# Patient Record
Sex: Female | Born: 1965
Health system: Southern US, Community
[De-identification: ages and names within clinical notes are randomized; demographics above are authoritative.]

## PROBLEM LIST (undated history)

## (undated) DIAGNOSIS — G709 Myoneural disorder, unspecified: Secondary | ICD-10-CM

## (undated) DIAGNOSIS — J302 Other seasonal allergic rhinitis: Secondary | ICD-10-CM

## (undated) DIAGNOSIS — E11311 Type 2 diabetes mellitus with unspecified diabetic retinopathy with macular edema: Secondary | ICD-10-CM

## (undated) DIAGNOSIS — Z950 Presence of cardiac pacemaker: Secondary | ICD-10-CM

## (undated) DIAGNOSIS — Z9889 Other specified postprocedural states: Secondary | ICD-10-CM

## (undated) DIAGNOSIS — D649 Anemia, unspecified: Secondary | ICD-10-CM

## (undated) DIAGNOSIS — I219 Acute myocardial infarction, unspecified: Secondary | ICD-10-CM

## (undated) DIAGNOSIS — T4145XA Adverse effect of unspecified anesthetic, initial encounter: Secondary | ICD-10-CM

## (undated) DIAGNOSIS — K219 Gastro-esophageal reflux disease without esophagitis: Secondary | ICD-10-CM

## (undated) DIAGNOSIS — J189 Pneumonia, unspecified organism: Secondary | ICD-10-CM

## (undated) DIAGNOSIS — I509 Heart failure, unspecified: Secondary | ICD-10-CM

## (undated) DIAGNOSIS — J9601 Acute respiratory failure with hypoxia: Secondary | ICD-10-CM

## (undated) DIAGNOSIS — T8859XA Other complications of anesthesia, initial encounter: Secondary | ICD-10-CM

## (undated) DIAGNOSIS — N179 Acute kidney failure, unspecified: Secondary | ICD-10-CM

## (undated) DIAGNOSIS — I2510 Atherosclerotic heart disease of native coronary artery without angina pectoris: Secondary | ICD-10-CM

## (undated) DIAGNOSIS — I499 Cardiac arrhythmia, unspecified: Secondary | ICD-10-CM

## (undated) DIAGNOSIS — I10 Essential (primary) hypertension: Secondary | ICD-10-CM

## (undated) DIAGNOSIS — R079 Chest pain, unspecified: Secondary | ICD-10-CM

## (undated) HISTORY — PX: OTHER SURGICAL HISTORY: SHX169

## (undated) HISTORY — DX: Other seasonal allergic rhinitis: J30.2

## (undated) HISTORY — PX: SHOULDER ARTHROSCOPY W/ ROTATOR CUFF REPAIR: SHX2400

## (undated) HISTORY — PX: MAZE: SHX5063

## (undated) HISTORY — DX: Gastro-esophageal reflux disease without esophagitis: K21.9

## (undated) HISTORY — PX: KNEE SURGERY: SHX244

---

## 2006-02-02 ENCOUNTER — Other Ambulatory Visit: Admission: RE | Admit: 2006-02-02 | Discharge: 2006-02-02 | Payer: Self-pay | Admitting: Gynecology

## 2006-02-23 ENCOUNTER — Encounter: Admission: RE | Admit: 2006-02-23 | Discharge: 2006-02-23 | Payer: Self-pay | Admitting: Gynecology

## 2007-03-11 ENCOUNTER — Other Ambulatory Visit: Admission: RE | Admit: 2007-03-11 | Discharge: 2007-03-11 | Payer: Self-pay | Admitting: Gynecology

## 2007-09-26 HISTORY — PX: BACK SURGERY: SHX140

## 2008-02-27 ENCOUNTER — Ambulatory Visit (HOSPITAL_COMMUNITY): Admission: RE | Admit: 2008-02-27 | Discharge: 2008-02-28 | Payer: Self-pay | Admitting: Orthopedic Surgery

## 2010-10-19 ENCOUNTER — Other Ambulatory Visit: Payer: Self-pay | Admitting: Gynecology

## 2010-10-19 ENCOUNTER — Ambulatory Visit
Admission: RE | Admit: 2010-10-19 | Discharge: 2010-10-19 | Payer: Self-pay | Source: Home / Self Care | Attending: Gynecology | Admitting: Gynecology

## 2010-10-19 ENCOUNTER — Other Ambulatory Visit
Admission: RE | Admit: 2010-10-19 | Discharge: 2010-10-19 | Payer: Self-pay | Source: Home / Self Care | Admitting: Gynecology

## 2010-12-07 ENCOUNTER — Encounter (INDEPENDENT_AMBULATORY_CARE_PROVIDER_SITE_OTHER): Payer: BC Managed Care – PPO

## 2010-12-07 DIAGNOSIS — Z13820 Encounter for screening for osteoporosis: Secondary | ICD-10-CM

## 2010-12-14 ENCOUNTER — Other Ambulatory Visit (INDEPENDENT_AMBULATORY_CARE_PROVIDER_SITE_OTHER): Payer: BC Managed Care – PPO

## 2010-12-14 DIAGNOSIS — E559 Vitamin D deficiency, unspecified: Secondary | ICD-10-CM

## 2010-12-21 ENCOUNTER — Ambulatory Visit (INDEPENDENT_AMBULATORY_CARE_PROVIDER_SITE_OTHER): Payer: BC Managed Care – PPO | Admitting: Gynecology

## 2010-12-21 DIAGNOSIS — M949 Disorder of cartilage, unspecified: Secondary | ICD-10-CM

## 2010-12-21 DIAGNOSIS — M899 Disorder of bone, unspecified: Secondary | ICD-10-CM

## 2010-12-21 DIAGNOSIS — R5381 Other malaise: Secondary | ICD-10-CM

## 2010-12-21 DIAGNOSIS — R5383 Other fatigue: Secondary | ICD-10-CM

## 2010-12-21 DIAGNOSIS — N951 Menopausal and female climacteric states: Secondary | ICD-10-CM

## 2011-01-09 ENCOUNTER — Other Ambulatory Visit: Payer: BC Managed Care – PPO

## 2011-02-07 NOTE — Op Note (Signed)
NAME:  Colleen [name], Colleen [name]              ACCOUNT NO.:  [id number]   MEDICAL RECORD NO.:  [id number]          PATIENT TYPE:  AMB   LOCATION:  DAY                          FACILITY:  [facility]   PHYSICIAN:  Ronald A. Gioffre, M.D.DATE OF BIRTH:  Oct 27, 1965   DATE OF PROCEDURE:  02/27/2008  DATE OF DISCHARGE:                               OPERATIVE REPORT   SURGEON:  Dr. Darrelyn Hillock.   ASSISTANT:  Dr. Marlowe Kays.   PREOPERATIVE DIAGNOSES:  Large herniated lumbar disk at L4-5 on the left  with a partial foot drop.   POSTOPERATIVE DIAGNOSES:  Large herniated lumbar disk at L4-5 on the  left with a partial foot drop.   OPERATION:  1. Hemilaminectomy and microdiskectomy at L4-5 on the left.  2. Foraminotomy of the L4-L5 roots on the left.   DESCRIPTION OF PROCEDURE:  Under general anesthesia, the patient first  had 1 gm of IV Ancef.  She was placed on the spinal frame.  A routine  orthopedic prep and draping of the back was carried out.  Two needles  were placed in the back for localization purposes.  An x-ray was taken  to verify the position.  An incision then was made over the L4-5 space.  Bleeders were identified and cauterized.  I then stripped the muscle  from the lamina bilaterally at L4-5.  I then took another x-ray with the  instruments in place to verify the exact position.  Once we established  the L4-5 space on the left, I then utilized a curette to curette off the  soft tissue off the lamina of 4-5.  The interlaminar space was  identified.  We then carried out our hemilaminectomy in the usual  fashion.  A microscope was brought in, and then we removed the  ligamentum flavum with great care taken to protect the underlying dura.  We then carried out foraminotomies on the left above and below the space  for 4-5 root.  We identified the L5 root and gently retracted it.  It  was under tension from the herniated disk.  We cauterized the lateral  recess veins in this area.  We then made  a cruciate incision in the disk  space and the disk literally exploded out through the incision site.  We  then removed that large piece of disk, and we then went into the space.  We compressed the remaining herniated disk material from the  subligamentous space.  We utilized the Epstein curettes and a nerve  hook.  We then completed our diskectomy in the usual fashion.  We  explored the area and made sure there were no other fragments, there  were not.  The axillary region of the root was fine.  The root and dura  was freely movable.  We made sure we had nice openings in the foramina  for the 4-5 roots.  We were able to easily pass a hockey-stick out the  foramina of both roots on the left.  We thoroughly irrigated out the  area, removed the fluid, loosely applied some thrombin-  soaked Gelfoam and closed the wound  in layers in the usual fashion.  I  left a small deep proximal part of the wound open for drainage purposes.  The subcu was closed with 0 Vicryl.  The skin was closed with metal  staples.  A sterile Neosporin dressing was applied.  She left the  operative room in satisfactory condition.           ______________________________  Georges Lynch Darrelyn Hillock, M.D.     RAG/MEDQ  D:  02/27/2008  T:  02/27/2008  Job:  [id number]   cc:   Alfonse Alpers. Dagoberto Ligas, M.D.  Fax: [phone]

## 2011-06-22 LAB — DIFFERENTIAL
Basophils Absolute: 0
Basophils Relative: 1
Eosinophils Absolute: 0.1
Lymphocytes Relative: 32
Monocytes Absolute: 0.4
Monocytes Relative: 10

## 2011-06-22 LAB — CBC
HCT: 40.6
Platelets: 172
RBC: 4.72

## 2011-06-22 LAB — APTT: aPTT: 29

## 2011-06-22 LAB — COMPREHENSIVE METABOLIC PANEL
ALT: 22
AST: 21
Albumin: 3.6
Alkaline Phosphatase: 120 — ABNORMAL HIGH
Creatinine, Ser: 0.73
GFR calc Af Amer: >60
GFR calc non Af Amer: >60
Sodium: 137
Total Protein: 5.9 — ABNORMAL LOW

## 2011-06-22 LAB — URINALYSIS, ROUTINE W REFLEX MICROSCOPIC
Ketones, ur: NEGATIVE
Specific Gravity, Urine: 1.011

## 2012-01-01 ENCOUNTER — Other Ambulatory Visit: Payer: Self-pay | Admitting: *Deleted

## 2012-01-05 ENCOUNTER — Encounter: Payer: Self-pay | Admitting: *Deleted

## 2012-01-05 NOTE — Progress Notes (Signed)
Patient ID: Colleen [name], female   DOB: 28-Aug-1966, 46 y.o.   MRN: [id number] Pharmacy faxed a request for 90 day supply of Vagifem .  Called pharmacy and the patient answered.  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  Told her that her annual was overdue since January and did have an appt but we could not approve a 90 day rx.  We could approve #[id number] that was on the refill request but she claims the insurance would only pay for a 90 day supply and she would wait on rx till she see's Dr. Lily Peer for her annual in a couple weeks.

## 2012-01-08 ENCOUNTER — Other Ambulatory Visit: Payer: Self-pay | Admitting: *Deleted

## 2012-01-08 MED ORDER — ESTRADIOL 10 MCG VA TABS: ORAL_TABLET | VAGINAL | Status: DC

## 2012-01-09 DIAGNOSIS — J45909 Unspecified asthma, uncomplicated: Secondary | ICD-10-CM | POA: Insufficient documentation

## 2012-01-17 ENCOUNTER — Encounter: Payer: Self-pay | Admitting: Gynecology

## 2012-01-17 ENCOUNTER — Ambulatory Visit (INDEPENDENT_AMBULATORY_CARE_PROVIDER_SITE_OTHER): Payer: BC Managed Care – PPO | Admitting: Gynecology

## 2012-01-17 VITALS — BP 128/86 | Ht 63.0 in | Wt 156.0 lb

## 2012-01-17 DIAGNOSIS — N952 Postmenopausal atrophic vaginitis: Secondary | ICD-10-CM

## 2012-01-17 DIAGNOSIS — Z01419 Encounter for gynecological examination (general) (routine) without abnormal findings: Secondary | ICD-10-CM

## 2012-01-17 DIAGNOSIS — K219 Gastro-esophageal reflux disease without esophagitis: Secondary | ICD-10-CM | POA: Insufficient documentation

## 2012-01-17 DIAGNOSIS — R634 Abnormal weight loss: Secondary | ICD-10-CM

## 2012-01-17 DIAGNOSIS — E28319 Asymptomatic premature menopause: Secondary | ICD-10-CM | POA: Insufficient documentation

## 2012-01-17 DIAGNOSIS — E8889 Other specified metabolic disorders: Secondary | ICD-10-CM

## 2012-01-17 DIAGNOSIS — N951 Menopausal and female climacteric states: Secondary | ICD-10-CM

## 2012-01-17 DIAGNOSIS — E119 Type 2 diabetes mellitus without complications: Secondary | ICD-10-CM

## 2012-01-17 MED ORDER — ESTRADIOL 10 MCG VA TABS: ORAL_TABLET | VAGINAL | Status: DC

## 2012-01-17 MED ORDER — NONFORMULARY OR COMPOUNDED ITEM: Status: DC

## 2012-01-17 NOTE — Progress Notes (Signed)
Colleen [name] April 02, 1966 [id number]   History:    46 y.o.  for annual exam with history of premature menopause who had been on Vagifem 10 mcg intravaginally 3 times a week. Patient is a type II diabetic is being followed by Dr. Lurene [name]. Her last mammogram was in 2007 and she does her monthly self breast examinations. Review of her record indicates she was weighing 166 is now down to 156. No lab work drawn today since her primary physician has been drawn her lab work.  Past medical history,surgical history, family history and social history were all reviewed and documented in the EPIC chart.  Gynecologic History No LMP recorded. Patient is postmenopausal. Contraception: none Last Pap: 2012. Results were: normal Last mammogram: 2007. Results were: normal  Obstetric History OB History    Grav Para Term Preterm Abortions TAB SAB Ect Mult Living   2 2 2       2      # Outc Date GA Lbr Len/2nd Wgt Sex Del Anes PTL Lv   1 TRM     F CS  No Yes   2 TRM     M CS  No Yes       ROS:  Was performed and pertinent positives and negatives are included in the history.  Exam: chaperone present  BP 128/86  Ht 5\' 3"  (1.6 m)  Wt 156 lb (70.761 kg)  BMI 27.63 kg/m2  Body mass index is 27.63 kg/(m^2).  General appearance : Well developed well nourished female. No acute distress HEENT: Neck supple, trachea midline, no carotid bruits, no thyroidmegaly Lungs: Clear to auscultation, no rhonchi or wheezes, or rib retractions  Heart: Regular rate and rhythm, no murmurs or gallops Breast:Examined in sitting and supine position were symmetrical in appearance, no palpable masses or tenderness,  no skin retraction, no nipple inversion, no nipple discharge, no skin discoloration, no axillary or supraclavicular lymphadenopathy Abdomen: no palpable masses or tenderness, no rebound or guarding Extremities: no edema or skin discoloration or tenderness  Pelvic:  Bartholin, Urethra, Skene Glands: Within normal  limits             Vagina: No gross lesions or discharge, atrophy  Cervix: No gross lesions or discharge  Uterus  anteverted, normal size, shape and consistency, non-tender and mobile  Adnexa  Without masses or tenderness  Anus and perineum  normal   Rectovaginal  normal sphincter tone without palpated masses or tenderness             Hemoccult not done normal rectal exam     Assessment/Plan:  46 y.o. female for annual exam who does not want to be on oral hormone replacement therapy because of her diabetes. We discussed a transdermal route like she had been in the past or vaginal rings with the addition of Prometrium 10 days of the month. We had offered her and prescribed generic estrogen cream (estradiol 0.02%) 1 mL. She'll apply the applicator twice a week which will be made at the compound pharmacy. We'll see how this helps with her symptoms. She was given a requisition to schedule her mammogram which is overdue. She was encouraged to do her monthly self breast examinations. We discussed the new screening guidelines her Pap smears and she will not need 15for 2 more years. We discussed importance of nutrition and exercise and literature information was provided.   Ok Edwards MD, 12:06 PM 01/17/2012

## 2012-01-17 NOTE — Patient Instructions (Signed)
Exercise to Lose Weight Exercise and a healthy diet may help you lose weight. Your doctor may suggest specific exercises. EXERCISE IDEAS AND TIPS  Choose low-cost things you enjoy doing, such as walking, bicycling, or exercising to workout videos.   Take stairs instead of the elevator.   Walk during your lunch break.   Park your car further away from work or school.   Go to a gym or an exercise class.   Start with 5 to 10 minutes of exercise each day. Build up to 30 minutes of exercise 4 to 6 days a week.   Wear shoes with good support and comfortable clothes.   Stretch before and after working out.   Work out until you breathe harder and your heart beats faster.   Drink extra water when you exercise.   Do not do so much that you hurt yourself, feel dizzy, or get very short of breath.  Exercises that burn about 150 calories:  Running 1  miles in 15 minutes.   Playing volleyball for 45 to 60 minutes.   Washing and waxing a car for 45 to 60 minutes.   Playing touch football for 45 minutes.   Walking 1  miles in 35 minutes.   Pushing a stroller 1  miles in 30 minutes.   Playing basketball for 30 minutes.   Raking leaves for 30 minutes.   Bicycling 5 miles in 30 minutes.   Walking 2 miles in 30 minutes.   Dancing for 30 minutes.   Shoveling snow for 15 minutes.   Swimming laps for 20 minutes.   Walking up stairs for 15 minutes.   Bicycling 4 miles in 15 minutes.   Gardening for 30 to 45 minutes.   Jumping rope for 15 minutes.   Washing windows or floors for 45 to 60 minutes.  Document Released: 10/14/2010 Document Revised: 05/24/2011 Document Reviewed: 10/14/2010 Exit[employer] Patient Information 2012 [employer].                                                  Cholesterol Control Diet  Cholesterol levels in your body are determined significantly by your diet. Cholesterol levels may also be related to heart disease. The following material helps to  explain this relationship and discusses what you can do to help keep your heart healthy. Not all cholesterol is bad. Low-density lipoprotein (LDL) cholesterol is the "bad" cholesterol. It may cause fatty deposits to build up inside your arteries. High-density lipoprotein (HDL) cholesterol is "good." It helps to remove the "bad" LDL cholesterol from your blood. Cholesterol is a very important risk factor for heart disease. Other risk factors are high blood pressure, smoking, stress, heredity, and weight. The heart muscle gets its supply of blood through the coronary arteries. If your LDL cholesterol is high and your HDL cholesterol is low, you are at risk for having fatty deposits build up in your coronary arteries. This leaves less room through which blood can flow. Without sufficient blood and oxygen, the heart muscle cannot function properly and you may feel chest pains (angina pectoris). When a coronary artery closes up entirely, a part of the heart muscle may die, causing a heart attack (myocardial infarction). CHECKING CHOLESTEROL When your caregiver sends your blood to a lab to be analyzed for cholesterol, a complete lipid (fat) profile may be done. With   this test, the total amount of cholesterol and levels of LDL and HDL are determined. Triglycerides are a type of fat that circulates in the blood and can also be used to determine heart disease risk. The list below describes what the numbers should be: Test: Total Cholesterol.  Less than 200 mg/dl.  Test: LDL "bad cholesterol."  Less than 100 mg/dl.   Less than 70 mg/dl if you are at very high risk of a heart attack or sudden cardiac death.  Test: HDL "good cholesterol."  Greater than 50 mg/dl for women.   Greater than 40 mg/dl for men.  Test: Triglycerides.  Less than 150 mg/dl.  CONTROLLING CHOLESTEROL WITH DIET Although exercise and lifestyle factors are important, your diet is key. That is because certain foods are known to raise  cholesterol and others to lower it. The goal is to balance foods for their effect on cholesterol and more importantly, to replace saturated and trans fat with other types of fat, such as monounsaturated fat, polyunsaturated fat, and omega-3 fatty acids. On average, a person should consume no more than 15 to 17 g of saturated fat daily. Saturated and trans fats are considered "bad" fats, and they will raise LDL cholesterol. Saturated fats are primarily found in animal products such as meats, butter, and cream. However, that does not mean you need to sacrifice all your favorite foods. Today, there are good tasting, low-fat, low-cholesterol substitutes for most of the things you like to eat. Choose low-fat or nonfat alternatives. Choose round or loin cuts of red meat, since these types of cuts are lowest in fat and cholesterol. Chicken (without the skin), fish, veal, and ground turkey breast are excellent choices. Eliminate fatty meats, such as hot dogs and salami. Even shellfish have little or no saturated fat. Have a 3 oz (85 g) portion when you eat lean meat, poultry, or fish. Trans fats are also called "partially hydrogenated oils." They are oils that have been scientifically manipulated so that they are solid at room temperature resulting in a longer shelf life and improved taste and texture of foods in which they are added. Trans fats are found in stick margarine, some tub margarines, cookies, crackers, and baked goods.  When baking and cooking, oils are an excellent substitute for butter. The monounsaturated oils are especially beneficial since it is believed they lower LDL and raise HDL. The oils you should avoid entirely are saturated tropical oils, such as coconut and palm.  Remember to eat liberally from food groups that are naturally free of saturated and trans fat, including fish, fruit, vegetables, beans, grains (barley, rice, couscous, bulgur wheat), and pasta (without cream sauces).  IDENTIFYING  FOODS THAT LOWER CHOLESTEROL  Soluble fiber may lower your cholesterol. This type of fiber is found in fruits such as apples, vegetables such as broccoli, potatoes, and carrots, legumes such as beans, peas, and lentils, and grains such as barley. Foods fortified with plant sterols (phytosterol) may also lower cholesterol. You should eat at least 2 g per day of these foods for a cholesterol lowering effect.  Read package labels to identify low-saturated fats, trans fats free, and low-fat foods at the supermarket. Select cheeses that have only 2 to 3 g saturated fat per ounce. Use a heart-healthy tub margarine that is free of trans fats or partially hydrogenated oil. When buying baked goods (cookies, crackers), avoid partially hydrogenated oils. Breads and muffins should be made from whole grains (whole-wheat or whole oat flour, instead of "flour" or "  enriched flour"). Buy non-creamy canned soups with reduced salt and no added fats.  FOOD PREPARATION TECHNIQUES  Never deep-fry. If you must fry, either stir-fry, which uses very little fat, or use non-stick cooking sprays. When possible, broil, bake, or roast meats, and steam vegetables. Instead of dressing vegetables with butter or margarine, use lemon and herbs, applesauce and cinnamon (for squash and sweet potatoes), nonfat yogurt, salsa, and low-fat dressings for salads.  LOW-SATURATED FAT / LOW-FAT FOOD SUBSTITUTES Meats / Saturated Fat (g)  Avoid: Steak, marbled (3 oz/85 g) / 11 g   Choose: Steak, lean (3 oz/85 g) / 4 g   Avoid: Hamburger (3 oz/85 g) / 7 g   Choose: Hamburger, lean (3 oz/85 g) / 5 g   Avoid: Ham (3 oz/85 g) / 6 g   Choose: Ham, lean cut (3 oz/85 g) / 2.4 g   Avoid: Chicken, with skin, dark meat (3 oz/85 g) / 4 g   Choose: Chicken, skin removed, dark meat (3 oz/85 g) / 2 g   Avoid: Chicken, with skin, light meat (3 oz/85 g) / 2.5 g   Choose: Chicken, skin removed, light meat (3 oz/85 g) / 1 g  Dairy / Saturated Fat  (g)  Avoid: Whole milk (1 cup) / 5 g   Choose: Low-fat milk, 2% (1 cup) / 3 g   Choose: Low-fat milk, 1% (1 cup) / 1.5 g   Choose: Skim milk (1 cup) / 0.3 g   Avoid: Hard cheese (1 oz/28 g) / 6 g   Choose: Skim milk cheese (1 oz/28 g) / 2 to 3 g   Avoid: Cottage cheese, 4% fat (1 cup) / 6.5 g   Choose: Low-fat cottage cheese, 1% fat (1 cup) / 1.5 g   Avoid: Ice cream (1 cup) / 9 g   Choose: Sherbet (1 cup) / 2.5 g   Choose: Nonfat frozen yogurt (1 cup) / 0.3 g   Choose: Frozen fruit bar / trace   Avoid: Whipped cream (1 tbs) / 3.5 g   Choose: Nondairy whipped topping (1 tbs) / 1 g  Condiments / Saturated Fat (g)  Avoid: Mayonnaise (1 tbs) / 2 g   Choose: Low-fat mayonnaise (1 tbs) / 1 g   Avoid: Butter (1 tbs) / 7 g   Choose: Extra light margarine (1 tbs) / 1 g   Avoid: Coconut oil (1 tbs) / 11.8 g   Choose: Olive oil (1 tbs) / 1.8 g   Choose: Corn oil (1 tbs) / 1.7 g   Choose: Safflower oil (1 tbs) / 1.2 g   Choose: Sunflower oil (1 tbs) / 1.4 g   Choose: Soybean oil (1 tbs) / 2.4 g   Choose: Canola oil (1 tbs) / 1 g  Document Released: 09/11/2005 Document Revised: 05/24/2011 Document Reviewed: 03/02/2011 [employer] Patient Information 2012 [employer].   

## 2013-11-06 ENCOUNTER — Other Ambulatory Visit: Payer: Self-pay | Admitting: Endocrinology

## 2013-11-06 DIAGNOSIS — E049 Nontoxic goiter, unspecified: Secondary | ICD-10-CM

## 2013-11-21 ENCOUNTER — Ambulatory Visit
Admission: RE | Admit: 2013-11-21 | Discharge: 2013-11-21 | Disposition: A | Discharge status: Home / Self Care | Payer: 59 | Source: Ambulatory Visit | Attending: Endocrinology | Admitting: Endocrinology

## 2013-11-21 DIAGNOSIS — E049 Nontoxic goiter, unspecified: Secondary | ICD-10-CM

## 2014-07-27 ENCOUNTER — Encounter: Payer: Self-pay | Admitting: Gynecology

## 2014-09-14 ENCOUNTER — Encounter (HOSPITAL_BASED_OUTPATIENT_CLINIC_OR_DEPARTMENT_OTHER): Payer: 59 | Attending: Plastic Surgery

## 2015-07-08 ENCOUNTER — Other Ambulatory Visit: Payer: Self-pay | Admitting: Podiatry

## 2015-07-08 DIAGNOSIS — E1129 Type 2 diabetes mellitus with other diabetic kidney complication: Secondary | ICD-10-CM

## 2015-07-08 DIAGNOSIS — L97519 Non-pressure chronic ulcer of other part of right foot with unspecified severity: Secondary | ICD-10-CM

## 2015-07-08 DIAGNOSIS — M21961 Unspecified acquired deformity of right lower leg: Secondary | ICD-10-CM

## 2015-07-08 DIAGNOSIS — L03115 Cellulitis of right lower limb: Secondary | ICD-10-CM

## 2015-07-09 ENCOUNTER — Ambulatory Visit
Admission: RE | Admit: 2015-07-09 | Discharge: 2015-07-09 | Disposition: A | Discharge status: Home / Self Care | Payer: 59 | Source: Ambulatory Visit | Attending: Podiatry | Admitting: Podiatry

## 2015-07-09 DIAGNOSIS — E1129 Type 2 diabetes mellitus with other diabetic kidney complication: Secondary | ICD-10-CM

## 2015-07-09 DIAGNOSIS — M21961 Unspecified acquired deformity of right lower leg: Secondary | ICD-10-CM

## 2015-07-09 DIAGNOSIS — L03115 Cellulitis of right lower limb: Secondary | ICD-10-CM

## 2015-07-09 DIAGNOSIS — L97519 Non-pressure chronic ulcer of other part of right foot with unspecified severity: Secondary | ICD-10-CM

## 2015-07-09 MED ORDER — GADOBENATE DIMEGLUMINE 529 MG/ML IV SOLN
10.0000 mL | Freq: Once | INTRAVENOUS | Status: AC | PRN
  Administered 2015-07-09: 10 mL via INTRAVENOUS

## 2016-01-04 ENCOUNTER — Ambulatory Visit (HOSPITAL_COMMUNITY)
Admission: RE | Admit: 2016-01-04 | Discharge: 2016-01-04 | Disposition: A | Discharge status: Home / Self Care | Payer: 59 | Source: Ambulatory Visit | Attending: Podiatry | Admitting: Podiatry

## 2016-01-04 ENCOUNTER — Other Ambulatory Visit (HOSPITAL_COMMUNITY): Payer: Self-pay | Admitting: Podiatry

## 2016-01-04 DIAGNOSIS — E119 Type 2 diabetes mellitus without complications: Secondary | ICD-10-CM | POA: Insufficient documentation

## 2016-01-04 DIAGNOSIS — K219 Gastro-esophageal reflux disease without esophagitis: Secondary | ICD-10-CM | POA: Insufficient documentation

## 2016-01-04 DIAGNOSIS — R609 Edema, unspecified: Secondary | ICD-10-CM

## 2016-01-04 DIAGNOSIS — J45909 Unspecified asthma, uncomplicated: Secondary | ICD-10-CM | POA: Insufficient documentation

## 2016-01-04 NOTE — Progress Notes (Signed)
*  PRELIMINARY RESULTS* Vascular Ultrasound Left lower extremity venous duplex has been completed.  Preliminary findings: No evidence of DVT or baker's cyst.   Called results to [facility]    Landry Mellow, RDMS, RVT  01/04/2016, 4:33 PM

## 2016-01-18 ENCOUNTER — Other Ambulatory Visit: Payer: Self-pay | Admitting: Orthopedic Surgery

## 2016-01-18 DIAGNOSIS — S82842A Displaced bimalleolar fracture of left lower leg, initial encounter for closed fracture: Secondary | ICD-10-CM

## 2016-01-19 ENCOUNTER — Ambulatory Visit
Admission: RE | Admit: 2016-01-19 | Discharge: 2016-01-19 | Disposition: A | Discharge status: Home / Self Care | Payer: 59 | Source: Ambulatory Visit | Attending: Orthopedic Surgery | Admitting: Orthopedic Surgery

## 2016-01-19 DIAGNOSIS — S82842A Displaced bimalleolar fracture of left lower leg, initial encounter for closed fracture: Secondary | ICD-10-CM

## 2016-02-29 ENCOUNTER — Other Ambulatory Visit: Payer: Self-pay | Admitting: Endocrinology

## 2016-02-29 DIAGNOSIS — E559 Vitamin D deficiency, unspecified: Secondary | ICD-10-CM

## 2016-02-29 DIAGNOSIS — Z1231 Encounter for screening mammogram for malignant neoplasm of breast: Secondary | ICD-10-CM

## 2016-03-08 ENCOUNTER — Other Ambulatory Visit: Payer: Self-pay | Admitting: Orthopedic Surgery

## 2016-03-15 ENCOUNTER — Ambulatory Visit
Admission: RE | Admit: 2016-03-15 | Discharge: 2016-03-15 | Disposition: A | Discharge status: Home / Self Care | Payer: 59 | Source: Ambulatory Visit | Attending: Endocrinology | Admitting: Endocrinology

## 2016-03-15 DIAGNOSIS — Z1231 Encounter for screening mammogram for malignant neoplasm of breast: Secondary | ICD-10-CM

## 2016-03-15 DIAGNOSIS — E559 Vitamin D deficiency, unspecified: Secondary | ICD-10-CM

## 2016-03-17 ENCOUNTER — Encounter (HOSPITAL_BASED_OUTPATIENT_CLINIC_OR_DEPARTMENT_OTHER): Payer: Self-pay | Admitting: *Deleted

## 2016-03-17 NOTE — Progress Notes (Addendum)
Coming Tuesday for BMET and  EKG. Requested EKG from [facility]. Bring all medications.

## 2016-03-17 NOTE — Progress Notes (Signed)
EKG from 03/23/15 from PCP office received and placed on chart.

## 2016-03-20 ENCOUNTER — Encounter (HOSPITAL_BASED_OUTPATIENT_CLINIC_OR_DEPARTMENT_OTHER): Payer: Self-pay | Admitting: *Deleted

## 2016-03-20 NOTE — Progress Notes (Signed)
Spoke with Dr. 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

## 2016-03-21 ENCOUNTER — Encounter (HOSPITAL_BASED_OUTPATIENT_CLINIC_OR_DEPARTMENT_OTHER)
Admission: RE | Admit: 2016-03-21 | Discharge: 2016-03-21 | Disposition: A | Discharge status: Home / Self Care | Payer: 59 | Source: Ambulatory Visit | Attending: Orthopedic Surgery | Admitting: Orthopedic Surgery

## 2016-03-21 DIAGNOSIS — Z6832 Body mass index (BMI) 32.0-32.9, adult: Secondary | ICD-10-CM | POA: Diagnosis not present

## 2016-03-21 DIAGNOSIS — S82202A Unspecified fracture of shaft of left tibia, initial encounter for closed fracture: Secondary | ICD-10-CM | POA: Diagnosis not present

## 2016-03-21 DIAGNOSIS — E11311 Type 2 diabetes mellitus with unspecified diabetic retinopathy with macular edema: Secondary | ICD-10-CM | POA: Diagnosis not present

## 2016-03-21 DIAGNOSIS — Z87891 Personal history of nicotine dependence: Secondary | ICD-10-CM | POA: Diagnosis not present

## 2016-03-21 DIAGNOSIS — Z794 Long term (current) use of insulin: Secondary | ICD-10-CM | POA: Diagnosis not present

## 2016-03-21 DIAGNOSIS — X58XXXA Exposure to other specified factors, initial encounter: Secondary | ICD-10-CM | POA: Diagnosis not present

## 2016-03-21 DIAGNOSIS — Z7982 Long term (current) use of aspirin: Secondary | ICD-10-CM | POA: Diagnosis not present

## 2016-03-21 DIAGNOSIS — Z79899 Other long term (current) drug therapy: Secondary | ICD-10-CM | POA: Diagnosis not present

## 2016-03-21 DIAGNOSIS — S82402A Unspecified fracture of shaft of left fibula, initial encounter for closed fracture: Secondary | ICD-10-CM | POA: Diagnosis not present

## 2016-03-21 DIAGNOSIS — J45909 Unspecified asthma, uncomplicated: Secondary | ICD-10-CM | POA: Diagnosis not present

## 2016-03-21 DIAGNOSIS — E1142 Type 2 diabetes mellitus with diabetic polyneuropathy: Secondary | ICD-10-CM | POA: Diagnosis not present

## 2016-03-21 LAB — BASIC METABOLIC PANEL
Anion gap: 4 — ABNORMAL LOW (ref 5–15)
BUN: 25 mg/dL — AB (ref 6–20)
CALCIUM: 9.1 mg/dL (ref 8.9–10.3)
CO2: 26 mmol/L (ref 22–32)
CREATININE: 0.84 mg/dL (ref 0.44–1.00)
Chloride: 105 mmol/L (ref 101–111)
GFR calc non Af Amer: >60 mL/min (ref >60)
Glucose, Bld: 106 mg/dL — ABNORMAL HIGH (ref 65–99)
Potassium: 5.3 mmol/L — ABNORMAL HIGH (ref 3.5–5.1)
SODIUM: 135 mmol/L (ref 135–145)

## 2016-03-23 ENCOUNTER — Ambulatory Visit (HOSPITAL_BASED_OUTPATIENT_CLINIC_OR_DEPARTMENT_OTHER): Payer: 59 | Admitting: Anesthesiology

## 2016-03-23 ENCOUNTER — Encounter (HOSPITAL_BASED_OUTPATIENT_CLINIC_OR_DEPARTMENT_OTHER)
Admission: RE | Disposition: A | Discharge status: Home / Self Care | Payer: Self-pay | Source: Ambulatory Visit | Attending: Orthopedic Surgery

## 2016-03-23 ENCOUNTER — Ambulatory Visit (HOSPITAL_BASED_OUTPATIENT_CLINIC_OR_DEPARTMENT_OTHER)
Admission: RE | Admit: 2016-03-23 | Discharge: 2016-03-23 | Disposition: A | Discharge status: Home / Self Care | Payer: 59 | Source: Ambulatory Visit | Attending: Orthopedic Surgery | Admitting: Orthopedic Surgery

## 2016-03-23 ENCOUNTER — Encounter (HOSPITAL_BASED_OUTPATIENT_CLINIC_OR_DEPARTMENT_OTHER): Payer: Self-pay | Admitting: Anesthesiology

## 2016-03-23 DIAGNOSIS — S82202A Unspecified fracture of shaft of left tibia, initial encounter for closed fracture: Secondary | ICD-10-CM | POA: Diagnosis not present

## 2016-03-23 DIAGNOSIS — Z6832 Body mass index (BMI) 32.0-32.9, adult: Secondary | ICD-10-CM | POA: Insufficient documentation

## 2016-03-23 DIAGNOSIS — E1142 Type 2 diabetes mellitus with diabetic polyneuropathy: Secondary | ICD-10-CM | POA: Insufficient documentation

## 2016-03-23 DIAGNOSIS — Z79899 Other long term (current) drug therapy: Secondary | ICD-10-CM | POA: Insufficient documentation

## 2016-03-23 DIAGNOSIS — J45909 Unspecified asthma, uncomplicated: Secondary | ICD-10-CM | POA: Insufficient documentation

## 2016-03-23 DIAGNOSIS — Z794 Long term (current) use of insulin: Secondary | ICD-10-CM | POA: Insufficient documentation

## 2016-03-23 DIAGNOSIS — Z87891 Personal history of nicotine dependence: Secondary | ICD-10-CM | POA: Insufficient documentation

## 2016-03-23 DIAGNOSIS — S82402A Unspecified fracture of shaft of left fibula, initial encounter for closed fracture: Secondary | ICD-10-CM | POA: Insufficient documentation

## 2016-03-23 DIAGNOSIS — E11311 Type 2 diabetes mellitus with unspecified diabetic retinopathy with macular edema: Secondary | ICD-10-CM | POA: Insufficient documentation

## 2016-03-23 DIAGNOSIS — X58XXXA Exposure to other specified factors, initial encounter: Secondary | ICD-10-CM | POA: Insufficient documentation

## 2016-03-23 DIAGNOSIS — Z7982 Long term (current) use of aspirin: Secondary | ICD-10-CM | POA: Insufficient documentation

## 2016-03-23 HISTORY — DX: Type 2 diabetes mellitus with unspecified diabetic retinopathy with macular edema: E11.311

## 2016-03-23 HISTORY — DX: Adverse effect of unspecified anesthetic, initial encounter: T41.45XA

## 2016-03-23 HISTORY — DX: Other complications of anesthesia, initial encounter: T88.59XA

## 2016-03-23 HISTORY — PX: ORIF ANKLE FRACTURE: SHX5408

## 2016-03-23 LAB — GLUCOSE, CAPILLARY
GLUCOSE-CAPILLARY: 343 mg/dL — AB (ref 65–99)
GLUCOSE-CAPILLARY: 416 mg/dL — AB (ref 65–99)
Glucose-Capillary: 190 mg/dL — ABNORMAL HIGH (ref 65–99)
Glucose-Capillary: 364 mg/dL — ABNORMAL HIGH (ref 65–99)
Glucose-Capillary: 400 mg/dL — ABNORMAL HIGH (ref 65–99)

## 2016-03-23 SURGERY — OPEN REDUCTION INTERNAL FIXATION (ORIF) ANKLE FRACTURE
Anesthesia: General | Site: Ankle | Laterality: Left

## 2016-03-23 MED ORDER — FENTANYL CITRATE (PF) 100 MCG/2ML IJ SOLN
INTRAMUSCULAR | Status: AC
  Filled 2016-03-23: qty 2

## 2016-03-23 MED ORDER — MIDAZOLAM HCL 2 MG/2ML IJ SOLN
1.0000 mg | INTRAMUSCULAR | Status: DC | PRN
  Administered 2016-03-23: 2 mg via INTRAVENOUS

## 2016-03-23 MED ORDER — ASPIRIN EC 81 MG PO TBEC: 81.0000 mg | DELAYED_RELEASE_TABLET | Freq: Two times a day (BID) | ORAL | Status: DC

## 2016-03-23 MED ORDER — SCOPOLAMINE 1 MG/3DAYS TD PT72: 1.0000 | MEDICATED_PATCH | Freq: Once | TRANSDERMAL | Status: DC | PRN

## 2016-03-23 MED ORDER — LIDOCAINE HCL (CARDIAC) 20 MG/ML IV SOLN
INTRAVENOUS | Status: DC | PRN
  Administered 2016-03-23: 80 mg via INTRAVENOUS

## 2016-03-23 MED ORDER — FENTANYL CITRATE (PF) 100 MCG/2ML IJ SOLN
50.0000 ug | INTRAMUSCULAR | Status: AC | PRN
  Administered 2016-03-23 (×3): 25 ug via INTRAVENOUS
  Administered 2016-03-23: 100 ug via INTRAVENOUS
  Administered 2016-03-23: 25 ug via INTRAVENOUS

## 2016-03-23 MED ORDER — BUPIVACAINE-EPINEPHRINE (PF) 0.5% -1:200000 IJ SOLN
INTRAMUSCULAR | Status: DC | PRN
  Administered 2016-03-23 (×2): 10 mL via PERINEURAL

## 2016-03-23 MED ORDER — ROPIVACAINE HCL 5 MG/ML IJ SOLN
INTRAMUSCULAR | Status: DC | PRN
  Administered 2016-03-23 (×2): 10 mL via PERINEURAL

## 2016-03-23 MED ORDER — CHLORHEXIDINE GLUCONATE 4 % EX LIQD: 60.0000 mL | Freq: Once | CUTANEOUS | Status: DC

## 2016-03-23 MED ORDER — ONDANSETRON HCL 4 MG/2ML IJ SOLN
INTRAMUSCULAR | Status: AC
  Filled 2016-03-23: qty 2

## 2016-03-23 MED ORDER — OXYCODONE HCL 5 MG PO TABS: 5.0000 mg | ORAL_TABLET | Freq: Once | ORAL | Status: DC | PRN

## 2016-03-23 MED ORDER — SENNA 8.6 MG PO TABS: 2.0000 | ORAL_TABLET | Freq: Two times a day (BID) | ORAL | Status: DC

## 2016-03-23 MED ORDER — VANCOMYCIN HCL 500 MG IV SOLR
INTRAVENOUS | Status: DC | PRN
  Administered 2016-03-23: 500 mg via TOPICAL

## 2016-03-23 MED ORDER — GLYCOPYRROLATE 0.2 MG/ML IJ SOLN: 0.2000 mg | Freq: Once | INTRAMUSCULAR | Status: DC | PRN

## 2016-03-23 MED ORDER — SODIUM CHLORIDE 0.9 % IV SOLN
INTRAVENOUS | Status: DC
  Administered 2016-03-23: 12:00:00 via INTRAVENOUS

## 2016-03-23 MED ORDER — OXYCODONE HCL 5 MG PO TABS: 5.0000 mg | ORAL_TABLET | ORAL | Status: DC | PRN

## 2016-03-23 MED ORDER — PROPOFOL 500 MG/50ML IV EMUL
INTRAVENOUS | Status: AC
  Filled 2016-03-23: qty 50

## 2016-03-23 MED ORDER — CEFAZOLIN SODIUM-DEXTROSE 2-4 GM/100ML-% IV SOLN
2.0000 g | INTRAVENOUS | Status: AC
  Administered 2016-03-23: 2 g via INTRAVENOUS

## 2016-03-23 MED ORDER — DEXAMETHASONE SODIUM PHOSPHATE 10 MG/ML IJ SOLN
INTRAMUSCULAR | Status: DC | PRN
  Administered 2016-03-23: 5 mg via INTRAVENOUS

## 2016-03-23 MED ORDER — ONDANSETRON HCL 4 MG/2ML IJ SOLN
INTRAMUSCULAR | Status: DC | PRN
  Administered 2016-03-23: 4 mg via INTRAVENOUS

## 2016-03-23 MED ORDER — EPHEDRINE SULFATE 50 MG/ML IJ SOLN
INTRAMUSCULAR | Status: DC | PRN
  Administered 2016-03-23 (×2): 10 mg via INTRAVENOUS

## 2016-03-23 MED ORDER — INSULIN ASPART 100 UNIT/ML ~~LOC~~ SOLN
8.0000 [IU] | Freq: Once | SUBCUTANEOUS | Status: AC
  Administered 2016-03-23: 8 [IU] via SUBCUTANEOUS

## 2016-03-23 MED ORDER — HYDROMORPHONE HCL 1 MG/ML IJ SOLN: 0.2500 mg | INTRAMUSCULAR | Status: DC | PRN

## 2016-03-23 MED ORDER — DEXAMETHASONE SODIUM PHOSPHATE 10 MG/ML IJ SOLN
INTRAMUSCULAR | Status: AC
  Filled 2016-03-23: qty 1

## 2016-03-23 MED ORDER — LIDOCAINE 2% (20 MG/ML) 5 ML SYRINGE
INTRAMUSCULAR | Status: AC
  Filled 2016-03-23: qty 5

## 2016-03-23 MED ORDER — DOCUSATE SODIUM 100 MG PO CAPS: 100.0000 mg | ORAL_CAPSULE | Freq: Two times a day (BID) | ORAL | Status: DC

## 2016-03-23 MED ORDER — VANCOMYCIN HCL 500 MG IV SOLR
INTRAVENOUS | Status: AC
  Filled 2016-03-23: qty 500

## 2016-03-23 MED ORDER — MIDAZOLAM HCL 2 MG/2ML IJ SOLN
INTRAMUSCULAR | Status: AC
  Filled 2016-03-23: qty 2

## 2016-03-23 MED ORDER — MEPERIDINE HCL 25 MG/ML IJ SOLN: 6.2500 mg | INTRAMUSCULAR | Status: DC | PRN

## 2016-03-23 MED ORDER — 0.9 % SODIUM CHLORIDE (POUR BTL) OPTIME
TOPICAL | Status: DC | PRN
  Administered 2016-03-23: 300 mL

## 2016-03-23 MED ORDER — CEFAZOLIN SODIUM-DEXTROSE 2-4 GM/100ML-% IV SOLN
INTRAVENOUS | Status: AC
  Filled 2016-03-23: qty 100

## 2016-03-23 MED ORDER — PROPOFOL 10 MG/ML IV BOLUS
INTRAVENOUS | Status: DC | PRN
  Administered 2016-03-23: 10 mg via INTRAVENOUS
  Administered 2016-03-23: 200 mg via INTRAVENOUS
  Administered 2016-03-23: 10 mg via INTRAVENOUS

## 2016-03-23 MED ORDER — OXYCODONE HCL 5 MG/5ML PO SOLN: 5.0000 mg | Freq: Once | ORAL | Status: DC | PRN

## 2016-03-23 MED ORDER — LACTATED RINGERS IV SOLN
INTRAVENOUS | Status: DC
  Administered 2016-03-23 (×2): via INTRAVENOUS

## 2016-03-23 SURGICAL SUPPLY — 95 items
BANDAGE ESMARK 6X9 LF (GAUZE/BANDAGES/DRESSINGS) ×1 IMPLANT
BIT DRILL 2.5X2.75 QC CALB (BIT) ×2 IMPLANT
BIT DRILL 3.5X5.5 QC CALB (BIT) ×2 IMPLANT
BIT DRILL CALIBRATED 2.7 (BIT) ×2 IMPLANT
BLADE LONG MED 25X9 (BLADE) ×2 IMPLANT
BLADE SURG 15 STRL LF DISP TIS (BLADE) ×3 IMPLANT
BLADE SURG 15 STRL SS (BLADE) ×6
BNDG CMPR 9X4 STRL LF SNTH (GAUZE/BANDAGES/DRESSINGS)
BNDG COHESIVE 4X5 TAN STRL (GAUZE/BANDAGES/DRESSINGS) ×2 IMPLANT
BNDG COHESIVE 6X5 TAN STRL LF (GAUZE/BANDAGES/DRESSINGS) ×2 IMPLANT
BNDG ESMARK 4X9 LF (GAUZE/BANDAGES/DRESSINGS) IMPLANT
BNDG ESMARK 6X9 LF (GAUZE/BANDAGES/DRESSINGS) ×2
CANISTER SUCT 1200ML W/VALVE (MISCELLANEOUS) ×2 IMPLANT
CHLORAPREP W/TINT 26ML (MISCELLANEOUS) ×2 IMPLANT
COVER BACK TABLE 60X90IN (DRAPES) ×2 IMPLANT
CUFF TOURNIQUET SINGLE 34IN LL (TOURNIQUET CUFF) ×2 IMPLANT
DECANTER SPIKE VIAL GLASS SM (MISCELLANEOUS) IMPLANT
DRAPE EXTREMITY T 121X128X90 (DRAPE) ×2 IMPLANT
DRAPE OEC MINIVIEW 54X84 (DRAPES) ×2 IMPLANT
DRAPE U-SHAPE 47X51 STRL (DRAPES) ×2 IMPLANT
DRSG MEPITEL 4X7.2 (GAUZE/BANDAGES/DRESSINGS) ×2 IMPLANT
DRSG PAD ABDOMINAL 8X10 ST (GAUZE/BANDAGES/DRESSINGS) ×4 IMPLANT
ELECT REM PT RETURN 9FT ADLT (ELECTROSURGICAL) ×2
ELECTRODE REM PT RTRN 9FT ADLT (ELECTROSURGICAL) ×1 IMPLANT
GAUZE SPONGE 4X4 12PLY STRL (GAUZE/BANDAGES/DRESSINGS) ×2 IMPLANT
GLOVE BIO SURGEON STRL SZ8 (GLOVE) ×2 IMPLANT
GLOVE BIOGEL PI IND STRL 7.0 (GLOVE) ×2 IMPLANT
GLOVE BIOGEL PI IND STRL 8 (GLOVE) ×2 IMPLANT
GLOVE BIOGEL PI INDICATOR 7.0 (GLOVE) ×2
GLOVE BIOGEL PI INDICATOR 8 (GLOVE) ×2
GLOVE ECLIPSE 6.5 STRL STRAW (GLOVE) ×2 IMPLANT
GLOVE ECLIPSE 7.5 STRL STRAW (GLOVE) ×2 IMPLANT
GLOVE EXAM NITRILE MD LF STRL (GLOVE) IMPLANT
GOWN STRL REUS W/ TWL LRG LVL3 (GOWN DISPOSABLE) ×1 IMPLANT
GOWN STRL REUS W/ TWL XL LVL3 (GOWN DISPOSABLE) ×2 IMPLANT
GOWN STRL REUS W/TWL LRG LVL3 (GOWN DISPOSABLE) ×2
GOWN STRL REUS W/TWL XL LVL3 (GOWN DISPOSABLE) ×4
K-WIRE ACE 1.6X6 (WIRE) ×6
KWIRE ACE 1.6X6 (WIRE) ×3 IMPLANT
NEEDLE HYPO 22GX1.5 SAFETY (NEEDLE) IMPLANT
NS IRRIG 1000ML POUR BTL (IV SOLUTION) ×2 IMPLANT
PACK BASIN DAY SURGERY FS (CUSTOM PROCEDURE TRAY) ×2 IMPLANT
PAD CAST 4YDX4 CTTN HI CHSV (CAST SUPPLIES) ×1 IMPLANT
PADDING CAST ABS 4INX4YD NS (CAST SUPPLIES)
PADDING CAST ABS COTTON 4X4 ST (CAST SUPPLIES) IMPLANT
PADDING CAST COTTON 4X4 STRL (CAST SUPPLIES) ×1
PADDING CAST COTTON 6X4 STRL (CAST SUPPLIES) ×2 IMPLANT
PENCIL BUTTON HOLSTER BLD 10FT (ELECTRODE) ×2 IMPLANT
PLATE ACE 100DEG 7HOLE (Plate) ×2 IMPLANT
PLATE TIB LT MEDIAL 6H (Plate) ×2 IMPLANT
SANITIZER HAND PURELL 535ML FO (MISCELLANEOUS) ×2 IMPLANT
SCREW CORT 3.5X32 (Screw) ×2 IMPLANT
SCREW CORT T15 32X3.5XST LCK (Screw) ×1 IMPLANT
SCREW CORTICAL 3.5MM  28MM (Screw) ×1 IMPLANT
SCREW CORTICAL 3.5MM  30MM (Screw) ×2 IMPLANT
SCREW CORTICAL 3.5MM  42MM (Screw) ×1 IMPLANT
SCREW CORTICAL 3.5MM  44MM (Screw) ×2 IMPLANT
SCREW CORTICAL 3.5MM  46MM (Screw) ×1 IMPLANT
SCREW CORTICAL 3.5MM 28MM (Screw) ×1 IMPLANT
SCREW CORTICAL 3.5MM 30MM (Screw) ×2 IMPLANT
SCREW CORTICAL 3.5MM 40MM (Screw) ×2 IMPLANT
SCREW CORTICAL 3.5MM 42MM (Screw) ×1 IMPLANT
SCREW CORTICAL 3.5MM 44MM (Screw) ×2 IMPLANT
SCREW CORTICAL 3.5MM 46MM (Screw) ×1 IMPLANT
SCREW CORTICAL 3.5MM 50MM (Screw) ×2 IMPLANT
SCREW CORTICAL LOW PROF 3.5X20 (Screw) ×2 IMPLANT
SCREW LOCK CORT STAR 3.5X18 (Screw) ×4 IMPLANT
SCREW LOCK CORT STAR 3.5X28 (Screw) ×2 IMPLANT
SCREW LOCK CORT STAR 3.5X36 (Screw) ×2 IMPLANT
SCREW LOCK CORT STAR 3.5X40 (Screw) ×6 IMPLANT
SCREW LOW PROFILE 22MMX3.5MM (Screw) ×2 IMPLANT
SCREW NLOCK CANC HEX 4X20 (Screw) ×2 IMPLANT
SCREW NLOCK CANC HEX 4X20 FIB (Screw) ×1 IMPLANT
SCREW NLOCK CANC HEX 4X22 (Screw) ×2 IMPLANT
SHEET MEDIUM DRAPE 40X70 STRL (DRAPES) ×2 IMPLANT
SLEEVE SCD COMPRESS KNEE MED (MISCELLANEOUS) ×2 IMPLANT
SPLINT FAST PLASTER 5X30 (CAST SUPPLIES) ×20
SPLINT PLASTER CAST FAST 5X30 (CAST SUPPLIES) ×20 IMPLANT
SPONGE LAP 18X18 X RAY DECT (DISPOSABLE) ×2 IMPLANT
STOCKINETTE 6  STRL (DRAPES) ×1
STOCKINETTE 6 STRL (DRAPES) ×1 IMPLANT
SUCTION FRAZIER HANDLE 10FR (MISCELLANEOUS) ×1
SUCTION TUBE FRAZIER 10FR DISP (MISCELLANEOUS) ×1 IMPLANT
SUT ETHILON 3 0 PS 1 (SUTURE) ×6 IMPLANT
SUT FIBERWIRE #2 38 T-5 BLUE (SUTURE)
SUT MNCRL AB 3-0 PS2 18 (SUTURE) ×4 IMPLANT
SUT VIC AB 0 SH 27 (SUTURE) IMPLANT
SUT VIC AB 2-0 SH 27 (SUTURE) ×6
SUT VIC AB 2-0 SH 27XBRD (SUTURE) ×3 IMPLANT
SUTURE FIBERWR #2 38 T-5 BLUE (SUTURE) IMPLANT
SYR BULB 3OZ (MISCELLANEOUS) ×2 IMPLANT
SYR CONTROL 10ML LL (SYRINGE) IMPLANT
TOWEL OR 17X24 6PK STRL BLUE (TOWEL DISPOSABLE) ×6 IMPLANT
TUBE CONNECTING 20X1/4 (TUBING) ×2 IMPLANT
UNDERPAD 30X30 (UNDERPADS AND DIAPERS) ×2 IMPLANT

## 2016-03-23 NOTE — Anesthesia Preprocedure Evaluation (Addendum)
Anesthesia Evaluation  Patient identified by MRN, date of birth, ID band Patient awake    Reviewed: Allergy & Precautions, NPO status , Patient's Chart, lab work & pertinent test results  History of Anesthesia Complications (+) PSEUDOCHOLINESTERASE DEFICIENCY and history of anesthetic complications  Airway Mallampati: I  TM Distance: >3 FB Neck ROM: Full    Dental  (+) Teeth Intact, Dental Advisory Given   Pulmonary asthma , former smoker,    breath sounds clear to auscultation       Cardiovascular negative cardio ROS   Rhythm:Regular Rate:Normal     Neuro/Psych  Neuromuscular disease    GI/Hepatic   Endo/Other  diabetes, Well Controlled, Type 1, Insulin DependentMorbid obesity  Renal/GU      Musculoskeletal   Abdominal   Peds  Hematology   Anesthesia Other Findings   Reproductive/Obstetrics                           Anesthesia Physical Anesthesia Plan  ASA: III  Anesthesia Plan: General   Post-op Pain Management: GA combined w/ Regional for post-op pain   Induction: Intravenous  Airway Management Planned: LMA  Additional Equipment:   Intra-op Plan:   Post-operative Plan: Extubation in OR  Informed Consent: I have reviewed the patients History and Physical, chart, labs and discussed the procedure including the risks, benefits and alternatives for the proposed anesthesia with the patient or authorized representative who has indicated his/her understanding and acceptance.   Dental advisory given  Plan Discussed with: CRNA, Anesthesiologist and Surgeon  Anesthesia Plan Comments:         Anesthesia Quick Evaluation

## 2016-03-23 NOTE — Transfer of Care (Signed)
Immediate Anesthesia Transfer of Care Note  Patient: Colleen [name]  Procedure(s) Performed: Procedure(s): OPEN REDUCTION INTERNAL FIXATION (ORIF) ANKLE FRACTURE MALLEOLUS  MALUNION WITH TIBIAL AND FIBULAR OSTEOTOMY AND AUTOGRAFT (Left)  Patient Location: PACU  Anesthesia Type:GA combined with regional for post-op pain  Level of Consciousness: sedated  Airway & Oxygen Therapy: Patient Spontanous Breathing and Patient connected to face mask oxygen  Post-op Assessment: Report given to RN and Post -op Vital signs reviewed and stable  Post vital signs: Reviewed and stable  Last Vitals:  Filed Vitals:   03/23/16 0725 03/23/16 0730  BP:  158/84  Pulse: 72 75  Temp:    Resp: 15 10    Last Pain: There were no vitals filed for this visit.       Complications: No apparent anesthesia complications

## 2016-03-23 NOTE — Anesthesia Postprocedure Evaluation (Signed)
Anesthesia Post Note  Patient: Aliana A Golberg  Procedure(s) Performed: Procedure(s) (LRB): OPEN REDUCTION INTERNAL FIXATION (ORIF) ANKLE FRACTURE MALLEOLUS  MALUNION WITH TIBIAL AND FIBULAR OSTEOTOMY AND AUTOGRAFT (Left)  Patient location during evaluation: PACU Anesthesia Type: General Level of consciousness: awake and alert Pain management: pain level controlled Vital Signs Assessment: post-procedure vital signs reviewed and stable Respiratory status: spontaneous breathing, nonlabored ventilation and respiratory function stable Cardiovascular status: blood pressure returned to baseline and stable Postop Assessment: no signs of nausea or vomiting Anesthetic complications: no    Last Vitals:  Filed Vitals:   03/23/16 1100 03/23/16 1115  BP: 131/71 134/55  Pulse: 82 83  Temp:    Resp: 16 14    Last Pain: There were no vitals filed for this visit.               Riyanshi Wahab A

## 2016-03-23 NOTE — H&P (Signed)
Colleen [name] is an 50 y.o. female.   Chief Complaint: left ankle pain HPI: 50 y/o female with PMH of IDDM presented about three months ago with a bimal fracture of the left ankle.  She has peripheral neuropathy and didn't recall any injury.  I diagnosed Charcot neuroarthropathy.  She now has varus malalignment of the ankle and abundant healing.  She presents today for tibial and fibular osteotomy to re align the ankle malunion.  Her HgbA1C is now less than 7.  Past Medical History  Diagnosis Date  . Diabetes mellitus     TYPE II  . Asthma   . Seasonal allergies   . Diabetic macular edema (HCC)     Pt gets injections in eyes with Eyelea  . Complication of anesthesia     Psuedocholinerasterase deficiency per pt    Past Surgical History  Procedure Laterality Date  . Cataract surgery      BILATERAL  . Back surgery  2009  . Cesarean section      X2    Family History  Problem Relation Age of Onset  . Hypertension Father   . Hypertension Paternal Aunt   . Hypertension Paternal Grandmother   . Hypertension Paternal Grandfather    Social History:  reports that she quit smoking about 29 years ago. She has never used smokeless tobacco. She reports that she drinks alcohol. She reports that she does not use illicit drugs.  Allergies: No Known Allergies  Medications Prior to Admission  Medication Sig Dispense Refill  . aspirin 81 MG tablet Take 81 mg by mouth daily.    . Cholecalciferol (VITAMIN D) 2000 units CAPS Take by mouth.    . Insulin Aspart (NOVOLOG FLEXPEN Ryegate) Inject into the skin. Sliding scale    . insulin NPH Human (HUMULIN N,NOVOLIN N) 100 UNIT/ML injection Inject 15 Units into the skin 2 (two) times daily.    Marland Kitchen lisinopril (PRINIVIL,ZESTRIL) 2.5 MG tablet Take 2.5 mg by mouth daily.    Marland Kitchen loratadine (CLARITIN) 10 MG tablet Take 10 mg by mouth daily.    . ALBUTEROL SULFATE IN Inhale into the lungs.      Results for orders placed or performed during the hospital  encounter of 03/23/16 (from the past 48 hour(s))  Basic metabolic panel     Status: Abnormal   Collection Time: 03/21/16 11:55 AM  Result Value Ref Range   Sodium 135 135 - 145 mmol/L   Potassium 5.3 (H) 3.5 - 5.1 mmol/L   Chloride 105 101 - 111 mmol/L   CO2 26 22 - 32 mmol/L   Glucose, Bld 106 (H) 65 - 99 mg/dL   BUN 25 (H) 6 - 20 mg/dL   Creatinine, Ser 0.84 0.44 - 1.00 mg/dL   Calcium 9.1 8.9 - 10.3 mg/dL   GFR calc non Af Amer >60 >60 mL/min   GFR calc Af Amer >60 >60 mL/min    Comment: (NOTE) The eGFR has been calculated using the CKD EPI equation. This calculation has not been validated in all clinical situations. eGFR's persistently <60 mL/min signify possible Chronic Kidney Disease.    Anion gap 4 (L) 5 - 15  Glucose, capillary     Status: Abnormal   Collection Time: 03/23/16  7:05 AM  Result Value Ref Range   Glucose-Capillary 190 (H) 65 - 99 mg/dL   No results found.  ROS  No recent f/c/n/v/wt loss   Blood pressure 177/76, pulse 73, temperature 97.9 F (36.6 C), temperature source  Oral, resp. rate 16, height '5\' 2"'  (1.575 m), weight 79.379 kg (175 lb), SpO2 100 %. Physical Exam  [age] wd woman in nad.  A and O x 4.  Mood and affect normal.  EOMI.  resp unlabored.  L ankle with varus malalignment.  Skin heatlhy and intact.  No lympahdenopathy.  5/5 strength in PF and DF of the toes.  Diminished sens to LT at the forefoot.  Assessment/Plan L ankle varus malunion with Charcot neuroarthropathy - to OR for tibial and fibular osteotomies.  The risks and benefits of the alternative treatment options have been discussed in detail.  The patient wishes to proceed with surgery and specifically understands risks of bleeding, infection, nerve damage, blood clots, need for additional surgery, amputation and death.   Wylene Simmer, MD Apr 02, 2016, 7:18 AM

## 2016-03-23 NOTE — Progress Notes (Signed)
Assisted Dr. Crews with left, ultrasound guided, popliteal/saphenous block. Side rails up, monitors on throughout procedure. See vital signs in flow sheet. Tolerated Procedure well. 

## 2016-03-23 NOTE — Discharge Instructions (Addendum)
Colleen Simmer, MD [place]  Please read the following information regarding your care after surgery.  Medications  You only need a prescription for the narcotic pain medicine (ex. oxycodone, Percocet, Norco).  All of the other medicines listed below are available over the counter. X acetominophen (Tylenol) 650 mg every 4-6 hours as you need for minor pain X oxycodone as prescribed for moderate to severe pain ?   Narcotic pain medicine (ex. oxycodone, Percocet, Vicodin) will cause constipation.  To prevent this problem, take the following medicines while you are taking any pain medicine. X docusate sodium (Colace) 100 mg twice a day X senna (Senokot) 2 tablets twice a day  X To help prevent blood clots, take a baby aspirin (81 mg) twice a day after surgery until you are allowed to initiate weigthbearing.  You should also get up every hour while you are awake to move around.    Weight Bearing X Do not bear any weight on the operated leg or foot.  Cast / Splint / Dressing X Keep your splint or cast clean and dry.  Dont put anything (coat hanger, pencil, etc) down inside of it.  If it gets damp, use a hair dryer on the cool setting to dry it.  If it gets soaked, call the office to schedule an appointment for a cast change.  After your dressing, cast or splint is removed; you may shower, but do not soak or scrub the wound.  Allow the water to run over it, and then gently pat it dry.  Swelling It is normal for you to have swelling where you had surgery.  To reduce swelling and pain, keep your toes above your nose for at least 3 days after surgery.  It may be necessary to keep your foot or leg elevated for several weeks.  If it hurts, it should be elevated.  Follow Up Call my office at [phone] when you are discharged from the hospital or surgery center to schedule an appointment to be seen two weeks after surgery.  Call my office at [phone] if you develop a fever  >101.5 F, nausea, vomiting, bleeding from the surgical site or severe pain.    Regional Anesthesia Blocks  1. Numbness or the inability to move the "blocked" extremity may last from 3-48 hours after placement. The length of time depends on the medication injected and your individual response to the medication. If the numbness is not going away after 48 hours, call your surgeon.  2. The extremity that is blocked will need to be protected until the numbness is gone and the  Strength has returned. Because you cannot feel it, you will need to take extra care to avoid injury. Because it may be weak, you may have difficulty moving it or using it. You may not know what position it is in without looking at it while the block is in effect.  3. For blocks in the legs and feet, returning to weight bearing and walking needs to be done carefully. You will need to wait until the numbness is entirely gone and the strength has returned. You should be able to move your leg and foot normally before you try and bear weight or walk. You will need someone to be with you when you first try to ensure you do not fall and possibly risk injury.  4. Bruising and tenderness at the needle site are common side effects and will resolve in a few days.  5. Persistent numbness or  new problems with movement should be communicated to the surgeon or the [facility] [phone] [place] [phone]).  Post Anesthesia Home Care Instructions  Activity: Get plenty of rest for the remainder of the day. A responsible adult should stay with you for 24 hours following the procedure.  For the next 24 hours, DO NOT: -Drive a car -Paediatric nurse -Drink alcoholic beverages -Take any medication unless instructed by your physician -Make any legal decisions or sign important papers.  Meals: Start with liquid foods such as gelatin or soup. Progress to regular foods as tolerated. Avoid greasy, spicy, heavy  foods. If nausea and/or vomiting occur, drink only clear liquids until the nausea and/or vomiting subsides. Call your physician if vomiting continues.  Special Instructions/Symptoms: Your throat may feel dry or sore from the anesthesia or the breathing tube placed in your throat during surgery. If this causes discomfort, gargle with warm salt water. The discomfort should disappear within 24 hours.  If you had a scopolamine patch placed behind your ear for the management of post- operative nausea and/or vomiting:  1. The medication in the patch is effective for 72 hours, after which it should be removed.  Wrap patch in a tissue and discard in the trash. Wash hands thoroughly with soap and water. 2. You may remove the patch earlier than 72 hours if you experience unpleasant side effects which may include dry mouth, dizziness or visual disturbances. 3. Avoid touching the patch. Wash your hands with soap and water after contact with the patch.   Regional Anesthesia Blocks  1. Numbness or the inability to move the "blocked" extremity may last from 3-48 hours after placement. The length of time depends on the medication injected and your individual response to the medication. If the numbness is not going away after 48 hours, call your surgeon.  2. The extremity that is blocked will need to be protected until the numbness is gone and the  Strength has returned. Because you cannot feel it, you will need to take extra care to avoid injury. Because it may be weak, you may have difficulty moving it or using it. You may not know what position it is in without looking at it while the block is in effect.  3. For blocks in the legs and feet, returning to weight bearing and walking needs to be done carefully. You will need to wait until the numbness is entirely gone and the strength has returned. You should be able to move your leg and foot normally before you try and bear weight or walk. You will need someone to  be with you when you first try to ensure you do not fall and possibly risk injury.  4. Bruising and tenderness at the needle site are common side effects and will resolve in a few days.  5. Persistent numbness or new problems with movement should be communicated to the surgeon or the [facility] [phone] [place] [phone]).   Post Anesthesia Home Care Instructions  Activity: Get plenty of rest for the remainder of the day. A responsible adult should stay with you for 24 hours following the procedure.  For the next 24 hours, DO NOT: -Drive a car -Paediatric nurse -Drink alcoholic beverages -Take any medication unless instructed by your physician -Make any legal decisions or sign important papers.  Meals: Start with liquid foods such as gelatin or soup. Progress to regular foods as tolerated. Avoid greasy, spicy, heavy foods.  If nausea and/or vomiting occur, drink only clear liquids until the nausea and/or vomiting subsides. Call your physician if vomiting continues.  Special Instructions/Symptoms: Your throat may feel dry or sore from the anesthesia or the breathing tube placed in your throat during surgery. If this causes discomfort, gargle with warm salt water. The discomfort should disappear within 24 hours.  If you had a scopolamine patch placed behind your ear for the management of post- operative nausea and/or vomiting:  1. The medication in the patch is effective for 72 hours, after which it should be removed.  Wrap patch in a tissue and discard in the trash. Wash hands thoroughly with soap and water. 2. You may remove the patch earlier than 72 hours if you experience unpleasant side effects which may include dry mouth, dizziness or visual disturbances. 3. Avoid touching the patch. Wash your hands with soap and water after contact with the patch.

## 2016-03-23 NOTE — Anesthesia Procedure Notes (Addendum)
Procedure Name: LMA Insertion Date/Time: 03/23/2016 7:39 AM Performed by: Maryella Shivers Pre-anesthesia Checklist: Patient identified, Emergency Drugs available, Suction available and Patient being monitored Patient Re-evaluated:Patient Re-evaluated prior to inductionOxygen Delivery Method: Circle System Utilized Preoxygenation: Pre-oxygenation with 100% oxygen Intubation Type: IV induction Ventilation: Mask ventilation without difficulty LMA: LMA inserted LMA Size: 4.0 Number of attempts: 1 Airway Equipment and Method: bite block Placement Confirmation: positive ETCO2 Tube secured with: Tape Dental Injury: Teeth and Oropharynx as per pre-operative assessment     Anesthesia Regional Block:  Popliteal block  Pre-Anesthetic Checklist: ,, timeout performed, Correct Patient, Correct Site, Correct Laterality, Correct Procedure, Correct Position, site marked, Risks and benefits discussed,  Surgical consent,  Pre-op evaluation,  At surgeon's request and post-op pain management  Laterality: Left and Lower  Prep: chloraprep       Needles:  Injection technique: Single-shot  Needle Type: Echogenic Needle     Needle Length: 9cm 9 cm Needle Gauge: 21 and 21 G    Additional Needles:  Procedures: ultrasound guided (picture in chart) Popliteal block Narrative:  Start time: 03/23/2016 7:14 AM End time: 03/23/2016 7:18 AM Injection made incrementally with aspirations every 5 mL.  Performed by: Personally  Anesthesiologist: Truda Staub   Anesthesia Regional Block:  Adductor canal block  Pre-Anesthetic Checklist: ,, timeout performed, Correct Patient, Correct Site, Correct Laterality, Correct Procedure, Correct Position, site marked, Risks and benefits discussed,  Surgical consent,  Pre-op evaluation,  At surgeon's request and post-op pain management  Laterality: Left and Lower  Prep: chloraprep       Needles:  Injection technique: Single-shot  Needle Type: Echogenic Needle      Needle Length: 9cm 9 cm Needle Gauge: 21 and 21 G    Additional Needles:  Procedures: ultrasound guided (picture in chart) Adductor canal block Narrative:  Start time: 03/23/2016 7:19 AM End time: 03/23/2016 7:23 AM Injection made incrementally with aspirations every 5 mL.  Performed by: Personally  Anesthesiologist: Arayla Kruschke    Left popliteal block image    Left Saphenous block image

## 2016-03-23 NOTE — Brief Op Note (Signed)
03/23/2016  10:45 AM  PATIENT:  Colleen [name]  50 y.o. female  PRE-OPERATIVE DIAGNOSIS:   1.  Left ankle tibial malunion      2.  Left ankle fibula malunion      POST-OPERATIVE DIAGNOSIS:  same  Procedure(s): 1.  Left tibial osteotomy   2.  Left fibular osteotomy   3.  Internal fixation of fibular and tibial osteotomies   4.  Large autograft bone to the tibial osteotomy   5.  AP, mortise and lateral xrays of the right ankle   SURGEON:  Wylene Simmer, MD  ASSISTANT: Mechele Claude, PA-C  ANESTHESIA:   General, regional  EBL:  50cc  TOURNIQUET:   Total Tourniquet Time Documented: Thigh (Left) - 122 minutes Total: Thigh (Left) - 123XX123 minutes  COMPLICATIONS:  None apparent  DISPOSITION:  Extubated, awake and stable to recovery.  DICTATION ID:    [id number]

## 2016-03-24 ENCOUNTER — Encounter (HOSPITAL_BASED_OUTPATIENT_CLINIC_OR_DEPARTMENT_OTHER): Payer: Self-pay | Admitting: Orthopedic Surgery

## 2016-03-30 NOTE — Op Note (Signed)
NAMEMarland Kitchen  Colleen [name], Colleen [name] NO.:  [id number]  MEDICAL RECORD NO.:  [id number]  LOCATION:                                FACILITY:  [facility]  PHYSICIAN:  Wylene Simmer, MD        DATE OF BIRTH:  04/14/1966  DATE OF PROCEDURE:  03/23/2016 DATE OF DISCHARGE:  03/23/2016                              OPERATIVE REPORT   PREOPERATIVE DIAGNOSES: 1. Left ankle tibial malunion. 2. Left ankle fibular malunion.  POSTOPERATIVE DIAGNOSIS: 1. Left ankle tibial malunion. 2. Left ankle fibular malunion.  PROCEDURE: 1. Left tibial osteotomy. 2. Left fibular osteotomy through a separate incision. 3. Internal fixation of fibular and tibial osteotomies. 4. Large autograft bone to the tibial osteotomy. 5. Three view radiographs of the left ankle.  SURGEON:  Wylene Simmer, M.D.  ASSISTANT:  Mechele Claude, PA-C.  ANESTHESIA:  General, regional.  ESTIMATED BLOOD LOSS:  50 mL.  TOURNIQUET TIME:  Two hours and two minutes at 250 mmHg.  COMPLICATIONS:  None apparent.  DISPOSITION:  Extubated, awake, and stable to recovery.  INDICATION FOR PROCEDURE:  The patient is a 50 year old female with past medical history significant for insulin-dependent diabetes. Approximately 3 months ago, she sustained an injury to the left ankle. She does not recall the specific mechanism of injury but was found to have a bimalleolar ankle fracture with severe varus angulation.  At the time of presentation, this appeared to be a Charcot joint.  The decision was made at that time to treat in closed fashion until the Charcot process resolved.  She has now had 3 months of nonweightbearing immobilization and shows abundant healing of both fractures with malunion at the fibula and tibia.  Her hemoglobin A1c is now less than 7.  She presents today for osteotomies of the tibia and fibula in an effort to preserve the joint.  She understands the risks, benefits, and the alternative treatment options, and elects  surgical treatment.  She specifically understands risks of bleeding, infection, nerve damage, blood clots, need for additional surgery, continued pain, nonunion, amputation, and death.  PROCEDURE IN DETAIL:  After preoperative consent was obtained and the correct operative site was identified, the patient was brought to the operating room and placed supine on the operating table.  General anesthesia was induced.  Preoperative antibiotics were administered. Surgical time-out was taken.  Left lower extremity was prepped and draped in standard sterile fashion with the tourniquet around the thigh. The extremity was exsanguinated and the tourniquet was inflated to 250 mmHg.  A longitudinal incision was then made over the lateral malleolus and sharp dissection was carried down through skin and subcutaneous tissue.  The fracture site was identified and abundant callus was located.  Subperiosteal dissection was carried anteriorly and posteriorly around the fibula, exposing the osteotomy site.  Attention was then turned to the medial aspect of the ankle where a longitudinal incision was made over the medial malleolus.  Sharp dissection was carried down through skin and subcutaneous tissue.  The fracture site was identified and again abundant callus was identified. The dissection was carried down in subperiosteal fashion exposing the site of the osteotomy.  At this point, K-wires were  placed marking the fibular osteotomy and a closing wedge osteotomy was cut with an oscillating saw, removing a small piece of bone.  This was passed off to the back table where it was prepared as autograft bone.  The lateral gutter was cleaned of all soft tissue and the lateral osteotomy site was mobilized appropriately.  Attention was then turned to the medial side where osteotomes were used to remove all of the healed callus.  The osteotomy was made with the osteotomes at the level of the tibial plafond.  The  area of tibial plafond fracture was mobilized with bone tamps and the subchondral bone advanced down and packed with bone graft.  The ankle was then manipulated and the talus placed back beneath the distal tibia.  AP and lateral radiographs confirmed appropriate alignment.  A Steinmann pin was then placed through the sole of the foot and advanced across the subtalar and tibiotalar joints, holding the reduction appropriately.  A 7-hole one-third tubular plate was then contoured to fit the lateral malleolus.  It was secured distally with 2 nonlocking screws.  The lag screw was then placed across the osteotomy and was tightened appropriately compressing the osteotomy site.  Approximately, 3 more nonlocking screws were inserted.  These were placed across all 4 cortices of the distal fibula and tibia.  Attention was then returned to the medial side of the ankle where the medial malleolar osteotomy site was mobilized.  The medial malleolus was positioned appropriately and provisionally pinned.  Autograft bone from the fibula and distal tibia was then morselized and packed into the site of the osteotomy.  A [employer] medial tibial plate was then positioned over the osteotomy site.  The plate was secured to the medial malleolus fragment with locking screws.  The plate was then secured proximally with nonlocking screws.  AP and lateral radiographs confirmed appropriate alignment of the medial malleolus.  The medial plate was then further secured proximally with a locking and nonlocking screws.  A final screw was placed from the tip of the medial malleolus across the osteotomy site and the metaphyseal bone of the distal tibia.  Final AP, mortise, and lateral radiographs confirmed appropriate reduction of the joint and appropriate position length of all hardware.  Both wounds were then irrigated copiously.  Vancomycin powder was sprinkled in the wound. Deep subcutaneous tissues were approximated  with Vicryl, the skin incisions were closed with nylon.  Sterile dressings were applied, followed by a well-padded short-leg splint.  Tourniquet was released at approximately 2 hours and hemostasis was achieved prior to closure.  The patient was then awakened from anesthesia and transported to the recovery room in stable condition.  FOLLOWUP PLAN:  The patient to be nonweightbearing on the left lower extremity.  She will follow up with me in the office in 2 weeks for suture removal.  She will start aspirin for DVT prophylaxis.  RADIOGRAPHS:  AP, mortise, and lateral radiographs of the left ankle were obtained intraoperatively.  These show interval reduction of fibular and tibial osteotomy sites with appropriate position and length of all hardware and appropriate alignment of the ankle joint.  No other acute injuries were noted.  Mechele Claude, PA-C, was present and scrubbed for the duration of the case.  His assistance was essential in positioning the patient, prepping and draping, gaining and maintaining exposure, performing the operation, and closing and dressing the wounds.     Wylene Simmer, MD     JH/MEDQ  D:  03/23/2016  T:  03/24/2016  Job:  [id number]

## 2016-08-16 ENCOUNTER — Ambulatory Visit (HOSPITAL_BASED_OUTPATIENT_CLINIC_OR_DEPARTMENT_OTHER)
Admission: RE | Admit: 2016-08-16 | Discharge: 2016-08-16 | Disposition: A | Discharge status: Home / Self Care | Payer: 59 | Source: Ambulatory Visit | Attending: Family Medicine | Admitting: Family Medicine

## 2016-08-16 ENCOUNTER — Other Ambulatory Visit: Payer: Self-pay | Admitting: Family Medicine

## 2016-08-16 DIAGNOSIS — J9811 Atelectasis: Secondary | ICD-10-CM | POA: Diagnosis not present

## 2016-08-16 DIAGNOSIS — J90 Pleural effusion, not elsewhere classified: Secondary | ICD-10-CM | POA: Diagnosis not present

## 2016-08-16 DIAGNOSIS — R319 Hematuria, unspecified: Secondary | ICD-10-CM

## 2016-09-27 DIAGNOSIS — R3121 Asymptomatic microscopic hematuria: Secondary | ICD-10-CM | POA: Diagnosis not present

## 2016-10-03 DIAGNOSIS — R3129 Other microscopic hematuria: Secondary | ICD-10-CM | POA: Diagnosis not present

## 2016-10-03 DIAGNOSIS — R3121 Asymptomatic microscopic hematuria: Secondary | ICD-10-CM | POA: Diagnosis not present

## 2016-10-09 DIAGNOSIS — R932 Abnormal findings on diagnostic imaging of liver and biliary tract: Secondary | ICD-10-CM | POA: Diagnosis not present

## 2016-10-09 DIAGNOSIS — R7989 Other specified abnormal findings of blood chemistry: Secondary | ICD-10-CM | POA: Diagnosis not present

## 2016-10-10 ENCOUNTER — Other Ambulatory Visit: Payer: Self-pay | Admitting: Gastroenterology

## 2016-10-10 DIAGNOSIS — R932 Abnormal findings on diagnostic imaging of liver and biliary tract: Secondary | ICD-10-CM

## 2016-10-15 ENCOUNTER — Ambulatory Visit
Admission: RE | Admit: 2016-10-15 | Discharge: 2016-10-15 | Disposition: A | Discharge status: Home / Self Care | Payer: 59 | Source: Ambulatory Visit | Attending: Gastroenterology | Admitting: Gastroenterology

## 2016-10-15 DIAGNOSIS — K7689 Other specified diseases of liver: Secondary | ICD-10-CM | POA: Diagnosis not present

## 2016-10-15 DIAGNOSIS — R932 Abnormal findings on diagnostic imaging of liver and biliary tract: Secondary | ICD-10-CM

## 2016-10-15 MED ORDER — GADOXETATE DISODIUM 0.25 MMOL/ML IV SOLN
8.0000 mL | Freq: Once | INTRAVENOUS | Status: AC | PRN
  Administered 2016-10-15: 8 mL via INTRAVENOUS

## 2016-10-18 DIAGNOSIS — E113413 Type 2 diabetes mellitus with severe nonproliferative diabetic retinopathy with macular edema, bilateral: Secondary | ICD-10-CM | POA: Diagnosis not present

## 2016-10-18 DIAGNOSIS — H43811 Vitreous degeneration, right eye: Secondary | ICD-10-CM | POA: Diagnosis not present

## 2016-10-18 DIAGNOSIS — H43822 Vitreomacular adhesion, left eye: Secondary | ICD-10-CM | POA: Diagnosis not present

## 2016-10-18 DIAGNOSIS — H3582 Retinal ischemia: Secondary | ICD-10-CM | POA: Diagnosis not present

## 2016-10-19 DIAGNOSIS — R3121 Asymptomatic microscopic hematuria: Secondary | ICD-10-CM | POA: Diagnosis not present

## 2016-10-20 DIAGNOSIS — Z23 Encounter for immunization: Secondary | ICD-10-CM | POA: Diagnosis not present

## 2016-11-09 DIAGNOSIS — E1065 Type 1 diabetes mellitus with hyperglycemia: Secondary | ICD-10-CM | POA: Diagnosis not present

## 2016-11-09 DIAGNOSIS — E109 Type 1 diabetes mellitus without complications: Secondary | ICD-10-CM | POA: Diagnosis not present

## 2016-11-28 DIAGNOSIS — R748 Abnormal levels of other serum enzymes: Secondary | ICD-10-CM | POA: Diagnosis not present

## 2016-11-28 DIAGNOSIS — R198 Other specified symptoms and signs involving the digestive system and abdomen: Secondary | ICD-10-CM | POA: Diagnosis not present

## 2016-11-28 DIAGNOSIS — R932 Abnormal findings on diagnostic imaging of liver and biliary tract: Secondary | ICD-10-CM | POA: Diagnosis not present

## 2016-11-29 ENCOUNTER — Other Ambulatory Visit (HOSPITAL_COMMUNITY): Payer: Self-pay | Admitting: Gastroenterology

## 2016-11-29 DIAGNOSIS — R932 Abnormal findings on diagnostic imaging of liver and biliary tract: Secondary | ICD-10-CM

## 2016-12-08 ENCOUNTER — Other Ambulatory Visit (HOSPITAL_COMMUNITY): Payer: Self-pay | Admitting: Gastroenterology

## 2016-12-12 ENCOUNTER — Ambulatory Visit (HOSPITAL_COMMUNITY)
Admission: RE | Admit: 2016-12-12 | Discharge: 2016-12-12 | Disposition: A | Discharge status: Home / Self Care | Payer: 59 | Source: Ambulatory Visit | Attending: Gastroenterology | Admitting: Gastroenterology

## 2016-12-12 ENCOUNTER — Encounter (HOSPITAL_COMMUNITY): Payer: Self-pay

## 2016-12-12 DIAGNOSIS — R932 Abnormal findings on diagnostic imaging of liver and biliary tract: Secondary | ICD-10-CM

## 2016-12-13 DIAGNOSIS — R600 Localized edema: Secondary | ICD-10-CM | POA: Diagnosis not present

## 2016-12-13 DIAGNOSIS — Z4789 Encounter for other orthopedic aftercare: Secondary | ICD-10-CM | POA: Diagnosis not present

## 2016-12-15 ENCOUNTER — Other Ambulatory Visit: Payer: Self-pay | Admitting: Orthopedic Surgery

## 2016-12-15 DIAGNOSIS — Z4789 Encounter for other orthopedic aftercare: Secondary | ICD-10-CM

## 2016-12-18 ENCOUNTER — Ambulatory Visit
Admission: RE | Admit: 2016-12-18 | Discharge: 2016-12-18 | Disposition: A | Discharge status: Home / Self Care | Payer: 59 | Source: Ambulatory Visit | Attending: Orthopedic Surgery | Admitting: Orthopedic Surgery

## 2016-12-18 DIAGNOSIS — Z4789 Encounter for other orthopedic aftercare: Secondary | ICD-10-CM

## 2016-12-18 DIAGNOSIS — S82302P Unspecified fracture of lower end of left tibia, subsequent encounter for closed fracture with malunion: Secondary | ICD-10-CM | POA: Diagnosis not present

## 2016-12-26 ENCOUNTER — Ambulatory Visit (HOSPITAL_BASED_OUTPATIENT_CLINIC_OR_DEPARTMENT_OTHER)
Admission: RE | Admit: 2016-12-26 | Discharge: 2016-12-26 | Disposition: A | Discharge status: Home / Self Care | Payer: 59 | Source: Ambulatory Visit | Attending: Family Medicine | Admitting: Family Medicine

## 2016-12-26 ENCOUNTER — Other Ambulatory Visit: Payer: Self-pay | Admitting: Family Medicine

## 2016-12-26 DIAGNOSIS — M7989 Other specified soft tissue disorders: Secondary | ICD-10-CM | POA: Diagnosis present

## 2016-12-26 DIAGNOSIS — M79605 Pain in left leg: Secondary | ICD-10-CM | POA: Diagnosis not present

## 2016-12-26 DIAGNOSIS — R600 Localized edema: Secondary | ICD-10-CM | POA: Diagnosis not present

## 2016-12-26 DIAGNOSIS — E11610 Type 2 diabetes mellitus with diabetic neuropathic arthropathy: Secondary | ICD-10-CM | POA: Diagnosis not present

## 2016-12-27 ENCOUNTER — Other Ambulatory Visit (HOSPITAL_COMMUNITY): Payer: Self-pay | Admitting: Gastroenterology

## 2016-12-27 DIAGNOSIS — R932 Abnormal findings on diagnostic imaging of liver and biliary tract: Secondary | ICD-10-CM

## 2017-01-02 ENCOUNTER — Other Ambulatory Visit: Payer: Self-pay | Admitting: *Deleted

## 2017-01-02 DIAGNOSIS — M7989 Other specified soft tissue disorders: Secondary | ICD-10-CM

## 2017-01-03 ENCOUNTER — Ambulatory Visit (HOSPITAL_COMMUNITY)
Admission: RE | Admit: 2017-01-03 | Discharge: 2017-01-03 | Disposition: A | Discharge status: Home / Self Care | Payer: 59 | Source: Ambulatory Visit | Attending: Gastroenterology | Admitting: Gastroenterology

## 2017-01-03 DIAGNOSIS — K769 Liver disease, unspecified: Secondary | ICD-10-CM | POA: Diagnosis not present

## 2017-01-03 DIAGNOSIS — K7689 Other specified diseases of liver: Secondary | ICD-10-CM | POA: Diagnosis not present

## 2017-01-03 DIAGNOSIS — R932 Abnormal findings on diagnostic imaging of liver and biliary tract: Secondary | ICD-10-CM

## 2017-01-08 ENCOUNTER — Ambulatory Visit (HOSPITAL_COMMUNITY)
Admission: RE | Admit: 2017-01-08 | Discharge: 2017-01-08 | Disposition: A | Discharge status: Home / Self Care | Payer: 59 | Source: Ambulatory Visit | Attending: Surgery | Admitting: Surgery

## 2017-01-08 DIAGNOSIS — M7989 Other specified soft tissue disorders: Secondary | ICD-10-CM | POA: Diagnosis not present

## 2017-01-08 DIAGNOSIS — I739 Peripheral vascular disease, unspecified: Secondary | ICD-10-CM | POA: Diagnosis not present

## 2017-01-10 DIAGNOSIS — E103411 Type 1 diabetes mellitus with severe nonproliferative diabetic retinopathy with macular edema, right eye: Secondary | ICD-10-CM | POA: Diagnosis not present

## 2017-01-10 DIAGNOSIS — H43813 Vitreous degeneration, bilateral: Secondary | ICD-10-CM | POA: Diagnosis not present

## 2017-01-12 ENCOUNTER — Encounter: Payer: Self-pay | Admitting: Vascular Surgery

## 2017-01-19 ENCOUNTER — Encounter: Payer: Self-pay | Admitting: Vascular Surgery

## 2017-01-19 ENCOUNTER — Ambulatory Visit (INDEPENDENT_AMBULATORY_CARE_PROVIDER_SITE_OTHER): Payer: 59 | Admitting: Vascular Surgery

## 2017-01-19 VITALS — BP 205/91 | HR 67 | Temp 97.3°F | Resp 18 | Ht 62.0 in | Wt 201.4 lb

## 2017-01-19 DIAGNOSIS — I871 Compression of vein: Secondary | ICD-10-CM | POA: Diagnosis not present

## 2017-01-19 DIAGNOSIS — I872 Venous insufficiency (chronic) (peripheral): Secondary | ICD-10-CM | POA: Diagnosis not present

## 2017-01-19 NOTE — Progress Notes (Signed)
Referred by:  Corine Shelter, PA-C [address] [place], [place] [address]  Reason for referral: Left > right leg swelling    History of Present Illness   Colleen [name] is a 51 y.o. (January 10, 1966) female who presents with chief complaint: persistent left > right leg swelling.  Patient notes, onset of swelling 9 months ago, associated with left ankle fracture that underwent ORIF.  The patient's symptoms include: bilateral leg swelling worsening with standing, bursting sensation, and aching L>R leg.  The patient has had no history of DVT, known history of pregnancy, known history of varicose vein, no history of venous stasis ulcers, no history of  Lymphedema and no history of skin changes in lower legs.  There is no family history of venous disorders.  The patient has used compression stockings in the past without any sx relief.  Past Medical History:  Diagnosis Date  . Asthma   . Complication of anesthesia    Psuedocholinerasterase deficiency per pt  . Diabetes mellitus    TYPE II  . Diabetic macular edema (HCC)    Pt gets injections in eyes with [facility]  . Seasonal allergies     Past Surgical History:  Procedure Laterality Date  . BACK SURGERY  2009  . CATARACT SURGERY     BILATERAL  . CESAREAN SECTION     X2  . ORIF ANKLE FRACTURE Left 03/23/2016   Procedure: OPEN REDUCTION INTERNAL FIXATION (ORIF) ANKLE FRACTURE MALLEOLUS  MALUNION WITH TIBIAL AND FIBULAR OSTEOTOMY AND AUTOGRAFT;  Surgeon: Wylene Simmer, MD;  Location: [facility];  Service: Orthopedics;  Laterality: Left;    Social History   Social History  . Marital status: Married    Spouse name: N/A  . Number of children: N/A  . Years of education: N/A   Occupational History  . Not on file.   Social History Main Topics  . Smoking status: Former Smoker    Quit date: 01/17/1987  . Smokeless tobacco: Never Used  . Alcohol use Yes     Comment: occ  . Drug use: No  . Sexual activity: Yes   Comment: PATIENT'S PARTNER WITH VASECTOMY   Other Topics Concern  . Not on file   Social History Narrative  . No narrative on file    Family History  Problem Relation Age of Onset  . Hypertension Father   . Hypertension Paternal Aunt   . Hypertension Paternal Grandmother   . Hypertension Paternal Grandfather     Current Outpatient Prescriptions  Medication Sig Dispense Refill  . ALBUTEROL SULFATE IN Inhale into the lungs.    Marland Kitchen aspirin EC 81 MG tablet Take 1 tablet (81 mg total) by mouth 2 (two) times daily. 84 tablet 0  . Insulin Aspart (NOVOLOG FLEXPEN [place]) Inject into the skin. Sliding scale    . insulin NPH Human (HUMULIN N,NOVOLIN N) 100 UNIT/ML injection Inject 15 Units into the skin 2 (two) times daily.    Marland Kitchen lisinopril (PRINIVIL,ZESTRIL) 2.5 MG tablet Take 2.5 mg by mouth daily.    Marland Kitchen loratadine (CLARITIN) 10 MG tablet Take 10 mg by mouth daily.    . Cholecalciferol (VITAMIN D) 2000 units CAPS Take by mouth.    . docusate sodium (COLACE) 100 MG capsule Take 1 capsule (100 mg total) by mouth 2 (two) times daily. While taking narcotic pain medicine. (Patient not taking: Reported on 01/19/2017) 30 capsule 0  . oxyCODONE (ROXICODONE) 5 MG immediate release tablet Take 1-2 tablets (  5-10 mg total) by mouth every 4 (four) hours as needed for moderate pain or severe pain. (Patient not taking: Reported on 01/19/2017) 30 tablet 0  . senna (SENOKOT) 8.6 MG TABS tablet Take 2 tablets (17.2 mg total) by mouth 2 (two) times daily. (Patient not taking: Reported on 01/19/2017) 30 each 0   No current facility-administered medications for this visit.     No Known Allergies   REVIEW OF SYSTEMS:   Cardiac:  positive for: no symptoms, negative for: Chest pain or chest pressure, Shortness of breath upon exertion and Shortness of breath when lying flat,   Vascular:  positive for: Pain in calf, thigh, or hip brought on by ambulation, leg swelling negative for: Pain in feet at night that wakes  you up from your sleep and Blood clot in your veins  Pulmonary:  positive for: no symptoms,  negative for: Oxygen at home, Productive cough and Wheezing  Neurologic:  positive for: No symptoms, negative for: Sudden weakness in arms or legs, Sudden numbness in arms or legs, Sudden onset of difficulty speaking or slurred speech, Temporary loss of vision in one eye and Problems with dizziness  Gastrointestinal:  positive for: no symptoms, negative for: Blood in stool and Vomited blood  Genitourinary:  positive for: Blood in urine, negative for: Burning when urinating   Psychiatric:  positive for: no symptoms,  negative for: Major depression  Hematologic:  positive for: no symptoms,  negative for: negative for: Bleeding problems and Problems with blood clotting too easily  Dermatologic:  positive for: no symptoms, negative for: Rashes or ulcers  Constitutional:  positive for: no symptoms, negative for: Fever or chills  Ear/Nose/Throat:  positive for: no symptoms, negative for: Change in hearing, Nose bleeds and Sore throat  Musculoskeletal:  positive for: no symptoms, negative for: Back pain, Joint pain and Muscle pain   Physical Examination   Vitals:   01/19/17 0900 01/19/17 0912  BP: (!) 205/85 (!) 205/91  Pulse: 67   Resp: 18   Temp: 97.3 F (36.3 C)   TempSrc: Oral   SpO2: 100%   Weight: 201 lb 6.4 oz (91.4 kg)   Height: 5\' 2"  (1.575 m)     Body mass index is 36.84 kg/m.  General Alert, O x 3, mildly obese, NAD  [place]/AT,    Ear/Nose/Throat Hearing grossly intact, nares without erythema or drainage, oropharynx without Erythema or Exudate, Mallampati score: 3, Dentition intact  Eyes PERRLA, EOMI,  bloody sclera in R eye  Neck Supple, mid-line trachea,    Pulmonary Sym exp, good B air movt, CTA B  Cardiac RRR, Nl S1, S2, no Murmurs, No rubs, No S3,S4  Vascular Vessel Right Left  Radial Palpable Palpable  Brachial Palpable Palpable  Carotid  Palpable Palpable  Aorta Not palpable due to pannus N/A  Femoral Palpable Palpable  Popliteal Not palpable Not palpable  PT Palpable Palpable  DP Palpable Palpable    Gastrointestinal soft, non-distended, non-tender to palpation, No guarding or rebound, no HSM, no masses, no CVAT B, No palpable prominent aortic pulse,    Musculoskeletal M/S 5/5 throughout  , Extremities without ischemic changes  , Edema present: R 2+, L 3+, No obvious varicosities due to swelling, No Lipodermatosclerosis present, surgical scar well healed in L ankle  Neurologic Cranial nerves 2-12 intact  , Pain and light touch intact in extremities  , Motor exam as listed above  Psychiatric Judgement intact, Mood & affect appropriate for pt's clinical situation  Dermatologic  See M/S exam for extremity exam, No rashes otherwise noted  Lymphatic  Palpable lymph nodes: None    Non-invasive Vascular Imaging   LLE Venous Insufficiency Duplex (01/08/17):   LLE:  no DVT and SVT,   no GSV reflux: 3.8-6.5 mm   no SSV reflux,  + deep venous reflux: CFV   Outside Studies/Documentation   6 pages of outside documents were reviewed including: outside ortho and PCP charts.   Medical Decision Making   Colleen [name] is a 51 y.o. female who presents with: BLE chronic venous insufficiency (C2), varicose veins with complications, likely proximal venous stenosis in LLE (DDx include May-Thurner syndrome, iliac vs IVC atresia or stenosis)   Pt's clinical findings and GSV size would strongly suggest venous insufficiency so the minimal findings on the duplex are surprising.  This suggests a more proximal venous stenosis such as May-Thurner's syndrome given her lack of system co-morbidities that would result in her findings.  Based on the patient's history and examination, I recommend: CT venogram pelvis (evaluate iliac venous system for compression or stenosis and any IVC agenesis or atresia).  The patient will follow up in 4  weeks once the study is done.  I discussed with the patient the use of her 20-30 mm thigh high compression stockings.  Thank you for allowing [place] to participate in this patient's care.   Adele Barthel, MD, FACS [facility] Office: [phone] Pager: [phone]  01/19/2017, 11:17 AM

## 2017-01-23 ENCOUNTER — Ambulatory Visit
Admission: RE | Admit: 2017-01-23 | Discharge: 2017-01-23 | Disposition: A | Discharge status: Home / Self Care | Payer: 59 | Source: Ambulatory Visit | Attending: Vascular Surgery | Admitting: Vascular Surgery

## 2017-01-23 ENCOUNTER — Telehealth: Payer: Self-pay

## 2017-01-23 DIAGNOSIS — I872 Venous insufficiency (chronic) (peripheral): Secondary | ICD-10-CM

## 2017-01-23 DIAGNOSIS — M7989 Other specified soft tissue disorders: Secondary | ICD-10-CM | POA: Diagnosis not present

## 2017-01-23 DIAGNOSIS — I871 Compression of vein: Secondary | ICD-10-CM

## 2017-01-23 MED ORDER — IOPAMIDOL (ISOVUE-370) INJECTION 76%
100.0000 mL | Freq: Once | INTRAVENOUS | Status: AC | PRN
  Administered 2017-01-23: 100 mL via INTRAVENOUS

## 2017-01-23 NOTE — Telephone Encounter (Signed)
Phone call from pt.  Stated she wanted Dr. Bridgett Larsson to be aware of increased swelling of her bilat. LE's up to level of umbilicus, and noted a tightness in the genital region.  Stated the swelling has increased since seen in office on 4/27.  Reported there is some improvement overnight.  Stated "I'm so uncomfortable."  Described pitting edema of her knee caps.  Questioned about SOB.  Pt. reported she does have "some SOB", and related it to this being allergy season.  Reported has noticed a wheezing that is worse at night.  Denied any recent onset/ worsening of cough.  Also wanted Dr. Bridgett Larsson to be aware she was evaluated for hematuria last Oct., and the Urologist couldn't find a cause.  Pt. related that in her reading about May Thurners Syndrome, it was noted that unexplained hematuria can be a symptom.  Reported she has been wearing knee high compression stockings, and feels like the swelling may be worse with the compression.  Also, reported an intermittent throbbing of her back/ spine.  Stated that the left leg swelling is worse during the day compared to the right, but that eventually the right leg swelling eventually catches up with the left leg.   Has f/u appt. in June.  Questioned if she really can wait that long, due to the worsening of her swelling now.  Reported she had her CTA pelvis today.  Will give Dr. Bridgett Larsson update, and call back with recommendations.

## 2017-01-24 DIAGNOSIS — S82842K Displaced bimalleolar fracture of left lower leg, subsequent encounter for closed fracture with nonunion: Secondary | ICD-10-CM | POA: Diagnosis not present

## 2017-01-24 DIAGNOSIS — R600 Localized edema: Secondary | ICD-10-CM | POA: Diagnosis not present

## 2017-01-25 NOTE — Telephone Encounter (Signed)
Dr. Bridgett Larsson reviewed CTA Venogram and recommended that pt. Plan to keep her f/u appt.  Per pt. request the f/u appt. was moved up to 02/09/17, as she will be out of town 5/6-5/12.  Pt. Agreed.

## 2017-01-31 ENCOUNTER — Ambulatory Visit: Payer: 59 | Admitting: Vascular Surgery

## 2017-02-02 DIAGNOSIS — E109 Type 1 diabetes mellitus without complications: Secondary | ICD-10-CM | POA: Diagnosis not present

## 2017-02-02 DIAGNOSIS — E1065 Type 1 diabetes mellitus with hyperglycemia: Secondary | ICD-10-CM | POA: Diagnosis not present

## 2017-02-05 ENCOUNTER — Encounter: Payer: Self-pay | Admitting: Vascular Surgery

## 2017-02-05 NOTE — Progress Notes (Signed)
Established Venous Insufficiency   History of Present Illness  Colleen [name] is a 51 y.o. (04-Apr-1966) female who presents with chief complaint: B leg swelling.  The patient's symptoms have progressed.  Pt feels she is swelling up to waist now.  The patient's symptoms are: worsening B leg swelling with burst sensation and swelling in pelvis up to waist.  The patient is compliant with compression stockings.  She works as a Software engineer.  Reportedly pt has had some weeping at her left ankle site.  The patient's PMH, PSH, SH, and FamHx are unchanged from 01/19/17.  Current Outpatient Prescriptions  Medication Sig Dispense Refill  . ALBUTEROL SULFATE IN Inhale into the lungs.    Marland Kitchen aspirin EC 81 MG tablet Take 1 tablet (81 mg total) by mouth 2 (two) times daily. 84 tablet 0  . Cholecalciferol (VITAMIN D) 2000 units CAPS Take by mouth.    . docusate sodium (COLACE) 100 MG capsule Take 1 capsule (100 mg total) by mouth 2 (two) times daily. While taking narcotic pain medicine. (Patient not taking: Reported on 01/19/2017) 30 capsule 0  . Insulin Aspart (NOVOLOG FLEXPEN [place]) Inject into the skin. Sliding scale    . insulin NPH Human (HUMULIN N,NOVOLIN N) 100 UNIT/ML injection Inject 15 Units into the skin 2 (two) times daily.    Marland Kitchen lisinopril (PRINIVIL,ZESTRIL) 2.5 MG tablet Take 2.5 mg by mouth daily.    Marland Kitchen loratadine (CLARITIN) 10 MG tablet Take 10 mg by mouth daily.    Marland Kitchen oxyCODONE (ROXICODONE) 5 MG immediate release tablet Take 1-2 tablets (5-10 mg total) by mouth every 4 (four) hours as needed for moderate pain or severe pain. (Patient not taking: Reported on 01/19/2017) 30 tablet 0  . senna (SENOKOT) 8.6 MG TABS tablet Take 2 tablets (17.2 mg total) by mouth 2 (two) times daily. (Patient not taking: Reported on 01/19/2017) 30 each 0   No current facility-administered medications for this visit.     No Known Allergies  On ROS today: no rest pain , no intermittent claudication    Physical  Examination   Vitals:   02/09/17 0834  BP: (!) 216/94  Pulse: 72  Resp: 20  Temp: 98.2 F (36.8 C)  TempSrc: Oral  SpO2: 100%  Weight: 197 lb 12.8 oz (89.7 kg)  Height: 5\' 2"  (1.575 m)    Body mass index is 36.18 kg/m.  General Alert, O x 3, WD, NAD  Pulmonary Sym exp, good B air movt, CTA B  Cardiac RRR, Nl S1, S2, no Murmurs, No rubs, No S3,S4  Vascular Vessel Right Left  Radial Palpable Palpable  Brachial Palpable Palpable  Carotid Palpable, No Bruit Palpable, No Bruit  Aorta Not palpable N/A  Femoral Palpable Palpable  Popliteal Not palpable Not palpable  PT Not palpable due to edema Not palpable due to edema  DP Not palpable due to edema Not palpable due to edema    Gastrointestinal soft, non-distended, non-tender to palpation, No guarding or rebound, no HSM, no masses, no CVAT B, No palpable prominent aortic pulse,    Musculoskeletal M/S 5/5 throughout  , Extremities without ischemic changes  , Edema present: B 2+, No obvious varicosities , No Lipodermatosclerosis present, healed incision in L ankle  Neurologic Cranial nerves 2-12 intact  , Pain and light touch intact in extremities  , Motor exam as listed above     CT Venogram (01/23/17)   1. Normal iliac venous system and IVC. No evidence of DVT or  May-Thurner syndrome.  Based on my review of this patient's CTV, I don't see evidence of proximal venous agenesis or atresia.  I also don't seen any anatomy consistent with May-Thurner's syndrome.  There are extensive large varicosities L>R leg and labial varicosities present   Medical Decision Making   Colleen [name] is a 51 y.o. female who presents with: B phlebolymphedema, B leg chronic venous insufficiency (C2 Ep Ad,s Pr)   This pt's CT venogram demonstrated normal pelvic and abdominal venous anatomy.  It also demonstrates extensive large varicosities and bilateral subcutaneous tissue edema.  There are also labial varicosities that might be consistent with  pelvic congestion.  While it is possible that ascending venography and IVUS might find some type of dynamic venous compression, I doubt it is off any clinical importance as this would be transient.  I suspect the prior bilateral venous reflux studies were incorrect given the CT venogram findings.  In general when pt develop this degree of swelling, there usually is a mixed diagnosis of phlebolymphedema, I.e chronic venous insufficiency with some degree of lymphedema.  Subsequently, I am referring the patient for lymphatic massage and sequential compression therapy.  The patient will continue with her current compressive therapy.  I discussed with the patient the use of her 20-30 mm thigh high compression stockings and need for 3 month trial of such.  After a 3 months, she will follow up with my partners for evaluation in the [facility] with a repeat BLE venous reflux.  I suspect the repeat study may be more diagnostic.  Additionally, I recommend she follow up with her PCP for evaluation for a systemic etiology of her swelling.  Thank you for allowing [place] to participate in this patient's care.   Adele Barthel, MD, FACS [facility] Office: [phone] Pager: [phone]

## 2017-02-09 ENCOUNTER — Ambulatory Visit (INDEPENDENT_AMBULATORY_CARE_PROVIDER_SITE_OTHER): Payer: 59 | Admitting: Vascular Surgery

## 2017-02-09 ENCOUNTER — Encounter: Payer: Self-pay | Admitting: Vascular Surgery

## 2017-02-09 VITALS — BP 216/94 | HR 72 | Temp 98.2°F | Resp 20 | Ht 62.0 in | Wt 197.8 lb

## 2017-02-09 DIAGNOSIS — I872 Venous insufficiency (chronic) (peripheral): Secondary | ICD-10-CM

## 2017-02-12 ENCOUNTER — Telehealth: Payer: Self-pay | Admitting: Vascular Surgery

## 2017-02-12 NOTE — Telephone Encounter (Signed)
Called patient to notify her that I faxed the referral to the Lymphedema Clinic at [facility] per Dr.Chen's instructions on 02/09/17. Per Izora Gala at their location they will contact the patient directly to schedule. Also I rescheduled her appointment from 05/15/17 for a 3 month follow up due to the office notes in Epic where Irwin indicated she needs a bil venous reflux study prior to seeing Dr.Lawson.  awt

## 2017-02-12 NOTE — Addendum Note (Signed)
Addended by: Lianne Cure A on: 02/12/2017 03:51 PM   Modules accepted: Orders

## 2017-02-14 DIAGNOSIS — Z1211 Encounter for screening for malignant neoplasm of colon: Secondary | ICD-10-CM | POA: Diagnosis not present

## 2017-02-14 DIAGNOSIS — R748 Abnormal levels of other serum enzymes: Secondary | ICD-10-CM | POA: Diagnosis not present

## 2017-02-14 DIAGNOSIS — R197 Diarrhea, unspecified: Secondary | ICD-10-CM | POA: Diagnosis not present

## 2017-02-26 DIAGNOSIS — R600 Localized edema: Secondary | ICD-10-CM | POA: Diagnosis not present

## 2017-02-26 DIAGNOSIS — I10 Essential (primary) hypertension: Secondary | ICD-10-CM | POA: Diagnosis not present

## 2017-02-26 DIAGNOSIS — E11610 Type 2 diabetes mellitus with diabetic neuropathic arthropathy: Secondary | ICD-10-CM | POA: Diagnosis not present

## 2017-02-26 DIAGNOSIS — E10610 Type 1 diabetes mellitus with diabetic neuropathic arthropathy: Secondary | ICD-10-CM | POA: Diagnosis not present

## 2017-02-26 DIAGNOSIS — R0602 Shortness of breath: Secondary | ICD-10-CM | POA: Diagnosis not present

## 2017-03-02 ENCOUNTER — Ambulatory Visit: Payer: 59 | Admitting: Vascular Surgery

## 2017-03-06 ENCOUNTER — Ambulatory Visit (HOSPITAL_COMMUNITY): Payer: 59 | Attending: Vascular Surgery | Admitting: Physical Therapy

## 2017-03-06 DIAGNOSIS — R262 Difficulty in walking, not elsewhere classified: Secondary | ICD-10-CM | POA: Diagnosis present

## 2017-03-06 DIAGNOSIS — I890 Lymphedema, not elsewhere classified: Secondary | ICD-10-CM | POA: Diagnosis not present

## 2017-03-06 NOTE — Therapy (Signed)
[place], [place], [address] Phone: [phone]   Fax:  [phone]  Physical Therapy Evaluation  Patient Details  Name: Colleen [name] MRN: [id number] Date of Birth: 1966/08/21 Referring Provider: Adele Barthel  Encounter Date: 03/06/2017      PT End of Session - 03/06/17 0957    Visit Number 1   Number of Visits 18   Date for PT Re-Evaluation 04/05/17   Authorization Type [employer]   Authorization - Visit Number 1   Authorization - Number of Visits 18   PT Start Time 0815   PT Stop Time 0945   PT Time Calculation (min) 90 min   Activity Tolerance Patient tolerated treatment well   Behavior During Therapy [facility] for tasks assessed/performed      Past Medical History:  Diagnosis Date  . Asthma   . Complication of anesthesia    Psuedocholinerasterase deficiency per pt  . Diabetes mellitus    TYPE II  . Diabetic macular edema (HCC)    Pt gets injections in eyes with Eyelea  . Seasonal allergies     Past Surgical History:  Procedure Laterality Date  . BACK SURGERY  2009  . CATARACT SURGERY     BILATERAL  . CESAREAN SECTION     X2  . ORIF ANKLE FRACTURE Left 03/23/2016   Procedure: OPEN REDUCTION INTERNAL FIXATION (ORIF) ANKLE FRACTURE MALLEOLUS  MALUNION WITH TIBIAL AND FIBULAR OSTEOTOMY AND AUTOGRAFT;  Surgeon: Wylene Simmer, MD;  Location: [place];  Service: Orthopedics;  Laterality: Left;    There were no vitals filed for this visit.       Subjective Assessment - 03/06/17 0815    Subjective (P)  Colleen [name] states that a year a ago she had significant reconstruction of her left ankle.  She went back to work in January and about the third week of January she noticed that she started having  B LE swelling with the left greater than the right.  She feels the swelling goes all the way up to her waist by the end of the day.  She went to her vascular MD who ruled out DVT and has  referred her to skilled physical therapy for lymphedema treatment.   She has an appointment with a cardiologist next week.     Pertinent History (P)  ankle reconstruciton; liver issues, neuropathic, DM   How long can you sit comfortably? (P)  no problem    How long can you stand comfortably? (P)  hours    How long can you walk comfortably? (P)  less than five minutes   Patient Stated Goals (P)  less sweilling    Currently in Pain? (P)  No/denies            [facility] PT Assessment - 03/06/17 0001      Assessment   Medical Diagnosis Lymphedma   Referring Provider Adele Barthel   Onset Date/Surgical Date 10/14/16   Next MD Visit --   Dr.Chen is referring pt to Dr. Kellie Simmering his partner   Prior Therapy none      Precautions   Precautions Other (comment)  ? CHF pt going to cardiologist next week.      Restrictions   Weight Bearing Restrictions No     Balance Screen   Has the patient fallen in the past 6 months No   Has the patient had a decrease in activity level because of a fear of falling?  Yes   Is the patient reluctant to leave their home because of a fear of falling?  No     Home Ecologist residence     Prior Function   Level of Independence Independent   Vocation Full time employment   Vocation Requirements pharmasist   Leisure yard work      Cognition   Overall Cognitive Status Within [place] for tasks assessed     Observation/Other Assessments   Other Surveys  --  Life impact 58           [place] - 03/06/17 0913      Surgeries   Other Surgery Date --  03/23/2017     What other symptoms do you have   Are you Having Heaviness or Tightness No   Are you having Pain No   Are you having pitting edema Yes   Body Site legs    Is it Hard or Difficult finding clothes that fit Yes   Do you have infections No   Is there Decreased scar mobility Yes     Lymphedema Stage   Stage STAGE 2 SPONTANEOUSLY  IRREVERSIBLE     Lymphedema Assessments   Lymphedema Assessments Lower extremities     Right Lower Extremity Lymphedema   At Groin Measure at Horizontal from Pubic Bone 67.8 cm   20 cm Proximal to Suprapatella 64 cm   10 cm Proximal to Suprapatella 55.8 cm   At Midpatella/Popliteal Crease 42.7 cm   30 cm Proximal to Floor at Lateral Plantar Foot 46.8 cm   20 cm Proximal to Floor at Lateral Plantar Foot 39 1   10 cm Proximal to Floor at Lateral Malleoli 27.8 cm   Circumference of ankle/heel 33.7 cm.   5 cm Proximal to 1st MTP Joint 24 cm   Across MTP Joint 25 cm   Around Proximal Great Toe 9.3 cm     Left Lower Extremity Lymphedema   At Groin Measure at Horizontal from Pubic Bone 68.2 cm   20 cm Proximal to Suprapatella 64.8 cm   10 cm Proximal to Suprapatella 56.2 cm   At Midpatella/Popliteal Crease 43 cm   30 cm Proximal to Floor at Lateral Plantar Foot 48.5 cm   20 cm Proximal to Floor at Lateral Plantar Foot 38.3 cm   10 cm Proximal to Floor at Lateral Malleoli 33 cm   Circumference of ankle/heel 37.2 cm.   5 cm Proximal to 1st MTP Joint 26.8 cm   Across MTP Joint 25.5 cm   Around Proximal Great Toe 9.8 cm         Objective measurements completed on examination: See above findings.                  PT Education - 03/06/17 0956    Education provided Yes   Education Details Educated pt on Lymphedema and it's prognosis; Educated on treatment course, educated on Exercises to complete to improve lymphatic circulation.     Person(s) Educated Patient   Methods Explanation;Handout   Comprehension Verbalized understanding          PT Short Term Goals - 03/06/17 1021      PT SHORT TERM GOAL #1   Title Pt to be able to verbalize the signs and sx of cellulitis and why when you have lymphedema you are at a greater risk.    Time 2   Period Weeks   Status New  PT SHORT TERM GOAL #2   Title No lymphatic reflux to be present to decrease risk of infection     Time 2   Period Weeks   Status New     PT SHORT TERM GOAL #3   Title Pt volume to be decreased by 4 cm on Lt LE; 2-3 on Rt to be able to don and doff most clothing with ease.    Time 3   Period Weeks   Status New     PT SHORT TERM GOAL #4   Title Pt to be completing exercises to improve lymphatic circulation on a daily basis.    Time 1   Period Weeks   Status New           PT Long Term Goals - 03/06/17 1027      PT LONG TERM GOAL #1   Title Pt volume to be decreased by 6-5 cm on pt Lt LE and by 3-4 on right LE to demonstrate optimal volume reduction to be able to be fitted for a garment and reduce risk of infection.   Time 4   Period Weeks   Status New     PT LONG TERM GOAL #2   Title Pt to have obtained and to be able to don and doff compression garment   Time 6   Period Weeks   Status New     PT LONG TERM GOAL #3   Title Pt to have obtained and be using her compression pump, (will be ordered after cardiologist approves lymphedema services).    Time 6   Period Weeks   Status New     PT LONG TERM GOAL #4   Title PT to be able to walk for 20-30 minutes without fatigue or SOB   Time 6   Period Weeks   Status New                Plan - 03/06/17 0959    Clinical Impression Statement Colleen [name] is a pleasant 51 yo female who was initially seen for a Lt ankle fracture in March of 2017 of unknown orgin; Dr. Doran Durand dx charot neruoarthropathy at this time.  By June the fx still had not healed and she opted to have a tibial/fibular osteotomy to realign her ankle.  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   Around the third week of January she noted that she was having increase swelling in her ankles.  The swelling continued to progress to the point where she now has increased swelling in her thighs and  feels that by the end of the day she has swelling in her abdominal area as well.   She went to her vascular MD who  completed venous studies and ruled out DVT as well as any venous reflux and has referred her to skilled physical therapy for lymphedema treatment.  Of note she scratched her left leg doing yardwork this weekend and there is noted Lymphatic reflux.  She had labs drawn and there is some question as to whether Colleen [name] has CHF as well therefore she has an appointment with a cardiologist next week.  Colleen [name] was measured for volume B.  She was educated on lymphedema including what lymphedema was, the risks including cellulitis and the importance of cleansing and keeping an eye on her scratch and the treatment inclucing the fact that there is no cure.  We agreed that treatment will begin  once pt has been cleared by the cardiologist.  Colleen [name] lives in [place] and works in [place] therefore we also agreed that we will attempt to find a treatment center that will be more convenient for the patient.     History and Personal Factors relevant to plan of care: DM with peripheral neuropathy, S/P ORIF of Lt ankle with scarring, Charot neuroarthropathy, abnormal liver enzymes.    Clinical Presentation Evolving   Clinical Decision Making Moderate   Rehab Potential Good   PT Frequency 3x / week   PT Duration 6 weeks   PT Treatment/Interventions ADLs/Self Care Home Management;Manual lymph drainage;Patient/family education;Manual techniques;Compression bandaging   PT Next Visit Plan Cut foam for B LE begin manual lymph drainage to B LE followed by compression bandaging.  May begin with bandaging B LE only to allow pt to get use to bandages and then add thigh next treatment if pt feels she would do better with this approach.    PT Home Exercise Plan given to increase lymph circulation    Consulted and Agree with Plan of Care Patient      Patient will benefit from skilled therapeutic intervention in order to improve the following deficits and impairments:  Cardiopulmonary status limiting activity,  Decreased activity tolerance, Decreased skin integrity, Difficulty walking, Increased edema  Visit Diagnosis: Difficulty in walking, not elsewhere classified  Lymphedema, not elsewhere classified     Problem List Patient Active Problem List   Diagnosis Date Noted  . Chronic venous insufficiency 01/19/2017  . Type II diabetes mellitus ([place]) 01/17/2012  . Serum cholinesterase deficiency ([place]) 01/17/2012  . Premature menopause 01/17/2012  . GERD (gastroesophageal reflux disease)   . [place], [place] CLT [phone] 03/06/2017, 10:28 AM  [place], [place], [address] Phone: [phone]   Fax:  [phone]  Name: Colleen [name] MRN: [id number] Date of Birth: 10-15-1965

## 2017-03-07 DIAGNOSIS — H43813 Vitreous degeneration, bilateral: Secondary | ICD-10-CM | POA: Diagnosis not present

## 2017-03-07 DIAGNOSIS — E103411 Type 1 diabetes mellitus with severe nonproliferative diabetic retinopathy with macular edema, right eye: Secondary | ICD-10-CM | POA: Diagnosis not present

## 2017-03-07 DIAGNOSIS — H3582 Retinal ischemia: Secondary | ICD-10-CM | POA: Diagnosis not present

## 2017-03-07 DIAGNOSIS — E103492 Type 1 diabetes mellitus with severe nonproliferative diabetic retinopathy without macular edema, left eye: Secondary | ICD-10-CM | POA: Diagnosis not present

## 2017-03-14 DIAGNOSIS — R0602 Shortness of breath: Secondary | ICD-10-CM | POA: Diagnosis not present

## 2017-03-14 DIAGNOSIS — I10 Essential (primary) hypertension: Secondary | ICD-10-CM | POA: Diagnosis not present

## 2017-03-14 DIAGNOSIS — E1165 Type 2 diabetes mellitus with hyperglycemia: Secondary | ICD-10-CM | POA: Diagnosis not present

## 2017-03-15 ENCOUNTER — Other Ambulatory Visit: Payer: Self-pay | Admitting: Cardiology

## 2017-03-15 DIAGNOSIS — I872 Venous insufficiency (chronic) (peripheral): Secondary | ICD-10-CM

## 2017-03-19 ENCOUNTER — Encounter (HOSPITAL_COMMUNITY): Payer: 59 | Admitting: Physical Therapy

## 2017-03-19 DIAGNOSIS — E10610 Type 1 diabetes mellitus with diabetic neuropathic arthropathy: Secondary | ICD-10-CM | POA: Diagnosis not present

## 2017-03-19 DIAGNOSIS — I10 Essential (primary) hypertension: Secondary | ICD-10-CM | POA: Diagnosis not present

## 2017-03-19 DIAGNOSIS — R109 Unspecified abdominal pain: Secondary | ICD-10-CM | POA: Diagnosis not present

## 2017-03-19 DIAGNOSIS — R319 Hematuria, unspecified: Secondary | ICD-10-CM | POA: Diagnosis not present

## 2017-03-20 DIAGNOSIS — R0602 Shortness of breath: Secondary | ICD-10-CM | POA: Diagnosis not present

## 2017-03-22 ENCOUNTER — Emergency Department (HOSPITAL_COMMUNITY): Payer: 59

## 2017-03-22 ENCOUNTER — Emergency Department (HOSPITAL_BASED_OUTPATIENT_CLINIC_OR_DEPARTMENT_OTHER): Payer: 59

## 2017-03-22 ENCOUNTER — Emergency Department (HOSPITAL_BASED_OUTPATIENT_CLINIC_OR_DEPARTMENT_OTHER)
Admission: EM | Admit: 2017-03-22 | Discharge: 2017-03-22 | Disposition: A | Discharge status: Home / Self Care | Payer: 59 | Attending: Emergency Medicine | Admitting: Emergency Medicine

## 2017-03-22 DIAGNOSIS — E119 Type 2 diabetes mellitus without complications: Secondary | ICD-10-CM | POA: Insufficient documentation

## 2017-03-22 DIAGNOSIS — R4701 Aphasia: Secondary | ICD-10-CM | POA: Diagnosis not present

## 2017-03-22 DIAGNOSIS — J45909 Unspecified asthma, uncomplicated: Secondary | ICD-10-CM | POA: Diagnosis not present

## 2017-03-22 DIAGNOSIS — Z794 Long term (current) use of insulin: Secondary | ICD-10-CM | POA: Insufficient documentation

## 2017-03-22 DIAGNOSIS — Z79899 Other long term (current) drug therapy: Secondary | ICD-10-CM | POA: Diagnosis not present

## 2017-03-22 DIAGNOSIS — R410 Disorientation, unspecified: Secondary | ICD-10-CM | POA: Diagnosis not present

## 2017-03-22 DIAGNOSIS — Z7982 Long term (current) use of aspirin: Secondary | ICD-10-CM | POA: Insufficient documentation

## 2017-03-22 DIAGNOSIS — R4781 Slurred speech: Secondary | ICD-10-CM | POA: Diagnosis present

## 2017-03-22 DIAGNOSIS — N179 Acute kidney failure, unspecified: Secondary | ICD-10-CM

## 2017-03-22 LAB — URINALYSIS, ROUTINE W REFLEX MICROSCOPIC
Bilirubin Urine: NEGATIVE
Glucose, UA: >=500 mg/dL — AB
KETONES UR: NEGATIVE mg/dL
LEUKOCYTES UA: NEGATIVE
NITRITE: NEGATIVE
PH: 5.5 (ref 5.0–8.0)
Specific Gravity, Urine: 1.018 (ref 1.005–1.030)

## 2017-03-22 LAB — BASIC METABOLIC PANEL
Anion gap: 10 (ref 5–15)
BUN: 32 mg/dL — ABNORMAL HIGH (ref 6–20)
CHLORIDE: 98 mmol/L — AB (ref 101–111)
CO2: 25 mmol/L (ref 22–32)
Calcium: 8.5 mg/dL — ABNORMAL LOW (ref 8.9–10.3)
Creatinine, Ser: 1.13 mg/dL — ABNORMAL HIGH (ref 0.44–1.00)
GFR, EST NON AFRICAN AMERICAN: 55 mL/min — AB (ref >60)
Glucose, Bld: 238 mg/dL — ABNORMAL HIGH (ref 65–99)
POTASSIUM: 4.3 mmol/L (ref 3.5–5.1)
SODIUM: 133 mmol/L — AB (ref 135–145)

## 2017-03-22 LAB — URINALYSIS, MICROSCOPIC (REFLEX)

## 2017-03-22 LAB — RAPID URINE DRUG SCREEN, HOSP PERFORMED
AMPHETAMINES: NOT DETECTED
BENZODIAZEPINES: NOT DETECTED
Barbiturates: NOT DETECTED
COCAINE: NOT DETECTED
OPIATES: NOT DETECTED
Tetrahydrocannabinol: NOT DETECTED

## 2017-03-22 LAB — DIFFERENTIAL
BASOS ABS: 0 10*3/uL (ref 0.0–0.1)
BASOS PCT: 0 %
Eosinophils Absolute: 0 10*3/uL (ref 0.0–0.7)
Eosinophils Relative: 0 %
Lymphocytes Relative: 10 %
Lymphs Abs: 0.7 10*3/uL (ref 0.7–4.0)
Monocytes Absolute: 0.3 10*3/uL (ref 0.1–1.0)
Monocytes Relative: 4 %
NEUTROS ABS: 6.4 10*3/uL (ref 1.7–7.7)
NEUTROS PCT: 86 %

## 2017-03-22 LAB — CBC
HEMATOCRIT: 37.5 % (ref 36.0–46.0)
Hemoglobin: 13.3 g/dL (ref 12.0–15.0)
MCH: 29.1 pg (ref 26.0–34.0)
MCHC: 35.5 g/dL (ref 30.0–36.0)
MCV: 82.1 fL (ref 78.0–100.0)
PLATELETS: 285 10*3/uL (ref 150–400)
RBC: 4.57 MIL/uL (ref 3.87–5.11)
RDW: 13.3 % (ref 11.5–15.5)
WBC: 7.2 10*3/uL (ref 4.0–10.5)

## 2017-03-22 LAB — PROTIME-INR
INR: 0.97
Prothrombin Time: 12.9 seconds (ref 11.4–15.2)

## 2017-03-22 LAB — CBG MONITORING, ED: Glucose-Capillary: 213 mg/dL — ABNORMAL HIGH (ref 65–99)

## 2017-03-22 LAB — APTT: APTT: 23 s — AB (ref 24–36)

## 2017-03-22 LAB — I-STAT CG4 LACTIC ACID, ED: Lactic Acid, Venous: 1.45 mmol/L (ref 0.5–1.9)

## 2017-03-22 LAB — TROPONIN I: Troponin I: 0.03 ng/mL (ref <0.03)

## 2017-03-22 MED ORDER — SODIUM CHLORIDE 0.9 % IV BOLUS (SEPSIS)
1000.0000 mL | Freq: Once | INTRAVENOUS | Status: AC
  Administered 2017-03-22: 1000 mL via INTRAVENOUS

## 2017-03-22 MED ORDER — ASPIRIN EC 81 MG PO TBEC: 81.0000 mg | DELAYED_RELEASE_TABLET | Freq: Every day | ORAL | 0 refills | Status: DC

## 2017-03-22 NOTE — ED Notes (Signed)
Date and time results received: 03/22/17 1435 (use smartphrase ".now" to insert current time)  Test:Troponin Critical Value: 0.03  Name of Provider Notified:Dr. Manus Rudd, RN  Orders Received? Or Actions Taken?: No new orders received.

## 2017-03-22 NOTE — ED Provider Notes (Signed)
[facility] DEPT Provider Note   CSN: [id number] Arrival date & time: 03/22/17  1216     History   Chief Complaint Chief Complaint  Patient presents with  . Weakness    HPI Colleen [name] is a 51 y.o. female.  HPI Received patient in transfer from [facility]. Reportedly had vomiting that began around midnight last night. This morning patient was much more sedate and had slurred speech. Reportedly had trouble getting the words out and confused words in her head. Generalized weakness but not localizing. Went by car to [employer]. Reportedly said speech is still slurred at that time. However the notes from [facility] are not done yet. Had negative head CT there. Transferred for here for further evaluation. Speech has improved. Less worn out than she was earlier. Had vomited numerous times last night. No real headache. Last weekend he also felt bad and had some vomiting at that time. No chest pain. No abdominal pain. Reportedly had talked to neurology prior to transfer. Past Medical History:  Diagnosis Date  . Asthma   . Complication of anesthesia    Psuedocholinerasterase deficiency per pt  . Diabetes mellitus    TYPE II  . Diabetic macular edema (HCC)    Pt gets injections in eyes with Eyelea  . Seasonal allergies     Patient Active Problem List   Diagnosis Date Noted  . Chronic venous insufficiency 01/19/2017  . Type II diabetes mellitus ([place]) 01/17/2012  . Serum cholinesterase deficiency ([place]) 01/17/2012  . Premature menopause 01/17/2012  . GERD (gastroesophageal reflux disease)   . Asthma     Past Surgical History:  Procedure Laterality Date  . BACK SURGERY  2009  . CATARACT SURGERY     BILATERAL  . CESAREAN SECTION     X2  . ORIF ANKLE FRACTURE Left 03/23/2016   Procedure: OPEN REDUCTION INTERNAL FIXATION (ORIF) ANKLE FRACTURE MALLEOLUS  MALUNION WITH TIBIAL AND FIBULAR OSTEOTOMY AND AUTOGRAFT;  Surgeon: Wylene Simmer, MD;  Location: [facility];  Service: Orthopedics;  Laterality: Left;    OB History    Gravida Para Term Preterm AB Living   2 2 2     2    SAB TAB Ectopic Multiple Live Births           2       Home Medications    Prior to Admission medications   Medication Sig Start Date End Date Taking? Authorizing Provider  Insulin Aspart (NOVOLOG FLEXPEN [place]) Inject 12 Units into the skin daily. Sliding scale    Yes [provider]  insulin NPH Human (HUMULIN N,NOVOLIN N) 100 UNIT/ML injection Inject 10-15 Units into the skin See admin instructions. Take 10 units every morning  Take 15 units at bedtime   Yes [provider]  lisinopril (PRINIVIL,ZESTRIL) 2.5 MG tablet Take 5 mg by mouth daily.    Yes [provider]  loratadine (CLARITIN) 10 MG tablet Take 10 mg by mouth daily as needed for allergies.    Yes [provider]  metoprolol succinate (TOPROL-XL) 50 MG 24 hr tablet Take 50 mg by mouth daily. Take with or immediately following a meal.   Yes [provider]  aspirin EC 81 MG tablet Take 1 tablet (81 mg total) by mouth daily. 03/22/17   Davonna Belling, MD  furosemide (LASIX) 20 MG tablet Take 20 mg by mouth daily. 03/14/17   [provider]    Family History Family  History  Problem Relation Age of Onset  . Hypertension Father   . Hypertension Paternal Aunt   . Hypertension Paternal Grandmother   . Hypertension Paternal Grandfather     Social History Social History  Substance Use Topics  . Smoking status: Former Smoker    Quit date: 01/17/1987  . Smokeless tobacco: Never Used  . Alcohol use Yes     Comment: occ     Allergies   Patient has no known allergies.   Review of Systems Review of Systems  Constitutional: Positive for appetite change and fatigue.  HENT: Negative for congestion.   Eyes: Negative for photophobia.  Respiratory: Negative for shortness of breath.   Cardiovascular: Negative for chest pain.    Gastrointestinal: Positive for nausea and vomiting.  Endocrine: Negative for polyuria.  Genitourinary: Negative for hematuria.  Musculoskeletal: Negative for back pain.  Skin: Negative for rash.  Neurological: Positive for speech difficulty and headaches.  Hematological: Negative for adenopathy.     Physical Exam Updated Vital Signs BP 133/66 (BP Location: Right Arm)   Pulse 66   Temp 98.4 F (36.9 C)   Resp 18   SpO2 99%   Physical Exam  Constitutional: She is oriented to person, place, and time. She appears well-developed.  HENT:  Head: Atraumatic.  Eyes: EOM are normal. Pupils are equal, round, and reactive to light.  Neck: Neck supple.  Cardiovascular: Normal rate.   Pulmonary/Chest: Effort normal.  Abdominal: Soft. There is no tenderness.  Neurological: She is alert and oriented to person, place, and time. No cranial nerve deficit.  Good grip strength bilaterally. Moving all extremities. Able to identify husband speak appropriately. Did have trouble searching for the word January. 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  Skin: Skin is warm. Capillary refill takes less than 2 seconds.     ED Treatments / Results  Labs (all labs ordered are listed, but only abnormal results are displayed) Labs Reviewed  BASIC METABOLIC PANEL - Abnormal; Notable for the following:       Result Value   Sodium 133 (*)    Chloride 98 (*)    Glucose, Bld 238 (*)    BUN 32 (*)    Creatinine, Ser 1.13 (*)    Calcium 8.5 (*)    GFR calc non Af Amer 55 (*)    All other components within normal limits  URINALYSIS, ROUTINE W REFLEX MICROSCOPIC - Abnormal; Notable for the following:    Glucose, UA >=500 (*)    Hgb urine dipstick MODERATE (*)    Protein, ur >300 (*)    All other components within normal limits  APTT - Abnormal; Notable for the following:    aPTT 23 (*)    All other  components within normal limits  TROPONIN I - Abnormal; Notable for the following:    Troponin I 0.03 (*)    All other components within normal limits  URINALYSIS, MICROSCOPIC (REFLEX) - Abnormal; Notable for the following:    Bacteria, UA FEW (*)    Squamous Epithelial / LPF 0-5 (*)    All other components within normal limits  CBG MONITORING, ED - Abnormal; Notable for the following:    Glucose-Capillary 213 (*)    All other components within normal limits  CBC  PROTIME-INR  DIFFERENTIAL  RAPID URINE DRUG SCREEN, HOSP PERFORMED  I-STAT CG4 LACTIC ACID, ED  EKG  EKG Interpretation  Date/Time:  Thursday March 22 2017 12:51:28 EDT Ventricular Rate:  62 PR Interval:    QRS Duration: 90 QT Interval:  459 QTC Calculation: 467 R Axis:   72 Text Interpretation:  Sinus rhythm Low voltage, precordial leads Consider anterior infarct No acute changes No significant change since last tracing Confirmed by Varney Biles [phone]) on 03/22/2017 1:40:43 PM       Radiology Ct Head Wo Contrast  Result Date: 03/22/2017 CLINICAL DATA:  51 year old female with altered mental status, confusion, slurred speech and vomiting EXAM: CT HEAD WITHOUT CONTRAST TECHNIQUE: Contiguous axial images were obtained from the base of the skull through the vertex without intravenous contrast. COMPARISON:  None. FINDINGS: Brain: No evidence of acute infarction, hemorrhage, hydrocephalus, extra-axial collection or mass lesion/mass effect. Mild but age advanced cerebral cortical volume loss. Vascular: No hyperdense vessel or unexpected calcification. Skull: Normal. Negative for fracture or focal lesion. Sinuses/Orbits: No acute finding. Other: None. IMPRESSION: 1. No acute intracranial abnormality. 2. Mild but advanced for age cerebral cortical volume loss. Electronically Signed   By: Jacqulynn Cadet M.D.   On: 03/22/2017 13:32   Mr Angiogram Head Wo Contrast  Result Date: 03/22/2017 CLINICAL DATA:  Speech  difficulties.  Vomiting and confusion. EXAM: MRI HEAD WITHOUT CONTRAST MRA HEAD WITHOUT CONTRAST TECHNIQUE: Multiplanar, multiecho pulse sequences of the brain and surrounding structures were obtained without intravenous contrast. Angiographic images of the head were obtained using MRA technique without contrast. COMPARISON:  Head CT 03/22/2017 FINDINGS: MRI HEAD FINDINGS Brain: The midline structures are normal. No focal diffusion restriction to indicate acute infarct. No intraparenchymal hemorrhage. The brain parenchymal signal is normal. No mass lesion. No chronic microhemorrhage or cerebral amyloid angiopathy. No hydrocephalus, age advanced atrophy or lobar predominant volume loss. No dural abnormality or extra-axial collection. Skull and upper cervical spine: The visualized skull base, calvarium, upper cervical spine and extracranial soft tissues are normal. Sinuses/Orbits: No fluid levels or advanced mucosal thickening. No mastoid effusion. Normal orbits. MRA HEAD FINDINGS Intracranial internal carotid arteries: Normal. Anterior cerebral arteries: Normal. Middle cerebral arteries: Normal. Posterior communicating arteries: Absent bilaterally. Posterior cerebral arteries: Normal. Basilar artery: Normal. Vertebral arteries: Codominant. Normal. Superior cerebellar arteries: Normal. Anterior inferior cerebellar arteries: Normal. Posterior inferior cerebellar arteries: Normal on the left. IMPRESSION: Normal MRI and MRA of the brain. Electronically Signed   By: Ulyses Jarred M.D.   On: 03/22/2017 22:07   Mr Brain Wo Contrast  Result Date: 03/22/2017 CLINICAL DATA:  Speech difficulties.  Vomiting and confusion. EXAM: MRI HEAD WITHOUT CONTRAST MRA HEAD WITHOUT CONTRAST TECHNIQUE: Multiplanar, multiecho pulse sequences of the brain and surrounding structures were obtained without intravenous contrast. Angiographic images of the head were obtained using MRA technique without contrast. COMPARISON:  Head CT 03/22/2017  FINDINGS: MRI HEAD FINDINGS Brain: The midline structures are normal. No focal diffusion restriction to indicate acute infarct. No intraparenchymal hemorrhage. The brain parenchymal signal is normal. No mass lesion. No chronic microhemorrhage or cerebral amyloid angiopathy. No hydrocephalus, age advanced atrophy or lobar predominant volume loss. No dural abnormality or extra-axial collection. Skull and upper cervical spine: The visualized skull base, calvarium, upper cervical spine and extracranial soft tissues are normal. Sinuses/Orbits: No fluid levels or advanced mucosal thickening. No mastoid effusion. Normal orbits. MRA HEAD FINDINGS Intracranial internal carotid arteries: Normal. Anterior cerebral arteries: Normal. Middle cerebral arteries: Normal. Posterior communicating arteries: Absent bilaterally. Posterior cerebral arteries: Normal. Basilar artery: Normal. Vertebral arteries: Codominant. Normal. Superior cerebellar arteries: Normal. Anterior inferior  cerebellar arteries: Normal. Posterior inferior cerebellar arteries: Normal on the left. IMPRESSION: Normal MRI and MRA of the brain. Electronically Signed   By: Ulyses Jarred M.D.   On: 03/22/2017 22:07    Procedures Procedures (including critical care time)  Medications Ordered in ED Medications  sodium chloride 0.9 % bolus 1,000 mL (0 mLs Intravenous Stopped 03/22/17 1508)     Initial Impression / Assessment and Plan / ED Course  I have reviewed the triage vital signs and the nursing notes.  Pertinent labs & imaging results that were available during my care of the patient were reviewed by me and considered in my medical decision making (see chart for details).     Patient with difficulty speaking. Improved. Seen by neurology. MRI negative. Discharge home. Will start aspirin have patient follow with neurology.  Final Clinical Impressions(s) / ED Diagnoses   Final diagnoses:  Expressive aphasia  AKI (acute kidney injury) [facility])     New Prescriptions Discharge Medication List as of 03/22/2017 10:55 PM       Davonna Belling, MD 03/23/17 [phone]

## 2017-03-22 NOTE — ED Notes (Signed)
Per husband, pt began vomiting last night. Pt husband states he found pt to be lethargic with slurred speech at 11 this morning.

## 2017-03-22 NOTE — Consult Note (Signed)
NEURO HOSPITALIST CONSULT NOTE   Requestig physician: Dr. Alvino Chapel  Reason for Consult: Transient speech deficit  History obtained from:  Patient and Chart    HPI:                                                                                                                                          Colleen [name] is an 51 y.o. female who presents as a transfer from [facility] with transient speech deficit. Symptoms began after she had nausea with several episodes of vomiting at home last night. She developed slurred speech as well as intermittent pauses mid-sentence followed by incomprehensible sounds. Also described as "trouble getting the words out". Speech was otherwise fluent when speech pauses were not occurring. She may have had a somewhat sedated affect per report but had not been on any sedating medications. She went by car to [facility] where CT head was negative.    Recently prescribed a diuretic for lower extremity swelling. She states that over the last 2 days she lost about 20 lbs of weight due to aggressive diuresis. She was told that her electrolytes were abnormal at a check up. Lytes in the ED were abnormal, with Na of 133, BUN/Cr of 32/1.13 and glucose of 238.  Past Medical History:  Diagnosis Date  . Asthma   . Complication of anesthesia    Psuedocholinerasterase deficiency per pt  . Diabetes mellitus    TYPE II  . Diabetic macular edema (HCC)    Pt gets injections in eyes with Eyelea  . Seasonal allergies     Past Surgical History:  Procedure Laterality Date  . BACK SURGERY  2009  . CATARACT SURGERY     BILATERAL  . CESAREAN SECTION     X2  . ORIF ANKLE FRACTURE Left 03/23/2016   Procedure: OPEN REDUCTION INTERNAL FIXATION (ORIF) ANKLE FRACTURE MALLEOLUS  MALUNION WITH TIBIAL AND FIBULAR OSTEOTOMY AND AUTOGRAFT;  Surgeon: Wylene Simmer, MD;  Location: [place];  Service: Orthopedics;  Laterality: Left;    Family History  Problem  Relation Age of Onset  . Hypertension Father   . Hypertension Paternal Aunt   . Hypertension Paternal Grandmother   . Hypertension Paternal Grandfather    Social History:  reports that she quit smoking about 30 years ago. She has never used smokeless tobacco. She reports that she drinks alcohol. She reports that she does not use drugs.  No Known Allergies  HOME MEDICATIONS:  furosemide (LASIX) 20 MG tablet Take 20 mg by mouth daily. [provider] Needs Review  Insulin Aspart (NOVOLOG FLEXPEN [place]) Inject 12 Units into the skin daily. Sliding scale  [provider] Needs Review  insulin NPH Human (HUMULIN N,NOVOLIN N) 100 UNIT/ML injection Inject 10-15 Units into the skin See admin instructions. Take 10 units every morning  Take 15 units at bedtime [provider] Needs Review  lisinopril (PRINIVIL,ZESTRIL) 2.5 MG tablet Take 5 mg by mouth daily.  [provider] Needs Review  loratadine (CLARITIN) 10 MG tablet Take 10 mg by mouth daily as needed for allergies.  [provider] Needs Review  metoprolol succinate (TOPROL-XL) 50 MG 24 hr tablet Take 50 mg by mouth daily. Take with or immediately following a meal. [provider] Needs Review  aspirin EC 81 MG tablet Take 1 tablet (81 mg total) by mouth 2 (two) times daily. Corky Sing, PA-C Needs Review    ROS:                                                                                                                                       No headache, chest pain, abdominal pain, vision loss, limb weakness or limb numbness. Other ROS as per HPI.   Blood pressure 133/66, pulse 66, temperature 98.4 F (36.9 C), resp. rate 18, SpO2 99 %.   General Examination:                                                                                                      HEENT:  [place]/AT Lungs: Respirations unlabored Ext: Warm and well perfused. Evidence for recent diuresis with wrinkled skin over feet and lower legs. Some residual edema noted.   Neurological Examination Mental Status: Alert, oriented, thought content appropriate.  Speech fluent without evidence of aphasia.  Able to follow all commands without difficulty. Cranial Nerves: II:  Visual fields intact. PERRL  III,IV, VI: ptosis not present, EOMI without nystagmus V,VII: smile symmetric, facial temp sensation normal bilaterally VIII: hearing normal bilaterally IX,X: palate rises symmetrically XI: bilateral shoulder shrug XII: midline tongue extension Motor: Right : Upper extremity   5/5    Left:     Upper extremity   5/5  Lower extremity   5/5     Lower extremity   5/5 Sensory: Temp and light touch intact throughout, bilaterally Deep Tendon Reflexes: 2+ and symmetric upper extremities with hypoactive lower extremities Plantars: Right: downgoing  Left: downgoing Cerebellar: No  ataxia with FNF bilaterally Gait: Deferred  Lab Results: Basic Metabolic Panel:  Recent Labs Lab 03/22/17 1255  NA 133*  K 4.3  CL 98*  CO2 25  GLUCOSE 238*  BUN 32*  CREATININE 1.13*  CALCIUM 8.5*    Liver Function Tests: No results for input(s): AST, ALT, ALKPHOS, BILITOT, PROT, ALBUMIN in the last 168 hours. No results for input(s): LIPASE, AMYLASE in the last 168 hours. No results for input(s): AMMONIA in the last 168 hours.  CBC:  Recent Labs Lab 03/22/17 1255  WBC 7.2  NEUTROABS 6.4  HGB 13.3  HCT 37.5  MCV 82.1  PLT 285    Cardiac Enzymes:  Recent Labs Lab 03/22/17 1255  TROPONINI 0.03*    Lipid Panel: No results for input(s): CHOL, TRIG, HDL, CHOLHDL, VLDL, LDLCALC in the last 168 hours.  CBG:  Recent Labs Lab 03/22/17 [place]    Microbiology: No results found for this or any previous visit.  Coagulation Studies:  Recent Labs  03/22/17 1255  LABPROT 12.9  INR  0.97    Imaging: Ct Head Wo Contrast  Result Date: 03/22/2017 CLINICAL DATA:  51 year old female with altered mental status, confusion, slurred speech and vomiting EXAM: CT HEAD WITHOUT CONTRAST TECHNIQUE: Contiguous axial images were obtained from the base of the skull through the vertex without intravenous contrast. COMPARISON:  None. FINDINGS: Brain: No evidence of acute infarction, hemorrhage, hydrocephalus, extra-axial collection or mass lesion/mass effect. Mild but age advanced cerebral cortical volume loss. Vascular: No hyperdense vessel or unexpected calcification. Skull: Normal. Negative for fracture or focal lesion. Sinuses/Orbits: No acute finding. Other: None. IMPRESSION: 1. No acute intracranial abnormality. 2. Mild but advanced for age cerebral cortical volume loss. Electronically Signed   By: Jacqulynn Cadet M.D.   On: 03/22/2017 13:32   Mr Angiogram Head Wo Contrast  Result Date: 03/22/2017 CLINICAL DATA:  Speech difficulties.  Vomiting and confusion. EXAM: MRI HEAD WITHOUT CONTRAST MRA HEAD WITHOUT CONTRAST TECHNIQUE: Multiplanar, multiecho pulse sequences of the brain and surrounding structures were obtained without intravenous contrast. Angiographic images of the head were obtained using MRA technique without contrast. COMPARISON:  Head CT 03/22/2017 FINDINGS: MRI HEAD FINDINGS Brain: The midline structures are normal. No focal diffusion restriction to indicate acute infarct. No intraparenchymal hemorrhage. The brain parenchymal signal is normal. No mass lesion. No chronic microhemorrhage or cerebral amyloid angiopathy. No hydrocephalus, age advanced atrophy or lobar predominant volume loss. No dural abnormality or extra-axial collection. Skull and upper cervical spine: The visualized skull base, calvarium, upper cervical spine and extracranial soft tissues are normal. Sinuses/Orbits: No fluid levels or advanced mucosal thickening. No mastoid effusion. Normal orbits. MRA HEAD FINDINGS  Intracranial internal carotid arteries: Normal. Anterior cerebral arteries: Normal. Middle cerebral arteries: Normal. Posterior communicating arteries: Absent bilaterally. Posterior cerebral arteries: Normal. Basilar artery: Normal. Vertebral arteries: Codominant. Normal. Superior cerebellar arteries: Normal. Anterior inferior cerebellar arteries: Normal. Posterior inferior cerebellar arteries: Normal on the left. IMPRESSION: Normal MRI and MRA of the brain. Electronically Signed   By: Ulyses Jarred M.D.   On: 03/22/2017 22:07   Mr Brain Wo Contrast  Result Date: 03/22/2017 CLINICAL DATA:  Speech difficulties.  Vomiting and confusion. EXAM: MRI HEAD WITHOUT CONTRAST MRA HEAD WITHOUT CONTRAST TECHNIQUE: Multiplanar, multiecho pulse sequences of the brain and surrounding structures were obtained without intravenous contrast. Angiographic images of the head were obtained using MRA technique without contrast. COMPARISON:  Head CT 03/22/2017 FINDINGS: MRI HEAD FINDINGS Brain: The midline structures are normal.  No focal diffusion restriction to indicate acute infarct. No intraparenchymal hemorrhage. The brain parenchymal signal is normal. No mass lesion. No chronic microhemorrhage or cerebral amyloid angiopathy. No hydrocephalus, age advanced atrophy or lobar predominant volume loss. No dural abnormality or extra-axial collection. Skull and upper cervical spine: The visualized skull base, calvarium, upper cervical spine and extracranial soft tissues are normal. Sinuses/Orbits: No fluid levels or advanced mucosal thickening. No mastoid effusion. Normal orbits. MRA HEAD FINDINGS Intracranial internal carotid arteries: Normal. Anterior cerebral arteries: Normal. Middle cerebral arteries: Normal. Posterior communicating arteries: Absent bilaterally. Posterior cerebral arteries: Normal. Basilar artery: Normal. Vertebral arteries: Codominant. Normal. Superior cerebellar arteries: Normal. Anterior inferior cerebellar  arteries: Normal. Posterior inferior cerebellar arteries: Normal on the left. IMPRESSION: Normal MRI and MRA of the brain. Electronically Signed   By: Ulyses Jarred M.D.   On: 03/22/2017 22:07    Assessment: 51 year old female with transient dysphasia and memory disturbance 1. Symptoms resolved except for mild difficulty with recall of recently acquired information. 2. Neurological examination is normal.  3. MRI/MRA brain negative.  4. Symptoms of uncertain etiology. The pattern of the described symptoms is not consistent with a receptive or expressive aphasia. Most likely secondary to mild metabolic encephalopathy in the setting of rapid fluid shifts with recent diuretic use.     Recommendations: 1. Outpatient Neurology follow up. 2. Will need to be closely followed by PCP as well 3. Start ASA 81 mg po qd   Electronically signed: Dr. Kerney Elbe 03/22/2017, 11:45 PM

## 2017-03-22 NOTE — ED Notes (Signed)
Last known normal 2330 03/21/17

## 2017-03-22 NOTE — ED Notes (Signed)
ED Provider at bedside. 

## 2017-03-22 NOTE — ED Triage Notes (Signed)
Husband states she started vomiting last night. This am she has been confused and having slurred speech. She is hard to wake up at triage. Opens her eyes with stimulation.

## 2017-03-22 NOTE — ED Notes (Addendum)
In with MD to do NIH scale, per MD NIH is 0.

## 2017-03-26 ENCOUNTER — Encounter (HOSPITAL_COMMUNITY): Payer: 59 | Admitting: Physical Therapy

## 2017-03-26 ENCOUNTER — Encounter: Payer: Self-pay | Admitting: Physical Therapy

## 2017-03-26 DIAGNOSIS — R0602 Shortness of breath: Secondary | ICD-10-CM | POA: Diagnosis not present

## 2017-03-26 DIAGNOSIS — E119 Type 2 diabetes mellitus without complications: Secondary | ICD-10-CM | POA: Diagnosis not present

## 2017-03-26 DIAGNOSIS — I10 Essential (primary) hypertension: Secondary | ICD-10-CM | POA: Diagnosis not present

## 2017-03-29 ENCOUNTER — Encounter: Payer: Self-pay | Admitting: Physical Therapy

## 2017-03-29 ENCOUNTER — Ambulatory Visit
Admission: RE | Admit: 2017-03-29 | Discharge: 2017-03-29 | Disposition: A | Discharge status: Home / Self Care | Payer: 59 | Source: Ambulatory Visit | Attending: Cardiology | Admitting: Cardiology

## 2017-03-29 ENCOUNTER — Other Ambulatory Visit: Payer: Self-pay | Admitting: Cardiology

## 2017-03-29 DIAGNOSIS — I872 Venous insufficiency (chronic) (peripheral): Secondary | ICD-10-CM

## 2017-03-29 DIAGNOSIS — E119 Type 2 diabetes mellitus without complications: Secondary | ICD-10-CM | POA: Diagnosis not present

## 2017-03-29 DIAGNOSIS — R600 Localized edema: Secondary | ICD-10-CM | POA: Diagnosis not present

## 2017-03-29 NOTE — Consult Note (Signed)
Chief Complaint: Patient was seen in consultation today for  Chief Complaint  Patient presents with  . Venous Insufficiency    E & M for Venous Insufficiency   at the request of Ganji,Jay  Referring Physician(s): Ganji,Jay  History of Present Illness: Colleen [name] is a 51 y.o. female with bilateral lower extremity edema. Patient is diabetic and she was treated for a right foot diabetic ulcer 1 year ago with a Uniboot. After the right foot ulcer resolved, the patient was found to have a left fifth metatarsal fracture and was treated with a cast. After the cast was removed, the patient was found to have complex left ankle fracture with involvement of the calcaneus and talus. Patient had surgical fixation approximately 1 year ago. During the patient's rehabilitation, she is noted to have significant swelling in the lower extremity, left side greater than right. She had a venous insufficiency workup by Dr. Bridgett Larsson in Vascular Surgery. Reportedly, she had no significant venous insufficiency and she even had a pelvic CT to rule out May Thurner disease. Due to patient's neuropathy, she does not complain of any significant pain in the lower extremities but she does notice worsening edema throughout the day. 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 At the end of the day, it can be difficult ambulate due to the swelling. The swelling does improve at night and after elevation. She gets minimal relief using compression stockings. She has been using 20-30 mmHg compression stockings.  Patient does not complain of bleeding or any new large ulcerations. She says that the swelling has been much worse in the past but has improved now that she is on diuretics.  Past Medical History:  Diagnosis Date  . Asthma   . Complication of anesthesia    Psuedocholinerasterase deficiency per pt  . Diabetes mellitus    TYPE II  . Diabetic macular edema (HCC)    Pt gets injections in eyes with  Eyelea  . Seasonal allergies     Past Surgical History:  Procedure Laterality Date  . BACK SURGERY  2009  . CATARACT SURGERY     BILATERAL  . CESAREAN SECTION     X2  . ORIF ANKLE FRACTURE Left 03/23/2016   Procedure: OPEN REDUCTION INTERNAL FIXATION (ORIF) ANKLE FRACTURE MALLEOLUS  MALUNION WITH TIBIAL AND FIBULAR OSTEOTOMY AND AUTOGRAFT;  Surgeon: Wylene Simmer, MD;  Location: [place];  Service: Orthopedics;  Laterality: Left;    Allergies: Patient has no known allergies.  Medications: Prior to Admission medications   Medication Sig Start Date End Date Taking? Authorizing Provider  aspirin EC 81 MG tablet Take 1 tablet (81 mg total) by mouth daily. 03/22/17  Yes Davonna Belling, MD  Insulin Aspart (NOVOLOG FLEXPEN [place]) Inject 12 Units into the skin daily. Sliding scale    Yes [provider]  insulin NPH Human (HUMULIN N,NOVOLIN N) 100 UNIT/ML injection Inject 10-15 Units into the skin See admin instructions. Take 10 units every morning  Take 15 units at bedtime   Yes [provider]  metoprolol succinate (TOPROL-XL) 50 MG 24 hr tablet Take 50 mg by mouth daily. Take with or immediately following a meal.   Yes [provider]  furosemide (LASIX) 20 MG tablet Take 20 mg by mouth daily. 03/14/17   [provider]  lisinopril (PRINIVIL,ZESTRIL) 2.5 MG tablet Take 5 mg by mouth daily.     [provider]  loratadine (  CLARITIN) 10 MG tablet Take 10 mg by mouth daily as needed for allergies.     [provider]     Family History  Problem Relation Age of Onset  . Hypertension Father   . Hypertension Paternal Aunt   . Hypertension Paternal Grandmother   . Hypertension Paternal Grandfather     Social History   Social History  . Marital status: Married    Spouse name: N/A  . Number of children: N/A  . Years of education: N/A   Social History Main Topics  . Smoking status: Former Smoker    Quit date:  01/17/1987  . Smokeless tobacco: Never Used  . Alcohol use Yes     Comment: occ  . Drug use: No  . Sexual activity: Yes     Comment: PATIENT'S PARTNER WITH VASECTOMY   Other Topics Concern  . Not on file   Social History Narrative  . No narrative on file      Review of Systems  Respiratory: Positive for shortness of breath.   Musculoskeletal:       Leg swelling, left greater than right.    Vital Signs: BP (!) 176/64   Pulse (!) 50   Temp 98 F (36.7 C) (Oral)   Resp 14   Ht 5\' 3"  (1.6 m)   Wt 175 lb (79.4 kg)   SpO2 100%   BMI 31.00 kg/m   Physical Exam  Cardiovascular: Intact distal pulses.   Musculoskeletal: She exhibits edema.  Bilateral lower extremity swelling, left greater than right. Left leg swelling is most prominent at the ankle and foot.  No large varicosities.  Small spider veins along the left thigh and behind the left knee.  Small round wound at the left great toe. No discharge. No significant erythema.      Imaging: Ct Head Wo Contrast  Result Date: 03/22/2017 CLINICAL DATA:  51 year old female with altered mental status, confusion, slurred speech and vomiting EXAM: CT HEAD WITHOUT CONTRAST TECHNIQUE: Contiguous axial images were obtained from the base of the skull through the vertex without intravenous contrast. COMPARISON:  None. FINDINGS: Brain: No evidence of acute infarction, hemorrhage, hydrocephalus, extra-axial collection or mass lesion/mass effect. Mild but age advanced cerebral cortical volume loss. Vascular: No hyperdense vessel or unexpected calcification. Skull: Normal. Negative for fracture or focal lesion. Sinuses/Orbits: No acute finding. Other: None. IMPRESSION: 1. No acute intracranial abnormality. 2. Mild but advanced for age cerebral cortical volume loss. Electronically Signed   By: Jacqulynn Cadet M.D.   On: 03/22/2017 13:32   Mr Angiogram Head Wo Contrast  Result Date: 03/22/2017 CLINICAL DATA:  Speech difficulties.   Vomiting and confusion. EXAM: MRI HEAD WITHOUT CONTRAST MRA HEAD WITHOUT CONTRAST TECHNIQUE: Multiplanar, multiecho pulse sequences of the brain and surrounding structures were obtained without intravenous contrast. Angiographic images of the head were obtained using MRA technique without contrast. COMPARISON:  Head CT 03/22/2017 FINDINGS: MRI HEAD FINDINGS Brain: The midline structures are normal. No focal diffusion restriction to indicate acute infarct. No intraparenchymal hemorrhage. The brain parenchymal signal is normal. No mass lesion. No chronic microhemorrhage or cerebral amyloid angiopathy. No hydrocephalus, age advanced atrophy or lobar predominant volume loss. No dural abnormality or extra-axial collection. Skull and upper cervical spine: The visualized skull base, calvarium, upper cervical spine and extracranial soft tissues are normal. Sinuses/Orbits: No fluid levels or advanced mucosal thickening. No mastoid effusion. Normal orbits. MRA HEAD FINDINGS Intracranial internal carotid arteries: Normal. Anterior cerebral arteries: Normal. Middle cerebral arteries: Normal.  Posterior communicating arteries: Absent bilaterally. Posterior cerebral arteries: Normal. Basilar artery: Normal. Vertebral arteries: Codominant. Normal. Superior cerebellar arteries: Normal. Anterior inferior cerebellar arteries: Normal. Posterior inferior cerebellar arteries: Normal on the left. IMPRESSION: Normal MRI and MRA of the brain. Electronically Signed   By: Ulyses Jarred M.D.   On: 03/22/2017 22:07   Mr Brain Wo Contrast  Result Date: 03/22/2017 CLINICAL DATA:  Speech difficulties.  Vomiting and confusion. EXAM: MRI HEAD WITHOUT CONTRAST MRA HEAD WITHOUT CONTRAST TECHNIQUE: Multiplanar, multiecho pulse sequences of the brain and surrounding structures were obtained without intravenous contrast. Angiographic images of the head were obtained using MRA technique without contrast. COMPARISON:  Head CT 03/22/2017 FINDINGS: MRI  HEAD FINDINGS Brain: The midline structures are normal. No focal diffusion restriction to indicate acute infarct. No intraparenchymal hemorrhage. The brain parenchymal signal is normal. No mass lesion. No chronic microhemorrhage or cerebral amyloid angiopathy. No hydrocephalus, age advanced atrophy or lobar predominant volume loss. No dural abnormality or extra-axial collection. Skull and upper cervical spine: The visualized skull base, calvarium, upper cervical spine and extracranial soft tissues are normal. Sinuses/Orbits: No fluid levels or advanced mucosal thickening. No mastoid effusion. Normal orbits. MRA HEAD FINDINGS Intracranial internal carotid arteries: Normal. Anterior cerebral arteries: Normal. Middle cerebral arteries: Normal. Posterior communicating arteries: Absent bilaterally. Posterior cerebral arteries: Normal. Basilar artery: Normal. Vertebral arteries: Codominant. Normal. Superior cerebellar arteries: Normal. Anterior inferior cerebellar arteries: Normal. Posterior inferior cerebellar arteries: Normal on the left. IMPRESSION: Normal MRI and MRA of the brain. Electronically Signed   By: Ulyses Jarred M.D.   On: 03/22/2017 22:07   US Venous Img Lower Unilateral Left  Result Date: 03/29/2017 CLINICAL DATA:  51 year old diabetic with history of a left ankle fracture and internal fixation. Patient has significant lower extremity swelling, left side greater than right. Previous pelvic CT excluded May-Thurner disease. Evaluation for lower extremity venous insufficiency. EXAM: BILATERAL LOWER EXTREMITY VENOUS DUPLEX ULTRASOUND TECHNIQUE: Gray-scale sonography with graded compression, as well as color Doppler and duplex ultrasound, were performed to evaluate the deep and superficial veins of both lower extremities. Spectral Doppler was utilized to evaluate flow at rest and with distal augmentation maneuvers. A complete superficial venous insufficiency exam was performed in the upright standing  position. I personally performed the technical portion of the exam. COMPARISON:  None. FINDINGS: Right lower extremity: Deep venous system: Normal compressibility, augmentation and color Doppler flow in the right common femoral vein, right femoral vein and right popliteal vein without thrombus. The right saphenofemoral junction is patent. Right profunda femoral vein is patent without thrombus. Superficial venous system: No reflux in the right great saphenous vein or right short saphenous vein. No varicosities. Left lower extremity: Deep venous system: Normal compressibility, augmentation and color Doppler flow in the left common femoral vein, left femoral vein and left popliteal vein without thrombus. The left saphenofemoral junction is patent. Left profunda femoral vein is patent without thrombus. Superficial venous system: No reflux identified in the left great saphenous vein or the left short saphenous vein. Few prominent veins in the left knee and calf region but no reflux identified in these veins. Subcutaneous edema in the left lower leg and ankle region. Prominent lymph node in the proximal left thigh. IMPRESSION: Negative lower extremity venous duplex examination. Negative for DVT or venous insufficiency in both legs. Electronically Signed   By: Markus Daft M.D.   On: 03/29/2017 09:44   [place] Rad Eval And Mgmt  Result Date: 03/29/2017 Please refer to "Notes" to see  consult details.   Labs:  CBC:  Recent Labs  03/22/17 1255  WBC 7.2  HGB 13.3  HCT 37.5  PLT 285    COAGS:  Recent Labs  03/22/17 1255  INR 0.97  APTT 23*    BMP:  Recent Labs  03/22/17 1255  NA 133*  K 4.3  CL 98*  CO2 25  GLUCOSE 238*  BUN 32*  CALCIUM 8.5*  CREATININE 1.13*  GFRNONAA 55*  GFRAA >60    LIVER FUNCTION TESTS: No results for input(s): BILITOT, AST, ALT, ALKPHOS, PROT, ALBUMIN in the last 8760 hours.  TUMOR MARKERS: No results for input(s): AFPTM, CEA, CA199, CHROMGRNA in the last 8760  hours.  Assessment and Plan:  51 year old female with bilateral lower extremity edema, left greater than right. Symptoms started after the patient developed fractures in the left lower extremity and subsequent internal fixation. Ultrasound examination today shows no evidence for DVT and no significant superficial venous insufficiency. The edema is likely multifactorial related to her diabetes, cardiac disease and previous surgery. No indication for venous treatment. Recommend continued compression stockings and elevation. Patient will follow-up as needed.   Thank you for this interesting consult.  I greatly enjoyed meeting Colleen [name] and look forward to participating in their care.  A copy of this report was sent to the requesting provider on this date.  Electronically Signed: Carylon Perches 03/29/2017, 9:50 AM   I spent a total of  15 Minutes   in face to face in clinical consultation, greater than 50% of which was counseling/coordinating care for lower extremity edema.

## 2017-04-02 ENCOUNTER — Encounter (HOSPITAL_COMMUNITY): Payer: 59 | Admitting: Physical Therapy

## 2017-04-02 DIAGNOSIS — R799 Abnormal finding of blood chemistry, unspecified: Secondary | ICD-10-CM | POA: Diagnosis not present

## 2017-04-02 DIAGNOSIS — E10610 Type 1 diabetes mellitus with diabetic neuropathic arthropathy: Secondary | ICD-10-CM | POA: Diagnosis not present

## 2017-04-02 DIAGNOSIS — R809 Proteinuria, unspecified: Secondary | ICD-10-CM | POA: Diagnosis not present

## 2017-04-04 ENCOUNTER — Encounter (HOSPITAL_COMMUNITY): Payer: 59 | Admitting: Physical Therapy

## 2017-04-04 ENCOUNTER — Ambulatory Visit: Payer: 59

## 2017-04-05 ENCOUNTER — Ambulatory Visit: Payer: 59

## 2017-04-06 ENCOUNTER — Encounter: Payer: Self-pay | Admitting: Physical Therapy

## 2017-04-09 ENCOUNTER — Encounter: Payer: Self-pay | Admitting: Physical Therapy

## 2017-04-09 ENCOUNTER — Encounter (HOSPITAL_COMMUNITY): Payer: 59 | Admitting: Physical Therapy

## 2017-04-10 ENCOUNTER — Ambulatory Visit: Payer: 59 | Admitting: Physical Therapy

## 2017-04-11 ENCOUNTER — Encounter: Payer: Self-pay | Admitting: Physical Therapy

## 2017-04-11 ENCOUNTER — Encounter (HOSPITAL_COMMUNITY): Payer: 59 | Admitting: Physical Therapy

## 2017-04-11 DIAGNOSIS — E875 Hyperkalemia: Secondary | ICD-10-CM | POA: Diagnosis not present

## 2017-04-11 DIAGNOSIS — R809 Proteinuria, unspecified: Secondary | ICD-10-CM | POA: Diagnosis not present

## 2017-04-13 ENCOUNTER — Encounter (HOSPITAL_COMMUNITY): Payer: 59 | Admitting: Physical Therapy

## 2017-04-13 ENCOUNTER — Encounter: Payer: Self-pay | Admitting: Physical Therapy

## 2017-04-17 DIAGNOSIS — R0602 Shortness of breath: Secondary | ICD-10-CM | POA: Diagnosis not present

## 2017-04-17 DIAGNOSIS — I10 Essential (primary) hypertension: Secondary | ICD-10-CM | POA: Diagnosis not present

## 2017-04-17 DIAGNOSIS — R9439 Abnormal result of other cardiovascular function study: Secondary | ICD-10-CM | POA: Diagnosis not present

## 2017-04-18 DIAGNOSIS — I10 Essential (primary) hypertension: Secondary | ICD-10-CM | POA: Diagnosis not present

## 2017-05-02 DIAGNOSIS — R0602 Shortness of breath: Secondary | ICD-10-CM | POA: Diagnosis not present

## 2017-05-02 DIAGNOSIS — E118 Type 2 diabetes mellitus with unspecified complications: Secondary | ICD-10-CM | POA: Diagnosis not present

## 2017-05-03 DIAGNOSIS — E103413 Type 1 diabetes mellitus with severe nonproliferative diabetic retinopathy with macular edema, bilateral: Secondary | ICD-10-CM | POA: Diagnosis not present

## 2017-05-03 DIAGNOSIS — H3561 Retinal hemorrhage, right eye: Secondary | ICD-10-CM | POA: Diagnosis not present

## 2017-05-03 DIAGNOSIS — H43813 Vitreous degeneration, bilateral: Secondary | ICD-10-CM | POA: Diagnosis not present

## 2017-05-03 DIAGNOSIS — H3582 Retinal ischemia: Secondary | ICD-10-CM | POA: Diagnosis not present

## 2017-05-04 DIAGNOSIS — E1065 Type 1 diabetes mellitus with hyperglycemia: Secondary | ICD-10-CM | POA: Diagnosis not present

## 2017-05-04 DIAGNOSIS — E109 Type 1 diabetes mellitus without complications: Secondary | ICD-10-CM | POA: Diagnosis not present

## 2017-05-07 DIAGNOSIS — I129 Hypertensive chronic kidney disease with stage 1 through stage 4 chronic kidney disease, or unspecified chronic kidney disease: Secondary | ICD-10-CM | POA: Diagnosis not present

## 2017-05-07 DIAGNOSIS — R809 Proteinuria, unspecified: Secondary | ICD-10-CM | POA: Diagnosis not present

## 2017-05-07 DIAGNOSIS — N189 Chronic kidney disease, unspecified: Secondary | ICD-10-CM | POA: Diagnosis not present

## 2017-05-07 DIAGNOSIS — R319 Hematuria, unspecified: Secondary | ICD-10-CM | POA: Diagnosis not present

## 2017-05-08 ENCOUNTER — Ambulatory Visit (HOSPITAL_COMMUNITY)
Admission: RE | Admit: 2017-05-08 | Discharge: 2017-05-08 | Disposition: A | Discharge status: Home / Self Care | Payer: 59 | Source: Ambulatory Visit | Attending: Cardiology | Admitting: Cardiology

## 2017-05-08 ENCOUNTER — Encounter (HOSPITAL_COMMUNITY)
Admission: RE | Disposition: A | Discharge status: Home / Self Care | Payer: Self-pay | Source: Ambulatory Visit | Attending: Cardiology

## 2017-05-08 DIAGNOSIS — E1140 Type 2 diabetes mellitus with diabetic neuropathy, unspecified: Secondary | ICD-10-CM | POA: Insufficient documentation

## 2017-05-08 DIAGNOSIS — E785 Hyperlipidemia, unspecified: Secondary | ICD-10-CM | POA: Diagnosis not present

## 2017-05-08 DIAGNOSIS — Z794 Long term (current) use of insulin: Secondary | ICD-10-CM | POA: Insufficient documentation

## 2017-05-08 DIAGNOSIS — I2510 Atherosclerotic heart disease of native coronary artery without angina pectoris: Secondary | ICD-10-CM | POA: Diagnosis not present

## 2017-05-08 DIAGNOSIS — E11311 Type 2 diabetes mellitus with unspecified diabetic retinopathy with macular edema: Secondary | ICD-10-CM | POA: Insufficient documentation

## 2017-05-08 DIAGNOSIS — E11319 Type 2 diabetes mellitus with unspecified diabetic retinopathy without macular edema: Secondary | ICD-10-CM | POA: Insufficient documentation

## 2017-05-08 DIAGNOSIS — R0602 Shortness of breath: Secondary | ICD-10-CM | POA: Diagnosis present

## 2017-05-08 DIAGNOSIS — Z7982 Long term (current) use of aspirin: Secondary | ICD-10-CM | POA: Insufficient documentation

## 2017-05-08 DIAGNOSIS — R079 Chest pain, unspecified: Secondary | ICD-10-CM | POA: Diagnosis not present

## 2017-05-08 DIAGNOSIS — R9439 Abnormal result of other cardiovascular function study: Secondary | ICD-10-CM | POA: Diagnosis not present

## 2017-05-08 DIAGNOSIS — I10 Essential (primary) hypertension: Secondary | ICD-10-CM | POA: Diagnosis not present

## 2017-05-08 DIAGNOSIS — Z79899 Other long term (current) drug therapy: Secondary | ICD-10-CM | POA: Diagnosis not present

## 2017-05-08 HISTORY — PX: LEFT HEART CATH AND CORONARY ANGIOGRAPHY: CATH118249

## 2017-05-08 LAB — GLUCOSE, CAPILLARY
GLUCOSE-CAPILLARY: 218 mg/dL — AB (ref 65–99)
Glucose-Capillary: 221 mg/dL — ABNORMAL HIGH (ref 65–99)
Glucose-Capillary: 208 mg/dL — ABNORMAL HIGH (ref 65–99)

## 2017-05-08 SURGERY — LEFT HEART CATH AND CORONARY ANGIOGRAPHY
Anesthesia: LOCAL

## 2017-05-08 MED ORDER — ASPIRIN 81 MG PO CHEW
CHEWABLE_TABLET | ORAL | Status: AC
  Administered 2017-05-08: 81 mg via ORAL
  Filled 2017-05-08: qty 1

## 2017-05-08 MED ORDER — LIDOCAINE HCL (PF) 1 % IJ SOLN
INTRAMUSCULAR | Status: DC | PRN
  Administered 2017-05-08: 2 mL

## 2017-05-08 MED ORDER — IOPAMIDOL (ISOVUE-370) INJECTION 76%
INTRAVENOUS | Status: AC
  Filled 2017-05-08: qty 100

## 2017-05-08 MED ORDER — SODIUM CHLORIDE 0.9 % IV SOLN: 250.0000 mL | INTRAVENOUS | Status: DC | PRN

## 2017-05-08 MED ORDER — MIDAZOLAM HCL 2 MG/2ML IJ SOLN
INTRAMUSCULAR | Status: AC
  Filled 2017-05-08: qty 2

## 2017-05-08 MED ORDER — HYDRALAZINE HCL 20 MG/ML IJ SOLN
10.0000 mg | Freq: Once | INTRAMUSCULAR | Status: AC
  Administered 2017-05-08: 10 mg via INTRAVENOUS

## 2017-05-08 MED ORDER — VERAPAMIL HCL 2.5 MG/ML IV SOLN
INTRAVENOUS | Status: DC | PRN
  Administered 2017-05-08: 10 mL via INTRA_ARTERIAL

## 2017-05-08 MED ORDER — HEPARIN SODIUM (PORCINE) 1000 UNIT/ML IJ SOLN
INTRAMUSCULAR | Status: AC
  Filled 2017-05-08: qty 1

## 2017-05-08 MED ORDER — SODIUM CHLORIDE 0.9% FLUSH: 3.0000 mL | INTRAVENOUS | Status: DC | PRN

## 2017-05-08 MED ORDER — SODIUM CHLORIDE 0.9% FLUSH: 3.0000 mL | Freq: Two times a day (BID) | INTRAVENOUS | Status: DC

## 2017-05-08 MED ORDER — HEPARIN (PORCINE) IN NACL 2-0.9 UNIT/ML-% IJ SOLN
INTRAMUSCULAR | Status: AC
  Filled 2017-05-08: qty 500

## 2017-05-08 MED ORDER — ASPIRIN 81 MG PO CHEW
81.0000 mg | CHEWABLE_TABLET | ORAL | Status: AC
  Administered 2017-05-08: 81 mg via ORAL

## 2017-05-08 MED ORDER — HYDRALAZINE HCL 20 MG/ML IJ SOLN
INTRAMUSCULAR | Status: AC
  Administered 2017-05-08: 10 mg via INTRAVENOUS
  Filled 2017-05-08: qty 1

## 2017-05-08 MED ORDER — VERAPAMIL HCL 2.5 MG/ML IV SOLN
INTRAVENOUS | Status: AC
  Filled 2017-05-08: qty 2

## 2017-05-08 MED ORDER — SODIUM CHLORIDE 0.9 % WEIGHT BASED INFUSION: 1.0000 mL/kg/h | INTRAVENOUS | Status: DC

## 2017-05-08 MED ORDER — HYDRALAZINE HCL 20 MG/ML IJ SOLN
INTRAMUSCULAR | Status: AC
  Administered 2017-05-08: 20 mg via INTRAVENOUS
  Filled 2017-05-08: qty 1

## 2017-05-08 MED ORDER — SODIUM CHLORIDE 0.9 % WEIGHT BASED INFUSION: 1.0000 mL/kg/h | INTRAVENOUS | Status: AC

## 2017-05-08 MED ORDER — IOPAMIDOL (ISOVUE-370) INJECTION 76%
INTRAVENOUS | Status: DC | PRN
  Administered 2017-05-08: 80 mL via INTRA_ARTERIAL

## 2017-05-08 MED ORDER — FENTANYL CITRATE (PF) 100 MCG/2ML IJ SOLN
INTRAMUSCULAR | Status: AC
  Filled 2017-05-08: qty 2

## 2017-05-08 MED ORDER — LIDOCAINE HCL (PF) 1 % IJ SOLN
INTRAMUSCULAR | Status: AC
  Filled 2017-05-08: qty 30

## 2017-05-08 MED ORDER — HYDRALAZINE HCL 20 MG/ML IJ SOLN
20.0000 mg | Freq: Once | INTRAMUSCULAR | Status: AC
  Administered 2017-05-08: 20 mg via INTRAVENOUS

## 2017-05-08 MED ORDER — HEPARIN SODIUM (PORCINE) 1000 UNIT/ML IJ SOLN
INTRAMUSCULAR | Status: DC | PRN
Start: 2017-05-08 — End: 2017-05-08
  Administered 2017-05-08: 4000 [IU] via INTRAVENOUS

## 2017-05-08 MED ORDER — HEPARIN (PORCINE) IN NACL 2-0.9 UNIT/ML-% IJ SOLN
INTRAMUSCULAR | Status: AC | PRN
  Administered 2017-05-08: 1000 mL via INTRA_ARTERIAL

## 2017-05-08 MED ORDER — FENTANYL CITRATE (PF) 100 MCG/2ML IJ SOLN
INTRAMUSCULAR | Status: DC | PRN
  Administered 2017-05-08: 50 ug via INTRAVENOUS

## 2017-05-08 MED ORDER — NITROGLYCERIN 1 MG/10 ML FOR IR/CATH LAB
INTRA_ARTERIAL | Status: AC
Start: 2017-05-08 — End: 2017-05-08
  Filled 2017-05-08: qty 10

## 2017-05-08 MED ORDER — MIDAZOLAM HCL 2 MG/2ML IJ SOLN
INTRAMUSCULAR | Status: DC | PRN
  Administered 2017-05-08: 2 mg via INTRAVENOUS

## 2017-05-08 MED ORDER — SODIUM CHLORIDE 0.9 % WEIGHT BASED INFUSION
3.0000 mL/kg/h | INTRAVENOUS | Status: DC
  Administered 2017-05-08: 3 mL/kg/h via INTRAVENOUS

## 2017-05-08 SURGICAL SUPPLY — 13 items
CATH EXPO 5F PIG 125CM (CATHETERS) ×2 IMPLANT
CATH EXPO 5FR FR4 (CATHETERS) ×2 IMPLANT
CATH INFINITI 5FR AL1 (CATHETERS) ×2 IMPLANT
CATH OPTITORQUE TIG 4.0 5F (CATHETERS) ×2 IMPLANT
GLIDESHEATH SLEND SS 6F .025 (SHEATH) ×2 IMPLANT
GUIDEWIRE ANGLED .035X150CM (WIRE) ×2 IMPLANT
GUIDEWIRE INQWIRE 1.5J.035X260 (WIRE) ×1 IMPLANT
INQWIRE 1.5J .035X260CM (WIRE) ×2
KIT HEART LEFT (KITS) ×2 IMPLANT
PACK CARDIAC CATHETERIZATION (CUSTOM PROCEDURE TRAY) ×2 IMPLANT
SYR MEDRAD MARK V 150ML (SYRINGE) ×2 IMPLANT
TRANSDUCER W/STOPCOCK (MISCELLANEOUS) ×2 IMPLANT
TUBING CIL FLEX 10 FLL-RA (TUBING) ×2 IMPLANT

## 2017-05-08 NOTE — H&P (Signed)
OFFICE VISIT NOTES COPIED TO EPIC FOR DOCUMENTATION  . History of Present Illness Laverda Page MD; 04/20/2017 5:32 PM) Patient words: Last OV 03/14/2017; FU echo, nuc, labs, venous insuff., pt recently was in E.R.  The patient is a 51 year old female who presents for a Follow-up for Leg swelling.  Additional reasons for visit:  Follow-up for Shortness of breath is described as the following: She presents here for follow-up of shortness of breath and leg edema. She had diabetic right foot ulcer in 2017, was in the [place], started noticing leg edema since then. Unfortunately also had left ankle fracture needing extensive surgery in July 2017, has recuperated well, however has developed persistent bilateral leg edema. She also has lower extremity edema that is significantly worse in the left leg. She works as a Software engineer and reports that by the end of her shift her entire leg is swollen and can hardly bend it. She has undergone extensive testing including venous insufficiency study that showed reflux at the common femoral vein. Workup for May-thurner syndrome has been negative. CT angio of the pelvis on 01/23/2017 showed normal iliac venous system and IVC. Patient reports now being diagnosed with lymphedema and is being worked up for treatment. She underwent lower extremity venous insufficiency study in July 2018 which was negative for venous insufficiency or DVT.  She has had significant diuresis with the combination of metoprolol, lisinopril and furosemide. Her leg edema had improved significantly. She had lost about 14-15 pounds in weight.   Patient was recently seen in the emergency room on 03/22/2017 after she had dysphagia and memory disturbance. MRI and CT of the head were negative. Neurology was consulted. Electrolytes were noted to be abnormal with Sodium level of 133. It was felt symptoms secondary to metabolic encephalopathy from her recent rapid diuresis. She is to follow-up  with them on the outpatient basis. She has started aspirin daily. She underwent evaluation for venous insufficiency, echocardiogram, and nuclear stress testing and now presents for follow up. Lisinopril was discontinued due to hyperkalemia by PCP labs, since then she has started to gain some weight back still weight is down about 9 pounds.  She has hypertension, type 2 diabetes that was previously uncontrolled until one year ago. She is currently on insulin therapy and also has neuropathy and retinopathy from long-standing uncontrolled diabetes. She reports last hemoglobin A1c was 6.8%. She reports cholesterol levels have been well controlled. She is also now followed by GI as on recent abdominal ultrasound she was noted to have coarse liver.   Problem List/Past Medical Anderson Malta Sergeant; 04/17/2017 11:43 AM) Lymphedema (I89.0)  with reflux Diabetic macular edema, both eyes (E11.311)  Neuropathy, leg (G57.90)  Benign essential hypertension (I10)  Uncontrolled type 2 diabetes mellitus without complication, without long-term current use of insulin (E11.65)  currently 1.5 Laboratory examination (Z01.89)  Labs 03/27/27 in: Serum glucose 169, BUN 28, creatinine 0.95, eGFR 69 mL. Potassium 5.6. 02/26/2017: Creatinine 1.0, potassium 5.0, EGFR 58, BMP otherwise normal. CBC normal. Protein 5.7, albumin 3.4, alkaline phosphate 165, hepatic panel otherwise normal. BNP 276. Shortness of breath on exertion (R06.02)  Lexiscan sestamibi stress test 03/26/2017: 1. Patient attempted treadmill stress, terminated after 2 minutes due to dyspnea. Converted to pharmacologic stress. The resting electrocardiogram demonstrated normal sinus rhythm, normal resting conduction, no resting arrhythmias and normal rest repolarization. Stress EKG is non-diagnostic for ischemia as it a pharmacologic stress using Lexiscan. Stress symptoms included dyspnea. 2. The LV is dilated both at rest and  stress images. The LV end diastolic  volume was 419QQ. There is a moderate area of ischemia in the basal anterior, basal anteroseptal, mid anterior, mid anteroseptal and apical anterior myocardial wall(s). Overall left ventricular systolic function was abnormal without regional wall motion abnormalities. The left ventricular ejection fraction was calculated or visually estimated to be 41%. This is a high risk study, consider further cardiac work-up. Bilateral lower extremity edema (R60.0)  Echocardiogram 03/20/2017: Left ventricle cavity is normal in size. Moderate concentric hypertrophy of the left ventricle. Normal global wall motion. Doppler evidence of grade II (pseudonormal) diastolic dysfunction. Diastolic dysfunction suggests elevated LA/LV endiastolic pressure. Calculated EF 61%. Trace tricuspid regurgitation. Unable to estimate PA pressure due to absence/minimal TR signal. Venous duplex ultrasound by [facility] 03/29/2017: Negative for DVT or venous insufficiency in both legs. IVC is dilated with blunted respiratory response. Suggests elevated central venous pressure.  Allergies Anderson Malta Sergeant; 04/17/2017 11:43 AM) No Known Drug Allergies [03/14/2017]:  Family History Anderson Malta Sergeant; 04/17/2017 11:43 AM) Mother  In poor health. parkinson's, no heart attacks or strokes, no known cardiovascular conditions Father  In good health. HTN, no heart attacks or strokes, no known cardiovascular conditions Brother 1  In good health. no heart attacks or strokes, no known cardiovascular conditions  Social History Anderson Malta Sergeant; 04/17/2017 11:43 AM) Current tobacco use  Never smoker. Alcohol Use  Occasional alcohol use. wine Marital status  Married. Living Situation  Lives with spouse. Number of Children  2.  Past Surgical History Anderson Malta Sergeant; 04/17/2017 11:43 AM) Arthroscopic Ankle Surgery - Left [02/2016]: Back Surgery [1995]: reptured disc Cesarean Delivery [1996]: 10-1990, 1996  Medication  History Anderson Malta Sergeant; 04/17/2017 11:54 AM) Metoprolol Succinate ER (50MG Tablet ER 24HR, 1 (one) Tablet Oral daily, Taken starting 03/14/2017) Active. Furosemide (20MG Tablet, 1 (one) Tablet Oral daily, Taken starting 03/14/2017) Active. HumaLOG KwikPen (100UNIT/ML Soln Pen-inj, Subcutaneous sliding scale) Active. Eylea (2MG/0.05ML Solution, Intraocular evey 8-12 weeks) Active. ProAir HFA (108 (90 Base)MCG/ACT Aerosol Soln, Inhalation prn) Active. HumuLIN N KwikPen (100UNIT/ML Susp Pen-inj, 10u am Subcutaneous 15u pm) Active. Aspirin (81MG Tablet DR, 1 Oral daily) Active. Medications Reconciled (verbally with pt)  Diagnostic Studies History Anderson Malta Sergeant; 04/17/2017 11:52 AM) Echocardiogram [03/20/2017]: Left ventricle cavity is normal in size. Moderate concentric hypertrophy of the left ventricle. Normal global wall motion. Doppler evidence of grade II (pseudonormal) diastolic dysfunction. Diastolic dysfunction suggests elevated LA/LV endiastolic pressure. Calculated EF 61%. Trace tricuspid regurgitation. Unable to estimate PA pressure due to absence/minimal TR signal. IVC is dilated with blunted respiratory response. Suggests elevated central venous pressure. Nuclear stress test [03/26/2017]: 1. Patient attempted treadmill stress, terminated after 2 minutes due to dyspnea. Converted to pharmacologic stress. The resting electrocardiogram demonstrated normal sinus rhythm, normal resting conduction, no resting arrhythmias and normal rest repolarization. Stress EKG is non-diagnostic for ischemia as it a pharmacologic stress using Lexiscan. Stress symptoms included dyspnea. 2. The LV is dilated both at rest and stress images. The LV end diastolic volume was 229NL. There is a moderate area of ischemia in the basal anterior, basal anteroseptal, mid anterior, mid anteroseptal and apical anterior myocardial wall(s). Overall left ventricular systolic function was abnormal without regional  wall motion abnormalities. The left ventricular ejection fraction was calculated or visually estimated to be 41%. This is a high risk study, consider further cardiac work-up MRI Brain, Brain Stem [02/2017]:    Review of Systems Laverda Page MD; 04/20/2017 5:18 PM) General Not Present- Appetite Loss and Weight Gain. Respiratory Not Present-  Chronic Cough and Wakes up from Sleep Wheezing or Short of Breath. Cardiovascular Present- Difficulty Breathing On Exertion and Edema. Not Present- Chest Pain, Difficulty Breathing Lying Down, Orthopnea and Paroxysmal Nocturnal Dyspnea. Gastrointestinal Not Present- Black, Tarry Stool and Difficulty Swallowing. Musculoskeletal Present- Back Pain. Not Present- Decreased Range of Motion and Muscle Atrophy. Neurological Not Present- Attention Deficit. Psychiatric Not Present- Personality Changes and Suicidal Ideation. Endocrine Not Present- Cold Intolerance and Heat Intolerance. Hematology Not Present- Abnormal Bleeding. All other systems negative  Vitals Anderson Malta Sergeant; 04/17/2017 11:56 AM) 04/17/2017 11:46 AM Weight: 179.38 lb Height: 63in Body Surface Area: 1.85 m Body Mass Index: 31.77 kg/m  Pulse: 52 (Regular)  P.OX: 99% (Room air) BP: 202/90 (Sitting, Left Arm, Standard)       Physical Exam Laverda Page, MD; 04/20/2017 4:53 PM) General Mental Status-Alert. General Appearance-Cooperative and Appears stated age. Build & Nutrition-Moderately built.  Head and Neck Thyroid Gland Characteristics - normal size and consistency and no palpable nodules.  Chest and Lung Exam Chest and lung exam reveals -quiet, even and easy respiratory effort with no use of accessory muscles, non-tender and on auscultation, normal breath sounds, no adventitious sounds.  Cardiovascular Cardiovascular examination reveals -carotid auscultation reveals no bruits and abdominal aorta auscultation reveals no bruits and no prominent  pulsation. Auscultation Heart Sounds - S4.  Abdomen Palpation/Percussion Normal exam - Non Tender and No hepatosplenomegaly.  Peripheral Vascular Lower Extremity Palpation - Edema - Right - 2+ Pitting edema. Bilateral - 3+ Pitting edema. Femoral pulse - Bilateral - Normal. Popliteal pulse - Bilateral - Normal. Dorsalis pedis pulse - Bilateral - Normal. Posterior tibial pulse - Bilateral - Normal.  Neurologic Neurologic evaluation reveals -alert and oriented x 3 with no impairment of recent or remote memory. Motor-Grossly intact without any focal deficits.  Musculoskeletal Global Assessment Left Lower Extremity - no deformities, masses or tenderness, no known fractures. Right Lower Extremity - no deformities, masses or tenderness, no known fractures.  Echocardiogram 03/20/2017: Left ventricle cavity is normal in size. Moderate concentric hypertrophy of the left ventricle. Normal global wall motion. Doppler evidence of grade II (pseudonormal) diastolic dysfunction. Diastolic dysfunction suggests elevated LA/LV endiastolic pressure. Calculated EF 61%. Trace tricuspid regurgitation. Unable to estimate PA pressure due to absence/minimal TR signal. IVC is dilated with blunted respiratory response. Suggests elevated central venous pressure.  Performed: 03/20/2017 3:48 PM Lexiscan sestamibi stress test 03/26/2017: 1. Patient attempted treadmill stress, terminated after 2 minutes due to dyspnea. Converted to pharmacologic stress. The resting electrocardiogram demonstrated normal sinus rhythm, normal resting conduction, no resting arrhythmias and normal rest repolarization. Stress EKG is non-diagnostic for ischemia as it a pharmacologic stress using Lexiscan. Stress symptoms included dyspnea. 2. The LV is dilated both at rest and stress images. The LV end diastolic volume was 947SJ. There is a moderate area of ischemia in the basal anterior, basal anteroseptal, mid anterior, mid anteroseptal  and apical anterior myocardial wall(s). Overall left ventricular systolic function was abnormal without regional wall motion abnormalities. The left ventricular ejection fraction was calculated or visually estimated to be 41%. This is a high risk study, consider further cardiac work-up.  Performed: 03/26/2017 3:48 PM Bilateral venous duplex scan of extremities (62836) Comments: Venous duplex ultrasound 03/29/2017: Negative for DVT or venous insufficiency in both legs.  Performed: 03/29/2017 10:13 AM  Assessment & Plan Laverda Page MD; 04/20/2017 5:34 PM) Abnormal nuclear stress test (R94.39) Story: Lexiscan sestamibi stress test 03/26/2017: 1. Patient attempted treadmill stress, terminated after 2 minutes due to  dyspnea. Converted to pharmacologic stress. The resting electrocardiogram demonstrated normal sinus rhythm, normal resting conduction, no resting arrhythmias and normal rest repolarization. Stress EKG is non-diagnostic for ischemia as it a pharmacologic stress using Lexiscan. Stress symptoms included dyspnea. 2. The LV is dilated both at rest and stress images. The LV end diastolic volume was 269SW. There is a moderate area of ischemia in the basal anterior, basal anteroseptal, mid anterior, mid anteroseptal and apical anterior myocardial wall(s). Overall left ventricular systolic function was abnormal without regional wall motion abnormalities. The left ventricular ejection fraction was calculated or visually estimated to be 41%. This is a high risk study, consider further cardiac work-up. Shortness of breath on exertion (R06.02) Future Plans 01/26/6269: METABOLIC PANEL, BASIC (35009) - one time 04/30/2017: CBC & PLATELETS (AUTO) [phone]) - one time 04/30/2017: PT (PROTHROMBIN TIME) (99371) - one time 04/30/2017: TSH (THYROID STIMULATING HORMONE) (69678) - one time Benign essential hypertension (I10) Current Plans LIPID PANEL (93810) Uncontrolled type 2 diabetes mellitus with complication,  with long-term current use of insulin (E11.8) Future Plans 04/30/2017: HEMOGLOBIN GLYCLATED (HGB A1C) (17510) - one time Bilateral lower extremity edema (R60.0) Story: Echocardiogram 03/20/2017: Left ventricle cavity is normal in size. Moderate concentric hypertrophy of the left ventricle. Normal global wall motion. Doppler evidence of grade II (pseudonormal) diastolic dysfunction. Diastolic dysfunction suggests elevated LA/LV endiastolic pressure. Calculated EF 61%. Trace tricuspid regurgitation. Unable to estimate PA pressure due to absence/minimal TR signal. IVC is dilated with blunted respiratory response. Suggests elevated central venous pressure.  Venous duplex ultrasound by [facility] 03/29/2017: Negative for DVT or venous insufficiency in both legs. Current Plans Started Spironolactone 25MG, 1 (one) Tablet every morning, #30, 04/17/2017, Ref. x1. Local Order: Discontinue Lisinopril Changed Furosemide 20MG, 1 (one) Tablet every morning as needed, #30, 30 days starting 04/17/2017, Ref. x2. Laboratory examination (C58.52) Story: Lipid profile 04/18/2017: Total cholesterol 194, triglycerides 43, HDL 104, LDL 81.  Labs 03/26/2017 in: Serum glucose 169, BUN 28, creatinine 0.95, eGFR 69 mL. Potassium 5.6.  02/26/2017: Creatinine 1.0, potassium 5.0, EGFR 58, BMP otherwise normal. CBC normal. Protein 5.7, albumin 3.4, alkaline phosphate 165, hepatic panel otherwise normal. BNP 276.  Current Plans I suspect her leg edema may have been related to her recent surgery with fractured tibia and femur that required extensive surgeryand also due to autonomic diabetic insufficiency.She is negative for venous insufficiency.She has severe dyspnea even with minimal activity. She has class 3 symptoms.  On questioning, patient has been using salt substitute since the day she saw me and hence I suspect her hyperkalemia was probably related to excessive use of salt substitute. Due to persistent  leg edema, I would prefer to use spironolactone keeping a very close watch on potassium and serum creatinine. We will hold off on lisinopril for now. BP is elevated. Also advised her to use furosemide on a p.r.n. basis instead of using it on a daily basis. Extensive discussion regarding appropriate fluid restriction and also keeping her leg elevated and avoidance of salty food was discussed and clearly she has had excessive salt intake from the type of foods she has been eating.  I reviewed the results of the stress test and echocardiogram, she has high risk stress test and I suspect she probably has high-grade LAD stenosis. She needs coronary angiography. I'll obtain lipids, TSH and A1c and send a carbon copy toDr. Hassan Buckler on n.p.o. status tomorrow morning but she is also aware that she will need repeat BMP in about 10  days to 2 weeks prior to coronary angiography especially since starting spironolactone.Schedule for cardiac catheterization, and possible angioplasty. We discussed regarding risks, benefits, alternatives to this including stress testing, CTA and continued medical therapy. Patient wants to proceed. Understands <1-2% risk of death, stroke, MI, urgent CABG, bleeding, infection, renal failure but not limited to these. This was a greater than 40 minute office visit with greater than 50% of the time spent with face-to-face encounter with patient and evaluation of complex medical issues, review of external records and coordination of care.  CC: Christain Sacramento, MD; Hassan Buckler, MD  Addendum Sunday Spillers MD; 05/03/2017 10:19 PM) Labs 05/02/2017: A1c 6.8%, serum glucose 226, BUN 62, creatinine 1.37, eGFR 44/51 mL, potassium 5.0. HB 10.0/HCT 31.2, normal indicis. Platelets 215. Pro time normal, TSH normal.  Addendum Note(Jagadeesh REinar Gip MD; 05/03/2017 10:20 PM) Labs 05/02/2017: A1c 8.8%, serum glucose 226, BUN 62, creatinine 1.37, eGFR 44/51 mL, potassium 5.0. HB 10.0/HCT 31.2,  normal indicis. Platelets 215. Pro time normal, TSH normal.  Signed by Laverda Page, MD (04/20/2017 5:34 PM)

## 2017-05-08 NOTE — Interval H&P Note (Signed)
History and Physical Interval Note:  05/08/2017 1:10 PM  Colleen [name]  has presented today for surgery, with the diagnosis of abnormal stress test, sob  The various methods of treatment have been discussed with the patient and family. After consideration of risks, benefits and other options for treatment, the patient has consented to  Procedure(s): Left Heart Cath and Coronary Angiography (N/A) as a surgical intervention .  The patient's history has been reviewed, patient examined, no change in status, stable for surgery.  I have reviewed the patient's chart and labs.  Questions were answered to the patient's satisfaction.    Patient Information:   1-2V CAD, no prox LAD U (6) Indication: 16; Score 6 1111 Patient Information:   CTO of 1 vessel, no other CAD U (6) Indication: 26; Score 6 1110 Patient Information:   1V CAD with prox LAD A (7) Indication: 23; Score 7 1112 Patient Information:   2V-CAD with prox LAD A (8) Indication: 38; Score 8 1113 Patient Information:   3V-CAD without LMCA A (8) Indication: 44; Score 8 1116 Patient Information:   3V-CAD without LMCA  With Abnormal LV systolic function A (9) Indication: 48; Score 9 1117 Patient Information:   LMCA-CAD A (9) Indication: 49; Score 9 1122 Patient Information:   2V-CAD with prox LAD  PCI A (7) Indication: 62; Score 7 1115 Patient Information:   2V-CAD with prox LAD  CABG A (8) Indication: 62; Score 8 1114 Patient Information:   3V-CAD without LMCA  With Low CAD burden(i.e., 3 focal stenoses, low SYNTAX score)  PCI A (7) Indication: 63; Score 7 1119 Patient Information:   3V-CAD without LMCA  With Low CAD burden(i.e., 3 focal stenoses, low SYNTAX score)  CABG A (9) Indication: 63; Score 9 1118 Patient Information:   3V-CAD without LMCA  E06c - Intermediate-high CAD burden (i.e., multiple diffuse lesions, presence of CTO, or high SYNTAX score)  PCI U (4)  Indication: 64; Score 4 1121 Patient Information:   3V-CAD without LMCA  E06c - Intermediate-high CAD burden (i.e., multiple diffuse lesions, presence of CTO, or high SYNTAX score)  CABG A (9) Indication: 64; Score 9 1120 Patient Information:   LMCA-CAD  With Isolated LMCA stenosis  PCI U (6) Indication: 65; Score 6 1124 Patient Information:   LMCA-CAD  Additional CAD, low CAD burden (i.e., 1- to 2-vessel additional involvement, low SYNTAX score)  PCI U (5) Indication: 66; Score 5 1126 Patient Information:   LMCA-CAD  Additional CAD, low CAD burden (i.e., 1- to 2-vessel additional involvement, low SYNTAX score)  CABG A (9) Indication: 66; Score 9 1125 Patient Information:   LMCA-CAD  With Isolated LMCA stenosis  CABG A (9) Indication: 66; Score 9 1123 Patient Information:   LMCA-CAD  Additional CAD, intermediate-high CAD burden (i.e., 3-vessel involvement, presence of CTO, or high SYNTAX score)  PCI I (3) Indication: 67; Score 3 1128 Patient Information:   LMCA-CAD  Additional CAD, intermediate-high CAD burden (i.e., 3-vessel involvement, presence of CTO, or high SYNTAX score)  CABG A (9) Indication: 67; Score 9    Mahir Prabhakar J Alby Schwabe

## 2017-05-08 NOTE — Discharge Instructions (Signed)

## 2017-05-09 ENCOUNTER — Encounter (HOSPITAL_COMMUNITY): Payer: Self-pay | Admitting: Cardiology

## 2017-05-15 ENCOUNTER — Ambulatory Visit: Payer: 59 | Admitting: Vascular Surgery

## 2017-05-17 DIAGNOSIS — R9439 Abnormal result of other cardiovascular function study: Secondary | ICD-10-CM | POA: Diagnosis not present

## 2017-05-17 DIAGNOSIS — I2510 Atherosclerotic heart disease of native coronary artery without angina pectoris: Secondary | ICD-10-CM | POA: Diagnosis not present

## 2017-05-17 DIAGNOSIS — R0602 Shortness of breath: Secondary | ICD-10-CM | POA: Diagnosis not present

## 2017-05-22 ENCOUNTER — Encounter (HOSPITAL_COMMUNITY): Payer: 59

## 2017-05-22 ENCOUNTER — Ambulatory Visit: Payer: 59 | Admitting: Vascular Surgery

## 2017-05-23 DIAGNOSIS — R319 Hematuria, unspecified: Secondary | ICD-10-CM | POA: Diagnosis not present

## 2017-05-23 DIAGNOSIS — N183 Chronic kidney disease, stage 3 (moderate): Secondary | ICD-10-CM | POA: Diagnosis not present

## 2017-05-23 DIAGNOSIS — R809 Proteinuria, unspecified: Secondary | ICD-10-CM | POA: Diagnosis not present

## 2017-05-24 ENCOUNTER — Other Ambulatory Visit (HOSPITAL_COMMUNITY): Payer: Self-pay | Admitting: Nephrology

## 2017-05-24 DIAGNOSIS — N183 Chronic kidney disease, stage 3 unspecified: Secondary | ICD-10-CM

## 2017-06-11 ENCOUNTER — Other Ambulatory Visit: Payer: Self-pay | Admitting: Student

## 2017-06-12 ENCOUNTER — Ambulatory Visit (HOSPITAL_COMMUNITY)
Admission: RE | Admit: 2017-06-12 | Discharge: 2017-06-12 | Disposition: A | Discharge status: Home / Self Care | Payer: 59 | Source: Ambulatory Visit | Attending: Nephrology | Admitting: Nephrology

## 2017-06-12 ENCOUNTER — Encounter (HOSPITAL_COMMUNITY): Payer: Self-pay

## 2017-06-12 DIAGNOSIS — Z794 Long term (current) use of insulin: Secondary | ICD-10-CM | POA: Insufficient documentation

## 2017-06-12 DIAGNOSIS — E11311 Type 2 diabetes mellitus with unspecified diabetic retinopathy with macular edema: Secondary | ICD-10-CM | POA: Insufficient documentation

## 2017-06-12 DIAGNOSIS — E1122 Type 2 diabetes mellitus with diabetic chronic kidney disease: Secondary | ICD-10-CM | POA: Insufficient documentation

## 2017-06-12 DIAGNOSIS — N183 Chronic kidney disease, stage 3 unspecified: Secondary | ICD-10-CM

## 2017-06-12 DIAGNOSIS — J45909 Unspecified asthma, uncomplicated: Secondary | ICD-10-CM | POA: Insufficient documentation

## 2017-06-12 DIAGNOSIS — E11610 Type 2 diabetes mellitus with diabetic neuropathic arthropathy: Secondary | ICD-10-CM | POA: Insufficient documentation

## 2017-06-12 DIAGNOSIS — Z7982 Long term (current) use of aspirin: Secondary | ICD-10-CM | POA: Insufficient documentation

## 2017-06-12 DIAGNOSIS — Z87891 Personal history of nicotine dependence: Secondary | ICD-10-CM | POA: Diagnosis not present

## 2017-06-12 DIAGNOSIS — R809 Proteinuria, unspecified: Secondary | ICD-10-CM | POA: Insufficient documentation

## 2017-06-12 DIAGNOSIS — R319 Hematuria, unspecified: Secondary | ICD-10-CM | POA: Diagnosis not present

## 2017-06-12 LAB — PROTIME-INR
INR: 1.03
Prothrombin Time: 13.5 seconds (ref 11.4–15.2)

## 2017-06-12 LAB — CBC
HCT: 32.5 % — ABNORMAL LOW (ref 36.0–46.0)
Hemoglobin: 10.8 g/dL — ABNORMAL LOW (ref 12.0–15.0)
MCH: 28 pg (ref 26.0–34.0)
MCHC: 33.2 g/dL (ref 30.0–36.0)
MCV: 84.2 fL (ref 78.0–100.0)
PLATELETS: 224 10*3/uL (ref 150–400)
RBC: 3.86 MIL/uL — AB (ref 3.87–5.11)
RDW: 13.2 % (ref 11.5–15.5)
WBC: 4.9 10*3/uL (ref 4.0–10.5)

## 2017-06-12 LAB — GLUCOSE, CAPILLARY
Glucose-Capillary: 178 mg/dL — ABNORMAL HIGH (ref 65–99)
Glucose-Capillary: 155 mg/dL — ABNORMAL HIGH (ref 65–99)

## 2017-06-12 LAB — APTT: aPTT: 30 seconds (ref 24–36)

## 2017-06-12 MED ORDER — FENTANYL CITRATE (PF) 100 MCG/2ML IJ SOLN
INTRAMUSCULAR | Status: AC
  Filled 2017-06-12: qty 2

## 2017-06-12 MED ORDER — MIDAZOLAM HCL 2 MG/2ML IJ SOLN
INTRAMUSCULAR | Status: AC | PRN
  Administered 2017-06-12 (×2): 1 mg via INTRAVENOUS

## 2017-06-12 MED ORDER — LIDOCAINE HCL (PF) 1 % IJ SOLN
INTRAMUSCULAR | Status: AC
  Filled 2017-06-12: qty 30

## 2017-06-12 MED ORDER — FENTANYL CITRATE (PF) 100 MCG/2ML IJ SOLN
INTRAMUSCULAR | Status: AC | PRN
  Administered 2017-06-12: 50 ug via INTRAVENOUS

## 2017-06-12 MED ORDER — MIDAZOLAM HCL 2 MG/2ML IJ SOLN
INTRAMUSCULAR | Status: AC
  Filled 2017-06-12: qty 2

## 2017-06-12 MED ORDER — HYDROCODONE-ACETAMINOPHEN 5-325 MG PO TABS: 1.0000 | ORAL_TABLET | ORAL | Status: DC | PRN

## 2017-06-12 MED ORDER — SODIUM CHLORIDE 0.9 % IV SOLN: INTRAVENOUS | Status: DC

## 2017-06-12 NOTE — H&P (Signed)
Chief Complaint: Patient was seen in consultation today for random renal biopsy at the request of [facility]  Referring Physician(s): Upton,Elizabeth  Supervising Physician: Markus Daft  Patient Status: [facility] - Out-pt  History of Present Illness: Colleen [name] is a 51 y.o. female   CKD stage 3 Follows with PMD---DM; charcot foot B lower extr edema Noted renal functions change Proteinuria and hematuria/RBC casts Referred to Dr Laurena Bering Now scheduled for random renal biopsy  Past Medical History:  Diagnosis Date  . Asthma   . Complication of anesthesia    Psuedocholinerasterase deficiency per pt  . Diabetes mellitus    TYPE II  . Diabetic macular edema (HCC)    Pt gets injections in eyes with Eyelea  . Seasonal allergies     Past Surgical History:  Procedure Laterality Date  . BACK SURGERY  2009  . CATARACT SURGERY     BILATERAL  . CESAREAN SECTION     X2  . LEFT HEART CATH AND CORONARY ANGIOGRAPHY N/A 05/08/2017   Procedure: Left Heart Cath and Coronary Angiography;  Surgeon: Nigel Mormon, MD;  Location: [facility];  Service: Cardiovascular;  Laterality: N/A;  . ORIF ANKLE FRACTURE Left 03/23/2016   Procedure: OPEN REDUCTION INTERNAL FIXATION (ORIF) ANKLE FRACTURE MALLEOLUS  MALUNION WITH TIBIAL AND FIBULAR OSTEOTOMY AND AUTOGRAFT;  Surgeon: Wylene Simmer, MD;  Location: [facility];  Service: Orthopedics;  Laterality: Left;    Allergies: Patient has no known allergies.  Medications: Prior to Admission medications   Medication Sig Start Date End Date Taking? Authorizing Provider  furosemide (LASIX) 20 MG tablet Take 20 mg by mouth daily as needed for edema.  03/14/17  Yes [provider]  insulin aspart (NOVOLOG) 100 UNIT/ML injection Inject 2-10 Units into the skin 5 (five) times daily as needed for high blood sugar. Sliding scale   Yes [provider]  insulin NPH Human (HUMULIN N,NOVOLIN N) 100 UNIT/ML  injection Inject 10-15 Units into the skin See admin instructions. Take 10 units every morning  Take 15 units at bedtime   Yes [provider]  metoprolol succinate (TOPROL-XL) 50 MG 24 hr tablet Take 50 mg by mouth daily. Take with or immediately following a meal.   Yes [provider]  oxycodone (OXY-IR) 5 MG capsule Take 5 mg by mouth every 6 (six) hours as needed.   Yes [provider]  spironolactone (ALDACTONE) 25 MG tablet Take 25 mg by mouth daily.   Yes [provider]  albuterol (PROVENTIL HFA;VENTOLIN HFA) 108 (90 Base) MCG/ACT inhaler Inhale 1-2 puffs into the lungs every 6 (six) hours as needed for wheezing or shortness of breath.    [provider]  aspirin EC 81 MG tablet Take 1 tablet (81 mg total) by mouth daily. Patient taking differently: Take 81 mg by mouth at bedtime.  03/22/17   Davonna Belling, MD     Family History  Problem Relation Age of Onset  . Hypertension Father   . Hypertension Paternal Aunt   . Hypertension Paternal Grandmother   . Hypertension Paternal Grandfather     Social History   Social History  . Marital status: Married    Spouse name: N/A  . Number of children: N/A  . Years of education: N/A   Social History Main Topics  . Smoking status: Former Smoker    Quit date: 01/17/1987  . Smokeless tobacco: Never Used  . Alcohol use Yes     Comment:  occ  . Drug use: No  . Sexual activity: Yes     Comment: PATIENT'S PARTNER WITH VASECTOMY   Other Topics Concern  . None   Social History Narrative  . None    Review of Systems: A 12 point ROS discussed and pertinent positives are indicated in the HPI above.  All other systems are negative.  Review of Systems  Constitutional: Positive for fatigue. Negative for activity change, appetite change, fever and unexpected weight change.  Respiratory: Negative for shortness of breath.   Cardiovascular: Positive for palpitations and leg swelling. Negative for  chest pain.  Neurological: Negative for weakness.  Psychiatric/Behavioral: Negative for behavioral problems and confusion.    Vital Signs: BP (!) 181/66   Pulse (!) 52   Temp 98.2 F (36.8 C) (Oral)   Resp 16   Ht 5\' 3"  (1.6 m)   Wt 168 lb (76.2 kg)   SpO2 100%   BMI 29.76 kg/m   Physical Exam  Constitutional: She is oriented to person, place, and time.  Cardiovascular: Normal rate, regular rhythm and normal heart sounds.   Pulmonary/Chest: Effort normal and breath sounds normal.  Abdominal: Soft. Bowel sounds are normal. There is no tenderness.  Musculoskeletal: Normal range of motion.  Neurological: She is alert and oriented to person, place, and time.  Skin: Skin is warm and dry.  Psychiatric: She has a normal mood and affect. Her behavior is normal. Judgment and thought content normal.  Nursing note and vitals reviewed.   Imaging: No results found.  Labs:  CBC:  Recent Labs  03/22/17 1255 06/12/17 0620  WBC 7.2 4.9  HGB 13.3 10.8*  HCT 37.5 32.5*  PLT 285 224    COAGS:  Recent Labs  03/22/17 1255  INR 0.97  APTT 23*    BMP:  Recent Labs  03/22/17 1255  NA 133*  K 4.3  CL 98*  CO2 25  GLUCOSE 238*  BUN 32*  CALCIUM 8.5*  CREATININE 1.13*  GFRNONAA 55*  GFRAA >60    LIVER FUNCTION TESTS: No results for input(s): BILITOT, AST, ALT, ALKPHOS, PROT, ALBUMIN in the last 8760 hours.  TUMOR MARKERS: No results for input(s): AFPTM, CEA, CA199, CHROMGRNA in the last 8760 hours.  Assessment and Plan:  CKD3 DM Lower extr edema; proteinuria; hematuria-red cell casts Scheduled now for random renal biopsy Risks and benefits discussed with the patient including, but not limited to bleeding, infection, damage to adjacent structures or low yield requiring additional tests. All of the patient's questions were answered, patient is agreeable to proceed. Consent signed and in chart.   Thank you for this interesting consult.  I greatly enjoyed  meeting Teresia A Rostro and look forward to participating in their care.  A copy of this report was sent to the requesting provider on this date.  Electronically Signed: Lavonia Drafts, PA-C 06/12/2017, 7:03 AM   I spent a total of  30 Minutes   in face to face in clinical consultation, greater than 50% of which was counseling/coordinating care for random renal biopsy

## 2017-06-12 NOTE — Sedation Documentation (Signed)
Patient is resting comfortably. 

## 2017-06-12 NOTE — Sedation Documentation (Signed)
Patient denies pain and is resting comfortably.  

## 2017-06-12 NOTE — Discharge Instructions (Addendum)

## 2017-06-12 NOTE — Procedures (Signed)
US guided biopsy of left kidney lower pole.  2 cores obtained.  Small hematoma at biopsy site but no active bleeding at end of procedure.  No immediate complication.  Minimal blood loss.

## 2017-06-13 DIAGNOSIS — I2510 Atherosclerotic heart disease of native coronary artery without angina pectoris: Secondary | ICD-10-CM | POA: Diagnosis not present

## 2017-06-13 DIAGNOSIS — E109 Type 1 diabetes mellitus without complications: Secondary | ICD-10-CM | POA: Diagnosis not present

## 2017-06-13 DIAGNOSIS — E559 Vitamin D deficiency, unspecified: Secondary | ICD-10-CM | POA: Diagnosis not present

## 2017-06-13 DIAGNOSIS — R0602 Shortness of breath: Secondary | ICD-10-CM | POA: Diagnosis not present

## 2017-06-13 DIAGNOSIS — I10 Essential (primary) hypertension: Secondary | ICD-10-CM | POA: Diagnosis not present

## 2017-06-13 DIAGNOSIS — E02 Subclinical iodine-deficiency hypothyroidism: Secondary | ICD-10-CM | POA: Diagnosis not present

## 2017-06-19 ENCOUNTER — Encounter (HOSPITAL_COMMUNITY): Payer: Self-pay

## 2017-06-20 DIAGNOSIS — N183 Chronic kidney disease, stage 3 (moderate): Secondary | ICD-10-CM | POA: Diagnosis not present

## 2017-06-20 DIAGNOSIS — Z23 Encounter for immunization: Secondary | ICD-10-CM | POA: Diagnosis not present

## 2017-06-20 DIAGNOSIS — R809 Proteinuria, unspecified: Secondary | ICD-10-CM | POA: Diagnosis not present

## 2017-06-20 DIAGNOSIS — E109 Type 1 diabetes mellitus without complications: Secondary | ICD-10-CM | POA: Diagnosis not present

## 2017-06-20 DIAGNOSIS — E1129 Type 2 diabetes mellitus with other diabetic kidney complication: Secondary | ICD-10-CM | POA: Diagnosis not present

## 2017-06-20 DIAGNOSIS — I129 Hypertensive chronic kidney disease with stage 1 through stage 4 chronic kidney disease, or unspecified chronic kidney disease: Secondary | ICD-10-CM | POA: Diagnosis not present

## 2017-06-22 ENCOUNTER — Encounter (HOSPITAL_COMMUNITY): Payer: Self-pay

## 2017-06-22 DIAGNOSIS — E1065 Type 1 diabetes mellitus with hyperglycemia: Secondary | ICD-10-CM | POA: Diagnosis not present

## 2017-06-22 DIAGNOSIS — E109 Type 1 diabetes mellitus without complications: Secondary | ICD-10-CM | POA: Diagnosis not present

## 2017-06-27 DIAGNOSIS — R600 Localized edema: Secondary | ICD-10-CM | POA: Diagnosis not present

## 2017-06-27 DIAGNOSIS — H3561 Retinal hemorrhage, right eye: Secondary | ICD-10-CM | POA: Diagnosis not present

## 2017-06-27 DIAGNOSIS — M546 Pain in thoracic spine: Secondary | ICD-10-CM | POA: Diagnosis not present

## 2017-06-27 DIAGNOSIS — H43813 Vitreous degeneration, bilateral: Secondary | ICD-10-CM | POA: Diagnosis not present

## 2017-06-27 DIAGNOSIS — E103413 Type 1 diabetes mellitus with severe nonproliferative diabetic retinopathy with macular edema, bilateral: Secondary | ICD-10-CM | POA: Diagnosis not present

## 2017-06-27 DIAGNOSIS — G8929 Other chronic pain: Secondary | ICD-10-CM | POA: Diagnosis not present

## 2017-06-27 DIAGNOSIS — H43822 Vitreomacular adhesion, left eye: Secondary | ICD-10-CM | POA: Diagnosis not present

## 2017-06-27 DIAGNOSIS — E11311 Type 2 diabetes mellitus with unspecified diabetic retinopathy with macular edema: Secondary | ICD-10-CM | POA: Diagnosis not present

## 2017-07-03 DIAGNOSIS — N183 Chronic kidney disease, stage 3 (moderate): Secondary | ICD-10-CM | POA: Diagnosis not present

## 2017-07-05 DIAGNOSIS — R600 Localized edema: Secondary | ICD-10-CM | POA: Diagnosis not present

## 2017-07-11 DIAGNOSIS — N183 Chronic kidney disease, stage 3 (moderate): Secondary | ICD-10-CM | POA: Diagnosis not present

## 2017-07-18 DIAGNOSIS — R600 Localized edema: Secondary | ICD-10-CM | POA: Diagnosis not present

## 2017-07-18 DIAGNOSIS — M14672 Charcot's joint, left ankle and foot: Secondary | ICD-10-CM | POA: Diagnosis not present

## 2017-08-01 DIAGNOSIS — E1129 Type 2 diabetes mellitus with other diabetic kidney complication: Secondary | ICD-10-CM | POA: Diagnosis not present

## 2017-08-01 DIAGNOSIS — I129 Hypertensive chronic kidney disease with stage 1 through stage 4 chronic kidney disease, or unspecified chronic kidney disease: Secondary | ICD-10-CM | POA: Diagnosis not present

## 2017-08-01 DIAGNOSIS — N183 Chronic kidney disease, stage 3 (moderate): Secondary | ICD-10-CM | POA: Diagnosis not present

## 2017-08-06 DIAGNOSIS — E109 Type 1 diabetes mellitus without complications: Secondary | ICD-10-CM | POA: Diagnosis not present

## 2017-08-13 DIAGNOSIS — E109 Type 1 diabetes mellitus without complications: Secondary | ICD-10-CM | POA: Diagnosis not present

## 2017-08-20 DIAGNOSIS — R600 Localized edema: Secondary | ICD-10-CM | POA: Diagnosis not present

## 2017-08-20 DIAGNOSIS — M14672 Charcot's joint, left ankle and foot: Secondary | ICD-10-CM | POA: Diagnosis not present

## 2017-08-21 DIAGNOSIS — H3582 Retinal ischemia: Secondary | ICD-10-CM | POA: Diagnosis not present

## 2017-08-21 DIAGNOSIS — H43822 Vitreomacular adhesion, left eye: Secondary | ICD-10-CM | POA: Diagnosis not present

## 2017-08-21 DIAGNOSIS — E103412 Type 1 diabetes mellitus with severe nonproliferative diabetic retinopathy with macular edema, left eye: Secondary | ICD-10-CM | POA: Diagnosis not present

## 2017-08-21 DIAGNOSIS — E103491 Type 1 diabetes mellitus with severe nonproliferative diabetic retinopathy without macular edema, right eye: Secondary | ICD-10-CM | POA: Diagnosis not present

## 2017-09-24 DIAGNOSIS — E109 Type 1 diabetes mellitus without complications: Secondary | ICD-10-CM | POA: Diagnosis not present

## 2017-09-24 DIAGNOSIS — I10 Essential (primary) hypertension: Secondary | ICD-10-CM | POA: Diagnosis not present

## 2017-10-03 DIAGNOSIS — M25572 Pain in left ankle and joints of left foot: Secondary | ICD-10-CM | POA: Diagnosis not present

## 2017-10-03 DIAGNOSIS — M14672 Charcot's joint, left ankle and foot: Secondary | ICD-10-CM | POA: Diagnosis not present

## 2017-10-16 DIAGNOSIS — R748 Abnormal levels of other serum enzymes: Secondary | ICD-10-CM | POA: Diagnosis not present

## 2017-10-29 DIAGNOSIS — D649 Anemia, unspecified: Secondary | ICD-10-CM | POA: Diagnosis not present

## 2017-10-29 DIAGNOSIS — N183 Chronic kidney disease, stage 3 (moderate): Secondary | ICD-10-CM | POA: Diagnosis not present

## 2017-10-29 DIAGNOSIS — I129 Hypertensive chronic kidney disease with stage 1 through stage 4 chronic kidney disease, or unspecified chronic kidney disease: Secondary | ICD-10-CM | POA: Diagnosis not present

## 2017-10-31 DIAGNOSIS — H3582 Retinal ischemia: Secondary | ICD-10-CM | POA: Diagnosis not present

## 2017-10-31 DIAGNOSIS — H43813 Vitreous degeneration, bilateral: Secondary | ICD-10-CM | POA: Diagnosis not present

## 2017-10-31 DIAGNOSIS — E103413 Type 1 diabetes mellitus with severe nonproliferative diabetic retinopathy with macular edema, bilateral: Secondary | ICD-10-CM | POA: Diagnosis not present

## 2017-10-31 DIAGNOSIS — H43822 Vitreomacular adhesion, left eye: Secondary | ICD-10-CM | POA: Diagnosis not present

## 2017-11-19 DIAGNOSIS — E109 Type 1 diabetes mellitus without complications: Secondary | ICD-10-CM | POA: Diagnosis not present

## 2017-11-22 DIAGNOSIS — R7989 Other specified abnormal findings of blood chemistry: Secondary | ICD-10-CM | POA: Diagnosis not present

## 2017-11-23 ENCOUNTER — Other Ambulatory Visit (HOSPITAL_COMMUNITY): Payer: Self-pay | Admitting: Gastroenterology

## 2017-11-23 DIAGNOSIS — R748 Abnormal levels of other serum enzymes: Secondary | ICD-10-CM

## 2017-12-10 ENCOUNTER — Other Ambulatory Visit: Payer: Self-pay | Admitting: Student

## 2017-12-11 ENCOUNTER — Other Ambulatory Visit: Payer: Self-pay | Admitting: Radiology

## 2017-12-11 NOTE — Progress Notes (Signed)
Returned call to pt regarding insulin pump instructions for biopsy tomorrow. Pt states that her pump will bolus her if needed according to sensor calculated blood sugar and, if necessary, she will remove the insulin pump. PA aware of pt needs tomorrow. Pt verbalized all understanding of instructions.

## 2017-12-12 ENCOUNTER — Encounter (HOSPITAL_COMMUNITY): Payer: Self-pay

## 2017-12-12 ENCOUNTER — Ambulatory Visit (HOSPITAL_COMMUNITY)
Admission: RE | Admit: 2017-12-12 | Discharge: 2017-12-12 | Disposition: A | Discharge status: Home / Self Care | Payer: 59 | Source: Ambulatory Visit | Attending: Gastroenterology | Admitting: Gastroenterology

## 2017-12-12 DIAGNOSIS — Z79899 Other long term (current) drug therapy: Secondary | ICD-10-CM | POA: Insufficient documentation

## 2017-12-12 DIAGNOSIS — R748 Abnormal levels of other serum enzymes: Secondary | ICD-10-CM | POA: Diagnosis not present

## 2017-12-12 DIAGNOSIS — Z79891 Long term (current) use of opiate analgesic: Secondary | ICD-10-CM | POA: Insufficient documentation

## 2017-12-12 DIAGNOSIS — K759 Inflammatory liver disease, unspecified: Secondary | ICD-10-CM | POA: Diagnosis not present

## 2017-12-12 DIAGNOSIS — Z8249 Family history of ischemic heart disease and other diseases of the circulatory system: Secondary | ICD-10-CM | POA: Diagnosis not present

## 2017-12-12 DIAGNOSIS — Z87891 Personal history of nicotine dependence: Secondary | ICD-10-CM | POA: Diagnosis not present

## 2017-12-12 DIAGNOSIS — Z9889 Other specified postprocedural states: Secondary | ICD-10-CM | POA: Diagnosis not present

## 2017-12-12 DIAGNOSIS — I872 Venous insufficiency (chronic) (peripheral): Secondary | ICD-10-CM | POA: Diagnosis not present

## 2017-12-12 DIAGNOSIS — J45909 Unspecified asthma, uncomplicated: Secondary | ICD-10-CM | POA: Diagnosis not present

## 2017-12-12 DIAGNOSIS — Z7982 Long term (current) use of aspirin: Secondary | ICD-10-CM | POA: Diagnosis not present

## 2017-12-12 DIAGNOSIS — Z794 Long term (current) use of insulin: Secondary | ICD-10-CM | POA: Diagnosis not present

## 2017-12-12 DIAGNOSIS — K7290 Hepatic failure, unspecified without coma: Secondary | ICD-10-CM | POA: Diagnosis not present

## 2017-12-12 DIAGNOSIS — E11311 Type 2 diabetes mellitus with unspecified diabetic retinopathy with macular edema: Secondary | ICD-10-CM | POA: Diagnosis not present

## 2017-12-12 LAB — CBC
HEMATOCRIT: 32.7 % — AB (ref 36.0–46.0)
Hemoglobin: 10.5 g/dL — ABNORMAL LOW (ref 12.0–15.0)
MCH: 27.9 pg (ref 26.0–34.0)
MCHC: 32.1 g/dL (ref 30.0–36.0)
MCV: 87 fL (ref 78.0–100.0)
Platelets: 194 10*3/uL (ref 150–400)
RBC: 3.76 MIL/uL — AB (ref 3.87–5.11)
RDW: 13.5 % (ref 11.5–15.5)
WBC: 4.9 10*3/uL (ref 4.0–10.5)

## 2017-12-12 LAB — PROTIME-INR
INR: 1.06
Prothrombin Time: 13.8 seconds (ref 11.4–15.2)

## 2017-12-12 LAB — APTT: aPTT: 32 seconds (ref 24–36)

## 2017-12-12 LAB — GLUCOSE, CAPILLARY: GLUCOSE-CAPILLARY: 97 mg/dL (ref 65–99)

## 2017-12-12 MED ORDER — GELATIN ABSORBABLE 12-7 MM EX MISC
CUTANEOUS | Status: AC
  Filled 2017-12-12: qty 1

## 2017-12-12 MED ORDER — FENTANYL CITRATE (PF) 100 MCG/2ML IJ SOLN
INTRAMUSCULAR | Status: AC | PRN
  Administered 2017-12-12 (×2): 50 ug via INTRAVENOUS

## 2017-12-12 MED ORDER — FENTANYL CITRATE (PF) 100 MCG/2ML IJ SOLN
INTRAMUSCULAR | Status: AC
  Filled 2017-12-12: qty 4

## 2017-12-12 MED ORDER — SODIUM CHLORIDE 0.9 % IV SOLN: INTRAVENOUS | Status: DC

## 2017-12-12 MED ORDER — LIDOCAINE HCL (PF) 1 % IJ SOLN
INTRAMUSCULAR | Status: AC
  Filled 2017-12-12: qty 30

## 2017-12-12 MED ORDER — MIDAZOLAM HCL 2 MG/2ML IJ SOLN
INTRAMUSCULAR | Status: AC | PRN
  Administered 2017-12-12: 0.5 mg via INTRAVENOUS
  Administered 2017-12-12: 1 mg via INTRAVENOUS

## 2017-12-12 MED ORDER — MIDAZOLAM HCL 2 MG/2ML IJ SOLN
INTRAMUSCULAR | Status: AC
  Filled 2017-12-12: qty 4

## 2017-12-12 NOTE — Sedation Documentation (Signed)
Patient is resting comfortably. 

## 2017-12-12 NOTE — Sedation Documentation (Signed)
Bandaid R flank  

## 2017-12-12 NOTE — H&P (Signed)
Chief Complaint: Abnormal ALP Possible cirrhosis on MRI  Referring Physician(s): [name],Colleen  Supervising Physician: Colleen [name]  Patient Status: [facility] - Out-pt  History of Present Illness: Colleen [name] is a 52 y.o. female who is known to our service.  She has seen Dr. Anselm [name] in consult for venous insufficiency in the past and she has also undergone random renal biopsy by Dr. Anselm [name] as well.  She is here today for a liver biopsy for elevated ALP.  She also has a mildly nodular appearance of the left hepatic lobe with underlying segmental atrophy, possibly reflecting mild cirrhosis.  She is NPO. No blood thinners.  Past Medical History:  Diagnosis Date  . Asthma   . Complication of anesthesia    Psuedocholinerasterase deficiency per pt  . Diabetes mellitus    TYPE II  . Diabetic macular edema (HCC)    Pt gets injections in eyes with Eyelea  . Seasonal allergies     Past Surgical History:  Procedure Laterality Date  . BACK SURGERY  2009  . CATARACT SURGERY     BILATERAL  . CESAREAN SECTION     X2  . LEFT HEART CATH AND CORONARY ANGIOGRAPHY N/A 05/08/2017   Procedure: Left Heart Cath and Coronary Angiography;  Surgeon: Colleen Mormon, MD;  Location: [place] [facility];  Service: Cardiovascular;  Laterality: N/A;  . ORIF ANKLE FRACTURE Left 03/23/2016   Procedure: OPEN REDUCTION INTERNAL FIXATION (ORIF) ANKLE FRACTURE MALLEOLUS  MALUNION WITH TIBIAL AND FIBULAR OSTEOTOMY AND AUTOGRAFT;  Surgeon: Colleen Simmer, MD;  Location: [facility];  Service: Orthopedics;  Laterality: Left;    Allergies: Patient has no known allergies.  Medications: Prior to Admission medications   Medication Sig Start Date End Date Taking? Authorizing Provider  albuterol (PROVENTIL HFA;VENTOLIN HFA) 108 (90 Base) MCG/ACT inhaler Inhale 1-2 puffs into the lungs every 6 (six) hours as needed for wheezing or shortness of breath.   Yes [provider]    Cholecalciferol (VITAMIN D) 2000 units tablet Take 2,000 Units by mouth daily.   Yes [provider]  ferrous sulfate 325 (65 FE) MG tablet Take 325 mg by mouth daily with breakfast.   Yes [provider]  furosemide (LASIX) 40 MG tablet Take 40 mg by mouth daily.  03/14/17  Yes [provider]  insulin lispro (HUMALOG) 100 UNIT/ML injection Inject into the skin See admin instructions. Insulin Pump - Mini Medtronic 670G   Yes [provider]  metoprolol succinate (TOPROL-XL) 50 MG 24 hr tablet Take 50 mg by mouth daily. Take with or immediately following a meal.   Yes [provider]  oxycodone (OXY-IR) 5 MG capsule Take 5 mg by mouth every 6 (six) hours as needed.   Yes [provider]  aspirin EC 81 MG tablet Take 1 tablet (81 mg total) by mouth daily. Patient not taking: Reported on 11/29/2017 03/22/17   Davonna Belling, MD     Family History  Problem Relation Age of Onset  . Hypertension Father   . Hypertension Paternal Aunt   . Hypertension Paternal Grandmother   . Hypertension Paternal Grandfather     Social History   Socioeconomic History  . Marital status: Married    Spouse name: None  . Number of children: None  . Years of education: None  . Highest education level: None  Social Needs  . Financial resource strain: None  . Food insecurity - worry: None  . Food insecurity - inability: None  .  Transportation needs - medical: None  . Transportation needs - non-medical: None  Occupational History  . None  Tobacco Use  . Smoking status: Former Smoker    Last attempt to quit: 01/17/1987    Years since quitting: 30.9  . Smokeless tobacco: Never Used  Substance and Sexual Activity  . Alcohol use: Yes    Comment: occ  . Drug use: No  . Sexual activity: Yes    Comment: PATIENT'S PARTNER WITH VASECTOMY  Other Topics Concern  . None  Social History Narrative  . None    Review of Systems: A 12 point ROS discussed and  pertinent positives are indicated in the HPI above.  All other systems are negative. Review of Systems Vital Signs: BP (!) 191/76   Pulse (!) 53   Temp 97.7 F (36.5 C)   Resp 20   Ht 5\' 3"  (1.6 m)   Wt 172 lb (78 kg)   SpO2 100% Comment: R AIr  BMI 30.47 kg/m   Physical Exam  Constitutional: She is oriented to person, place, and time. She appears well-developed.  HENT:  Head: Normocephalic and atraumatic.  Eyes: EOM are normal.  Neck: Normal range of motion.  Cardiovascular: Normal rate, regular rhythm and normal heart sounds.  Pulmonary/Chest: Effort normal and breath sounds normal. No stridor. No respiratory distress.  Abdominal: Soft. She exhibits no distension. There is no tenderness.  Musculoskeletal: Normal range of motion.  Neurological: She is alert and oriented to person, place, and time.  Skin: Skin is warm and dry.  Psychiatric: She has a normal mood and affect. Her behavior is normal. Judgment and thought content normal.  Vitals reviewed.   Imaging: No results found.  Labs:  CBC: Recent Labs    03/22/17 1255 06/12/17 0620 12/12/17 1158  WBC 7.2 4.9 4.9  HGB 13.3 10.8* 10.5*  HCT 37.5 32.5* 32.7*  PLT 285 224 194    COAGS: Recent Labs    03/22/17 1255 06/12/17 0620 12/12/17 1158  INR 0.97 1.03 1.06  APTT 23* 30 32    BMP: Recent Labs    03/22/17 1255  NA 133*  K 4.3  CL 98*  CO2 25  GLUCOSE 238*  BUN 32*  CALCIUM 8.5*  CREATININE 1.13*  GFRNONAA 55*  GFRAA >60    LIVER FUNCTION TESTS: No results for input(s): BILITOT, AST, ALT, ALKPHOS, PROT, ALBUMIN in the last 8760 hours.  TUMOR MARKERS: No results for input(s): AFPTM, CEA, CA199, CHROMGRNA in the last 8760 hours.  Assessment and Plan:  Abnormal ALP and possible cirrhosis on MRI.  Will proceed with image guided random liver biopsy today by Dr. Anselm [name].  Risks and benefits discussed with the patient including, but not limited to bleeding, infection, damage to adjacent  structures or low yield requiring additional tests.  All of the patient's questions were answered, patient is agreeable to proceed. Consent signed and in chart.  Thank you for this interesting consult.  I greatly enjoyed meeting Colleen [name] and look forward to participating in their care.  A copy of this report was sent to the requesting provider on this date.  Electronically Signed: Murrell Redden, PA-C 12/12/2017, 1:30 PM   I spent a total of  25 Minutes in face to face in clinical consultation, greater than 50% of which was counseling/coordinating care for liver biopsy.

## 2017-12-12 NOTE — Procedures (Signed)
US guided random liver biopsy.  3 cores from right hepatic lobe and gelfoam injected along biopsy tract.  Minimal blood loss and no immediate complication.

## 2017-12-12 NOTE — Discharge Instructions (Signed)
**Note Colleen [name]-identified via Obfuscation** Liver Biopsy, Care After °These instructions give you information on caring for yourself after your procedure. Your doctor may also give you more specific instructions. Call your doctor if you have any problems or questions after your procedure. °Follow these instructions at home: °· Rest at home for 1-2 days or as told by your doctor. °· Have someone stay with you for at least 24 hours. °· Do not do these things in the first 24 hours: °? Drive. °? Use machinery. °? Take care of other people. °? Sign legal documents. °? Take a bath or shower. °· There are many different ways to close and cover a cut (incision). For example, a cut can be closed with stitches, skin glue, or adhesive strips. Follow your doctor's instructions on: °? Taking care of your cut. °? Changing and removing your bandage (dressing). °? Removing whatever was used to close your cut. °· Do not drink alcohol in the first week. °· Do not lift more than 5 pounds or play contact sports for the first 2 weeks. °· Take medicines only as told by your doctor. For 1 week, do not take medicine that has aspirin in it or medicines like ibuprofen. °· Get your test results. °Contact a doctor if: °· A cut bleeds and leaves more than just a small spot of blood. °· A cut is red, puffs up (swells), or hurts more than before. °· Fluid or something else comes from a cut. °· A cut smells bad. °· You have a fever or chills. °Get help right away if: °· You have swelling, bloating, or pain in your belly (abdomen). °· You get dizzy or faint. °· You have a rash. °· You feel sick to your stomach (nauseous) or throw up (vomit). °· You have trouble breathing, feel short of breath, or feel faint. °· Your chest hurts. °· You have problems talking or seeing. °· You have trouble balancing or moving your arms or legs. °This information is not intended to replace advice given to you by your health care provider. Make sure you discuss any questions you have with your health care  provider. °Document Released: 06/20/2008 Document Revised: 02/17/2016 Document Reviewed: 11/07/2013 °Elsevier Interactive Patient Education © 2018 [employer] ° ° ° ° °Moderate Conscious Sedation, Adult, Care After °These instructions provide you with information about caring for yourself after your procedure. Your health care provider may also give you more specific instructions. Your treatment has been planned according to current medical practices, but problems sometimes occur. Call your health care provider if you have any problems or questions after your procedure. °What can I expect after the procedure? °After your procedure, it is common: °· To feel sleepy for several hours. °· To feel clumsy and have poor balance for several hours. °· To have poor judgment for several hours. °· To vomit if you eat too soon. ° °Follow these instructions at home: °For at least 24 hours after the procedure: ° °· Do not: °? Participate in activities where you could fall or become injured. °? Drive. °? Use heavy machinery. °? Drink alcohol. °? Take sleeping pills or medicines that cause drowsiness. °? Make important decisions or sign legal documents. °? Take care of children on your own. °· Rest. °Eating and drinking °· Follow the diet recommended by your health care provider. °· If you vomit: °? Drink water, juice, or soup when you can drink without vomiting. °? Make sure you have little or no nausea before eating solid foods. °General instructions °·  **Note Colleen [name]-identified via Obfuscation** Have a responsible adult stay with you until you are awake and alert. °· Take over-the-counter and prescription medicines only as told by your health care provider. °· If you smoke, do not smoke without supervision. °· Keep all follow-up visits as told by your health care provider. This is important. °Contact a health care provider if: °· You keep feeling nauseous or you keep vomiting. °· You feel light-headed. °· You develop a rash. °· You have a fever. °Get help right away  if: °· You have trouble breathing. °This information is not intended to replace advice given to you by your health care provider. Make sure you discuss any questions you have with your health care provider. °Document Released: 07/02/2013 Document Revised: 02/14/2016 Document Reviewed: 01/01/2016 °Elsevier Interactive Patient Education © 2018 [employer]vier Inc[employer] ° ° °

## 2017-12-26 DIAGNOSIS — H3582 Retinal ischemia: Secondary | ICD-10-CM | POA: Diagnosis not present

## 2017-12-26 DIAGNOSIS — H43822 Vitreomacular adhesion, left eye: Secondary | ICD-10-CM | POA: Diagnosis not present

## 2017-12-26 DIAGNOSIS — E103413 Type 1 diabetes mellitus with severe nonproliferative diabetic retinopathy with macular edema, bilateral: Secondary | ICD-10-CM | POA: Diagnosis not present

## 2017-12-26 DIAGNOSIS — H43813 Vitreous degeneration, bilateral: Secondary | ICD-10-CM | POA: Diagnosis not present

## 2018-01-07 DIAGNOSIS — M14679 Charcot's joint, unspecified ankle and foot: Secondary | ICD-10-CM | POA: Diagnosis not present

## 2018-01-07 DIAGNOSIS — M25572 Pain in left ankle and joints of left foot: Secondary | ICD-10-CM | POA: Diagnosis not present

## 2018-01-22 DIAGNOSIS — R740 Nonspecific elevation of levels of transaminase and lactic acid dehydrogenase [LDH]: Secondary | ICD-10-CM | POA: Diagnosis not present

## 2018-01-22 DIAGNOSIS — R197 Diarrhea, unspecified: Secondary | ICD-10-CM | POA: Diagnosis not present

## 2018-02-05 DIAGNOSIS — R197 Diarrhea, unspecified: Secondary | ICD-10-CM | POA: Diagnosis not present

## 2018-02-05 DIAGNOSIS — K6389 Other specified diseases of intestine: Secondary | ICD-10-CM | POA: Diagnosis not present

## 2018-02-05 DIAGNOSIS — K529 Noninfective gastroenteritis and colitis, unspecified: Secondary | ICD-10-CM | POA: Diagnosis not present

## 2018-02-05 DIAGNOSIS — D126 Benign neoplasm of colon, unspecified: Secondary | ICD-10-CM | POA: Diagnosis not present

## 2018-02-05 DIAGNOSIS — K635 Polyp of colon: Secondary | ICD-10-CM | POA: Diagnosis not present

## 2018-02-15 DIAGNOSIS — E109 Type 1 diabetes mellitus without complications: Secondary | ICD-10-CM | POA: Diagnosis not present

## 2018-02-21 DIAGNOSIS — H43822 Vitreomacular adhesion, left eye: Secondary | ICD-10-CM | POA: Diagnosis not present

## 2018-02-21 DIAGNOSIS — H35372 Puckering of macula, left eye: Secondary | ICD-10-CM | POA: Diagnosis not present

## 2018-02-21 DIAGNOSIS — E103413 Type 1 diabetes mellitus with severe nonproliferative diabetic retinopathy with macular edema, bilateral: Secondary | ICD-10-CM | POA: Diagnosis not present

## 2018-02-21 DIAGNOSIS — H3582 Retinal ischemia: Secondary | ICD-10-CM | POA: Diagnosis not present

## 2018-02-28 DIAGNOSIS — N183 Chronic kidney disease, stage 3 (moderate): Secondary | ICD-10-CM | POA: Diagnosis not present

## 2018-02-28 DIAGNOSIS — D649 Anemia, unspecified: Secondary | ICD-10-CM | POA: Diagnosis not present

## 2018-02-28 DIAGNOSIS — I129 Hypertensive chronic kidney disease with stage 1 through stage 4 chronic kidney disease, or unspecified chronic kidney disease: Secondary | ICD-10-CM | POA: Diagnosis not present

## 2018-03-03 IMAGING — MR MR HEAD W/O CM
9 of 11 series · 32 of 48 positions shown · non-contrast
Comparison: Head CT 03/22/2017

CLINICAL DATA: Speech difficulties.  Vomiting and confusion.

EXAM:
MRI HEAD WITHOUT CONTRAST
MRA HEAD WITHOUT CONTRAST
TECHNIQUE: Multiplanar, multiecho pulse sequences of the brain and surrounding
structures were obtained without intravenous contrast. Angiographic
images of the head were obtained using MRA technique without
contrast.

[Series 3: DWI · axial · 3.0mm · 0.94mm/px · z∈[-43,+86]mm · 6 of 88 slices shown (1 of 2)]
[im 1/88]
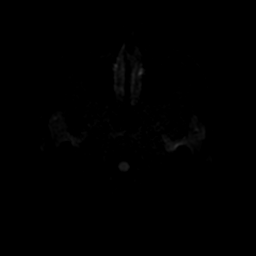
[im 18/88]
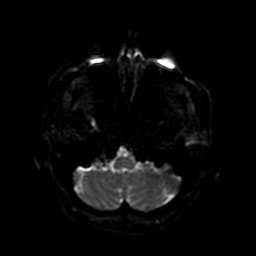
[im 35/88]
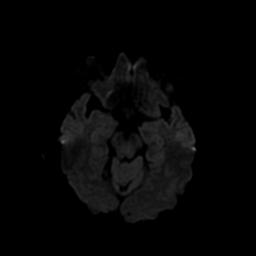
[im 53/88]
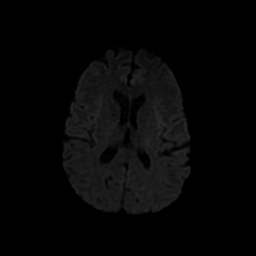
[im 70/88]
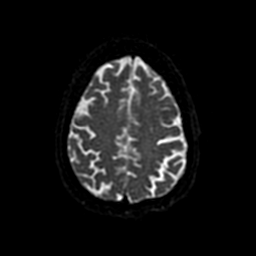
[im 88/88]
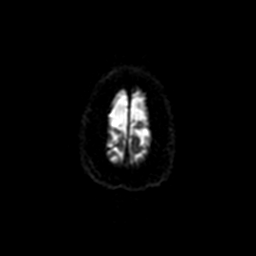

[Series 4: ax (id) 2 · axial · 1.0mm · 0.43mm/px · z∈[-54,+22]mm · 7 of 184 slices shown]
[im 1/184]
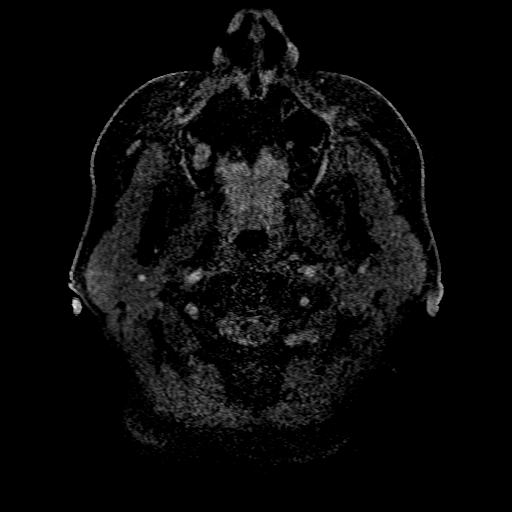
[im 31/184]
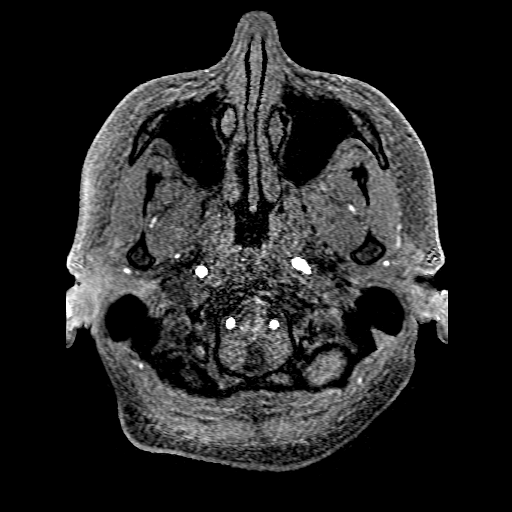
[im 62/184]
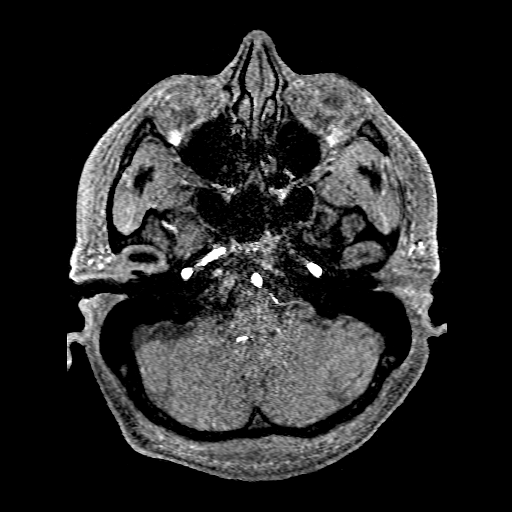
[im 77/184]
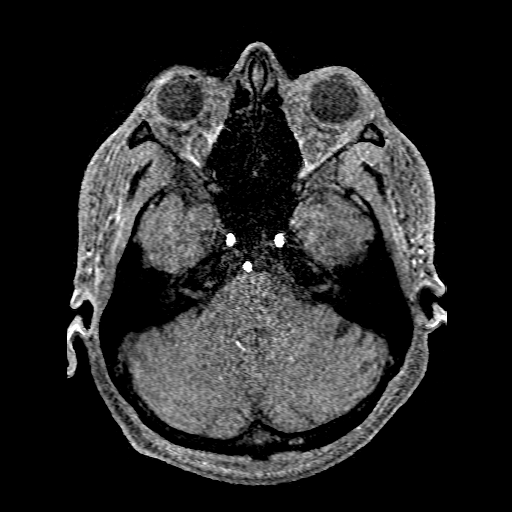
[im 107/184]
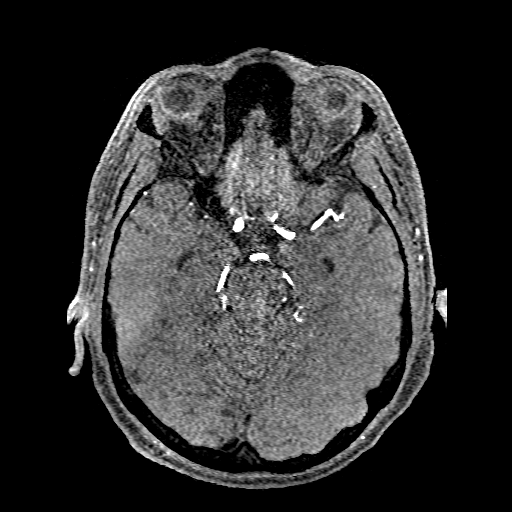
[im 123/184]
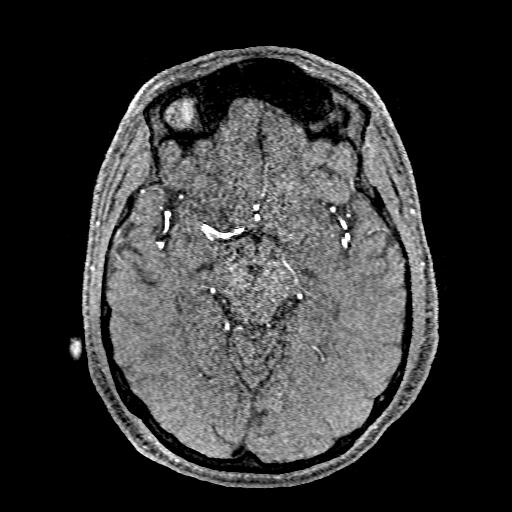
[im 153/184]
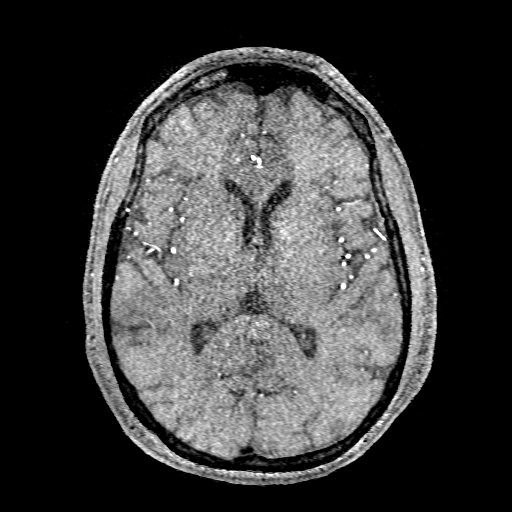

[Series 5: DWI · coronal · 4.0mm · 0.94mm/px · 5 of 68 slices shown (2 of 2)]
[im 1/68]
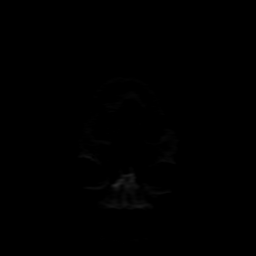
[im 17/68]
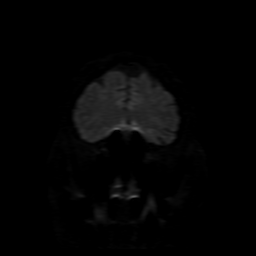
[im 34/68]
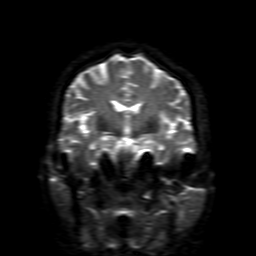
[im 51/68]
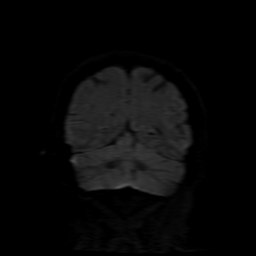
[im 68/68]
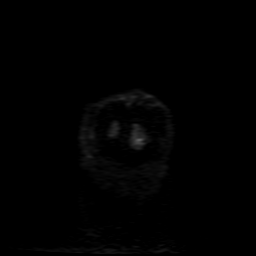

[Series 6: T2 · axial · 5.0mm · 0.47mm/px · z∈[-42,+84]mm · 2 of 22 slices shown (1 of 2)]
[im 1/22]
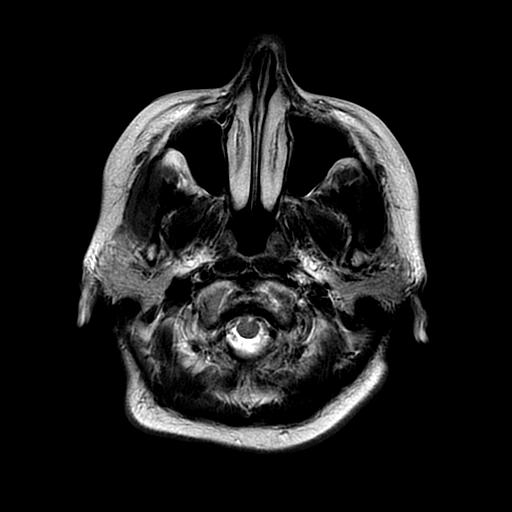
[im 22/22]
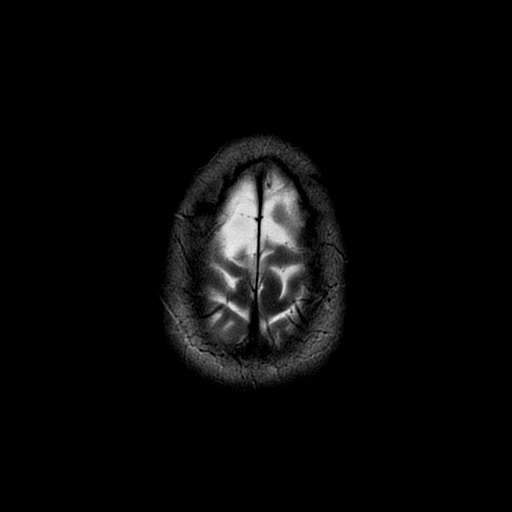

[Series 7: FLAIR · axial · 5.0mm · 0.94mm/px · z∈[-42,+84]mm · 2 of 22 slices shown (1 of 2)]
[im 1/22]
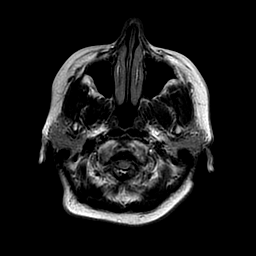
[im 22/22]
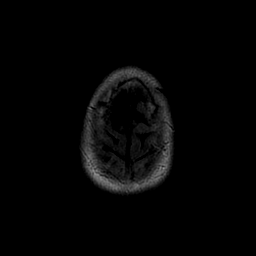

[Series 9: FLAIR · sagittal · 5.0mm · 0.47mm/px · 2 of 23 slices shown (2 of 2)]
[im 1/23]
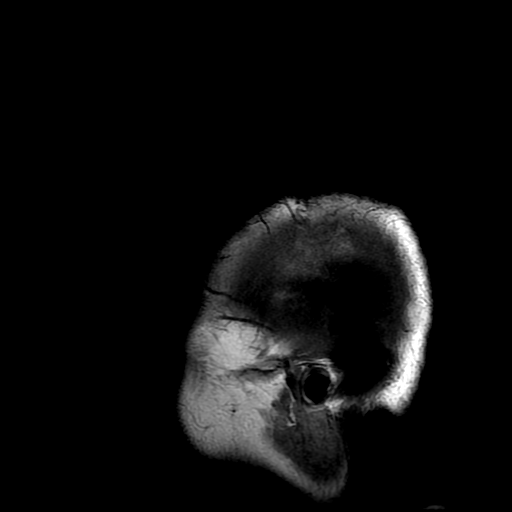
[im 23/23]
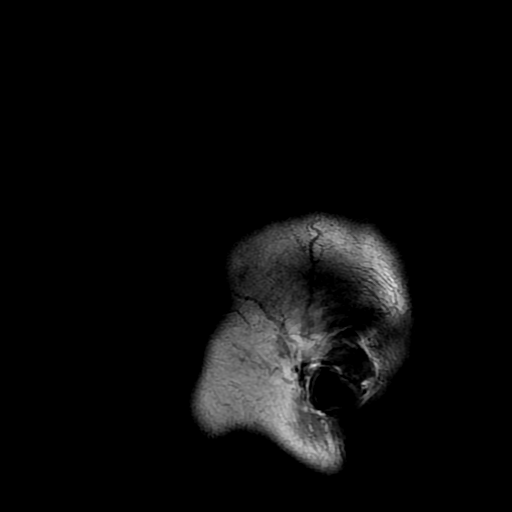

[Series 11: T2 · coronal · 5.0mm · 0.43mm/px · 2 of 28 slices shown (2 of 2)]
[im 1/28]
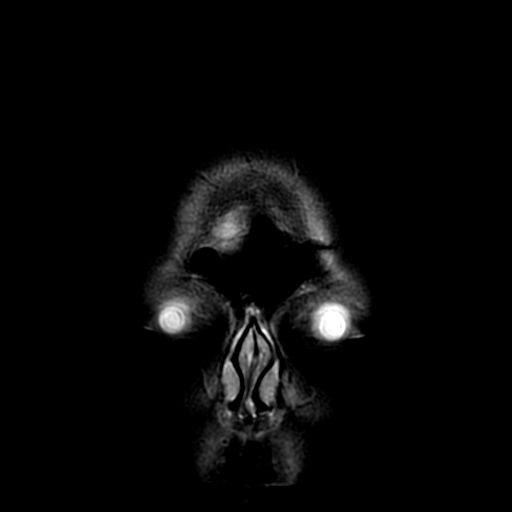
[im 28/28]
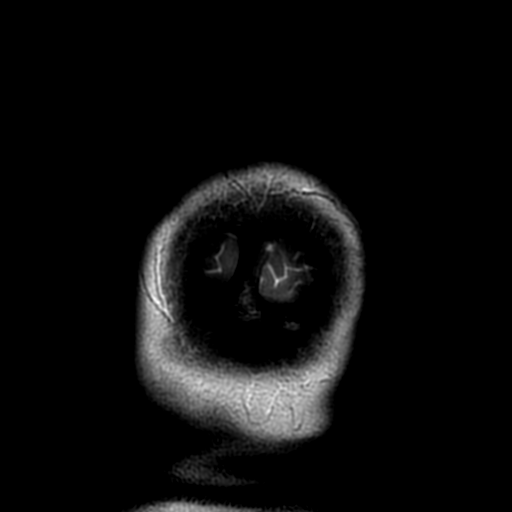

[Series 350: ADC · axial · 3.0mm · 0.94mm/px · z∈[-43,+86]mm · 3 of 43 slices shown (1 of 2)]
[im 1/43]
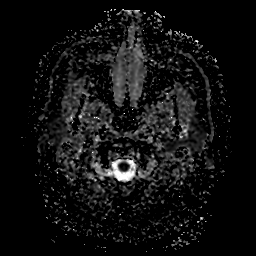
[im 22/43]
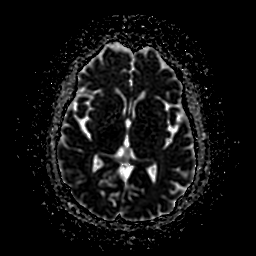
[im 43/43]
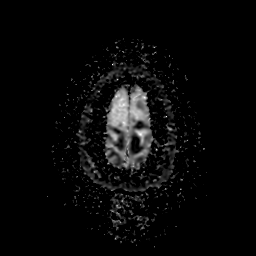

[Series 550: ADC · coronal · 4.0mm · 0.94mm/px · 3 of 34 slices shown (2 of 2)]
[im 1/34]
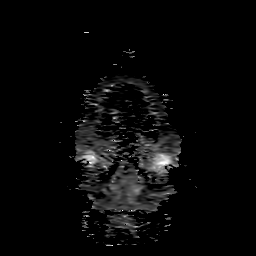
[im 17/34]
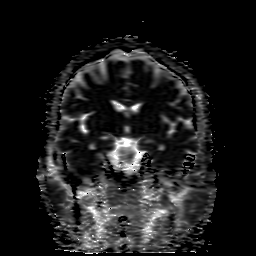
[im 34/34]
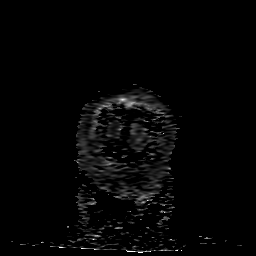

[32 of 48 positions shown; findings below may reference images not displayed]

FINDINGS: MRI HEAD FINDINGS

Brain: The midline structures are normal. No focal diffusion
restriction to indicate acute infarct. No intraparenchymal
hemorrhage. The brain parenchymal signal is normal. No mass lesion.
No chronic microhemorrhage or cerebral amyloid angiopathy. No
hydrocephalus, age advanced atrophy or lobar predominant volume
loss. No dural abnormality or extra-axial collection.

Skull and upper cervical spine: The visualized skull base,
calvarium, upper cervical spine and extracranial soft tissues are
normal.

Sinuses/Orbits: No fluid levels or advanced mucosal thickening. No
mastoid effusion. Normal orbits.

MRA HEAD FINDINGS

Intracranial internal carotid arteries: Normal.

Anterior cerebral arteries: Normal.

Middle cerebral arteries: Normal.

Posterior communicating arteries: Absent bilaterally.

Posterior cerebral arteries: Normal.

Basilar artery: Normal.

Vertebral arteries: Codominant. Normal.

Superior cerebellar arteries: Normal.

Anterior inferior cerebellar arteries: Normal.

Posterior inferior cerebellar arteries: Normal on the left.
IMPRESSION: Normal MRI and MRA of the brain.

## 2018-03-05 DIAGNOSIS — Z13220 Encounter for screening for lipoid disorders: Secondary | ICD-10-CM | POA: Diagnosis not present

## 2018-03-05 DIAGNOSIS — D508 Other iron deficiency anemias: Secondary | ICD-10-CM | POA: Diagnosis not present

## 2018-03-05 DIAGNOSIS — Z0000 Encounter for general adult medical examination without abnormal findings: Secondary | ICD-10-CM | POA: Diagnosis not present

## 2018-03-05 DIAGNOSIS — Z124 Encounter for screening for malignant neoplasm of cervix: Secondary | ICD-10-CM | POA: Diagnosis not present

## 2018-04-10 DIAGNOSIS — M25472 Effusion, left ankle: Secondary | ICD-10-CM | POA: Diagnosis not present

## 2018-04-10 DIAGNOSIS — M14672 Charcot's joint, left ankle and foot: Secondary | ICD-10-CM | POA: Diagnosis not present

## 2018-04-17 DIAGNOSIS — H3582 Retinal ischemia: Secondary | ICD-10-CM | POA: Diagnosis not present

## 2018-04-17 DIAGNOSIS — H43813 Vitreous degeneration, bilateral: Secondary | ICD-10-CM | POA: Diagnosis not present

## 2018-04-17 DIAGNOSIS — E103493 Type 1 diabetes mellitus with severe nonproliferative diabetic retinopathy without macular edema, bilateral: Secondary | ICD-10-CM | POA: Diagnosis not present

## 2018-04-17 DIAGNOSIS — H35372 Puckering of macula, left eye: Secondary | ICD-10-CM | POA: Diagnosis not present

## 2018-04-30 DIAGNOSIS — R809 Proteinuria, unspecified: Secondary | ICD-10-CM | POA: Diagnosis not present

## 2018-04-30 DIAGNOSIS — E109 Type 1 diabetes mellitus without complications: Secondary | ICD-10-CM | POA: Diagnosis not present

## 2018-05-17 IMAGING — US US EXTREM LOW VENOUS*L*
1 series · 13 of 24 positions shown · non-contrast
Comparison: None.

CLINICAL DATA: 50-year-old diabetic with history of a left ankle
fracture and internal fixation. Patient has significant lower
extremity swelling, left side greater than right. Previous pelvic CT
excluded Loka disease. Evaluation for lower extremity venous
insufficiency.

EXAM:
BILATERAL LOWER EXTREMITY VENOUS DUPLEX ULTRASOUND
TECHNIQUE: Gray-scale sonography with graded compression, as well as color
Doppler and duplex ultrasound, were performed to evaluate the deep
and superficial veins of both lower extremities. Spectral Doppler
was utilized to evaluate flow at rest and with distal augmentation
maneuvers. A complete superficial venous insufficiency exam was
performed in the upright standing position. I personally performed
the technical portion of the exam.

[Series 1: us extrem low venous*left* · 0.07mm/px · 13 of 58 slices shown]
[im 1/58]
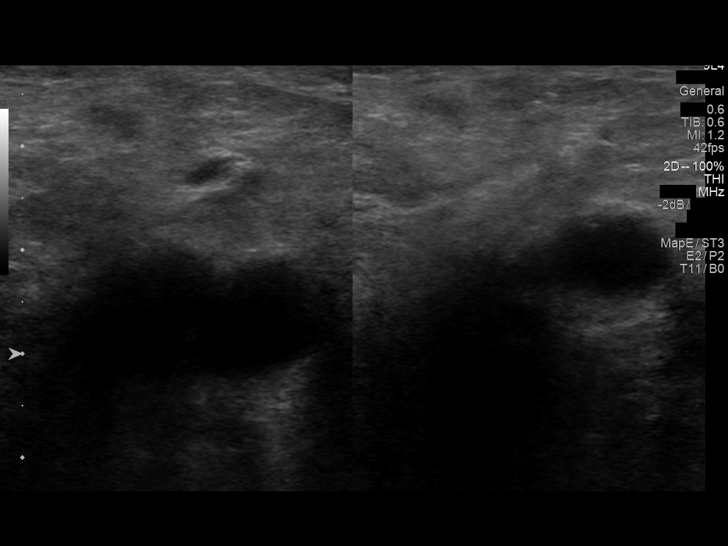
[im 5/58]
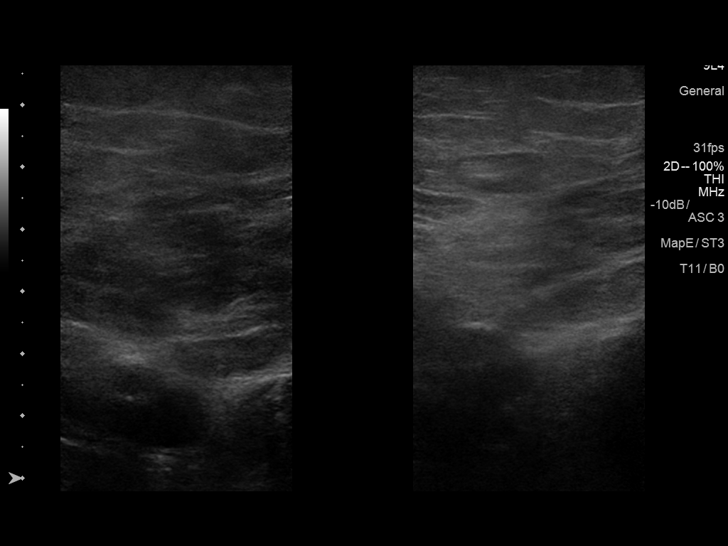
[im 10/58]
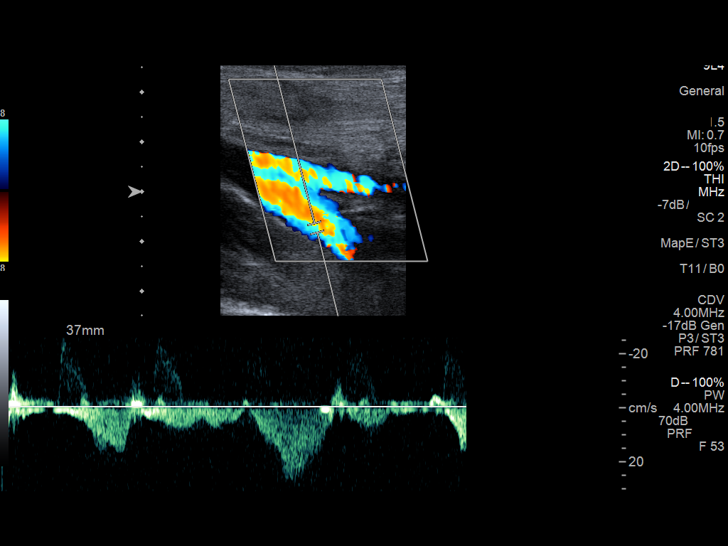
[im 15/58]
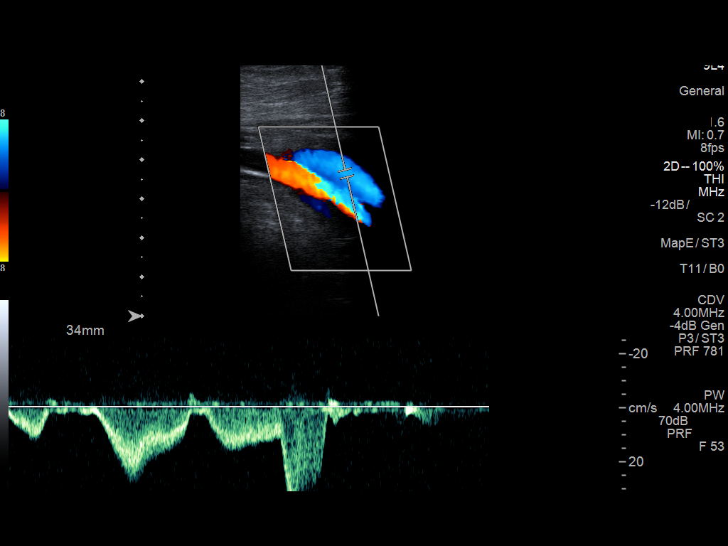
[im 20/58]
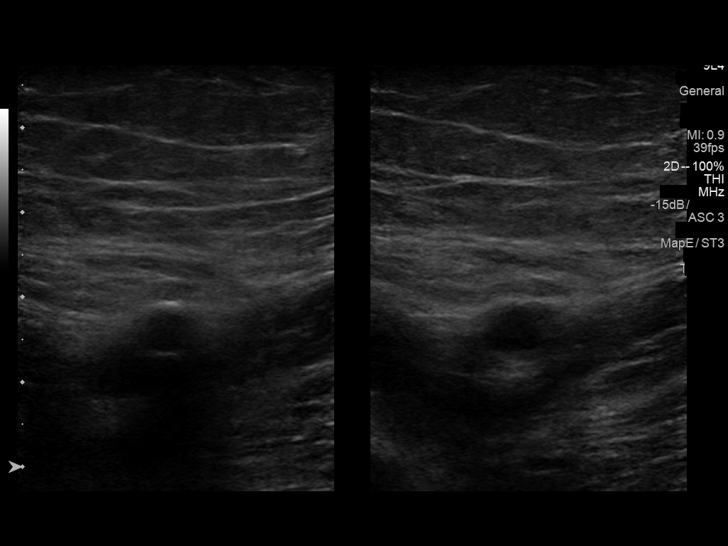
[im 25/58]
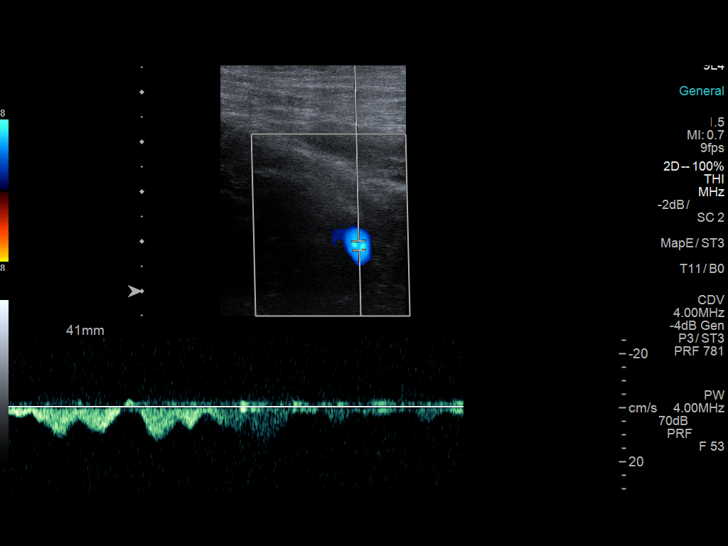
[im 30/58]
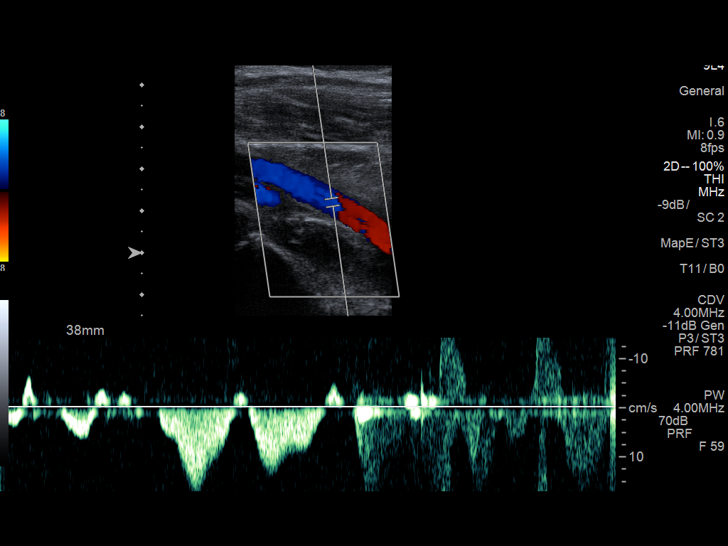
[im 33/58]
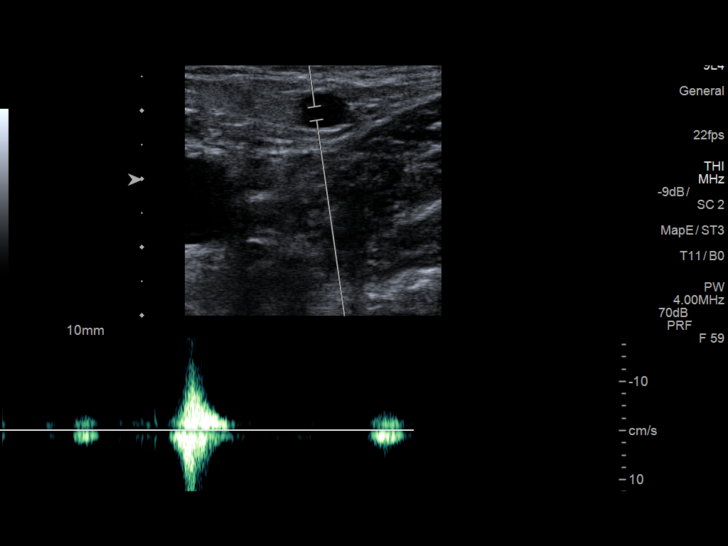
[im 38/58]
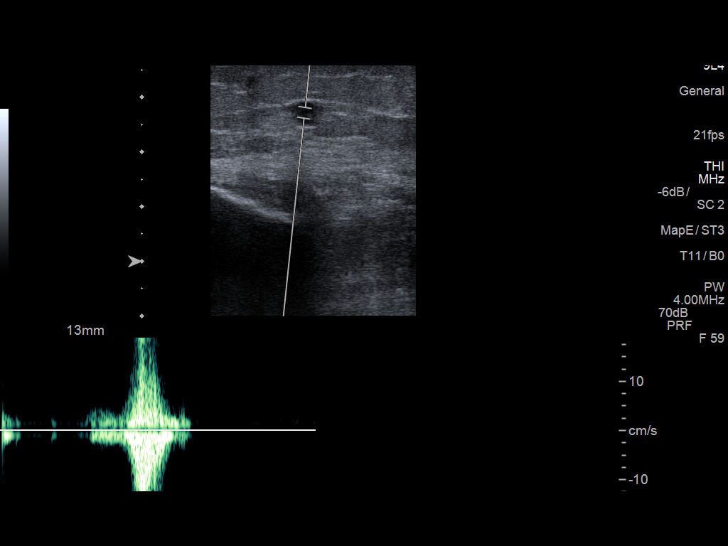
[im 43/58]
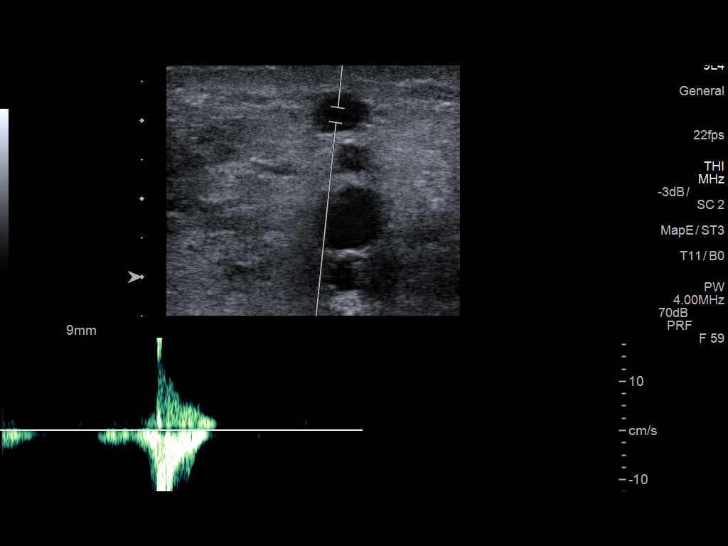
[im 48/58]
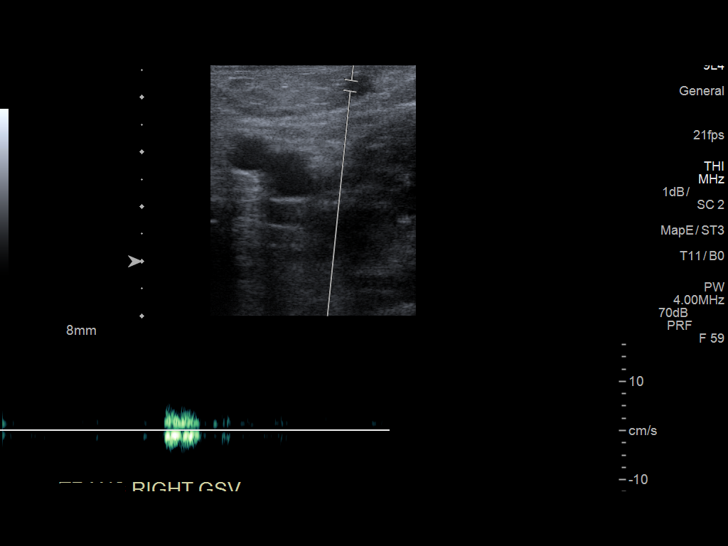
[im 53/58]
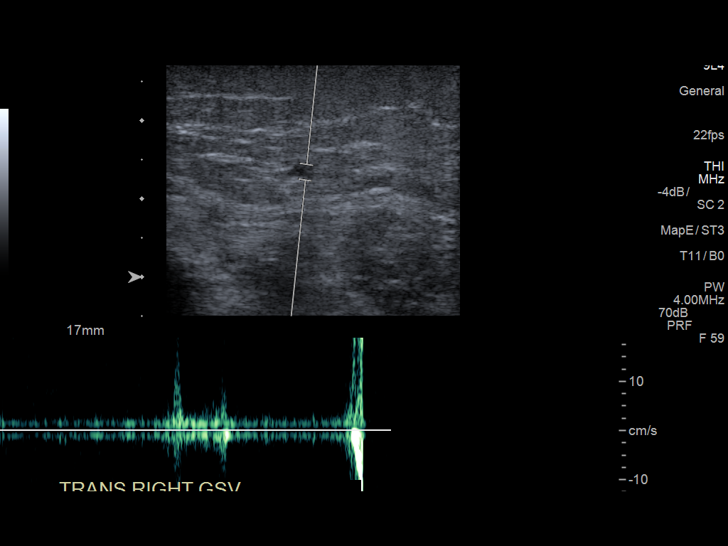
[im 58/58]
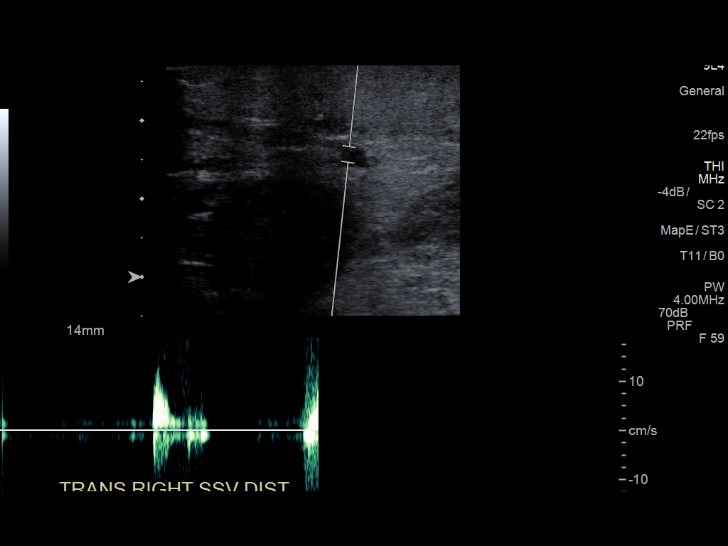

[13 of 24 positions shown; findings below may reference images not displayed]

FINDINGS: Right lower extremity: Deep venous system: Normal compressibility,
augmentation and color Doppler flow in the right common femoral
vein, right femoral vein and right popliteal vein without thrombus.
The right saphenofemoral junction is patent. Right profunda femoral
vein is patent without thrombus.

Superficial venous system: No reflux in the right great saphenous
vein or right short saphenous vein. No varicosities.

Left lower extremity: Deep venous system: Normal compressibility,
augmentation and color Doppler flow in the left common femoral vein,
left femoral vein and left popliteal vein without thrombus. The left
saphenofemoral junction is patent. Left profunda femoral vein is
patent without thrombus.

Superficial venous system: No reflux identified in the left great
saphenous vein or the left short saphenous vein. Few prominent veins
in the left knee and calf region but no reflux identified in these
veins. Subcutaneous edema in the left lower leg and ankle region.
Prominent lymph node in the proximal left thigh.
IMPRESSION: Negative lower extremity venous duplex examination. Negative for DVT
or venous insufficiency in both legs.

## 2018-05-24 DIAGNOSIS — M7542 Impingement syndrome of left shoulder: Secondary | ICD-10-CM | POA: Diagnosis not present

## 2018-05-24 DIAGNOSIS — M25512 Pain in left shoulder: Secondary | ICD-10-CM | POA: Diagnosis not present

## 2018-05-24 DIAGNOSIS — M7502 Adhesive capsulitis of left shoulder: Secondary | ICD-10-CM | POA: Diagnosis not present

## 2018-05-26 DIAGNOSIS — E109 Type 1 diabetes mellitus without complications: Secondary | ICD-10-CM | POA: Diagnosis not present

## 2018-05-28 DIAGNOSIS — M19072 Primary osteoarthritis, left ankle and foot: Secondary | ICD-10-CM | POA: Diagnosis not present

## 2018-05-28 DIAGNOSIS — M25572 Pain in left ankle and joints of left foot: Secondary | ICD-10-CM | POA: Diagnosis not present

## 2018-05-28 DIAGNOSIS — S9302XA Subluxation of left ankle joint, initial encounter: Secondary | ICD-10-CM | POA: Diagnosis not present

## 2018-05-28 DIAGNOSIS — M7989 Other specified soft tissue disorders: Secondary | ICD-10-CM | POA: Diagnosis not present

## 2018-05-28 DIAGNOSIS — M8580 Other specified disorders of bone density and structure, unspecified site: Secondary | ICD-10-CM | POA: Diagnosis not present

## 2018-05-28 DIAGNOSIS — M14672 Charcot's joint, left ankle and foot: Secondary | ICD-10-CM | POA: Diagnosis not present

## 2018-05-28 DIAGNOSIS — M79672 Pain in left foot: Secondary | ICD-10-CM | POA: Diagnosis not present

## 2018-06-14 DIAGNOSIS — M25512 Pain in left shoulder: Secondary | ICD-10-CM | POA: Diagnosis not present

## 2018-06-18 DIAGNOSIS — M75102 Unspecified rotator cuff tear or rupture of left shoulder, not specified as traumatic: Secondary | ICD-10-CM | POA: Diagnosis not present

## 2018-06-18 DIAGNOSIS — M25512 Pain in left shoulder: Secondary | ICD-10-CM | POA: Diagnosis not present

## 2018-06-18 DIAGNOSIS — M14679 Charcot's joint, unspecified ankle and foot: Secondary | ICD-10-CM | POA: Diagnosis not present

## 2018-06-19 DIAGNOSIS — H35372 Puckering of macula, left eye: Secondary | ICD-10-CM | POA: Diagnosis not present

## 2018-06-19 DIAGNOSIS — E103413 Type 1 diabetes mellitus with severe nonproliferative diabetic retinopathy with macular edema, bilateral: Secondary | ICD-10-CM | POA: Diagnosis not present

## 2018-06-19 DIAGNOSIS — H43822 Vitreomacular adhesion, left eye: Secondary | ICD-10-CM | POA: Diagnosis not present

## 2018-06-19 DIAGNOSIS — H3582 Retinal ischemia: Secondary | ICD-10-CM | POA: Diagnosis not present

## 2018-06-25 DIAGNOSIS — H60392 Other infective otitis externa, left ear: Secondary | ICD-10-CM | POA: Diagnosis not present

## 2018-08-21 DIAGNOSIS — I2510 Atherosclerotic heart disease of native coronary artery without angina pectoris: Secondary | ICD-10-CM | POA: Diagnosis not present

## 2018-08-21 DIAGNOSIS — E1122 Type 2 diabetes mellitus with diabetic chronic kidney disease: Secondary | ICD-10-CM | POA: Diagnosis not present

## 2018-08-21 DIAGNOSIS — I10 Essential (primary) hypertension: Secondary | ICD-10-CM | POA: Diagnosis not present

## 2018-09-04 DIAGNOSIS — H35372 Puckering of macula, left eye: Secondary | ICD-10-CM | POA: Diagnosis not present

## 2018-09-04 DIAGNOSIS — H3582 Retinal ischemia: Secondary | ICD-10-CM | POA: Diagnosis not present

## 2018-09-04 DIAGNOSIS — E103511 Type 1 diabetes mellitus with proliferative diabetic retinopathy with macular edema, right eye: Secondary | ICD-10-CM | POA: Diagnosis not present

## 2018-09-04 DIAGNOSIS — E103412 Type 1 diabetes mellitus with severe nonproliferative diabetic retinopathy with macular edema, left eye: Secondary | ICD-10-CM | POA: Diagnosis not present

## 2018-09-10 DIAGNOSIS — E109 Type 1 diabetes mellitus without complications: Secondary | ICD-10-CM | POA: Diagnosis not present

## 2018-09-12 DIAGNOSIS — N2581 Secondary hyperparathyroidism of renal origin: Secondary | ICD-10-CM | POA: Diagnosis not present

## 2018-09-12 DIAGNOSIS — N183 Chronic kidney disease, stage 3 (moderate): Secondary | ICD-10-CM | POA: Diagnosis not present

## 2018-09-12 DIAGNOSIS — I129 Hypertensive chronic kidney disease with stage 1 through stage 4 chronic kidney disease, or unspecified chronic kidney disease: Secondary | ICD-10-CM | POA: Diagnosis not present

## 2018-09-12 DIAGNOSIS — D649 Anemia, unspecified: Secondary | ICD-10-CM | POA: Diagnosis not present

## 2018-09-23 DIAGNOSIS — M21961 Unspecified acquired deformity of right lower leg: Secondary | ICD-10-CM | POA: Diagnosis not present

## 2018-09-23 DIAGNOSIS — E1143 Type 2 diabetes mellitus with diabetic autonomic (poly)neuropathy: Secondary | ICD-10-CM | POA: Diagnosis not present

## 2018-09-23 DIAGNOSIS — M14672 Charcot's joint, left ankle and foot: Secondary | ICD-10-CM | POA: Diagnosis not present

## 2018-10-29 DIAGNOSIS — G609 Hereditary and idiopathic neuropathy, unspecified: Secondary | ICD-10-CM | POA: Diagnosis not present

## 2018-10-29 DIAGNOSIS — R809 Proteinuria, unspecified: Secondary | ICD-10-CM | POA: Diagnosis not present

## 2018-10-29 DIAGNOSIS — E109 Type 1 diabetes mellitus without complications: Secondary | ICD-10-CM | POA: Diagnosis not present

## 2019-02-18 ENCOUNTER — Encounter: Payer: Self-pay | Admitting: Cardiology

## 2019-02-18 NOTE — Progress Notes (Signed)
Pre op letter for left shoulder surgery sent

## 2019-08-18 ENCOUNTER — Ambulatory Visit (INDEPENDENT_AMBULATORY_CARE_PROVIDER_SITE_OTHER): Payer: 59 | Admitting: Cardiology

## 2019-08-18 ENCOUNTER — Encounter: Payer: Self-pay | Admitting: Cardiology

## 2019-08-18 ENCOUNTER — Other Ambulatory Visit: Payer: Self-pay

## 2019-08-18 VITALS — BP 120/72 | HR 67 | Temp 98.1°F | Ht 63.0 in | Wt 185.7 lb

## 2019-08-18 DIAGNOSIS — I2510 Atherosclerotic heart disease of native coronary artery without angina pectoris: Secondary | ICD-10-CM

## 2019-08-18 DIAGNOSIS — I129 Hypertensive chronic kidney disease with stage 1 through stage 4 chronic kidney disease, or unspecified chronic kidney disease: Secondary | ICD-10-CM | POA: Diagnosis not present

## 2019-08-18 DIAGNOSIS — N183 Chronic kidney disease, stage 3 unspecified: Secondary | ICD-10-CM

## 2019-08-18 DIAGNOSIS — I951 Orthostatic hypotension: Secondary | ICD-10-CM

## 2019-08-18 DIAGNOSIS — I10 Essential (primary) hypertension: Secondary | ICD-10-CM

## 2019-08-18 DIAGNOSIS — E1122 Type 2 diabetes mellitus with diabetic chronic kidney disease: Secondary | ICD-10-CM

## 2019-08-18 DIAGNOSIS — Z794 Long term (current) use of insulin: Secondary | ICD-10-CM

## 2019-08-18 MED ORDER — ATORVASTATIN CALCIUM 10 MG PO TABS: 10.0000 mg | ORAL_TABLET | Freq: Every day | ORAL | 0 refills | Status: DC

## 2019-08-18 NOTE — Progress Notes (Signed)
Primary Physician/Referring:  Orpah Melter, MD  Patient ID: Colleen [name], female    DOB: 10/10/1965, 53 y.o.   MRN: [id number]  Chief Complaint  Patient presents with  . Coronary Artery Disease  . 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 Hollie Salk and also peripheral neuropathy, hypertension, CAD by coronary angiography in 2018 revealing proximal LAD 40% stenosis, D1 80% stenosis and D2 60% stenosis, on medical therapy presents here for annual visit.  Left shoulder surgery in July 2020, and right knee meniscectomy in Sept 2020.  No angina, states hypertension has been well controlled. She has chronic shortness of breath, denies any palpitations or chest pain. Denies symptoms of claudication.  Past Medical History:  Diagnosis Date  . Asthma   . Complication of anesthesia    Psuedocholinerasterase deficiency per pt  . Diabetes mellitus    TYPE II  . Diabetic macular edema (HCC)    Pt gets injections in eyes with Eyelea  . Seasonal allergies    Past Surgical History:  Procedure Laterality Date  . BACK SURGERY  2009  . CATARACT SURGERY     BILATERAL  . CESAREAN SECTION     X2  . KNEE SURGERY Right   . LEFT HEART CATH AND CORONARY ANGIOGRAPHY N/A 05/08/2017   Procedure: Left Heart Cath and Coronary Angiography;  Surgeon: Nigel Mormon, MD;  Location: [facility];  Service: Cardiovascular;  Laterality: N/A;  . ORIF ANKLE FRACTURE Left 03/23/2016   Procedure: OPEN REDUCTION INTERNAL FIXATION (ORIF) ANKLE FRACTURE MALLEOLUS  MALUNION WITH TIBIAL AND FIBULAR OSTEOTOMY AND AUTOGRAFT;  Surgeon: Wylene Simmer, MD;  Location: [facility];  Service: Orthopedics;  Laterality: Left;  . SHOULDER ARTHROSCOPY W/ ROTATOR CUFF REPAIR Left     Social History   Socioeconomic History  . Marital status: Married    Spouse name: Not on file  . Number of children: 2  . Years of education: Not on file  . Highest education level: Not on file  Occupational History  . Not on file  Social Needs  . Financial resource strain: Not on file  . Food insecurity    Worry: Not on file    Inability: Not on file  . Transportation needs    Medical: Not on file    Non-medical: Not on file  Tobacco Use  . Smoking status: Former Smoker    Packs/day: 0.25    Years: 2.00    Pack years: 0.50    Types: Cigarettes    Quit date: 01/17/1987    Years since quitting: 32.6  . Smokeless tobacco: Never Used  Substance and Sexual Activity  . Alcohol use: Yes    Comment: occ  . Drug use: No  . Sexual activity: Yes    Comment: PATIENT'S PARTNER WITH VASECTOMY  Lifestyle  . Physical activity    Days per week: Not on file    Minutes per session: Not on file  . Stress: Not on file  Relationships  . Social Herbalist on phone: Not on file    Gets together: Not on file    Attends religious service: Not on file    Active member of club or organization: Not on file  Attends meetings of clubs or organizations: Not on file    Relationship status: Not on file  . Intimate partner violence    Fear of current or ex partner: Not on file    Emotionally abused: Not on file    Physically abused: Not on file    Forced sexual activity: Not on file  Other Topics Concern  . Not on file  Social History Narrative  . Not on file   ROS  Review of Systems  Constitution: Negative for decreased appetite, malaise/fatigue, weight gain and weight loss.  Eyes: Negative for visual disturbance.  Cardiovascular: Positive for dyspnea on exertion (mild) and leg swelling (takes PRN lasix). Negative for chest pain, claudication, orthopnea, palpitations and syncope.  Respiratory: Negative for hemoptysis and wheezing.   Endocrine: Negative for cold intolerance  and heat intolerance.  Hematologic/Lymphatic: Does not bruise/bleed easily.  Skin: Negative for nail changes.  Musculoskeletal: Positive for back pain. Negative for muscle weakness and myalgias.  Gastrointestinal: Negative for abdominal pain, change in bowel habit, nausea and vomiting.  Neurological: Negative for difficulty with concentration, dizziness, focal weakness and headaches.  Psychiatric/Behavioral: Negative for altered mental status and suicidal ideas.  All other systems reviewed and are negative.  Objective  Blood pressure 120/72, pulse 67, temperature 98.1 F (36.7 C), height 5\' 3"  (1.6 m), weight 185 lb 11.2 oz (84.2 kg), SpO2 99 %. Body mass index is 32.9 kg/m.   Physical Exam  Constitutional: She is oriented to person, place, and time. Vital signs are normal. She appears well-developed and well-nourished.  Mildly obese  HENT:  Head: Normocephalic and atraumatic.  Neck: Normal range of motion.  Cardiovascular: Normal rate, regular rhythm, normal heart sounds and intact distal pulses.  Pulses:      Femoral pulses are 2+ on the right side and 2+ on the left side.      Popliteal pulses are 2+ on the right side and 2+ on the left side.       Dorsalis pedis pulses are 2+ on the right side and 2+ on the left side.       Posterior tibial pulses are 2+ on the right side and 2+ on the left side.  Trace edema  Pulmonary/Chest: Effort normal and breath sounds normal. No accessory muscle usage. No respiratory distress.  Abdominal: Soft. Bowel sounds are normal.  Musculoskeletal: Normal range of motion.  Neurological: She is alert and oriented to person, place, and time.  Skin: Skin is warm and dry.  Vitals reviewed.  Radiology: No results found.  Laboratory examination:   Lipid profile 04/18/2017: Total cholesterol 194, triglycerides 43, HDL 104, LDL 81.  No results for input(s): NA, K, CL, CO2, GLUCOSE, BUN, CREATININE, CALCIUM, GFRNONAA, GFRAA in the last 8760 hours. CMP  Latest Ref Rng & Units 03/22/2017 03/21/2016 02/25/2008  Glucose 65 - 99 mg/dL 238(H) 106(H) 242(H)  BUN 6 - 20 mg/dL 32(H) 25(H) 16  Creatinine 0.44 - 1.00 mg/dL 1.13(H) 0.84 0.73  Sodium 135 - 145 mmol/L 133(L) 135 137  Potassium 3.5 - 5.1 mmol/L 4.3 5.3(H) 4.9  Chloride 101 - 111 mmol/L 98(L) 105 101  CO2 22 - 32 mmol/L 25 26 30   Calcium 8.9 - 10.3 mg/dL 8.5(L) 9.1 8.9  Total Protein - - - 5.9(L)  Total Bilirubin - - - 1.4(H)  Alkaline Phos - - - 120(H)  AST - - - 21  ALT - - - 22   CBC Latest Ref Rng & Units 12/12/2017 06/12/2017 03/22/2017  WBC 4.0 - 10.5 K/uL 4.9 4.9 7.2  Hemoglobin 12.0 - 15.0 g/dL 10.5(L) 10.8(L) 13.3  Hematocrit 36.0 - 46.0 % 32.7(L) 32.5(L) 37.5  Platelets 150 - 400 K/uL 194 224 285   Lipid Panel  No results found for: CHOL, TRIG, HDL, CHOLHDL, VLDL, LDLCALC, LDLDIRECT HEMOGLOBIN A1C No results found for: HGBA1C, MPG TSH No results for input(s): TSH in the last 8760 hours. Medications   Current Outpatient Medications  Medication Instructions  . albuterol (PROVENTIL HFA;VENTOLIN HFA) 108 (90 Base) MCG/ACT inhaler 1-2 puffs, Inhalation, Every 6 hours PRN  . ferrous sulfate 325 mg, Oral, Daily with breakfast  . furosemide (LASIX) 40 mg, Oral, Daily  . insulin lispro (HUMALOG) 100 UNIT/ML injection Subcutaneous, See admin instructions, Insulin Pump - Mini Medtronic 670G   . lisinopril (ZESTRIL) 5 MG tablet 1 tablet, Oral, Daily  . loratadine (CLARITIN) 10 MG tablet 1 tablet, Oral, Daily PRN  . metoprolol succinate (TOPROL-XL) 25 mg, Oral, Daily, Take with or immediately following a meal.  . tobramycin (TOBREX) 0.3 % ophthalmic solution 2 drops, Both Eyes, Daily  . Vitamin D 2,000 Units, Oral, Daily    Cardiac Studies:   Coronary angiogram 05/08/2017: Ost 1st Diag to 1st Diag lesion, 80%stenosed. Prox LAD lesion, 40% stenosed. Ost 2nd Diag lesion, 60% stenosed.  Nuclear stress test  [03/26/2017]: 1. Patient attempted treadmill stress, terminated after 2  minutes due to dyspnea. Converted to pharmacologic stress. The resting electrocardiogram demonstrated normal sinus rhythm, normal resting conduction, no resting arrhythmias and normal rest repolarization. Stress EKG is non-diagnostic for ischemia as it a pharmacologic stress using Lexiscan. Stress symptoms included dyspnea. 2. The LV is dilated both at rest and stress images. The LV end diastolic volume was 99991111. There is a moderate area of ischemia in the basal anterior, basal anteroseptal, mid anterior, mid anteroseptal and apical anterior myocardial wall(s). Overall left ventricular systolic function was abnormal without regional wall motion abnormalities. The left ventricular ejection fraction was calculated or visually estimated to be 41%. This is a high risk study, consider further cardiac work-up  Echocardiogram  [03/20/2017]: Left ventricle cavity is normal in size. Moderate concentric hypertrophy of the left ventricle. Normal global wall motion. Doppler evidence of grade II (pseudonormal) diastolic dysfunction. Diastolic dysfunction suggests elevated LA/LV endiastolic pressure. Calculated EF 61%. Trace tricuspid regurgitation. Unable to estimate PA pressure due to absence/minimal TR signal. IVC is dilated with blunted respiratory response. Suggests elevated central venous pressure.  Assessment     ICD-10-CM   1. Atherosclerosis of native coronary artery of native heart without angina pectoris  I25.10   2. Essential hypertension  I10 EKG 12-Lead  3. Orthostatic hypotension  I95.1   4. Controlled type 2 diabetes mellitus with stage 3 chronic kidney disease, with long-term current use of insulin (HCC)  E11.22    N18.30    Z79.4     EKG 06/18/2019: Sinus rhythm with first-degree AV block at the rate of 61 bpm, otherwise normal EKG.  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 Hollie Salk and also diabetic peripheral neuropathy, hypertension, CAD by coronary angiography in 2018 revealing proximal LAD 40% stenosis, D1 80% stenosis and D2 60% stenosis, on medical therapy presents here for annual visit.  No angina, dyspnea  is chronic and stable, no CHF on exam. On appropriate medical therapy.   She has discontinued taking her Lipitor, advised her to restart 10 mg daily, I would like to obtain direct LDL and also lipid profile testing in 6-8 weeks, her LDL goal is less than a quarter 70 mg deciliter.  Otherwise she remained stable from cardiac standpoint, I'll see her back on a p.r.n. basis.  Adrian Prows, MD, [facility] 08/18/2019, 12:32 PM [facility] Pager: [phone] Office: [phone] If no answer Cell [phone]  CC: Dr. Hollie Salk, MD (Nephrology)

## 2019-09-03 ENCOUNTER — Other Ambulatory Visit: Payer: Self-pay | Admitting: Cardiology

## 2019-10-07 DIAGNOSIS — I10 Essential (primary) hypertension: Secondary | ICD-10-CM | POA: Insufficient documentation

## 2019-10-07 DIAGNOSIS — I25118 Atherosclerotic heart disease of native coronary artery with other forms of angina pectoris: Secondary | ICD-10-CM | POA: Insufficient documentation

## 2019-10-07 DIAGNOSIS — I2510 Atherosclerotic heart disease of native coronary artery without angina pectoris: Secondary | ICD-10-CM | POA: Insufficient documentation

## 2019-10-08 ENCOUNTER — Other Ambulatory Visit: Payer: Self-pay | Admitting: Cardiology

## 2019-10-09 ENCOUNTER — Encounter: Payer: Self-pay | Admitting: Cardiology

## 2019-10-09 LAB — LIPID PANEL WITH LDL/HDL RATIO
Cholesterol, Total: 164 mg/dL (ref 100–199)
HDL: 99 mg/dL (ref >39)
LDL Chol Calc (NIH): 54 mg/dL (ref 0–99)
LDL/HDL Ratio: 0.5 ratio (ref 0.0–3.2)
Triglycerides: 52 mg/dL (ref 0–149)
VLDL Cholesterol Cal: 11 mg/dL (ref 5–40)

## 2019-10-09 LAB — LDL CHOLESTEROL, DIRECT: LDL Direct: 61 mg/dL (ref 0–99)

## 2019-10-17 ENCOUNTER — Ambulatory Visit: Payer: 59 | Admitting: Cardiology

## 2019-10-17 ENCOUNTER — Other Ambulatory Visit: Payer: Self-pay

## 2019-10-17 ENCOUNTER — Encounter: Payer: Self-pay | Admitting: Cardiology

## 2019-10-17 VITALS — BP 131/73 | HR 57 | Temp 97.9°F | Ht 63.0 in | Wt 206.6 lb

## 2019-10-17 DIAGNOSIS — E1122 Type 2 diabetes mellitus with diabetic chronic kidney disease: Secondary | ICD-10-CM | POA: Diagnosis not present

## 2019-10-17 DIAGNOSIS — Z01810 Encounter for preprocedural cardiovascular examination: Secondary | ICD-10-CM

## 2019-10-17 DIAGNOSIS — I129 Hypertensive chronic kidney disease with stage 1 through stage 4 chronic kidney disease, or unspecified chronic kidney disease: Secondary | ICD-10-CM

## 2019-10-17 DIAGNOSIS — N183 Chronic kidney disease, stage 3 unspecified: Secondary | ICD-10-CM

## 2019-10-17 DIAGNOSIS — I2510 Atherosclerotic heart disease of native coronary artery without angina pectoris: Secondary | ICD-10-CM

## 2019-10-17 DIAGNOSIS — I10 Essential (primary) hypertension: Secondary | ICD-10-CM

## 2019-10-17 DIAGNOSIS — I471 Supraventricular tachycardia: Secondary | ICD-10-CM

## 2019-10-17 DIAGNOSIS — Z794 Long term (current) use of insulin: Secondary | ICD-10-CM

## 2019-10-17 NOTE — Progress Notes (Signed)
Primary Physician/Referring:  Orpah Melter, MD  Patient ID: Colleen [name], female    DOB: 10/30/1965, 54 y.o.   MRN: [id number]  Chief Complaint  Patient presents with  . Pre-op Exam    ankle surgery  . 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 Hollie Salk and also peripheral neuropathy, hypertension, CAD by coronary angiography in 2018 revealing proximal LAD 40% stenosis, D1 80% stenosis and D2 60% stenosis, on medical therapy presents here for annual visit.  During preop workup for left ankle arthrodesis at [facility], she was found to have abnormal EKG and the surgery was canceled and she is now referred back to me for cardiac risk stratification.  She has had left shoulder surgery in July 2020, and right knee meniscectomy in Sept 2020.  No angina, states hypertension has been well controlled. She has chronic shortness of breath.  She has had occasional episodes of palpitations but rarely and last more than 5-10 minutes sometimes at most 15 minutes.  It appears sometimes she has back pain associated with palpitations and performing Valsalva maneuver seems to have helped.  No dizziness or syncope.  Past Medical History:  Diagnosis Date  . Asthma   . Complication of anesthesia    Psuedocholinerasterase deficiency per pt  . Diabetes mellitus    TYPE II  . Diabetic macular edema (HCC)    Pt gets injections in eyes with Eyelea  . Seasonal allergies    Past Surgical History:  Procedure Laterality Date  . BACK SURGERY  2009  . CATARACT SURGERY     BILATERAL  . CESAREAN SECTION     X2  . KNEE SURGERY Right   . LEFT HEART CATH AND CORONARY ANGIOGRAPHY N/A 05/08/2017   Procedure: Left Heart Cath and Coronary Angiography;  Surgeon: Nigel Mormon, MD;   Location: [facility];  Service: Cardiovascular;  Laterality: N/A;  . ORIF ANKLE FRACTURE Left 03/23/2016   Procedure: OPEN REDUCTION INTERNAL FIXATION (ORIF) ANKLE FRACTURE MALLEOLUS  MALUNION WITH TIBIAL AND FIBULAR OSTEOTOMY AND AUTOGRAFT;  Surgeon: Wylene Simmer, MD;  Location: [place];  Service: Orthopedics;  Laterality: Left;  . SHOULDER ARTHROSCOPY W/ ROTATOR CUFF REPAIR Left    Social History   Socioeconomic History  . Marital status: Married    Spouse name: Not on file  . Number of children: 2  . Years of education: Not on file  . Highest education level: Not on file  Occupational History  . Not on file  Tobacco Use  . Smoking status: Former Smoker    Packs/day: 0.25    Years: 2.00    Pack years: 0.50    Types: Cigarettes    Quit date: 01/17/1987    Years since quitting: 32.7  . Smokeless tobacco: Never Used  Substance and Sexual Activity  . Alcohol use: Yes    Comment: occ  . Drug use: No  . Sexual activity: Yes    Comment: PATIENT'S PARTNER WITH VASECTOMY  Other Topics Concern  . Not on file  Social History Narrative  . Not on file   Social Determinants of Health   Financial Resource Strain:   . Difficulty of Paying Living Expenses: Not on file  Food Insecurity:   . Worried About Estate manager/land agent  of Food in the Last Year: Not on file  . Ran Out of Food in the Last Year: Not on file  Transportation Needs:   . Lack of Transportation (Medical): Not on file  . Lack of Transportation (Non-Medical): Not on file  Physical Activity:   . Days of Exercise per Week: Not on file  . Minutes of Exercise per Session: Not on file  Stress:   . Feeling of Stress : Not on file  Social Connections:   . Frequency of Communication with Friends and Family: Not on file  . Frequency of Social Gatherings with Friends and Family: Not on file  . Attends Religious Services: Not on file  . Active Member of Clubs or Organizations: Not on file  . Attends Theatre manager Meetings: Not on file  . Marital Status: Not on file  Intimate Partner Violence:   . Fear of Current or Ex-Partner: Not on file  . Emotionally Abused: Not on file  . Physically Abused: Not on file  . Sexually Abused: Not on file   ROS  Review of Systems  Constitution: Negative for decreased appetite, malaise/fatigue, weight gain and weight loss.  Eyes: Negative for visual disturbance.  Cardiovascular: Positive for dyspnea on exertion (mild), leg swelling (takes PRN lasix) and palpitations. Negative for chest pain, claudication, orthopnea and syncope.  Respiratory: Negative for hemoptysis and wheezing.   Endocrine: Negative for cold intolerance and heat intolerance.  Hematologic/Lymphatic: Does not bruise/bleed easily.  Skin: Negative for nail changes.  Musculoskeletal: Positive for back pain. Negative for muscle weakness and myalgias.  Gastrointestinal: Negative for abdominal pain, change in bowel habit, nausea and vomiting.  Neurological: Negative for difficulty with concentration, dizziness, focal weakness and headaches.  Psychiatric/Behavioral: Negative for altered mental status and suicidal ideas.  All other systems reviewed and are negative.  Objective  Blood pressure 131/73, pulse (!) 57, temperature 97.9 F (36.6 C), height 5\' 3"  (1.6 m), weight 206 lb 9.6 oz (93.7 kg), SpO2 100 %. Body mass index is 36.6 kg/m.   Physical Exam  Constitutional: She is oriented to person, place, and time. Vital signs are normal. She appears well-developed and well-nourished.  Mildly obese  HENT:  Head: Normocephalic and atraumatic.  Cardiovascular: Normal rate, regular rhythm, normal heart sounds and intact distal pulses.  Pulses:      Femoral pulses are 2+ on the right side and 2+ on the left side.      Popliteal pulses are 2+ on the right side and 2+ on the left side.       Dorsalis pedis pulses are 2+ on the right side and 2+ on the left side.       Posterior tibial pulses  are 2+ on the right side and 2+ on the left side.  Trace edema  Pulmonary/Chest: Effort normal and breath sounds normal. No accessory muscle usage. No respiratory distress.  Abdominal: Soft. Bowel sounds are normal.  Musculoskeletal:        General: Normal range of motion.     Cervical back: Normal range of motion.  Neurological: She is alert and oriented to person, place, and time.  Skin: Skin is warm and dry.  Vitals reviewed.  Radiology: No results found.  Laboratory examination:   Lipid profile 04/18/2017: Total cholesterol 194, triglycerides 43, HDL 104, LDL 81.  No results for input(s): NA, K, CL, CO2, GLUCOSE, BUN, CREATININE, CALCIUM, GFRNONAA, GFRAA in the last 8760 hours. CMP Latest Ref Rng & Units 03/22/2017 03/21/2016 02/25/2008  Glucose 65 - 99 mg/dL 238(H) 106(H) 242(H)  BUN 6 - 20 mg/dL 32(H) 25(H) 16  Creatinine 0.44 - 1.00 mg/dL 1.13(H) 0.84 0.73  Sodium 135 - 145 mmol/L 133(L) 135 137  Potassium 3.5 - 5.1 mmol/L 4.3 5.3(H) 4.9  Chloride 101 - 111 mmol/L 98(L) 105 101  CO2 22 - 32 mmol/L 25 26 30   Calcium 8.9 - 10.3 mg/dL 8.5(L) 9.1 8.9  Total Protein - - - 5.9(L)  Total Bilirubin - - - 1.4(H)  Alkaline Phos - - - 120(H)  AST - - - 21  ALT - - - 22   CBC Latest Ref Rng & Units 12/12/2017 06/12/2017 03/22/2017  WBC 4.0 - 10.5 K/uL 4.9 4.9 7.2  Hemoglobin 12.0 - 15.0 g/dL 10.5(L) 10.8(L) 13.3  Hematocrit 36.0 - 46.0 % 32.7(L) 32.5(L) 37.5  Platelets 150 - 400 K/uL 194 224 285   Lipid Panel     Component Value Date/Time   CHOL 164 10/08/2019 0901   TRIG 52 10/08/2019 0901   HDL 99 10/08/2019 0901   LDLCALC 54 10/08/2019 0901   LDLDIRECT 61 10/08/2019 0901   HEMOGLOBIN A1C No results found for: HGBA1C, MPG TSH No results for input(s): TSH in the last 8760 hours.   Lab Results  10/07/2019  WBC 5.2 10/07/2019  HGB 12.2 10/07/2019  HCT 36.8 10/07/2019  PLT 165 10/07/2019   NA 136 10/07/2019  K 4.6 10/07/2019  CL 106 10/07/2019  CO2 23 10/07/2019  BUN  24 (H) 10/07/2019  CREATININE 1.3 (H) 10/07/2019  GLUCOSE 151 (H) 10/07/2019  CALCIUM 8.8 10/07/2019  GFR 46 10/07/2019   Lab Results  Component Value Date  AST 22 10/07/2019  ALT 14 10/07/2019  TBILI 1.1 10/07/2019  ALKPHOS 139 (H) 10/07/2019  ALB 3.7 10/07/2019   Medications   Current Outpatient Medications  Medication Instructions  . albuterol (PROVENTIL HFA;VENTOLIN HFA) 108 (90 Base) MCG/ACT inhaler 1-2 puffs, Inhalation, Every 6 hours PRN  . atorvastatin (LIPITOR) 10 mg, Oral, Daily  . ferrous sulfate 325 mg, Oral, Daily with breakfast  . furosemide (LASIX) 40 mg, Oral, As needed  . insulin lispro (HUMALOG) 100 UNIT/ML injection Subcutaneous, See admin instructions, Insulin Pump - Mini Medtronic 670G   . insulin NPH Human (NOVOLIN N) 100 UNIT/ML injection Subcutaneous  . lisinopril (ZESTRIL) 5 MG tablet 1 tablet, Oral, Daily  . loratadine (CLARITIN) 10 MG tablet 1 tablet, Oral, Daily PRN  . metoprolol succinate (TOPROL-XL) 50 MG 24 hr tablet TAKE 1 TABLET BY MOUTH  DAILY  . tobramycin (TOBREX) 0.3 % ophthalmic solution 2 drops, Both Eyes, As needed  . Vitamin D 2,000 Units, Oral, Daily    Cardiac Studies:   Coronary angiogram 05/08/2017: Ost 1st Diag to 1st Diag lesion, 80%stenosed. Prox LAD lesion, 40% stenosed. Ost 2nd Diag lesion, 60% stenosed.  Nuclear stress test  [03/26/2017]: 1. Patient attempted treadmill stress, terminated after 2 minutes due to dyspnea. Converted to pharmacologic stress. The resting electrocardiogram demonstrated normal sinus rhythm, normal resting conduction, no resting arrhythmias and normal rest repolarization. Stress EKG is non-diagnostic for ischemia as it a pharmacologic stress using Lexiscan. Stress symptoms included dyspnea. 2. The LV is dilated both at rest and stress images. The LV end diastolic volume was 99991111. There is a moderate area of ischemia in the basal anterior, basal anteroseptal, mid anterior, mid anteroseptal and apical  anterior myocardial wall(s). Overall left ventricular systolic function was abnormal without regional wall motion abnormalities. The left ventricular ejection fraction was calculated or visually  estimated to be 41%. This is a high risk study, consider further cardiac work-up  Echocardiogram  [03/20/2017]: Left ventricle cavity is normal in size. Moderate concentric hypertrophy of the left ventricle. Normal global wall motion. Doppler evidence of grade II (pseudonormal) diastolic dysfunction. Diastolic dysfunction suggests elevated LA/LV endiastolic pressure. Calculated EF 61%. Trace tricuspid regurgitation. Unable to estimate PA pressure due to absence/minimal TR signal. IVC is dilated with blunted respiratory response. Suggests elevated central venous pressure.  Assessment     ICD-10-CM   1. Preoperative cardiovascular examination  Z01.810   2. Atherosclerosis of native coronary artery of native heart without angina pectoris  I25.10 EKG 12-Lead  3. Essential hypertension  I10   4. Controlled type 2 diabetes mellitus with stage 3 chronic kidney disease, with long-term current use of insulin (HCC)  E11.22    N18.30    Z79.4   5. Junctional tachycardia ([place])  I47.1     EKG 10/16/2018: Sinus rhythm with marked first-degree AV block, PR interval 300 ms, ventricular rate 68 bpm.  Left atrial enlargement.  Normal axis.  Poor R-wave progression, cannot exclude anteroseptal infarct old.  Borderline low voltage complexes. No significant change from  EKG 06/18/2019   EKG 10/07/2019: Junctional tachycardia at the rate of 111 bpm, normal axis, inferior and lateral horizontal ST depression of 1 mm, cannot exclude some endocardial ischemia.  Abnormal EKG.  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 Hollie Salk and also  diabetic peripheral neuropathy, hypertension, CAD by coronary angiography in 2018 revealing proximal LAD 40% stenosis, D1 80% stenosis and D2 60% stenosis, on medical therapy presents here for Preoperative visit, she was scheduled for left ankle arthrodesis however the EKG obtained on 10/07/2019 revealed junctional tachycardia with ST-T wave changes suggestive ischemia hence surgery was canceled.   I reviewed the EKG personally and interpreted.  I also reviewed her external labs, renal function is stable, CBC stable, I also reviewed internal labs and lipids since being on Lipitor are very well controlled.  She is very minimal symptoms from the junctional tachycardia.  Suspect the ST changes were related to the rate being high, she has had recent cardiac catheterization essentially low risk.  She has occasional symptoms of palpitations approximately one every month or less sometimes and only brief.  She can certainly try extra half dose of metoprolol succinate.  I do not think this should prevent her from having surgery, low risk overall, she has had left shoulder arthroplasty July 2020 and right knee meniscectomy in October 2020 without any operative complications.   Blood pressure is well controlled, home recordings of blood pressure is also under excellent control. being on Lipitor 10 mg lipids are also under excellent control. No angina, dyspnea is chronic and stable, no CHF on exam. On appropriate medical therapy. I will see her back annually for junctional tachycardia and f/u hypertension and moderate CAD.   Adrian Prows, MD, [facility] 10/17/2019, 12:30 PM [facility] [place]  CC: Dr. Hollie Salk, MD (Nephrology); Shon Baton, MD ([facility])

## 2019-10-18 ENCOUNTER — Encounter: Payer: Self-pay | Admitting: Cardiology

## 2019-10-20 ENCOUNTER — Other Ambulatory Visit: Payer: Self-pay | Admitting: Cardiology

## 2019-10-20 DIAGNOSIS — I2510 Atherosclerotic heart disease of native coronary artery without angina pectoris: Secondary | ICD-10-CM

## 2019-12-12 ENCOUNTER — Other Ambulatory Visit: Payer: Self-pay | Admitting: Family Medicine

## 2019-12-12 DIAGNOSIS — Z1231 Encounter for screening mammogram for malignant neoplasm of breast: Secondary | ICD-10-CM

## 2019-12-15 ENCOUNTER — Ambulatory Visit
Admission: RE | Admit: 2019-12-15 | Discharge: 2019-12-15 | Disposition: A | Discharge status: Home / Self Care | Payer: 59 | Source: Ambulatory Visit | Attending: Family Medicine | Admitting: Family Medicine

## 2019-12-15 ENCOUNTER — Other Ambulatory Visit: Payer: Self-pay

## 2019-12-15 DIAGNOSIS — Z1231 Encounter for screening mammogram for malignant neoplasm of breast: Secondary | ICD-10-CM

## 2020-07-07 ENCOUNTER — Other Ambulatory Visit: Payer: Self-pay | Admitting: Cardiology

## 2020-09-29 ENCOUNTER — Other Ambulatory Visit: Payer: Self-pay

## 2020-09-29 ENCOUNTER — Encounter: Payer: Self-pay | Admitting: Cardiology

## 2020-09-29 ENCOUNTER — Ambulatory Visit: Payer: 59 | Admitting: Cardiology

## 2020-09-29 VITALS — BP 160/84 | HR 54 | Resp 16 | Ht 63.0 in | Wt 214.0 lb

## 2020-09-29 DIAGNOSIS — R0600 Dyspnea, unspecified: Secondary | ICD-10-CM

## 2020-09-29 DIAGNOSIS — R0609 Other forms of dyspnea: Secondary | ICD-10-CM

## 2020-09-29 DIAGNOSIS — I951 Orthostatic hypotension: Secondary | ICD-10-CM

## 2020-09-29 DIAGNOSIS — I440 Atrioventricular block, first degree: Secondary | ICD-10-CM

## 2020-09-29 DIAGNOSIS — I10 Essential (primary) hypertension: Secondary | ICD-10-CM

## 2020-09-29 DIAGNOSIS — I2510 Atherosclerotic heart disease of native coronary artery without angina pectoris: Secondary | ICD-10-CM

## 2020-09-29 MED ORDER — AMLODIPINE BESYLATE 5 MG PO TABS
5.0000 mg | ORAL_TABLET | Freq: Every evening | ORAL | 2 refills | Status: DC
Start: 2020-09-29 — End: 2020-12-30

## 2020-09-29 NOTE — Progress Notes (Signed)
Primary Physician/Referring:  Orpah Melter, MD  Patient ID: Colleen [name], female    DOB: 1966-09-02, 55 y.o.   MRN: [id number]  Chief Complaint  Patient presents with  . Hypertension  . Coronary Artery Disease  . 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 Hollie Salk and also peripheral neuropathy, hypertension, CAD by coronary angiography in 2018 revealing proximal LAD 40% stenosis, D1 80% stenosis and D2 60% stenosis, on medical therapy presents here for annual visit.  Underwent left ankle arthrodesis due to Charcot joint in 2021 and has healed up well.  She now presents for follow-up and states that she has gained weight since surgery she did not exercise much.  Denies chest pain, dyspnea has remained stable, takes Lasix as needed for leg edema.     With regard to palpitations, states that her symptoms are much less now compared to previous.  They only last a few seconds.   Past Medical History:  Diagnosis Date  . Asthma   . Complication of anesthesia    Psuedocholinerasterase deficiency per pt  . Diabetes mellitus    TYPE II  . Diabetic macular edema (HCC)    Pt gets injections in eyes with Eyelea  . Seasonal allergies    Past Surgical History:  Procedure Laterality Date  . BACK SURGERY  2009  . CATARACT SURGERY     BILATERAL  . CESAREAN SECTION     X2  . KNEE SURGERY Right   . LEFT HEART CATH AND CORONARY ANGIOGRAPHY N/A 05/08/2017   Procedure: Left Heart Cath and Coronary Angiography;  Surgeon: Nigel Mormon, MD;  Location: [facility];  Service: Cardiovascular;  Laterality: N/A;  . ORIF ANKLE FRACTURE Left 03/23/2016   Procedure: OPEN REDUCTION INTERNAL FIXATION (ORIF) ANKLE FRACTURE MALLEOLUS  MALUNION WITH TIBIAL AND FIBULAR OSTEOTOMY AND  AUTOGRAFT;  Surgeon: Wylene Simmer, MD;  Location: [facility];  Service: Orthopedics;  Laterality: Left;  . SHOULDER ARTHROSCOPY W/ ROTATOR CUFF REPAIR Left    Social History   Tobacco Use  . Smoking status: Former Smoker    Packs/day: 0.25    Years: 2.00    Pack years: 0.50    Types: Cigarettes    Quit date: 01/17/1987    Years since quitting: 33.7  . Smokeless tobacco: Never Used  Substance Use Topics  . Alcohol use: Yes    Comment: occ   Marital Status: Married   ROS  Review of Systems  Constitutional: Positive for weight gain. Negative for decreased appetite and malaise/fatigue.  Eyes: Positive for visual disturbance (chronic, diabetic macular edema).  Cardiovascular: Positive for dyspnea on exertion (mild), leg swelling (takes PRN lasix) and palpitations (improved). Negative for chest pain, claudication, orthopnea and syncope.  Skin: Negative for nail changes.  Musculoskeletal: Positive for back pain.  Gastrointestinal: Negative for abdominal pain and change in bowel habit.  Neurological: Negative for difficulty with concentration and dizziness.  Psychiatric/Behavioral: Negative for altered mental status and suicidal ideas.  All other systems reviewed and are negative.  Objective  Blood pressure (!) 160/84, pulse (!) 54, resp. rate 16, height 5\' 3"  (1.6 m), weight 214 lb (97.1 kg), SpO2 99 %. Body mass index is 37.91 kg/m.   Vitals with BMI 09/29/2020 09/29/2020 09/29/2020  Height - -  5\' 3"   Weight 214 lbs 214 lbs 214 lbs  BMI 37.92 37.92 37.92  Systolic 160 185  Diastolic 84 92 91  Pulse - 54 54      Physical Exam Vitals reviewed.  Constitutional:      Appearance: She is well-developed.     Comments: Mildly obese  HENT:     Head: Normocephalic and atraumatic.  Cardiovascular:     Rate and Rhythm: Normal rate and regular rhythm.     Pulses: Intact distal pulses.          Femoral pulses are 2+ on the right side and 2+ on the left side.       Popliteal pulses are 2+ on the right side and 2+ on the left side.       Dorsalis pedis pulses are 2+ on the right side and 2+ on the left side.       Posterior tibial pulses are 2+ on the right side and 2+ on the left side.     Heart sounds: Normal heart sounds.     Comments: Trace edema Pulmonary:     Effort: Pulmonary effort is normal. No accessory muscle usage or respiratory distress.     Breath sounds: Normal breath sounds.  Abdominal:     General: Bowel sounds are normal.     Palpations: Abdomen is soft.  Musculoskeletal:        General: Normal range of motion.     Cervical back: Normal range of motion.  Skin:    General: Skin is warm and dry.  Neurological:     Mental Status: She is alert and oriented to person, place, and time.    Radiology: No results found.  Laboratory examination:   Lipid Panel     Component Value Date/Time   CHOL 164 10/08/2019 0901   TRIG 52 10/08/2019 0901   HDL 99 10/08/2019 0901   LDLCALC 54 10/08/2019 0901   LDLDIRECT 61 10/08/2019 0901    External labs:   Labs 03/03/2020: Total cholesterol 139, triglycerides 80, HDL 55, LDL 68.  A1c  7.2%    02/27/2020 TSH 1.700 04/05/2020  Hemoglobin 11.600 G/ 05/20/2020 Platelets 251.000 X1 05/20/2020  Creatinine, Serum 1.370 MG/ 05/20/2020 Potassium 5.400 mm 04/05/2020 ALT (SGPT) 11.000 IU/ 04/05/2020  WBC 5.2 10/07/2019  HGB 12.2 10/07/2019  HCT 36.8 10/07/2019  PLT 165 10/07/2019   NA 136 10/07/2019  K 4.6 10/07/2019  CL 106 10/07/2019  CO2 23 10/07/2019  BUN 24 (H) 10/07/2019  CREATININE 1.3 (H) 10/07/2019  GLUCOSE 151 (H) 10/07/2019  CALCIUM 8.8 10/07/2019  GFR 46 10/07/2019   Lab Results  Component Value Date  AST 22 10/07/2019  ALT 14 10/07/2019  TBILI 1.1 10/07/2019  ALKPHOS 139 (H) 10/07/2019  ALB 3.7 10/07/2019   Medications   Allergies  Allergen Reactions  . Other Other (See Comments)  . Succinylcholine Other (See Comments)    Pseudocholinesterase deficiency     Current Outpatient Medications on File Prior to Visit  Medication Sig Dispense Refill  . albuterol (PROVENTIL HFA;VENTOLIN HFA) 108 (90 Base) MCG/ACT inhaler Inhale 1-2 puffs into the lungs every 6 (six) hours as needed for wheezing or shortness of breath.    . Cholecalciferol (VITAMIN D) 2000 units tablet Take 2,000 Units by mouth daily.    . ferrous sulfate 325 (65 FE) MG tablet Take 325 mg by mouth daily with breakfast.    . furosemide (LASIX) 40 MG tablet Take 40 mg by mouth as needed  for edema.   2  . insulin lispro (HUMALOG) 100 UNIT/ML injection Inject into the skin See admin instructions. Insulin Pump - Mini Medtronic 670G    . insulin NPH Human (NOVOLIN N) 100 UNIT/ML injection Inject into the skin.    Marland Kitchen. lisinopril (ZESTRIL) 5 MG tablet Take 1 tablet by mouth daily.    Marland Kitchen. loratadine (CLARITIN) 10 MG tablet Take 1 tablet by mouth daily as needed.    . metoprolol succinate (TOPROL-XL) 50 MG 24 hr tablet TAKE 1 TABLET BY MOUTH  DAILY 90 tablet 3  . rosuvastatin (CRESTOR) 10 MG tablet Take 10 mg by mouth at bedtime.    Marland Kitchen. tobramycin (TOBREX) 0.3 % ophthalmic solution Place 2 drops into both eyes as needed (prior to injections).      No current facility-administered medications on file prior to visit.     Cardiac Studies:   Coronary angiogram 05/08/2017: Ost 1st Diag to 1st Diag lesion, 80%stenosed. Prox LAD lesion, 40% stenosed. Ost 2nd Diag lesion, 60% stenosed.  Nuclear stress test  [03/26/2017]: 1. Patient attempted treadmill stress, terminated after 2 minutes due to dyspnea. Converted to pharmacologic stress. The resting electrocardiogram demonstrated normal sinus rhythm, normal resting conduction, no resting arrhythmias and normal rest repolarization. Stress EKG is non-diagnostic for ischemia as it a pharmacologic stress using Lexiscan. Stress symptoms included dyspnea. 2. The LV is dilated both at rest and stress images. The LV end diastolic volume was 146mL. There is a moderate area  of ischemia in the basal anterior, basal anteroseptal, mid anterior, mid anteroseptal and apical anterior myocardial wall(s). Overall left ventricular systolic function was abnormal without regional wall motion abnormalities. The left ventricular ejection fraction was calculated or visually estimated to be 41%. This is a high risk study, consider further cardiac work-up  Echocardiogram  [03/20/2017]: Left ventricle cavity is normal in size. Moderate concentric hypertrophy of the left ventricle. Normal global wall motion. Doppler evidence of grade II (pseudonormal) diastolic dysfunction. Diastolic dysfunction suggests elevated LA/LV endiastolic pressure. Calculated EF 61%. Trace tricuspid regurgitation. Unable to estimate PA pressure due to absence/minimal TR signal. IVC is dilated with blunted respiratory response. Suggests elevated central venous pressure.  EKG:   EKG 09/29/2020: Marked sinus bradycardia at rate of 54 bpm with first-degree AV block, poor R wave progression, probably normal variant, cannot exclude anteroseptal infarct old.  No evidence of ischemia, normal QT interval.  No significant change compared to 10/16/2018. No significant change from  EKG 06/18/2019   Assessment     ICD-10-CM   1. Atherosclerosis of native coronary artery of native heart without angina pectoris  I25.10 EKG 12-Lead  2. Essential hypertension  I10 amLODipine (NORVASC) 5 MG tablet  3. 1st degree AV block  I44.0   4. Dyspnea on exertion  R06.00   5. Orthostatic hypotension  I95.1      Meds ordered this encounter  Medications  . amLODipine (NORVASC) 5 MG tablet    Sig: Take 1 tablet (5 mg total) by mouth every evening.    Dispense:  30 tablet    Refill:  2   Medications Discontinued During This Encounter  Medication Reason  . atorvastatin (LIPITOR) 10 MG tablet Change in therapy  . atorvastatin (LIPITOR) 10 MG tablet Change in therapy   On Crestor  Recommendations:   Colleen [name]  is a 55 y.o.  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 Hollie Salk and also diabetic peripheral neuropathy, hypertension, CAD by coronary angiography in 2018 revealing proximal LAD 40% stenosis, D1 80% stenosis and D2 60% stenosis, on medical therapy.  Underwent left ankle arthrodesis due to Charcot joint in 2021 and has healed up well.  She now presents for follow-up and states that she has gained weight since surgery she did not exercise much.  Denies chest pain, dyspnea has remained stable, takes Lasix as needed for leg edema.   I am concerned about her weight gain, extensive discussion was held with regard to weight loss again.  Patient already has high risk features with regard to vascular disease, has diabetic macular degeneration/macular edema, diabetic peripheral neuropathy and underlying coronary artery disease.  I reviewed her external records, labs are within normal limits with regard to her lipid control and renal function has remained stable.  She is on minimal dose of lisinopril due to hyperkalemia.  Blood pressure is elevated today, will add amlodipine 5 mg in the evening.  I would like to see her back in 6 weeks for close monitoring of her hypertension.  She also has orthostatic hypotension, will check orthostasis on her next office visit.  She is presently tolerating moderate dose of metoprolol and has underlying first-degree block but remains asymptomatic.  We will continue present dose.  With regard to dyspnea this is remained stable and there is no clinical evidence heart failure.   Adrian Prows, MD, [facility] 09/29/2020, 3:01 PM Office: [phone] Pager: [phone]   CC: Dr. Hollie Salk, MD (Nephrology)

## 2020-10-15 ENCOUNTER — Ambulatory Visit: Payer: 59 | Admitting: Cardiology

## 2020-11-15 ENCOUNTER — Ambulatory Visit: Payer: 59 | Admitting: Cardiology

## 2020-11-15 ENCOUNTER — Encounter: Payer: Self-pay | Admitting: Student

## 2020-11-15 ENCOUNTER — Other Ambulatory Visit: Payer: Self-pay

## 2020-11-15 ENCOUNTER — Ambulatory Visit: Payer: 59 | Admitting: Student

## 2020-11-15 VITALS — BP 167/81 | HR 75 | Temp 98.1°F | Resp 17 | Ht 63.0 in | Wt 208.0 lb

## 2020-11-15 DIAGNOSIS — I10 Essential (primary) hypertension: Secondary | ICD-10-CM

## 2020-11-15 NOTE — Progress Notes (Signed)
Primary Physician/Referring:  Orpah Melter, MD  Patient ID: Colleen [name], female    DOB: 19-Apr-1966, 55 y.o.   MRN: [id number]  Chief Complaint  Patient presents with  . Hypertension    6 WEEKS  . Hyperlipidemia  . Coronary Artery Disease  . 1ST DEGREE AV BLOCK  . 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 Hollie Salk and also peripheral neuropathy, hypertension, CAD by coronary angiography in 2018 revealing proximal LAD 40% stenosis, D1 80% stenosis and D2 60% stenosis, on medical therapy. Patient underwent left ankle arthrodesis due to Charcot joint in 2021 and has healed well.    Patient presents for 6-week follow-up of hypertension, last visit added amlodipine 5 mg in the evening.  Since the addition of amlodipine patient has noticed more frequent episodes of symptomatic hypotension, particularly in the morning when she first wakes up noticing blood pressures as low as 77/59 mmHg.  These episodes occur approximately twice per week now since adding amlodipine.  During these episodes patient experiences dizziness, fatigue, shortness of breath.  Patient states on average her home blood pressure readings are 121/82 mmHg.  She denies chest pain, palpitations, leg edema, orthopnea, PND.  Notably following her ankle surgery last year patient did gain weight, however since January she has lost 6 pounds.  Past Medical History:  Diagnosis Date  . Asthma   . Complication of anesthesia    Psuedocholinerasterase deficiency per pt  . Diabetes mellitus    TYPE II  . Diabetic macular edema (HCC)    Pt gets injections in eyes with Eyelea  . Seasonal allergies    Past Surgical History:  Procedure Laterality Date  . BACK SURGERY  2009  . CATARACT SURGERY     BILATERAL  . CESAREAN  SECTION     X2  . KNEE SURGERY Right   . LEFT HEART CATH AND CORONARY ANGIOGRAPHY N/A 05/08/2017   Procedure: Left Heart Cath and Coronary Angiography;  Surgeon: Nigel Mormon, MD;  Location: [facility];  Service: Cardiovascular;  Laterality: N/A;  . ORIF ANKLE FRACTURE Left 03/23/2016   Procedure: OPEN REDUCTION INTERNAL FIXATION (ORIF) ANKLE FRACTURE MALLEOLUS  MALUNION WITH TIBIAL AND FIBULAR OSTEOTOMY AND AUTOGRAFT;  Surgeon: Wylene Simmer, MD;  Location: [facility];  Service: Orthopedics;  Laterality: Left;  . SHOULDER ARTHROSCOPY W/ ROTATOR CUFF REPAIR Left    Social History   Tobacco Use  . Smoking status: Former Smoker    Packs/day: 0.25    Years: 2.00    Pack years: 0.50    Types: Cigarettes    Quit date: 01/17/1987    Years since quitting: 33.8  . Smokeless tobacco: Never Used  Substance Use Topics  . Alcohol use: Yes    Comment: occ   Marital Status: Married   ROS  Review of Systems  Constitutional: Positive for malaise/fatigue and weight gain. Negative for decreased appetite.  Eyes: Positive for visual disturbance (chronic, diabetic macular edema).  Cardiovascular: Positive for dyspnea on exertion (mild), leg swelling (takes PRN lasix) and palpitations (improved). Negative for chest pain, claudication, orthopnea and syncope.  Skin: Negative for nail changes.  Musculoskeletal: Positive for back pain.  Gastrointestinal: Negative for abdominal pain and change in bowel habit.  Neurological: Positive for dizziness. Negative for difficulty  with concentration.  Psychiatric/Behavioral: Negative for altered mental status and suicidal ideas.  All other systems reviewed and are negative.  Objective  Blood pressure (!) 167/81, pulse 75, temperature 98.1 F (36.7 C), temperature source Temporal, resp. rate 17, height 5\' 3"  (1.6 m), weight 208 lb (94.3 kg), SpO2 100 %. Body mass index is 36.85 kg/m.   Vitals with BMI 11/15/2020 09/29/2020 09/29/2020   Height 5\' 3"  - -  Weight 208 lbs 214 lbs 214 lbs  BMI 36.85 18.84 16.60  Systolic 630 160 109  Diastolic 81 84 92  Pulse 75 - 54     Physical Exam Vitals reviewed.  Constitutional:      Appearance: She is well-developed.     Comments: Mildly obese  HENT:     Head: Normocephalic and atraumatic.  Cardiovascular:     Rate and Rhythm: Normal rate and regular rhythm.     Pulses: Intact distal pulses.          Femoral pulses are 2+ on the right side and 2+ on the left side.      Popliteal pulses are 2+ on the right side and 2+ on the left side.       Dorsalis pedis pulses are 2+ on the right side and 2+ on the left side.       Posterior tibial pulses are 2+ on the right side and 2+ on the left side.     Heart sounds: Normal heart sounds.  Pulmonary:     Effort: Pulmonary effort is normal. No accessory muscle usage or respiratory distress.     Breath sounds: Normal breath sounds.  Musculoskeletal:        General: Normal range of motion.     Cervical back: Normal range of motion.     Right lower leg: Edema (trace) present.     Left lower leg: Edema (trace) present.  Skin:    General: Skin is warm and dry.  Neurological:     Mental Status: She is alert and oriented to person, place, and time.  Psychiatric:        Mood and Affect: Mood normal.        Behavior: Behavior normal.     Laboratory examination:   Lipid Panel     Component Value Date/Time   CHOL 164 10/08/2019 0901   TRIG 52 10/08/2019 0901   HDL 99 10/08/2019 0901   LDLCALC 54 10/08/2019 0901   LDLDIRECT 61 10/08/2019 0901    External labs:   Labs 03/03/2020: Total cholesterol 139, triglycerides 80, HDL 55, LDL 68.  A1c  7.2%    02/27/2020 TSH 1.700 04/05/2020  Hemoglobin 11.600 G/ 05/20/2020 Platelets 251.000 X1 05/20/2020  Creatinine, Serum 1.370 MG/ 05/20/2020 Potassium 5.400 mm 04/05/2020 ALT (SGPT) 11.000 IU/ 04/05/2020  WBC 5.2 10/07/2019  HGB 12.2 10/07/2019  HCT 36.8 10/07/2019  PLT 165  10/07/2019   NA 136 10/07/2019  K 4.6 10/07/2019  CL 106 10/07/2019  CO2 23 10/07/2019  BUN 24 (H) 10/07/2019  CREATININE 1.3 (H) 10/07/2019  GLUCOSE 151 (H) 10/07/2019  CALCIUM 8.8 10/07/2019  GFR 46 10/07/2019   Lab Results  Component Value Date  AST 22 10/07/2019  ALT 14 10/07/2019  TBILI 1.1 10/07/2019  ALKPHOS 139 (H) 10/07/2019  ALB 3.7 10/07/2019   Medications and Allergies   Allergies  Allergen Reactions  . Other Other (See Comments)  . Succinylcholine Other (See Comments)    Pseudocholinesterase deficiency    Current Outpatient Medications on File Prior  to Visit  Medication Sig Dispense Refill  . albuterol (PROVENTIL HFA;VENTOLIN HFA) 108 (90 Base) MCG/ACT inhaler Inhale 1-2 puffs into the lungs every 6 (six) hours as needed for wheezing or shortness of breath.    Marland Kitchen amLODipine (NORVASC) 5 MG tablet Take 1 tablet (5 mg total) by mouth every evening. 30 tablet 2  . Cholecalciferol (VITAMIN D) 2000 units tablet Take 2,000 Units by mouth daily.    . ferrous sulfate 325 (65 FE) MG tablet Take 325 mg by mouth daily with breakfast.    . furosemide (LASIX) 40 MG tablet Take 40 mg by mouth as needed for edema.   2  . insulin lispro (HUMALOG) 100 UNIT/ML injection Inject into the skin See admin instructions. Insulin Pump - Mini Medtronic 670G    . lisinopril (ZESTRIL) 5 MG tablet Take 1 tablet by mouth daily.    Marland Kitchen loratadine (CLARITIN) 10 MG tablet Take 1 tablet by mouth daily as needed.    . metoprolol succinate (TOPROL-XL) 50 MG 24 hr tablet TAKE 1 TABLET BY MOUTH  DAILY 90 tablet 3  . rosuvastatin (CRESTOR) 10 MG tablet Take 10 mg by mouth at bedtime.    Marland Kitchen tobramycin (TOBREX) 0.3 % ophthalmic solution Place 2 drops into both eyes as needed (prior to injections).      No current facility-administered medications on file prior to visit.     Radiology:  No results found.   Cardiac Studies:   Coronary angiogram 05/08/2017: Ost 1st Diag to 1st Diag lesion, 80%stenosed.  Prox LAD lesion, 40% stenosed. Ost 2nd Diag lesion, 60% stenosed.  Nuclear stress test  [03/26/2017]: 1. Patient attempted treadmill stress, terminated after 2 minutes due to dyspnea. Converted to pharmacologic stress. The resting electrocardiogram demonstrated normal sinus rhythm, normal resting conduction, no resting arrhythmias and normal rest repolarization. Stress EKG is non-diagnostic for ischemia as it a pharmacologic stress using Lexiscan. Stress symptoms included dyspnea. 2. The LV is dilated both at rest and stress images. The LV end diastolic volume was 401UU. There is a moderate area of ischemia in the basal anterior, basal anteroseptal, mid anterior, mid anteroseptal and apical anterior myocardial wall(s). Overall left ventricular systolic function was abnormal without regional wall motion abnormalities. The left ventricular ejection fraction was calculated or visually estimated to be 41%. This is a high risk study, consider further cardiac work-up  Echocardiogram  [03/20/2017]: Left ventricle cavity is normal in size. Moderate concentric hypertrophy of the left ventricle. Normal global wall motion. Doppler evidence of grade II (pseudonormal) diastolic dysfunction. Diastolic dysfunction suggests elevated LA/LV endiastolic pressure. Calculated EF 61%. Trace tricuspid regurgitation. Unable to estimate PA pressure due to absence/minimal TR signal. IVC is dilated with blunted respiratory response. Suggests elevated central venous pressure.   EKG   EKG 09/29/2020: Marked sinus bradycardia at rate of 54 bpm with first-degree AV block, poor R wave progression, probably normal variant, cannot exclude anteroseptal infarct old.  No evidence of ischemia, normal QT interval.  No significant change compared to 10/16/2018. No significant change from  EKG 06/18/2019   Assessment     ICD-10-CM   1. Essential hypertension  I10 EKG 12-Lead     No orders of the defined types were placed in this  encounter.  Medications Discontinued During This Encounter  Medication Reason  . insulin NPH Human (NOVOLIN N) 100 UNIT/ML injection Error    Recommendations:   Colleen [name]  is a 55 y.o. Caucasian female patient who who is a Software engineer by profession,  with long-standing uncontrolled diabetes mellitus, now controlled with insulin pump, stage III chronic kidney disease with proteinuria and follows Dr. Hollie Salk and also diabetic peripheral neuropathy, hypertension, CAD by coronary angiography in 2018 revealing proximal LAD 40% stenosis, D1 80% stenosis and D2 60% stenosis, on medical therapy.Patient underwent left ankle arthrodesis due to Charcot joint in 2021 and has healed well.  Patient presents for 6-week follow-up of hypertension.  Since the addition of amlodipine patient has had more frequent episodes of symptomatic hypotension including shortness of breath, dizziness, and fatigue.  Her blood pressure is elevated in the office today, however she brings with her blood pressure monitor with a log of readings indicating her blood pressures are well controlled average.  In regard to more frequent episodes of hypotension shared decision with the patient was for her to check her blood pressure prior to taking evening amlodipine and to hold amlodipine if systolic blood pressure is <100 mmHg.  We will continue as needed Lasix for leg edema.  Suspect orthostatic hypotension may be related to diabetes mediated autonomic insufficiency.   Lipid control and renal function has remained stable.  She is on minimal dose of lisinopril due to hyperkalemia.  In regard to weight management, congratulated patient on 6 pound weight loss since January.  Encouraged her to continue with diet and lifestyle modifications for weight loss and to reduce cardiovascular risk.  Notably patient has high risk features with regard to vascular disease, just diabetic macular degeneration/macular edema, peripheral neuropathy, and  underlying coronary artery disease.  Counseled patient regarding the importance of placing a control and reducing cardiovascular risk factors.  Follow-up in 3 months, sooner if needed, for hypertension, orthostatic hypotension, and coronary artery disease.   Alethia Berthold, PA-C 11/15/2020, 2:53 PM Office: [phone]

## 2020-12-19 ENCOUNTER — Inpatient Hospital Stay (HOSPITAL_COMMUNITY): Payer: 59

## 2020-12-19 ENCOUNTER — Emergency Department (HOSPITAL_COMMUNITY): Payer: 59

## 2020-12-19 ENCOUNTER — Encounter (HOSPITAL_COMMUNITY)
Admission: EM | Disposition: A | Discharge status: Home / Self Care | Payer: Self-pay | Source: Home / Self Care | Attending: Cardiology

## 2020-12-19 ENCOUNTER — Inpatient Hospital Stay (HOSPITAL_COMMUNITY)
Admission: EM | Admit: 2020-12-19 | Discharge: 2020-12-30 | DRG: 260 | Disposition: A | Discharge status: Home / Self Care | Payer: 59 | Attending: Cardiology | Admitting: Cardiology

## 2020-12-19 ENCOUNTER — Other Ambulatory Visit: Payer: Self-pay

## 2020-12-19 ENCOUNTER — Encounter (HOSPITAL_COMMUNITY): Payer: Self-pay | Admitting: Emergency Medicine

## 2020-12-19 DIAGNOSIS — Z794 Long term (current) use of insulin: Secondary | ICD-10-CM

## 2020-12-19 DIAGNOSIS — E10311 Type 1 diabetes mellitus with unspecified diabetic retinopathy with macular edema: Secondary | ICD-10-CM | POA: Diagnosis present

## 2020-12-19 DIAGNOSIS — E1022 Type 1 diabetes mellitus with diabetic chronic kidney disease: Secondary | ICD-10-CM | POA: Diagnosis present

## 2020-12-19 DIAGNOSIS — Z9641 Presence of insulin pump (external) (internal): Secondary | ICD-10-CM | POA: Diagnosis present

## 2020-12-19 DIAGNOSIS — R7989 Other specified abnormal findings of blood chemistry: Secondary | ICD-10-CM | POA: Diagnosis present

## 2020-12-19 DIAGNOSIS — R509 Fever, unspecified: Secondary | ICD-10-CM

## 2020-12-19 DIAGNOSIS — E1010 Type 1 diabetes mellitus with ketoacidosis without coma: Secondary | ICD-10-CM | POA: Diagnosis not present

## 2020-12-19 DIAGNOSIS — Z452 Encounter for adjustment and management of vascular access device: Secondary | ICD-10-CM

## 2020-12-19 DIAGNOSIS — I25119 Atherosclerotic heart disease of native coronary artery with unspecified angina pectoris: Secondary | ICD-10-CM | POA: Diagnosis present

## 2020-12-19 DIAGNOSIS — I4430 Unspecified atrioventricular block: Secondary | ICD-10-CM

## 2020-12-19 DIAGNOSIS — K7200 Acute and subacute hepatic failure without coma: Secondary | ICD-10-CM

## 2020-12-19 DIAGNOSIS — I2111 ST elevation (STEMI) myocardial infarction involving right coronary artery: Secondary | ICD-10-CM

## 2020-12-19 DIAGNOSIS — R55 Syncope and collapse: Secondary | ICD-10-CM

## 2020-12-19 DIAGNOSIS — E1043 Type 1 diabetes mellitus with diabetic autonomic (poly)neuropathy: Secondary | ICD-10-CM | POA: Diagnosis not present

## 2020-12-19 DIAGNOSIS — K761 Chronic passive congestion of liver: Secondary | ICD-10-CM | POA: Diagnosis present

## 2020-12-19 DIAGNOSIS — E10649 Type 1 diabetes mellitus with hypoglycemia without coma: Secondary | ICD-10-CM | POA: Diagnosis not present

## 2020-12-19 DIAGNOSIS — I4892 Unspecified atrial flutter: Secondary | ICD-10-CM | POA: Diagnosis not present

## 2020-12-19 DIAGNOSIS — E109 Type 1 diabetes mellitus without complications: Secondary | ICD-10-CM

## 2020-12-19 DIAGNOSIS — E860 Dehydration: Secondary | ICD-10-CM | POA: Diagnosis present

## 2020-12-19 DIAGNOSIS — Z8249 Family history of ischemic heart disease and other diseases of the circulatory system: Secondary | ICD-10-CM

## 2020-12-19 DIAGNOSIS — Z888 Allergy status to other drugs, medicaments and biological substances status: Secondary | ICD-10-CM

## 2020-12-19 DIAGNOSIS — J45909 Unspecified asthma, uncomplicated: Secondary | ICD-10-CM | POA: Diagnosis present

## 2020-12-19 DIAGNOSIS — I2119 ST elevation (STEMI) myocardial infarction involving other coronary artery of inferior wall: Principal | ICD-10-CM | POA: Diagnosis present

## 2020-12-19 DIAGNOSIS — I480 Paroxysmal atrial fibrillation: Secondary | ICD-10-CM | POA: Diagnosis not present

## 2020-12-19 DIAGNOSIS — E10610 Type 1 diabetes mellitus with diabetic neuropathic arthropathy: Secondary | ICD-10-CM | POA: Diagnosis present

## 2020-12-19 DIAGNOSIS — R079 Chest pain, unspecified: Secondary | ICD-10-CM

## 2020-12-19 DIAGNOSIS — R7401 Elevation of levels of liver transaminase levels: Secondary | ICD-10-CM

## 2020-12-19 DIAGNOSIS — K3184 Gastroparesis: Secondary | ICD-10-CM | POA: Diagnosis present

## 2020-12-19 DIAGNOSIS — N179 Acute kidney failure, unspecified: Secondary | ICD-10-CM | POA: Diagnosis not present

## 2020-12-19 DIAGNOSIS — I214 Non-ST elevation (NSTEMI) myocardial infarction: Secondary | ICD-10-CM | POA: Diagnosis not present

## 2020-12-19 DIAGNOSIS — N1832 Chronic kidney disease, stage 3b: Secondary | ICD-10-CM | POA: Diagnosis not present

## 2020-12-19 DIAGNOSIS — T859XXA Unspecified complication of internal prosthetic device, implant and graft, initial encounter: Secondary | ICD-10-CM

## 2020-12-19 DIAGNOSIS — I442 Atrioventricular block, complete: Secondary | ICD-10-CM | POA: Diagnosis not present

## 2020-12-19 DIAGNOSIS — Z20822 Contact with and (suspected) exposure to covid-19: Secondary | ICD-10-CM | POA: Diagnosis not present

## 2020-12-19 DIAGNOSIS — E875 Hyperkalemia: Secondary | ICD-10-CM | POA: Diagnosis not present

## 2020-12-19 DIAGNOSIS — Z950 Presence of cardiac pacemaker: Secondary | ICD-10-CM | POA: Diagnosis not present

## 2020-12-19 DIAGNOSIS — I50810 Right heart failure, unspecified: Secondary | ICD-10-CM | POA: Diagnosis not present

## 2020-12-19 DIAGNOSIS — R570 Cardiogenic shock: Secondary | ICD-10-CM | POA: Diagnosis present

## 2020-12-19 DIAGNOSIS — I130 Hypertensive heart and chronic kidney disease with heart failure and stage 1 through stage 4 chronic kidney disease, or unspecified chronic kidney disease: Secondary | ICD-10-CM | POA: Diagnosis not present

## 2020-12-19 DIAGNOSIS — R112 Nausea with vomiting, unspecified: Secondary | ICD-10-CM

## 2020-12-19 DIAGNOSIS — G9341 Metabolic encephalopathy: Secondary | ICD-10-CM | POA: Diagnosis not present

## 2020-12-19 DIAGNOSIS — Z87891 Personal history of nicotine dependence: Secondary | ICD-10-CM

## 2020-12-19 DIAGNOSIS — K219 Gastro-esophageal reflux disease without esophagitis: Secondary | ICD-10-CM | POA: Diagnosis present

## 2020-12-19 DIAGNOSIS — Z79899 Other long term (current) drug therapy: Secondary | ICD-10-CM

## 2020-12-19 HISTORY — PX: TEMPORARY PACEMAKER: CATH118268

## 2020-12-19 LAB — COMPREHENSIVE METABOLIC PANEL
ALT: 1814 U/L — ABNORMAL HIGH (ref 0–44)
ALT: 2839 U/L — ABNORMAL HIGH (ref 0–44)
AST: 3285 U/L — ABNORMAL HIGH (ref 15–41)
AST: 5434 U/L — ABNORMAL HIGH (ref 15–41)
Albumin: 4 g/dL (ref 3.5–5.0)
Albumin: 3.5 g/dL (ref 3.5–5.0)
Alkaline Phosphatase: 139 U/L — ABNORMAL HIGH (ref 38–126)
Alkaline Phosphatase: 124 U/L (ref 38–126)
Anion gap: 19 — ABNORMAL HIGH (ref 5–15)
Anion gap: 17 — ABNORMAL HIGH (ref 5–15)
BUN: 69 mg/dL — ABNORMAL HIGH (ref 6–20)
BUN: 78 mg/dL — ABNORMAL HIGH (ref 6–20)
CO2: 17 mmol/L — ABNORMAL LOW (ref 22–32)
CO2: 15 mmol/L — ABNORMAL LOW (ref 22–32)
Calcium: 9.4 mg/dL (ref 8.9–10.3)
Calcium: 8.3 mg/dL — ABNORMAL LOW (ref 8.9–10.3)
Chloride: 101 mmol/L (ref 98–111)
Chloride: 101 mmol/L (ref 98–111)
Creatinine, Ser: 4.34 mg/dL — ABNORMAL HIGH (ref 0.44–1.00)
Creatinine, Ser: 4.94 mg/dL — ABNORMAL HIGH (ref 0.44–1.00)
GFR, Estimated: 11 mL/min — ABNORMAL LOW (ref >60)
GFR, Estimated: 10 mL/min — ABNORMAL LOW (ref >60)
Glucose, Bld: 264 mg/dL — ABNORMAL HIGH (ref 70–99)
Glucose, Bld: 318 mg/dL — ABNORMAL HIGH (ref 70–99)
Potassium: 5.4 mmol/L — ABNORMAL HIGH (ref 3.5–5.1)
Potassium: 5.3 mmol/L — ABNORMAL HIGH (ref 3.5–5.1)
Sodium: 137 mmol/L (ref 135–145)
Sodium: 133 mmol/L — ABNORMAL LOW (ref 135–145)
Total Bilirubin: 2.4 mg/dL — ABNORMAL HIGH (ref 0.3–1.2)
Total Bilirubin: 2.5 mg/dL — ABNORMAL HIGH (ref 0.3–1.2)
Total Protein: 7 g/dL (ref 6.5–8.1)
Total Protein: 6 g/dL — ABNORMAL LOW (ref 6.5–8.1)

## 2020-12-19 LAB — CREATININE, URINE, RANDOM: Creatinine, Urine: 230.8 mg/dL

## 2020-12-19 LAB — CBC
HCT: 38.5 % (ref 36.0–46.0)
Hemoglobin: 12.3 g/dL (ref 12.0–15.0)
MCH: 29.9 pg (ref 26.0–34.0)
MCHC: 31.9 g/dL (ref 30.0–36.0)
MCV: 93.7 fL (ref 80.0–100.0)
Platelets: 173 10*3/uL (ref 150–400)
RBC: 4.11 MIL/uL (ref 3.87–5.11)
RDW: 13.2 % (ref 11.5–15.5)
WBC: 11.7 10*3/uL — ABNORMAL HIGH (ref 4.0–10.5)
nRBC: 0 % (ref 0.0–0.2)

## 2020-12-19 LAB — HEMOGLOBIN A1C
Hgb A1c MFr Bld: 7.8 % — ABNORMAL HIGH (ref 4.8–5.6)
Mean Plasma Glucose: 177.16 mg/dL

## 2020-12-19 LAB — POCT I-STAT 7, (LYTES, BLD GAS, ICA,H+H)
Acid-base deficit: 14 mmol/L — ABNORMAL HIGH (ref 0.0–2.0)
Bicarbonate: 11.2 mmol/L — ABNORMAL LOW (ref 20.0–28.0)
Calcium, Ion: 0.89 mmol/L — CL (ref 1.15–1.40)
HCT: 28 % — ABNORMAL LOW (ref 36.0–46.0)
Hemoglobin: 9.5 g/dL — ABNORMAL LOW (ref 12.0–15.0)
O2 Saturation: 98 %
Potassium: 4.8 mmol/L (ref 3.5–5.1)
Sodium: 136 mmol/L (ref 135–145)
TCO2: 12 mmol/L — ABNORMAL LOW (ref 22–32)
pCO2 arterial: 22.4 mmHg — ABNORMAL LOW (ref 32.0–48.0)
pH, Arterial: 7.306 — ABNORMAL LOW (ref 7.350–7.450)
pO2, Arterial: 119 mmHg — ABNORMAL HIGH (ref 83.0–108.0)

## 2020-12-19 LAB — BASIC METABOLIC PANEL
Anion gap: 13 (ref 5–15)
BUN: 81 mg/dL — ABNORMAL HIGH (ref 6–20)
CO2: 13 mmol/L — ABNORMAL LOW (ref 22–32)
Calcium: 7.7 mg/dL — ABNORMAL LOW (ref 8.9–10.3)
Chloride: 104 mmol/L (ref 98–111)
Creatinine, Ser: 4.77 mg/dL — ABNORMAL HIGH (ref 0.44–1.00)
GFR, Estimated: 10 mL/min — ABNORMAL LOW (ref >60)
Glucose, Bld: 307 mg/dL — ABNORMAL HIGH (ref 70–99)
Potassium: 5.9 mmol/L — ABNORMAL HIGH (ref 3.5–5.1)
Sodium: 130 mmol/L — ABNORMAL LOW (ref 135–145)

## 2020-12-19 LAB — I-STAT CHEM 8, ED
BUN: 67 mg/dL — ABNORMAL HIGH (ref 6–20)
Calcium, Ion: 1.09 mmol/L — ABNORMAL LOW (ref 1.15–1.40)
Chloride: 102 mmol/L (ref 98–111)
Creatinine, Ser: 4.7 mg/dL — ABNORMAL HIGH (ref 0.44–1.00)
Glucose, Bld: 264 mg/dL — ABNORMAL HIGH (ref 70–99)
HCT: 36 % (ref 36.0–46.0)
Hemoglobin: 12.2 g/dL (ref 12.0–15.0)
Potassium: 5.3 mmol/L — ABNORMAL HIGH (ref 3.5–5.1)
Sodium: 135 mmol/L (ref 135–145)
TCO2: 20 mmol/L — ABNORMAL LOW (ref 22–32)

## 2020-12-19 LAB — HIV ANTIBODY (ROUTINE TESTING W REFLEX): HIV Screen 4th Generation wRfx: NONREACTIVE

## 2020-12-19 LAB — PROTIME-INR
INR: 2.3 — ABNORMAL HIGH (ref 0.8–1.2)
Prothrombin Time: 24.7 seconds — ABNORMAL HIGH (ref 11.4–15.2)

## 2020-12-19 LAB — URINALYSIS, ROUTINE W REFLEX MICROSCOPIC
Bilirubin Urine: NEGATIVE
Glucose, UA: 50 mg/dL — AB
Hgb urine dipstick: NEGATIVE
Ketones, ur: 5 mg/dL — AB
Leukocytes,Ua: NEGATIVE
Nitrite: NEGATIVE
Protein, ur: 30 mg/dL — AB
Specific Gravity, Urine: 1.017 (ref 1.005–1.030)
pH: 5 (ref 5.0–8.0)

## 2020-12-19 LAB — GLUCOSE, CAPILLARY
Glucose-Capillary: 286 mg/dL — ABNORMAL HIGH (ref 70–99)
Glucose-Capillary: 287 mg/dL — ABNORMAL HIGH (ref 70–99)
Glucose-Capillary: 299 mg/dL — ABNORMAL HIGH (ref 70–99)

## 2020-12-19 LAB — BLOOD GAS, VENOUS
Acid-base deficit: 9.1 mmol/L — ABNORMAL HIGH (ref 0.0–2.0)
Bicarbonate: 16.8 mmol/L — ABNORMAL LOW (ref 20.0–28.0)
Drawn by: 164
O2 Saturation: 47.5 %
Patient temperature: 37
pCO2, Ven: 40 mmHg — ABNORMAL LOW (ref 44.0–60.0)
pH, Ven: 7.247 — ABNORMAL LOW (ref 7.250–7.430)
pO2, Ven: 34.6 mmHg (ref 32.0–45.0)

## 2020-12-19 LAB — POCT I-STAT, CHEM 8
BUN: 93 mg/dL — ABNORMAL HIGH (ref 6–20)
Calcium, Ion: 1.01 mmol/L — ABNORMAL LOW (ref 1.15–1.40)
Chloride: 103 mmol/L (ref 98–111)
Creatinine, Ser: 5 mg/dL — ABNORMAL HIGH (ref 0.44–1.00)
Glucose, Bld: 318 mg/dL — ABNORMAL HIGH (ref 70–99)
HCT: 35 % — ABNORMAL LOW (ref 36.0–46.0)
Hemoglobin: 11.9 g/dL — ABNORMAL LOW (ref 12.0–15.0)
Potassium: 6.1 mmol/L — ABNORMAL HIGH (ref 3.5–5.1)
Sodium: 131 mmol/L — ABNORMAL LOW (ref 135–145)
TCO2: 15 mmol/L — ABNORMAL LOW (ref 22–32)

## 2020-12-19 LAB — RESP PANEL BY RT-PCR (FLU A&B, COVID) ARPGX2
Influenza A by PCR: NEGATIVE
Influenza B by PCR: NEGATIVE
SARS Coronavirus 2 by RT PCR: NEGATIVE

## 2020-12-19 LAB — I-STAT BETA HCG BLOOD, ED (MC, WL, AP ONLY): I-stat hCG, quantitative: <5 m[IU]/mL (ref <5)

## 2020-12-19 LAB — ECHOCARDIOGRAM COMPLETE
Height: 63 in
S' Lateral: 3.5 cm
Weight: 3280 oz

## 2020-12-19 LAB — HEPARIN LEVEL (UNFRACTIONATED): Heparin Unfractionated: 0.1 IU/mL — ABNORMAL LOW (ref 0.30–0.70)

## 2020-12-19 LAB — SODIUM, URINE, RANDOM: Sodium, Ur: 12 mmol/L

## 2020-12-19 LAB — LIPASE, BLOOD: Lipase: 20 U/L (ref 11–51)

## 2020-12-19 LAB — APTT: aPTT: 31 seconds (ref 24–36)

## 2020-12-19 LAB — TROPONIN I (HIGH SENSITIVITY)
Troponin I (High Sensitivity): 75829 ng/L (ref <18)
Troponin I (High Sensitivity): >27000 ng/L (ref <18)

## 2020-12-19 SURGERY — TEMPORARY PACEMAKER
Anesthesia: LOCAL

## 2020-12-19 MED ORDER — NITROGLYCERIN 0.4 MG SL SUBL: 0.4000 mg | SUBLINGUAL_TABLET | SUBLINGUAL | Status: DC | PRN

## 2020-12-19 MED ORDER — SODIUM CHLORIDE 0.9 % IV SOLN
0.7500 ug/kg/min | INTRAVENOUS | Status: DC
  Administered 2020-12-19 – 2020-12-20 (×2): 0.75 ug/kg/min via INTRAVENOUS
  Filled 2020-12-19 (×3): qty 50

## 2020-12-19 MED ORDER — PERFLUTREN LIPID MICROSPHERE
1.0000 mL | INTRAVENOUS | Status: AC | PRN
  Administered 2020-12-19: 3 mL via INTRAVENOUS
  Filled 2020-12-19: qty 10

## 2020-12-19 MED ORDER — ASPIRIN 300 MG RE SUPP: 300.0000 mg | RECTAL | Status: AC

## 2020-12-19 MED ORDER — LIDOCAINE HCL (PF) 1 % IJ SOLN
INTRAMUSCULAR | Status: AC
  Filled 2020-12-19: qty 30

## 2020-12-19 MED ORDER — LACTATED RINGERS IV SOLN: INTRAVENOUS | Status: DC

## 2020-12-19 MED ORDER — FENTANYL CITRATE (PF) 100 MCG/2ML IJ SOLN
INTRAMUSCULAR | Status: AC
  Filled 2020-12-19: qty 2

## 2020-12-19 MED ORDER — DOPAMINE-DEXTROSE 3.2-5 MG/ML-% IV SOLN
0.0000 ug/kg/min | INTRAVENOUS | Status: DC
  Administered 2020-12-19: 2.5 ug/kg/min via INTRAVENOUS

## 2020-12-19 MED ORDER — DEXTROSE IN LACTATED RINGERS 5 % IV SOLN: INTRAVENOUS | Status: DC

## 2020-12-19 MED ORDER — INSULIN ASPART 100 UNIT/ML ~~LOC~~ SOLN
0.0000 [IU] | Freq: Three times a day (TID) | SUBCUTANEOUS | Status: DC
  Administered 2020-12-19: 5 [IU] via SUBCUTANEOUS

## 2020-12-19 MED ORDER — CHLORHEXIDINE GLUCONATE CLOTH 2 % EX PADS
6.0000 | MEDICATED_PAD | Freq: Every day | CUTANEOUS | Status: DC
  Administered 2020-12-20 – 2020-12-29 (×9): 6 via TOPICAL

## 2020-12-19 MED ORDER — ASPIRIN 81 MG PO CHEW
324.0000 mg | CHEWABLE_TABLET | ORAL | Status: AC
  Administered 2020-12-19: 324 mg via ORAL
  Filled 2020-12-19: qty 4

## 2020-12-19 MED ORDER — ACETAMINOPHEN 325 MG PO TABS: 650.0000 mg | ORAL_TABLET | ORAL | Status: DC | PRN

## 2020-12-19 MED ORDER — FENTANYL CITRATE (PF) 100 MCG/2ML IJ SOLN
25.0000 ug | Freq: Once | INTRAMUSCULAR | Status: AC
  Administered 2020-12-19: 25 ug via INTRAVENOUS
  Filled 2020-12-19: qty 2

## 2020-12-19 MED ORDER — LIDOCAINE HCL (PF) 1 % IJ SOLN
20.0000 mL | Freq: Once | INTRAMUSCULAR | Status: AC
  Administered 2020-12-19: 20 mL

## 2020-12-19 MED ORDER — ASPIRIN EC 81 MG PO TBEC
81.0000 mg | DELAYED_RELEASE_TABLET | Freq: Every day | ORAL | Status: DC
  Administered 2020-12-20 – 2020-12-30 (×11): 81 mg via ORAL
  Filled 2020-12-19 (×11): qty 1

## 2020-12-19 MED ORDER — HEPARIN (PORCINE) IN NACL 1000-0.9 UT/500ML-% IV SOLN
INTRAVENOUS | Status: AC
  Filled 2020-12-19: qty 500

## 2020-12-19 MED ORDER — ONDANSETRON HCL 4 MG/2ML IJ SOLN
4.0000 mg | Freq: Once | INTRAMUSCULAR | Status: AC
  Administered 2020-12-19: 4 mg via INTRAVENOUS
  Filled 2020-12-19: qty 2

## 2020-12-19 MED ORDER — SODIUM CHLORIDE 0.9 % IV SOLN: INTRAVENOUS | Status: DC

## 2020-12-19 MED ORDER — SODIUM CHLORIDE 0.9 % IV BOLUS: 1000.0000 mL | Freq: Once | INTRAVENOUS | Status: DC

## 2020-12-19 MED ORDER — INSULIN REGULAR(HUMAN) IN NACL 100-0.9 UT/100ML-% IV SOLN
INTRAVENOUS | Status: AC
  Administered 2020-12-20: 3.8 [IU]/h via INTRAVENOUS
  Administered 2020-12-20: 7.5 [IU]/h via INTRAVENOUS
  Filled 2020-12-19 (×2): qty 100

## 2020-12-19 MED ORDER — HEPARIN (PORCINE) IN NACL 1000-0.9 UT/500ML-% IV SOLN
INTRAVENOUS | Status: DC | PRN
  Administered 2020-12-19: 500 mL

## 2020-12-19 MED ORDER — FENTANYL CITRATE (PF) 100 MCG/2ML IJ SOLN
INTRAMUSCULAR | Status: DC | PRN
  Administered 2020-12-19: 25 ug via INTRAVENOUS

## 2020-12-19 MED ORDER — FENTANYL CITRATE (PF) 100 MCG/2ML IJ SOLN
25.0000 ug | Freq: Once | INTRAMUSCULAR | Status: AC
  Administered 2020-12-19: 25 ug via INTRAVENOUS

## 2020-12-19 MED ORDER — ONDANSETRON HCL 4 MG/2ML IJ SOLN
INTRAMUSCULAR | Status: AC
  Filled 2020-12-19: qty 2

## 2020-12-19 MED ORDER — SODIUM CHLORIDE 0.9 % IV SOLN
INTRAVENOUS | Status: AC | PRN
  Administered 2020-12-19: 10 mL/h via INTRAVENOUS

## 2020-12-19 MED ORDER — SODIUM CHLORIDE 0.9 % IV SOLN
INTRAVENOUS | Status: AC | PRN
  Administered 2020-12-19: 25 mg via INTRAVENOUS

## 2020-12-19 MED ORDER — SODIUM CHLORIDE 0.9 % IV BOLUS
750.0000 mL | Freq: Once | INTRAVENOUS | Status: AC
  Administered 2020-12-19: 750 mL via INTRAVENOUS

## 2020-12-19 MED ORDER — SODIUM CHLORIDE 0.9 % IV BOLUS
INTRAVENOUS | Status: AC | PRN
  Administered 2020-12-19: 500 mL/h via INTRAVENOUS

## 2020-12-19 MED ORDER — PROMETHAZINE HCL 25 MG/ML IJ SOLN
25.0000 mg | Freq: Once | INTRAMUSCULAR | Status: DC
  Filled 2020-12-19: qty 1

## 2020-12-19 MED ORDER — INSULIN ASPART 100 UNIT/ML ~~LOC~~ SOLN: 0.0000 [IU] | Freq: Every day | SUBCUTANEOUS | Status: DC

## 2020-12-19 MED ORDER — ALUM & MAG HYDROXIDE-SIMETH 200-200-20 MG/5ML PO SUSP
30.0000 mL | Freq: Once | ORAL | Status: AC
  Administered 2020-12-23: 30 mL via ORAL
  Filled 2020-12-19 (×2): qty 30

## 2020-12-19 MED ORDER — ONDANSETRON HCL 4 MG/2ML IJ SOLN
INTRAMUSCULAR | Status: DC | PRN
  Administered 2020-12-19: 4 mg via INTRAVENOUS

## 2020-12-19 MED ORDER — MIDAZOLAM HCL 2 MG/2ML IJ SOLN
INTRAMUSCULAR | Status: DC | PRN
  Administered 2020-12-19: 1 mg via INTRAVENOUS

## 2020-12-19 MED ORDER — SODIUM CHLORIDE 0.9 % IV SOLN
25.0000 mg | Freq: Once | INTRAVENOUS | Status: AC
  Administered 2020-12-19: 25 mg via INTRAVENOUS
  Filled 2020-12-19: qty 1

## 2020-12-19 MED ORDER — ONDANSETRON HCL 4 MG/2ML IJ SOLN
4.0000 mg | Freq: Four times a day (QID) | INTRAMUSCULAR | Status: DC | PRN
  Administered 2020-12-22: 4 mg via INTRAVENOUS
  Filled 2020-12-19: qty 2

## 2020-12-19 MED ORDER — DEXTROSE 50 % IV SOLN
0.0000 mL | INTRAVENOUS | Status: DC | PRN
  Administered 2020-12-24 (×2): 50 mL via INTRAVENOUS
  Filled 2020-12-19 (×2): qty 50

## 2020-12-19 MED ORDER — MIDAZOLAM HCL 2 MG/2ML IJ SOLN
INTRAMUSCULAR | Status: AC
  Filled 2020-12-19: qty 2

## 2020-12-19 MED ORDER — SODIUM CHLORIDE 0.9 % IV BOLUS
1000.0000 mL | Freq: Once | INTRAVENOUS | Status: AC
  Administered 2020-12-19: 1000 mL via INTRAVENOUS

## 2020-12-19 MED ORDER — INSULIN GLARGINE 100 UNIT/ML ~~LOC~~ SOLN
10.0000 [IU] | Freq: Every day | SUBCUTANEOUS | Status: DC
  Administered 2020-12-19: 10 [IU] via SUBCUTANEOUS
  Filled 2020-12-19 (×2): qty 0.1

## 2020-12-19 MED ORDER — FENTANYL CITRATE (PF) 100 MCG/2ML IJ SOLN
25.0000 ug | Freq: Once | INTRAMUSCULAR | Status: DC
  Filled 2020-12-19 (×2): qty 2

## 2020-12-19 MED ORDER — LIDOCAINE HCL (PF) 1 % IJ SOLN
INTRAMUSCULAR | Status: DC | PRN
  Administered 2020-12-19: 2 mL

## 2020-12-19 MED ORDER — LIDOCAINE HCL (PF) 1 % IJ SOLN
INTRAMUSCULAR | Status: DC | PRN
  Administered 2020-12-19: 5 mL
  Administered 2020-12-19: 15 mL

## 2020-12-19 MED ORDER — LIDOCAINE VISCOUS HCL 2 % MT SOLN
15.0000 mL | Freq: Once | OROMUCOSAL | Status: AC
  Administered 2020-12-19: 15 mL via ORAL
  Filled 2020-12-19: qty 15

## 2020-12-19 MED ORDER — HEPARIN (PORCINE) 25000 UT/250ML-% IV SOLN
1650.0000 [IU]/h | INTRAVENOUS | Status: DC
  Administered 2020-12-19: 1050 [IU]/h via INTRAVENOUS
  Administered 2020-12-20: 1400 [IU]/h via INTRAVENOUS
  Administered 2020-12-20: 1250 [IU]/h via INTRAVENOUS
  Administered 2020-12-21 – 2020-12-24 (×5): 1650 [IU]/h via INTRAVENOUS
  Filled 2020-12-19 (×8): qty 250

## 2020-12-19 SURGICAL SUPPLY — 6 items
CATH S G BIP PACING (CATHETERS) ×2 IMPLANT
PACK CARDIAC CATHETERIZATION (CUSTOM PROCEDURE TRAY) ×2 IMPLANT
PINNACLE LONG 6F 25CM (SHEATH) ×2
SHEATH INTRO PINNACLE 6F 25CM (SHEATH) ×1 IMPLANT
SLEEVE REPOSITIONING LENGTH 30 (MISCELLANEOUS) ×2 IMPLANT
WIRE EMERALD 3MM-J .035X150CM (WIRE) ×2 IMPLANT

## 2020-12-19 SURGICAL SUPPLY — 12 items
CABLE ADAPT PACING TEMP 12FT (ADAPTER) ×2 IMPLANT
CATH S G BIP PACING (CATHETERS) ×4 IMPLANT
KIT HEART LEFT (KITS) ×2 IMPLANT
KIT MICROPUNCTURE NIT STIFF (SHEATH) ×2 IMPLANT
PACK CARDIAC CATHETERIZATION (CUSTOM PROCEDURE TRAY) ×2 IMPLANT
PROTECTION STATION PRESSURIZED (MISCELLANEOUS) ×2
SHEATH PINNACLE 6F 10CM (SHEATH) ×4 IMPLANT
SHEATH PROBE COVER 6X72 (BAG) ×2 IMPLANT
SLEEVE REPOSITIONING LENGTH 30 (MISCELLANEOUS) ×2 IMPLANT
STATION PROTECTION PRESSURIZED (MISCELLANEOUS) ×1 IMPLANT
TRANSDUCER W/STOPCOCK (MISCELLANEOUS) ×2 IMPLANT
TUBING CIL FLEX 10 FLL-RA (TUBING) ×2 IMPLANT

## 2020-12-19 NOTE — Consult Note (Addendum)
Renal Service Consult Note [facility]  Colleen [name] 12/19/2020 Sol Blazing, MD Requesting Physician: Dr Virgina Jock  Reason for Consult: Renal failure HPI: The patient is a 55 y.o. year-old w/ hx of T1DM on insulin pump, hx mild CAD presented w/ 3d hx N/V then had syncopal episode. In ED EKG showed inf ST elevation. Cardiology consulted for possible AV block.  Creat 4.0.  Pt went to cath lab for temp pacemaker today. Asked to see for renal failure.   Pt seen in ICU.  Pt is f/u Dr Hollie Salk, per husband she has stage 3 CKD, baseline creat around 1.3.  Gives hx of N/V for 2-3 days. Denies CP. Has neuropathic LE's, per husband most of the L lower leg is "made of metal". No active SOB or cough, no abd pain. No fevers.   ROS  denies CP  no joint pain   no HA  no blurry vision  no rash  no dysuria  no difficulty voiding  no change in urine color    Past Medical History  Past Medical History:  Diagnosis Date  . Asthma   . Complication of anesthesia    Psuedocholinerasterase deficiency per pt  . Diabetes mellitus    TYPE II  . Diabetic macular edema (HCC)    Pt gets injections in eyes with Eyelea  . Seasonal allergies    Past Surgical History  Past Surgical History:  Procedure Laterality Date  . BACK SURGERY  2009  . CATARACT SURGERY     BILATERAL  . CESAREAN SECTION     X2  . KNEE SURGERY Right   . LEFT HEART CATH AND CORONARY ANGIOGRAPHY N/A 05/08/2017   Procedure: Left Heart Cath and Coronary Angiography;  Surgeon: Nigel Mormon, MD;  Location: [facility];  Service: Cardiovascular;  Laterality: N/A;  . ORIF ANKLE FRACTURE Left 03/23/2016   Procedure: OPEN REDUCTION INTERNAL FIXATION (ORIF) ANKLE FRACTURE MALLEOLUS  MALUNION WITH TIBIAL AND FIBULAR OSTEOTOMY AND AUTOGRAFT;  Surgeon: Wylene Simmer, MD;  Location: [facility];  Service: Orthopedics;  Laterality: Left;  . SHOULDER ARTHROSCOPY W/ ROTATOR CUFF REPAIR Left    Family  History  Family History  Problem Relation Age of Onset  . Hypertension Father   . Hypertension Paternal Aunt   . Hypertension Paternal Grandmother   . Hypertension Paternal Grandfather    Social History  reports that she quit smoking about 33 years ago. Her smoking use included cigarettes. She has a 0.50 pack-year smoking history. She has never used smokeless tobacco. She reports current alcohol use. She reports that she does not use drugs. Allergies  Allergies  Allergen Reactions  . Other Other (See Comments)  . Succinylcholine Other (See Comments)    Pseudocholinesterase deficiency   Home medications Prior to Admission medications   Medication Sig Start Date End Date Taking? Authorizing Provider  albuterol (PROVENTIL HFA;VENTOLIN HFA) 108 (90 Base) MCG/ACT inhaler Inhale 1-2 puffs into the lungs every 6 (six) hours as needed for wheezing or shortness of breath.    [provider]  amLODipine (NORVASC) 5 MG tablet Take 1 tablet (5 mg total) by mouth every evening. 09/29/20 12/28/20  Adrian Prows, MD  Cholecalciferol (VITAMIN D) 2000 units tablet Take 2,000 Units by mouth daily.    [provider]  ferrous sulfate 325 (65 FE) MG tablet Take 325 mg by mouth daily with breakfast.    [provider]  furosemide (LASIX) 40 MG tablet Take 40 mg  by mouth as needed for edema.  03/14/17   [provider]  insulin lispro (HUMALOG) 100 UNIT/ML injection Inject into the skin See admin instructions. Insulin Pump - Mini Medtronic 670G    [provider]  lisinopril (ZESTRIL) 5 MG tablet Take 1 tablet by mouth daily. 05/19/19   [provider]  loratadine (CLARITIN) 10 MG tablet Take 1 tablet by mouth daily as needed.    [provider]  metoprolol succinate (TOPROL-XL) 50 MG 24 hr tablet TAKE 1 TABLET BY MOUTH  DAILY 07/08/20   Adrian Prows, MD  rosuvastatin (CRESTOR) 10 MG tablet Take 10 mg by mouth at bedtime. 06/17/20   [provider]   tobramycin (TOBREX) 0.3 % ophthalmic solution Place 2 drops into both eyes as needed (prior to injections).  12/03/18   [provider]     Vitals:   12/19/20 1130 12/19/20 1145 12/19/20 1200 12/19/20 1215  BP: 102/75 107/71 111/67 100/69  Pulse: 60 60 60 60  Resp: $Remo'16 19 11 'sYTwW$ (!) 8  Temp:      TempSrc:      SpO2: 98% 99% 97% 95%  Weight:      Height:       Exam Gen alert, no distress No rash, cyanosis or gangrene Sclera anicteric, throat clear  No jvd Chest clear bilat to bases, no rales/ wheezing RRR no MRG Abd soft obese ntnd no mass or ascites +bs GU normal MS no joint effusions or deformity Ext no LE or UE edema, no wounds or ulcers Neuro is alert, Ox 3 , nf       Home meds:  - crestor 10/ norvasc 5 hs/ lasix 40 prn/ lisinopril 5 qd/ toprol xl 50 qd  - insulin lispro pump  - prn's/ vitamins/ supplements   UA 3/27 - 30 prot, 6-10 rbc/ wbc, 0-5 epi, Hb neg   Renal US -  8.9 / 9.8 cm kidneys w/ no hydronephrosis    CXR - 3/27 - IMPRESSION: There is no evidence of focal airspace disease, pulmonary edema, suspicious pulmonary nodule/mass, pleural effusion, or pneumothorax. No acute bony abnormalities are identified. Lead extending from the abdomen overlying the mid-LEFT heart.      ECHO 3/27 - pend    Na 135 K 5.3  CO2 17 BUN 69 cr 4.34  Alb 4.0  Ca 9.4  AST 3285  ALT 1814  Trop 75,800  Tbili 2.4  Lipase 20  Alk phos 139   WBC 11K  Hb 12.2  plt wnl      INR 2.3   A1C 7.8  Glu 264    Per office records, last creat 1.19 in Dec 2021, diabetic kidney disease.     Assessment/ Plan: 1. AKI on CKD 3 - baseline creat 1.2- 1.4 from dec 2021, CKD stage 3.  Creat here 4.0 in setting of acute MI and several days of nausea/ vomiting.  On exam no vol excess and CXR clear. Renal US w/o obstruction. UA unremarkable. Suspect AKI due to dehydration/ N/V + ACEi effect w/ background of CKD 3.  Recommend cont to hold ACEi, will rebolus 750 cc and start IVF"s 75 cc/hr. Doubt uremia  at this time.  2. Acute MI - trop 75K, per cardiology suspecting RV infarct. Holding off on heart cath for now given AKI. 3. HTN/ vol - BP's normal to slightly high, no vol excess on exam. Suspect on the dry side.  4. Complete AV block - has temp pacemaker placed today  5. DM type 1 - on pump at home      Kelly Splinter  MD 12/19/2020, 2:44 PM  Recent Labs  Lab 12/19/20 0532 12/19/20 0724  WBC 11.7*  --   HGB 12.3 12.2   Recent Labs  Lab 12/19/20 0532 12/19/20 0724  K 5.4* 5.3*  BUN 69* 67*  CREATININE 4.34* 4.70*  CALCIUM 9.4  --

## 2020-12-19 NOTE — H&P (View-Only) (Signed)
I was called re: loss of capture of transvenous pacemaker.   Attempted bedside repositioning under C-arm. It appears that she pacer cover is detached from the 6 Fr sheath, so not sterile anymore. Need to go cath to cath lab to place a new pacemaker. Continue transcutaneous pacing for now.  Patient is awake, BP 90-100/60-70 mmHg, but appears somnolent. Spoke with the husband over the phone and verbal consent obtained.   CRITICAL CARE Performed by: Vernell Leep   Total critical care time: 35 minutes   Critical care time was exclusive of separately billable procedures and treating other patients.   Critical care was necessary to treat or prevent imminent or life-threatening deterioration.   Critical care was time spent personally by me on the following activities: development of treatment plan with patient and/or surrogate as well as nursing, discussions with consultants, evaluation of patient's response to treatment, examination of patient, obtaining history from patient or surrogate, ordering and performing treatments and interventions, ordering and review of laboratory studies, ordering and review of radiographic studies, pulse oximetry and re-evaluation of patient's condition.      Nigel Mormon, MD Pager: [phone] Office: [phone]

## 2020-12-19 NOTE — ED Notes (Signed)
zoll pads applied to patient

## 2020-12-19 NOTE — ED Notes (Addendum)
[facility] here to transfer pt to [facility] Cath Lab

## 2020-12-19 NOTE — ED Notes (Signed)
Pt went to the restroom but was unable to collect a sample in the hat

## 2020-12-19 NOTE — ED Notes (Signed)
ECHO and Cardiology at  Bedside,

## 2020-12-19 NOTE — Progress Notes (Addendum)
ANTICOAGULATION CONSULT NOTE - Initial Consult  Pharmacy Consult for IV heparin Indication: chest pain/ACS  Allergies  Allergen Reactions  . Other Other (See Comments)  . Succinylcholine Other (See Comments)    Pseudocholinesterase deficiency    Patient Measurements: Height: 5\' 3"  (160 cm) Weight: 93 kg (205 lb) IBW/kg (Calculated) : 52.4 Heparin Dosing Weight: 73.7 kg  Vital Signs: Temp: 97.9 F (36.6 C) (03/27 1520) Temp Source: Oral (03/27 1520) BP: 128/80 (03/27 2000) Pulse Rate: 59 (03/27 2000)  Labs: Recent Labs    12/19/20 0532 12/19/20 0715 12/19/20 0724 12/19/20 0739 12/19/20 1154 12/19/20 1825 12/19/20 2010  HGB 12.3  --  12.2  --   --   --   --   HCT 38.5  --  36.0  --   --   --   --   PLT 173  --   --   --   --   --   --   APTT  --   --   --  31  --   --   --   LABPROT  --   --   --  24.7*  --   --   --   INR  --   --   --  2.3*  --   --   --   HEPARINUNFRC  --   --   --   --   --   --  0.10*  CREATININE 4.34*  --  4.70*  --   --  4.94*  --   TROPONINIHS  --  16,109*  --   --  >27,000*  --   --     Estimated Creatinine Clearance: 13.8 mL/min (A) (by C-G formula based on SCr of 4.94 mg/dL (H)).   Medical History: Past Medical History:  Diagnosis Date  . Asthma   . Complication of anesthesia    Psuedocholinerasterase deficiency per pt  . Diabetes mellitus    TYPE II  . Diabetic macular edema (HCC)    Pt gets injections in eyes with Eyelea  . Seasonal allergies     Medications:  Infusions:  . cangrelor 50 mg in NS 250 mL 0.75 mcg/kg/min (12/19/20 2000)  . heparin 1,050 Units/hr (12/19/20 2000)    Assessment: 55 yo female admitted with complete heart block.  Suspicion for ACS, elevated troponins, but cath deferred due to elevated SCr.  Had temporary pacer placed this AM.  Pharmacy asked to dose V heparin.  No anticoagulants noted PTA.  She is noted on cangrelor -heparin level= 0.1  Goal of Therapy:  Heparin level 0.3-0.7  units/ml Monitor platelets by anticoagulation protocol: Yes   Plan:  -Increase heparin to 1250 units/hr -Check heparin level and CBC in am  Hildred Laser, PharmD Clinical Pharmacist **Pharmacist phone directory can now be found on [email or web address] ([email or web address]).  Listed under [place].

## 2020-12-19 NOTE — ED Provider Notes (Signed)
Patient is a 55 year old female who was transferred from [facility] for emergent pacemaker placement after AV block was detected with bradycardia.  She presented with nausea vomiting and diarrhea for the last 3 days.  She had diarrhea but then had a fall which she says was related to her going to the bathroom too fast.  She did hit her head during the fall and has had ongoing vomiting since that time.  She does not have a headache.  However given this, head CT was performed which showed no acute abnormalities.  Her creatinine is elevated and she has some mild hyperkalemia.  Her EKG was noted to be significant for a high-grade AV block.  There are some ST changes.  However, cardiologist is at bedside who performed an ultrasound which does not reveal any significant obvious wall motion abnormalities.  Given this, he is holding off on heparin and will take the patient to the Cath Lab for pacemaker placement.  She was given IV fluid hydration.   Malvin Johns, MD 12/19/20 [phone]

## 2020-12-19 NOTE — ED Provider Notes (Addendum)
[facility] DEPT Provider Note   CSN: [id number] Arrival date & time: 12/19/20  0444     History Chief Complaint  Patient presents with  . Vomiting    Colleen [name] is a 55 y.o. female.  HPI 55 year old female with a history of asthma, DM type II, CKD stage III, GERD, asthma presents to the ER with complaints of vomiting and feeling 6 which began on Friday.  She reports initially vomiting started as food, progressively than continue to have a bile-like appearance, and has now been frothy.  Has had poor p.o. intake.  Denies any fevers or chills.  Reports some epigastric pain which she states that she feels like is reflux.  Denies any flank tenderness, states that she has only urinated once in the last 48 hours.  She has a history of uremic encephalopathy and was admitted to the hospital for this approximately 3 years ago.  Denies any flank tenderness, burning with urination, hematuria.  She does still have her gallbladder.  She reports chronic diarrhea, without blood.      Past Medical History:  Diagnosis Date  . Asthma   . Complication of anesthesia    Psuedocholinerasterase deficiency per pt  . Diabetes mellitus    TYPE II  . Diabetic macular edema (HCC)    Pt gets injections in eyes with Eyelea  . Seasonal allergies     Patient Active Problem List   Diagnosis Date Noted  . Coronary artery disease 10/07/2019  . Essential hypertension 10/07/2019  . Exertional shortness of breath 05/08/2017  . Chronic venous insufficiency 01/19/2017  . Type II diabetes mellitus ([place]) 01/17/2012  . 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 Premature menopause 01/17/2012  . GERD (gastroesophageal reflux disease)   . Asthma     Past Surgical History:  Procedure Laterality Date  . BACK SURGERY  2009  . CATARACT SURGERY     BILATERAL  . CESAREAN SECTION     X2  . KNEE SURGERY Right   . LEFT HEART CATH AND CORONARY ANGIOGRAPHY N/A 05/08/2017    Procedure: Left Heart Cath and Coronary Angiography;  Surgeon: Nigel Mormon, MD;  Location: [facility];  Service: Cardiovascular;  Laterality: N/A;  . ORIF ANKLE FRACTURE Left 03/23/2016   Procedure: OPEN REDUCTION INTERNAL FIXATION (ORIF) ANKLE FRACTURE MALLEOLUS  MALUNION WITH TIBIAL AND FIBULAR OSTEOTOMY AND AUTOGRAFT;  Surgeon: Wylene Simmer, MD;  Location: [facility];  Service: Orthopedics;  Laterality: Left;  . SHOULDER ARTHROSCOPY W/ ROTATOR CUFF REPAIR Left      OB History    Gravida  2   Para  2   Term  2   Preterm      AB      Living  2     SAB      IAB      Ectopic      Multiple      Live Births  2           Family History  Problem Relation Age of Onset  . Hypertension Father   . Hypertension Paternal Aunt   . Hypertension Paternal Grandmother   . Hypertension Paternal Grandfather     Social History   Tobacco Use  . Smoking status: Former Smoker    Packs/day: 0.25    Years: 2.00    Pack years: 0.50    Types: Cigarettes    Quit date: 01/17/1987    Years since quitting: 33.9  .  Smokeless tobacco: Never Used  Vaping Use  . Vaping Use: Never used  Substance Use Topics  . Alcohol use: Yes    Comment: occ  . Drug use: No    Home Medications Prior to Admission medications   Medication Sig Start Date End Date Taking? Authorizing Provider  albuterol (PROVENTIL HFA;VENTOLIN HFA) 108 (90 Base) MCG/ACT inhaler Inhale 1-2 puffs into the lungs every 6 (six) hours as needed for wheezing or shortness of breath.    [provider]  amLODipine (NORVASC) 5 MG tablet Take 1 tablet (5 mg total) by mouth every evening. 09/29/20 12/28/20  Adrian Prows, MD  Cholecalciferol (VITAMIN D) 2000 units tablet Take 2,000 Units by mouth daily.    [provider]  ferrous sulfate 325 (65 FE) MG tablet Take 325 mg by mouth daily with breakfast.    [provider]  furosemide (LASIX) 40 MG tablet Take 40 mg by mouth as needed  for edema.  03/14/17   [provider]  insulin lispro (HUMALOG) 100 UNIT/ML injection Inject into the skin See admin instructions. Insulin Pump - Mini Medtronic 670G    [provider]  lisinopril (ZESTRIL) 5 MG tablet Take 1 tablet by mouth daily. 05/19/19   [provider]  loratadine (CLARITIN) 10 MG tablet Take 1 tablet by mouth daily as needed.    [provider]  metoprolol succinate (TOPROL-XL) 50 MG 24 hr tablet TAKE 1 TABLET BY MOUTH  DAILY 07/08/20   Adrian Prows, MD  rosuvastatin (CRESTOR) 10 MG tablet Take 10 mg by mouth at bedtime. 06/17/20   [provider]  tobramycin (TOBREX) 0.3 % ophthalmic solution Place 2 drops into both eyes as needed (prior to injections).  12/03/18   [provider]    Allergies    Other and Succinylcholine  Review of Systems   Review of Systems  Constitutional: Negative for chills and fever.  HENT: Negative for ear pain and sore throat.   Eyes: Negative for pain and visual disturbance.  Respiratory: Negative for cough and shortness of breath.   Cardiovascular: Negative for chest pain and palpitations.  Gastrointestinal: Positive for abdominal pain, diarrhea, nausea and vomiting. Negative for abdominal distention, blood in stool and constipation.  Genitourinary: Negative for dysuria and hematuria.  Musculoskeletal: Negative for arthralgias and back pain.  Skin: Negative for color change and rash.  Neurological: Negative for seizures and syncope.  All other systems reviewed and are negative.   Physical Exam Updated Vital Signs BP (!) 128/59   Pulse (!) 33   Temp 97.9 F (36.6 C) (Oral)   Resp 17   Ht 5\' 3"  (1.6 m)   Wt 93 kg   SpO2 95%   BMI 36.31 kg/m   Physical Exam Vitals and nursing note reviewed.  Constitutional:      General: She is not in acute distress.    Appearance: Normal appearance. She is well-developed. She is not diaphoretic.     Comments: Uncomfortable appearing, alert  and oriented x3, able to give a clear concise history.  Nondiaphoretic, speaking full sentences  HENT:     Head: Normocephalic and atraumatic.     Right Ear: Tympanic membrane normal.     Nose: Nose normal.     Mouth/Throat:     Mouth: Mucous membranes are moist.     Pharynx: Oropharynx is clear.  Eyes:     Conjunctiva/sclera: Conjunctivae normal.  Cardiovascular:     Rate and Rhythm: Normal rate and regular rhythm.  Heart sounds: No murmur heard.   Pulmonary:     Effort: Pulmonary effort is normal. No respiratory distress.     Breath sounds: Normal breath sounds.  Abdominal:     General: Abdomen is flat.     Palpations: Abdomen is soft.     Tenderness: There is abdominal tenderness.     Comments: Significant epigastric tenderness and guarding, mild right upper quadrant tenderness, negative Murphy's, no tenderness in the lower quadrants, no flank tenderness laterally  Musculoskeletal:     Cervical back: Normal range of motion and neck supple.     Right lower leg: Edema present.     Left lower leg: Edema present.     Comments: Trace edema to bilateral lower extremities  Skin:    General: Skin is warm and dry.     Capillary Refill: Capillary refill takes less than 2 seconds.     Findings: No erythema.  Neurological:     General: No focal deficit present.     Mental Status: She is alert and oriented to person, place, and time.  Psychiatric:        Mood and Affect: Mood normal.        Behavior: Behavior normal.     ED Results / Procedures / Treatments   Labs (all labs ordered are listed, but only abnormal results are displayed) Labs Reviewed  COMPREHENSIVE METABOLIC PANEL - Abnormal; Notable for the following components:      Result Value   Potassium 5.4 (*)    CO2 17 (*)    Glucose, Bld 264 (*)    BUN 69 (*)    Creatinine, Ser 4.34 (*)    ALT 1,814 (*)    Alkaline Phosphatase 139 (*)    Total Bilirubin 2.4 (*)    GFR, Estimated 11 (*)    Anion gap 19 (*)     All other components within normal limits  CBC - Abnormal; Notable for the following components:   WBC 11.7 (*)    All other components within normal limits  URINALYSIS, ROUTINE W REFLEX MICROSCOPIC - Abnormal; Notable for the following components:   Color, Urine AMBER (*)    APPearance CLOUDY (*)    Glucose, UA 50 (*)    Ketones, ur 5 (*)    Protein, ur 30 (*)    Bacteria, UA FEW (*)    All other components within normal limits  PROTIME-INR - Abnormal; Notable for the following components:   Prothrombin Time 24.7 (*)    INR 2.3 (*)    All other components within normal limits  I-STAT CHEM 8, ED - Abnormal; Notable for the following components:   Potassium 5.3 (*)    BUN 67 (*)    Creatinine, Ser 4.70 (*)    Glucose, Bld 264 (*)    Calcium, Ion 1.09 (*)    TCO2 20 (*)    All other components within normal limits  RESP PANEL BY RT-PCR (FLU A&B, COVID) ARPGX2  LIPASE, BLOOD  APTT  BLOOD GAS, VENOUS  I-STAT BETA HCG BLOOD, ED (MC, WL, AP ONLY)  TROPONIN I (HIGH SENSITIVITY)    EKG EKG Interpretation  Date/Time:  Sunday December 19 2020 06:57:44 EDT Ventricular Rate:  34 PR Interval:    QRS Duration: 116 QT Interval:  555 QTC Calculation: 418 R Axis:   65 Text Interpretation: Sinus bradycardia Prolonged PR interval Incomplete right bundle branch block Low voltage, precordial leads ST elevation, consider inferior injury Confirmed by Ripley Fraise [phone])  on 12/19/2020 7:18:59 AM   Radiology No results found.  Procedures .Critical Care Performed by: Garald Balding, PA-C Authorized by: Garald Balding, PA-C   Critical care provider statement:    Critical care time (minutes):  45   Critical care was necessary to treat or prevent imminent or life-threatening deterioration of the following conditions:  Cardiac failure, dehydration, hepatic failure, renal failure and metabolic crisis   Critical care was time spent personally by me on the following activities:  Discussions  with consultants, evaluation of patient's response to treatment, examination of patient, ordering and performing treatments and interventions, ordering and review of laboratory studies, ordering and review of radiographic studies, pulse oximetry, re-evaluation of patient's condition, obtaining history from patient or surrogate and review of old charts     Medications Ordered in ED Medications  alum & mag hydroxide-simeth (MAALOX/MYLANTA) 200-200-20 MG/5ML suspension 30 mL (30 mLs Oral Not Given 12/19/20 0701)    And  lidocaine (XYLOCAINE) 2 % viscous mouth solution 15 mL (15 mLs Oral Given 12/19/20 0701)  sodium chloride 0.9 % bolus 1,000 mL (0 mLs Intravenous Hold 12/19/20 0704)  fentaNYL (SUBLIMAZE) injection 25 mcg (0 mcg Intravenous Hold 12/19/20 0705)  ondansetron (ZOFRAN) injection 4 mg (4 mg Intravenous Given 12/19/20 0553)    ED Course  I have reviewed the triage vital signs and the nursing notes.  Pertinent labs & imaging results that were available during my care of the patient were reviewed by me and considered in my medical decision making (see chart for details).    MDM Rules/Calculators/A&P                         55 year old female here with complaints of vomiting and epigastric pain  On arrival, patient's blood pressure was elevated at 178/99, heart rate at 71, pulse ox at 98%.  However by the time I evaluated her, patient's heart rate was in the mid 30s.  EKG showed questionable ST elevation in leads III and aVF, however there was evidence of a high-grade AV block.  Supervising physician Dr. Christy Gentles discussed the EKG with Dr. Virgina Jock who states that the patient needs to be taken to the Cath Lab given heart block and questionable ST elevation.  [facility] called, heparin ordered, PT/INR pending.    Upon further questioning, patient does endorse a syncopal episode.  She does endorse hitting her head, but has had no headache, nausea, vomiting, blurry vision, not on  anticoagulation.  Low suspicion for intracranial injury.  She is overall well-appearing, with no chest pain or shortness of breath  Labs ordered, reviewed and interpreted by me -CBC with a leukocytosis of 11.4, question infectious etiology versus vomiting -CMP with a potassium of 5.4, glucose of 264 with an anion gap of 19, creatinine markedly elevated from baseline at 4.34 with a GFR of 11.  ALT of 1814, AST pending, but total bilirubin of 2.4. -UA with evidence of ketones, few bacteria, proteinuria. -Lipase normal  MDM: Patient with multiple significant lab abnormalities, as well is an abnormal EKG.  As per Dr. Virgina Jock, patient to be immediately transferred [facility] for Cath.  Patient likely require further work-up given elevated LFTs, total bilirubin.  Also on the differential is possible DKA given elevated glucose of 264, ketones in the urine, elevated anion gap.  However bicarb not suggestive of this at this time.  VBG pending.  Lab findings could also be consistent with dehydration.  Patient given  GI cocktail per her request for reflux.  Stable for transfer to [facility].  This was a shared visit with my supervising physician Dr. Christy Gentles who independently saw and evaluated the patient & provided guidance in evaluation/management/disposition ,in agreement with care   Final Clinical Impression(s) / ED Diagnoses Final diagnoses:  AV block  Syncope and collapse  AKI (acute kidney injury) ([place])  Transaminitis  Nausea and vomiting, intractability of vomiting not specified, unspecified vomiting type    Rx / DC Orders ED Discharge Orders    None           Lyndel Safe 12/19/20 0029    Ripley Fraise, MD 12/20/20 [phone]

## 2020-12-19 NOTE — ED Triage Notes (Signed)
Pt reports that she has been vomiting since Friday. States that she is kidney patient and has been admitted for uremic encephalopathy within the last 3 years. She is not on dialysis. Reports only urinating once in the last 48 hours. Denies fever. States that diarrhea is normal for her so that hasnt changed. Denies pain.

## 2020-12-19 NOTE — Consult Note (Signed)
NAME:  Colleen [name], MRN:  [id number], DOB:  1965-09-30, LOS: 0 ADMISSION DATE:  12/19/2020, CONSULTATION DATE: 12/19/2020 REFERRING MD:  Vernell Leep, CHIEF COMPLAINT: Generalized weakness and loss of consciousness  History of Present Illness:  55 year old female with type 1 insulin-dependent diabetes who was transferred from [facility] long hospital after she presented with nausea vomiting and syncopal episode.  EKG was done which was concerning for complete AV block and ST depression in inferior leads, she was transferred under cardiology service for possible acute inferior wall STEMI and complete heart block requiring temporary pacemaker. Patient is getting medical management for NSTEMI due to acute kidney injury, PCCM was consulted for management of acute encephalopathy and insulin-dependent diabetes  Patient continued complaint of nausea denies vomiting, chest pain, fever, chills, headache  Pertinent  Medical History   Past Medical History:  Diagnosis Date  . Asthma   . Complication of anesthesia    Psuedocholinerasterase deficiency per pt  . Diabetes mellitus    TYPE II  . Diabetic macular edema (HCC)    Pt gets injections in eyes with Eyelea  . Seasonal allergies      Significant Hospital Events: Including procedures, antibiotic start and stop dates in addition to other pertinent events   . 3/27 admitted to ICU . 3/27 temporary pacemaker in place  Interim History / Subjective:  Patient is lethargic and confused.  Objective   Blood pressure 139/72, pulse 60, temperature 97.9 F (36.6 C), temperature source Oral, resp. rate (!) 22, height 5\' 3"  (1.6 m), weight 93 kg, SpO2 93 %.       No intake or output data in the 24 hours ending 12/19/20 1204 Filed Weights   12/19/20 0745  Weight: 93 kg    Examination:   Physical exam: General: Acutely ill-appearing obese female, lying on the bed HEENT: [place]/AT, eyes anicteric. Severely dry mucous membranes Neuro:  Lethargic, arousable to vocal stimuli, following simple commands Chest: Coarse breath sounds, no wheezes or rhonchi Heart: Bradycardic, paced rhythm, no murmurs or gallops Abdomen: Soft, nontender, nondistended, bowel sounds present Skin: No rash  Labs/imaging that I havepersonally reviewed    Serum potassium is 5.3, serum glucose 264, serum creatinine 4.7 Serum troponin > 70 5K, AST >3k and ALT ~2k  Resolved Hospital Problem list     Assessment & Plan:  Subacute inferior wall STEMI Complete heart block status post temporary pacemaker Acute kidney injury Shock liver Acute metabolic encephalopathy Hyperkalemia Insulin-dependent diabetes type 1 Obesity  Patient presented with signs symptom of nausea vomiting and syncopal episode, her serum troponins are elevated to 75K, EKG is concerning for inferior wall STEMI with complete heart block s/p temporary pacemaker Management per cardiology Patient serum creatinine is elevated to 4.7, likely prerenal due to inefficient cardiac output Nephrology is following Avoid nephrotoxic agent, closely monitor serum creatinine Patient LFTs are elevated likely due to congestive hepatopathy versus shock liver Closely monitor LFTs Patient is lethargic, likely due to uremic encephalopathy Closely monitor intake and output, she may require short-term dialysis but defer this decision to nephrology as of now Continue Lokelma Patient has insulin pump which was stopped Continue sliding scale insulin, started on 10 units of Lantus Monitor fingerstick with goal 140-180 Nutritionist follow-up Best practice (right click and "Reselect all SmartList Selections" daily)  Diet:  Oral Pain/Anxiety/Delirium protocol (if indicated): No VAP protocol (if indicated): Not indicated DVT prophylaxis: Systemic AC GI prophylaxis: N/A Glucose control:  SSI Yes and Basal insulin Yes Central venous access:  N/A  Arterial line:  N/A Foley:  N/A Mobility:  bed rest  PT  consulted: Yes Last date of multidisciplinary goals of care discussion [Per primary team] Code Status: Full code Disposition: ICU  PCCM will follow along, please call with question  Labs   CBC: Recent Labs  Lab 12/19/20 0532 12/19/20 0724  WBC 11.7*  --   HGB 12.3 12.2  HCT 38.5 36.0  MCV 93.7  --   PLT 173  --     Basic Metabolic Panel: Recent Labs  Lab 12/19/20 0532 12/19/20 0724  NA 137 135  K 5.4* 5.3*  CL 101 102  CO2 17*  --   GLUCOSE 264* 264*  BUN 69* 67*  CREATININE 4.34* 4.70*  CALCIUM 9.4  --    GFR: Estimated Creatinine Clearance: 14.5 mL/min (A) (by C-G formula based on SCr of 4.7 mg/dL (H)). Recent Labs  Lab 12/19/20 0532  WBC 11.7*    Liver Function Tests: Recent Labs  Lab 12/19/20 0532  AST 3,285*  ALT 1,814*  ALKPHOS 139*  BILITOT 2.4*  PROT 7.0  ALBUMIN 4.0   Recent Labs  Lab 12/19/20 0532  LIPASE 20   No results for input(s): AMMONIA in the last 168 hours.  ABG    Component Value Date/Time   TCO2 20 (L) 12/19/2020 0724     Coagulation Profile: Recent Labs  Lab 12/19/20 0739  INR 2.3*    Cardiac Enzymes: No results for input(s): CKTOTAL, CKMB, CKMBINDEX, TROPONINI in the last 168 hours.  HbA1C: No results found for: HGBA1C  CBG: No results for input(s): GLUCAP in the last 168 hours.  Review of Systems:   12 point review of systems significant for complaint mentioned in HPI, rest is negative  Past Medical History:  She,  has a past medical history of Asthma, Complication of anesthesia, Diabetes mellitus, Diabetic macular edema ([place]), and Seasonal allergies.   Surgical History:   Past Surgical History:  Procedure Laterality Date  . BACK SURGERY  2009  . CATARACT SURGERY     BILATERAL  . CESAREAN SECTION     X2  . KNEE SURGERY Right   . LEFT HEART CATH AND CORONARY ANGIOGRAPHY N/A 05/08/2017   Procedure: Left Heart Cath and Coronary Angiography;  Surgeon: Nigel Mormon, MD;  Location: [facility];  Service: Cardiovascular;  Laterality: N/A;  . ORIF ANKLE FRACTURE Left 03/23/2016   Procedure: OPEN REDUCTION INTERNAL FIXATION (ORIF) ANKLE FRACTURE MALLEOLUS  MALUNION WITH TIBIAL AND FIBULAR OSTEOTOMY AND AUTOGRAFT;  Surgeon: Wylene Simmer, MD;  Location: [facility];  Service: Orthopedics;  Laterality: Left;  . SHOULDER ARTHROSCOPY W/ ROTATOR CUFF REPAIR Left      Social History:   reports that she quit smoking about 33 years ago. Her smoking use included cigarettes. She has a 0.50 pack-year smoking history. She has never used smokeless tobacco. She reports current alcohol use. She reports that she does not use drugs.   Family History:  Her family history includes Hypertension in her father, paternal aunt, paternal grandfather, and paternal grandmother.   Allergies Allergies  Allergen Reactions  . Other Other (See Comments)  . Succinylcholine Other (See Comments)    Pseudocholinesterase deficiency     Home Medications  Prior to Admission medications   Medication Sig Start Date End Date Taking? Authorizing Provider  albuterol (PROVENTIL HFA;VENTOLIN HFA) 108 (90 Base) MCG/ACT inhaler Inhale 1-2 puffs into the lungs every 6 (six) hours as needed for wheezing or shortness  of breath.    [provider]  amLODipine (NORVASC) 5 MG tablet Take 1 tablet (5 mg total) by mouth every evening. 09/29/20 12/28/20  Adrian Prows, MD  Cholecalciferol (VITAMIN D) 2000 units tablet Take 2,000 Units by mouth daily.    [provider]  ferrous sulfate 325 (65 FE) MG tablet Take 325 mg by mouth daily with breakfast.    [provider]  furosemide (LASIX) 40 MG tablet Take 40 mg by mouth as needed for edema.  03/14/17   [provider]  insulin lispro (HUMALOG) 100 UNIT/ML injection Inject into the skin See admin instructions. Insulin Pump - Mini Medtronic 670G    [provider]  lisinopril (ZESTRIL) 5 MG tablet Take 1 tablet by mouth daily.  05/19/19   [provider]  loratadine (CLARITIN) 10 MG tablet Take 1 tablet by mouth daily as needed.    [provider]  metoprolol succinate (TOPROL-XL) 50 MG 24 hr tablet TAKE 1 TABLET BY MOUTH  DAILY 07/08/20   Adrian Prows, MD  rosuvastatin (CRESTOR) 10 MG tablet Take 10 mg by mouth at bedtime. 06/17/20   [provider]  tobramycin (TOBREX) 0.3 % ophthalmic solution Place 2 drops into both eyes as needed (prior to injections).  12/03/18   [provider]     Total critical care time: 36 minutes  Performed by: [place] care time was exclusive of separately billable procedures and treating other patients.   Critical care was necessary to treat or prevent imminent or life-threatening deterioration.   Critical care was time spent personally by me on the following activities: development of treatment plan with patient and/or surrogate as well as nursing, discussions with consultants, evaluation of patient's response to treatment, examination of patient, obtaining history from patient or surrogate, ordering and performing treatments and interventions, ordering and review of laboratory studies, ordering and review of radiographic studies, pulse oximetry and re-evaluation of patient's condition.   Jacky Kindle MD [facility] See Amion for pager If no response to pager, please call [phone] until 7pm After 7pm, Please call E-link [phone]

## 2020-12-19 NOTE — ED Provider Notes (Signed)
I was given this patient's EKG at approximately 6:57 AM.  Patient apparently had abrupt change in heart rate down to 30s.  Initial heart rate was in the 70s.  She had elevation noted in leads III and aVF.  However EKG revealed high-grade AV block.  I discussed the case with Dr. Virgina Jock at approximately 7:15 AM.  He is on-call for her cardiologist.  He has refused her EKG via text message.  He is concerned about the heart block, and also the elevation in the inferior leads.  He will call [facility] to ensure rapid transfer to [facility] for pacemaker placement.  Since she is not having any active chest pain at this time, he does not want me to activate a code STEMI Patient and family were updated on plan.  Patient is awake/alert.  Her blood pressure is appropriate.  Heparin has been ordered.  Pacemaker monitoring has been ordered.  Patient does report she hit her head during the syncopal episode, but no headache or change in mental status.   Ripley Fraise, MD 12/19/20 [phone]

## 2020-12-19 NOTE — Progress Notes (Signed)
Chest pain free at this time. BP 100/70 mmHg, pacing at 60 bpm. No urine output. Will place Foley catheter. Appreciate CCM and nephrology consult.   Nigel Mormon, MD Pager: [phone] Office: [phone]

## 2020-12-19 NOTE — Progress Notes (Signed)
Patwardhan, MD attempted bedside transvenous wire repositioning that was unsuccessful for cardiac capture. Pt prepared to go to Cath Lab. Pt is currently Transcutaneously pacing 44mA, 70PPM. Transvenous Pacing VVI 40/16/2

## 2020-12-19 NOTE — Progress Notes (Signed)
PCCM INTERVAL PROGRESS NOTE   Called to bedside for patient with hemodynamic collapse for pacemaker failure to capture now back on TC pacing. BP dropped with opioid admin for TC pacing comfort. BP improved. On further review of newly resulted labs, it appears the patient is now in DKA. Labs also notable for worsening transaminates, renal failure, and hyperkalemia to 5.3.   Start insulin infusion per endotool DKA protocol: only give half bolus considering the state of her heart.  BMP q 4 hours Trend LFT- likely shock liver.    Georgann Housekeeper, AGACNP-BC [facility]  See Amion for personal pager PCCM on call pager [phone] until 7pm. Please call Elink 7p-7a. [phone]  12/19/2020 10:35 PM

## 2020-12-19 NOTE — Progress Notes (Signed)
ANTICOAGULATION CONSULT NOTE - Initial Consult  Pharmacy Consult for IV heparin Indication: chest pain/ACS  Allergies  Allergen Reactions  . Other Other (See Comments)  . Succinylcholine Other (See Comments)    Pseudocholinesterase deficiency    Patient Measurements: Height: 5\' 3"  (160 cm) Weight: 93 kg (205 lb) IBW/kg (Calculated) : 52.4 Heparin Dosing Weight: 73.7 kg  Vital Signs: Temp: 97.9 F (36.6 C) (03/27 0520) Temp Source: Oral (03/27 0520) BP: 139/72 (03/27 0958) Pulse Rate: 60 (03/27 0958)  Labs: Recent Labs    12/19/20 0532 12/19/20 0715 12/19/20 0724 12/19/20 0739  HGB 12.3  --  12.2  --   HCT 38.5  --  36.0  --   PLT 173  --   --   --   APTT  --   --   --  31  LABPROT  --   --   --  24.7*  INR  --   --   --  2.3*  CREATININE 4.34*  --  4.70*  --   TROPONINIHS  --  33,832*  --   --     Estimated Creatinine Clearance: 14.5 mL/min (A) (by C-G formula based on SCr of 4.7 mg/dL (H)).   Medical History: Past Medical History:  Diagnosis Date  . Asthma   . Complication of anesthesia    Psuedocholinerasterase deficiency per pt  . Diabetes mellitus    TYPE II  . Diabetic macular edema (HCC)    Pt gets injections in eyes with Eyelea  . Seasonal allergies     Medications:  Infusions:  . heparin    . promethazine (PHENERGAN) injection      Assessment: 55 yo female admitted with complete heart block.  Suspicion for ACS, elevated troponins, but cath deferred due to elevated SCr.  Had temporary pacer placed this AM.  Pharmacy asked to begin anticoagulation with IV heparin.  No anticoagulants noted PTA.  No overt bleeding or complications noted.  Baseline CBC WNL.  Discussed with Dr. Virgina Jock, will not bolus heparin given sheaths in place.  Also adding IV cangrelor for P2Y12 inhibition while awaiting decision on permanent pacemaker.  Goal of Therapy:  Heparin level 0.3-0.7 units/ml Monitor platelets by anticoagulation protocol: Yes   Plan:   Start IV heparin at rate of 1050 units/hr. Check heparin level in 8 hrs. Daily heparin level and CBC. Add cangrelor at maintenance dose 0.75 mcg/kg/min.  [place] Crane, Roylene Reason, BCCP Clinical Pharmacist  12/19/2020 11:15 AM   [facility] pharmacy phone numbers are listed on [email or web address]

## 2020-12-19 NOTE — H&P (Addendum)
Colleen [name] is an 55 y.o. female.   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  EKG showed inferior ST elevation, along with sinus rhythm.  Initially, I was contacted for complete AV block.  However, given ST depression, there was concern for inferior STEMI causing complete AV block.  I thus activated Cath Lab for urgent coronary angiogram with or without temporary pacemaker placement.    Patient has been having severe nausea, vomiting, diarrhea since Friday morning. She has not been able to keep anything solid down. On Friday, she thinks she briefly lost consciousness and fell. CT head today showed no significant injury.    She initially denied any chest pain, but later confirms she has been having off and on chest pain on deep inhalation since Friday.   Last known Cr was in 09/2019, 1.3.  LFT's were normal  Past Medical History:  Diagnosis Date  . Asthma   . Complication of anesthesia    Psuedocholinerasterase deficiency per pt  . Diabetes mellitus    TYPE II  . Diabetic macular edema (HCC)    Pt gets injections in eyes with Eyelea  . Seasonal allergies     Past Surgical History:  Procedure Laterality Date  . BACK SURGERY  2009  . CATARACT SURGERY     BILATERAL  . CESAREAN SECTION     X2  . KNEE SURGERY Right   . LEFT HEART CATH AND CORONARY ANGIOGRAPHY N/A 05/08/2017   Procedure: Left Heart Cath and Coronary Angiography;  Surgeon: Nigel Mormon, MD;  Location: [place] [facility];  Service: Cardiovascular;  Laterality: N/A;  . ORIF ANKLE FRACTURE Left 03/23/2016   Procedure: OPEN REDUCTION INTERNAL FIXATION (ORIF) ANKLE FRACTURE MALLEOLUS  MALUNION WITH TIBIAL AND FIBULAR OSTEOTOMY AND AUTOGRAFT;  Surgeon: Wylene Simmer, MD;  Location: [place];  Service: Orthopedics;   Laterality: Left;  . SHOULDER ARTHROSCOPY W/ ROTATOR CUFF REPAIR Left      Family History  Problem Relation Age of Onset  . Hypertension Father   . Hypertension Paternal Aunt   . Hypertension Paternal Grandmother   . Hypertension Paternal Grandfather     Social History:  reports that she quit smoking about 33 years ago. Her smoking use included cigarettes. She has a 0.50 pack-year smoking history. She has never used smokeless tobacco. She reports current alcohol use. She reports that she does not use drugs.  Allergies:  Allergies  Allergen Reactions  . Other Other (See Comments)  . Succinylcholine Other (See Comments)    Pseudocholinesterase deficiency    Review of Systems  Constitutional: Positive for malaise/fatigue. Negative for decreased appetite, weight gain and weight loss.  HENT: Negative for congestion.   Eyes: Negative for visual disturbance.  Cardiovascular: Positive for chest pain and syncope. Negative for dyspnea on exertion, leg swelling and palpitations.  Respiratory: Negative for cough.   Endocrine: Negative for cold intolerance.  Hematologic/Lymphatic: Does not bruise/bleed easily.  Skin: Negative for itching and rash.  Musculoskeletal: Negative for myalgias.  Gastrointestinal: Positive for nausea and vomiting. Negative for abdominal pain.  Genitourinary: Negative for dysuria.  Neurological: Negative for dizziness and weakness.  Psychiatric/Behavioral: The patient is not nervous/anxious.   All other systems reviewed and are negative.    Blood pressure (!) 128/59, pulse (!) 33, temperature 97.9  F (36.6 C), temperature source Oral, resp. rate 17, height 5\' 3"  (1.6 m), weight 93 kg, SpO2 95 %. Body mass index is 36.31 kg/m.  Physical Exam Vitals and nursing note reviewed.  Constitutional:      General: She is not in acute distress.    Appearance: She is well-developed. She is ill-appearing. She is not toxic-appearing.  HENT:     Head: Normocephalic and  atraumatic.     Mouth/Throat:     Mouth: Mucous membranes are dry.  Eyes:     Conjunctiva/sclera: Conjunctivae normal.     Pupils: Pupils are equal, round, and reactive to light.  Neck:     Vascular: No JVD.  Cardiovascular:     Rate and Rhythm: Regular rhythm. Bradycardia present.     Pulses: Normal pulses and intact distal pulses.     Heart sounds: No murmur heard.   Pulmonary:     Effort: Pulmonary effort is normal.     Breath sounds: Normal breath sounds. No wheezing or rales.  Abdominal:     General: Bowel sounds are normal.     Palpations: Abdomen is soft.     Tenderness: There is no rebound.  Musculoskeletal:        General: No tenderness. Normal range of motion.     Right lower leg: No edema.     Left lower leg: No edema.  Lymphadenopathy:     Cervical: No cervical adenopathy.  Skin:    General: Skin is warm and dry.  Neurological:     Mental Status: She is alert and oriented to person, place, and time.     Cranial Nerves: No cranial nerve deficit.     Comments: Somnolent     Results for orders placed or performed during the hospital encounter of 12/19/20 (from the past 48 hour(s))  Lipase, blood     Status: None   Collection Time: 12/19/20  5:32 AM  Result Value Ref Range   Lipase 20 11 - 51 U/L    Comment: Performed at [facility], [place] [address], [place], [place] [address]  Comprehensive metabolic panel     Status: Abnormal (Preliminary result)   Collection Time: 12/19/20  5:32 AM  Result Value Ref Range   Sodium 137 135 - 145 mmol/L   Potassium 5.4 (H) 3.5 - 5.1 mmol/L   Chloride 101 98 - 111 mmol/L   CO2 17 (L) 22 - 32 mmol/L   Glucose, Bld 264 (H) 70 - 99 mg/dL    Comment: Glucose reference range applies only to samples taken after fasting for at least 8 hours.   BUN 69 (H) 6 - 20 mg/dL   Creatinine, Ser 4.34 (H) 0.44 - 1.00 mg/dL   Calcium 9.4 8.9 - 10.3 mg/dL   Total Protein 7.0 6.5 - 8.1 g/dL   Albumin 4.0 3.5 - 5.0 g/dL    AST PENDING 15 - 41 U/L   ALT 1,814 (H) 0 - 44 U/L   Alkaline Phosphatase 139 (H) 38 - 126 U/L   Total Bilirubin 2.4 (H) 0.3 - 1.2 mg/dL   GFR, Estimated 11 (L) >60 mL/min    Comment: (NOTE) Calculated using the CKD-EPI Creatinine Equation (2021)    Anion gap 19 (H) 5 - 15    Comment: Performed at [facility], [place] [address] [place], [place] [address]  CBC     Status: Abnormal   Collection Time: 12/19/20  5:32 AM  Result Value Ref Range  WBC 11.7 (H) 4.0 - 10.5 K/uL   RBC 4.11 3.87 - 5.11 MIL/uL   Hemoglobin 12.3 12.0 - 15.0 g/dL   HCT 38.5 36.0 - 46.0 %   MCV 93.7 80.0 - 100.0 fL   MCH 29.9 26.0 - 34.0 pg   MCHC 31.9 30.0 - 36.0 g/dL   RDW 13.2 11.5 - 15.5 %   Platelets 173 150 - 400 K/uL   nRBC 0.0 0.0 - 0.2 %    Comment: Performed at [facility], [place] [address] [place], [place] [address]  Urinalysis, Routine w reflex microscopic Urine, Clean Catch     Status: Abnormal   Collection Time: 12/19/20  5:32 AM  Result Value Ref Range   Color, Urine AMBER (A) YELLOW    Comment: BIOCHEMICALS MAY BE AFFECTED BY COLOR   APPearance CLOUDY (A) CLEAR   Specific Gravity, Urine 1.017 1.005 - 1.030   pH 5.0 5.0 - 8.0   Glucose, UA 50 (A) NEGATIVE mg/dL   Hgb urine dipstick NEGATIVE NEGATIVE   Bilirubin Urine NEGATIVE NEGATIVE   Ketones, ur 5 (A) NEGATIVE mg/dL   Protein, ur 30 (A) NEGATIVE mg/dL   Nitrite NEGATIVE NEGATIVE   Leukocytes,Ua NEGATIVE NEGATIVE   RBC / HPF 6-10 0 - 5 RBC/hpf   WBC, UA 6-10 0 - 5 WBC/hpf   Bacteria, UA FEW (A) NONE SEEN   Squamous Epithelial / LPF 0-5 0 - 5   Mucus PRESENT    Hyaline Casts, UA PRESENT     Comment: Performed at [facility], [place] [address] [place], [place] [address]  I-Stat beta hCG blood, ED     Status: None   Collection Time: 12/19/20  6:02 AM  Result Value Ref Range   I-stat hCG, quantitative <5.0 <5 mIU/mL   Comment 3            Comment:   GEST. AGE      CONC.   (mIU/mL)   <=1 WEEK        5 - 50     2 WEEKS       50 - 500     3 WEEKS       100 - 10,000     4 WEEKS     1,000 - 30,000        FEMALE AND NON-PREGNANT FEMALE:     LESS THAN 5 mIU/mL   I-stat chem 8, ED (not at [facility] or [facility])     Status: Abnormal   Collection Time: 12/19/20  7:24 AM  Result Value Ref Range   Sodium 135 135 - 145 mmol/L   Potassium 5.3 (H) 3.5 - 5.1 mmol/L   Chloride 102 98 - 111 mmol/L   BUN 67 (H) 6 - 20 mg/dL   Creatinine, Ser 4.70 (H) 0.44 - 1.00 mg/dL   Glucose, Bld 264 (H) 70 - 99 mg/dL    Comment: Glucose reference range applies only to samples taken after fasting for at least 8 hours.   Calcium, Ion 1.09 (L) 1.15 - 1.40 mmol/L   TCO2 20 (L) 22 - 32 mmol/L   Hemoglobin 12.2 12.0 - 15.0 g/dL   HCT 36.0 36.0 - 46.0 %    Labs:   Lab Results  Component Value Date   WBC 11.7 (H) 12/19/2020   HGB 12.2 12/19/2020   HCT 36.0 12/19/2020   MCV 93.7 12/19/2020   PLT 173 12/19/2020    Recent Labs  Lab 12/19/20 0532 12/19/20  0724  NA 137 135  K 5.4* 5.3*  CL 101 102  CO2 17*  --   BUN 69* 67*  CREATININE 4.34* 4.70*  CALCIUM 9.4  --   PROT 7.0  --   BILITOT 2.4*  --   ALKPHOS 139*  --   ALT 1,814*  --   AST PENDING  --   GLUCOSE 264* 264*    Lipid Panel     Component Value Date/Time   CHOL 164 10/08/2019 0901   TRIG 52 10/08/2019 0901   HDL 99 10/08/2019 0901   LDLCALC 54 10/08/2019 0901    BNP (last 3 results)  Pending HEMOGLOBIN A1C Pending  Cardiac Panel (last 3 results) Pending TSH Pending     Current Facility-Administered Medications:  .  alum & mag hydroxide-simeth (MAALOX/MYLANTA) 200-200-20 MG/5ML suspension 30 mL, 30 mL, Oral, Once **AND** [COMPLETED] lidocaine (XYLOCAINE) 2 % viscous mouth solution 15 mL, 15 mL, Oral, Once, Belaya, Maria A, PA-C, 15 mL at 12/19/20 0701 .  fentaNYL (SUBLIMAZE) injection 25 mcg, 25 mcg, Intravenous, Once, Belaya, Maria A, PA-C .  sodium chloride 0.9 % bolus 1,000 mL, 1,000 mL, Intravenous,  Once, Lyndel Safe, Held at 12/19/20 4010  Current Outpatient Medications:  .  albuterol (PROVENTIL HFA;VENTOLIN HFA) 108 (90 Base) MCG/ACT inhaler, Inhale 1-2 puffs into the lungs every 6 (six) hours as needed for wheezing or shortness of breath., Disp: , Rfl:  .  amLODipine (NORVASC) 5 MG tablet, Take 1 tablet (5 mg total) by mouth every evening., Disp: 30 tablet, Rfl: 2 .  Cholecalciferol (VITAMIN D) 2000 units tablet, Take 2,000 Units by mouth daily., Disp: , Rfl:  .  ferrous sulfate 325 (65 FE) MG tablet, Take 325 mg by mouth daily with breakfast., Disp: , Rfl:  .  furosemide (LASIX) 40 MG tablet, Take 40 mg by mouth as needed for edema. , Disp: , Rfl: 2 .  insulin lispro (HUMALOG) 100 UNIT/ML injection, Inject into the skin See admin instructions. Insulin Pump - Mini Medtronic 670G, Disp: , Rfl:  .  lisinopril (ZESTRIL) 5 MG tablet, Take 1 tablet by mouth daily., Disp: , Rfl:  .  loratadine (CLARITIN) 10 MG tablet, Take 1 tablet by mouth daily as needed., Disp: , Rfl:  .  metoprolol succinate (TOPROL-XL) 50 MG 24 hr tablet, TAKE 1 TABLET BY MOUTH  DAILY, Disp: 90 tablet, Rfl: 3 .  rosuvastatin (CRESTOR) 10 MG tablet, Take 10 mg by mouth at bedtime., Disp: , Rfl:  .  tobramycin (TOBREX) 0.3 % ophthalmic solution, Place 2 drops into both eyes as needed (prior to injections). , Disp: , Rfl:    Today's Vitals   12/19/20 0529 12/19/20 0615 12/19/20 0658 12/19/20 0745  BP:  (!) 136/102 (!) 128/59   Pulse:  (!) 34 (!) 33   Resp:  18 17   Temp:      TempSrc:      SpO2:  99% 95%   Weight:    93 kg  Height:    5\' 3"  (1.6 m)  PainSc: 0-No pain      Body mass index is 36.31 kg/m.   CARDIAC STUDIES:  EKG 12/19/2020: Sinus rhythm with third-degree AV block Junctional escape rhythm at 34 bpm Inferior infarct, likely acute  Coronary angiogram 05/08/2017: Ost 1st Diag to 1st Diag lesion, 80%stenosed. Prox LAD lesion, 40% stenosed. Ost 2nd Diag lesion, 60% stenosed.  Nuclear  stress test  [03/26/2017]: 1. Patient attempted treadmill stress, terminated after 2  minutes due to dyspnea. Converted to pharmacologic stress. The resting electrocardiogram demonstrated normal sinus rhythm, normal resting conduction, no resting arrhythmias and normal rest repolarization. Stress EKG is non-diagnostic for ischemia as it a pharmacologic stress using Lexiscan. Stress symptoms included dyspnea. 2. The LV is dilated both at rest and stress images. The LV end diastolic volume was 382NK. There is a moderate area of ischemia in the basal anterior, basal anteroseptal, mid anterior, mid anteroseptal and apical anterior myocardial wall(s). Overall left ventricular systolic function was abnormal without regional wall motion abnormalities. The left ventricular ejection fraction was calculated or visually estimated to be 41%. This is a high risk study, consider further cardiac work-up  Echocardiogram  [03/20/2017]: Left ventricle cavity is normal in size. Moderate concentric hypertrophy of the left ventricle. Normal global wall motion. Doppler evidence of grade II (pseudonormal) diastolic dysfunction. Diastolic dysfunction suggests elevated LA/LV endiastolic pressure. Calculated EF 61%. Trace tricuspid regurgitation. Unable to estimate PA pressure due to absence/minimal TR signal. IVC is dilated with blunted respiratory response. Suggests elevated central venous pressure.    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  Complete AV block, STEMI: Initial plan was to perform coronary angiography possible intervention, followed by temporary wire placement if needed. However, subsequent labs came back with elevated creatinine of 4.3.  I suspect this is due to prerenal injury in the setting of complete AV block.  Given risk of causing further renal injury with contrast dye,  I decided to  first obtain a stat echocardiogram. My initial impression of bedside echocardiogram was normal LVEF with no akinesis. I felt the risks of cardiac catheterization outweigh the benefits. Therefore, I decided to hold cath and placed temporary transvenous pacemaker.   Subsequently, her trop HS came back 75,000. Cr has further increased to 4.7. Further review of echocardiogram suggests apex forming dilated RV with moderate RV dysfunction. I suspect she started RV infarct on Friday, still not completed. She is hemodynamically stable, but has signs of right sided congestion with elevated liver enzymes, and AKI, along with elevated anion gap. I still think benefits of coronary angiogram and revascularization for late presentation infarct outweigh compounding the risks of causing further CIN and almost imminent dialysis in otherwise hemodynamically stable patient.   I discussed the same with the patient and her husband at bedside. We are all in agreement of pursuing more conservative course of management.  Recommend IV heparin. We will need to wait and watch to look if her AV node function would recover or not. I do not think she will get permanent pacemaker in next 2-3 days. I will add IV cangrelor at least for next 2-3 days while we wait for AV nodal recovery, if possible. Ideally, she will also need coronary angiogram at some point before decision making re: pacemaker.  Given AKI, CHB, elevated LFT, will hold off ACEi, beta blocker, statin.  Given that she is hemodynamically stable, I will not add any inotropes. Given her RV dilatation, I would avoid additional fluids at this time.  AKI: Likely pre-renal due to dehydration and complete AV block. Will get nephrology input  Liver enzyme elevation: Secondary to above  Type 1 DM: On insulin pump at home.  Has anion gap but no acidosis. Given her recent infarct, I anticipate she could develop DKA.  Will seek critical care input re: management   CRITICAL  CARE Performed by: Joya Gaskins  Quintus Premo   Total critical care time: 120 minutes   Critical care time was exclusive of separately billable procedures and treating other patients.   Critical care was necessary to treat or prevent imminent or life-threatening deterioration.   Critical care was time spent personally by me on the following activities: development of treatment plan with patient and/or surrogate as well as nursing, discussions with consultants, evaluation of patient's response to treatment, examination of patient, obtaining history from patient or surrogate, ordering and performing treatments and interventions, ordering and review of laboratory studies, ordering and review of radiographic studies, pulse oximetry and re-evaluation of patient's condition.      Nigel Mormon, MD Pager: [phone] Office: [phone]

## 2020-12-19 NOTE — Progress Notes (Signed)
I was called re: loss of capture of transvenous pacemaker.   Attempted bedside repositioning under C-arm. It appears that she pacer cover is detached from the 6 Fr sheath, so not sterile anymore. Need to go cath to cath lab to place a new pacemaker. Continue transcutaneous pacing for now.  Patient is awake, BP 90-100/60-70 mmHg, but appears somnolent. Spoke with the husband over the phone and verbal consent obtained.   CRITICAL CARE Performed by: Vernell Leep   Total critical care time: 35 minutes   Critical care time was exclusive of separately billable procedures and treating other patients.   Critical care was necessary to treat or prevent imminent or life-threatening deterioration.   Critical care was time spent personally by me on the following activities: development of treatment plan with patient and/or surrogate as well as nursing, discussions with consultants, evaluation of patient's response to treatment, examination of patient, obtaining history from patient or surrogate, ordering and performing treatments and interventions, ordering and review of laboratory studies, ordering and review of radiographic studies, pulse oximetry and re-evaluation of patient's condition.      Nigel Mormon, MD Pager: [phone] Office: [phone]

## 2020-12-19 NOTE — Interval H&P Note (Signed)
History and Physical Interval Note:  12/19/2020 10:13 PM  Colleen [name]  has presented today for surgery, with the diagnosis of STEMI.  The various methods of treatment have been discussed with the patient and family. After consideration of risks, benefits and other options for treatment, the patient has consented to  Procedure(s): TEMPORARY PACEMAKER (N/A) as a surgical intervention.  The patient's history has been reviewed, patient examined, no change in status, stable for surgery.  I have reviewed the patient's chart and labs.  Questions were answered to the patient's satisfaction.     [place]

## 2020-12-19 NOTE — Progress Notes (Signed)
CCM at bedside for Arterial line Placement.

## 2020-12-19 NOTE — Progress Notes (Addendum)
Rn noticed that patient was innappriaoptiate pacing via Transvenous pacing. RN called Patwardhan. MD. RN instructed to start Transcutaneous pacing patient, give 79mcg Fent for pain management. Pt is currently transcutaneously pacing at 51mA, with a rate of 70. Pt blood pressure is slowly decreasing.

## 2020-12-19 NOTE — Progress Notes (Signed)
  Echocardiogram 2D Echocardiogram has been performed.  Colleen [name] 12/19/2020, 3:28 PM

## 2020-12-19 NOTE — ED Notes (Signed)
Pt being transferred to cath lab at [facility], cardiologist called [facility], will be here shortly, will continue to monitor pt

## 2020-12-20 ENCOUNTER — Encounter (HOSPITAL_COMMUNITY): Payer: Self-pay | Admitting: Cardiology

## 2020-12-20 ENCOUNTER — Inpatient Hospital Stay (HOSPITAL_COMMUNITY): Payer: 59

## 2020-12-20 DIAGNOSIS — I4430 Unspecified atrioventricular block: Secondary | ICD-10-CM | POA: Diagnosis not present

## 2020-12-20 DIAGNOSIS — I442 Atrioventricular block, complete: Secondary | ICD-10-CM

## 2020-12-20 DIAGNOSIS — E109 Type 1 diabetes mellitus without complications: Secondary | ICD-10-CM | POA: Diagnosis not present

## 2020-12-20 DIAGNOSIS — N179 Acute kidney failure, unspecified: Secondary | ICD-10-CM | POA: Diagnosis not present

## 2020-12-20 LAB — BASIC METABOLIC PANEL
Anion gap: 17 — ABNORMAL HIGH (ref 5–15)
Anion gap: 11 (ref 5–15)
Anion gap: 10 (ref 5–15)
BUN: 85 mg/dL — ABNORMAL HIGH (ref 6–20)
BUN: 90 mg/dL — ABNORMAL HIGH (ref 6–20)
BUN: 91 mg/dL — ABNORMAL HIGH (ref 6–20)
CO2: 14 mmol/L — ABNORMAL LOW (ref 22–32)
CO2: 20 mmol/L — ABNORMAL LOW (ref 22–32)
CO2: 21 mmol/L — ABNORMAL LOW (ref 22–32)
Calcium: 7.8 mg/dL — ABNORMAL LOW (ref 8.9–10.3)
Calcium: 8.8 mg/dL — ABNORMAL LOW (ref 8.9–10.3)
Calcium: 8.6 mg/dL — ABNORMAL LOW (ref 8.9–10.3)
Chloride: 102 mmol/L (ref 98–111)
Chloride: 102 mmol/L (ref 98–111)
Chloride: 104 mmol/L (ref 98–111)
Creatinine, Ser: 5.13 mg/dL — ABNORMAL HIGH (ref 0.44–1.00)
Creatinine, Ser: 4.93 mg/dL — ABNORMAL HIGH (ref 0.44–1.00)
Creatinine, Ser: 4.44 mg/dL — ABNORMAL HIGH (ref 0.44–1.00)
GFR, Estimated: 9 mL/min — ABNORMAL LOW (ref >60)
GFR, Estimated: 10 mL/min — ABNORMAL LOW (ref >60)
GFR, Estimated: 11 mL/min — ABNORMAL LOW (ref >60)
Glucose, Bld: 293 mg/dL — ABNORMAL HIGH (ref 70–99)
Glucose, Bld: 208 mg/dL — ABNORMAL HIGH (ref 70–99)
Glucose, Bld: 85 mg/dL (ref 70–99)
Potassium: 4.4 mmol/L (ref 3.5–5.1)
Potassium: 4.3 mmol/L (ref 3.5–5.1)
Potassium: 4.1 mmol/L (ref 3.5–5.1)
Sodium: 133 mmol/L — ABNORMAL LOW (ref 135–145)
Sodium: 133 mmol/L — ABNORMAL LOW (ref 135–145)
Sodium: 135 mmol/L (ref 135–145)

## 2020-12-20 LAB — COOXEMETRY PANEL
Carboxyhemoglobin: 0.6 % (ref 0.5–1.5)
Carboxyhemoglobin: 0.7 % (ref 0.5–1.5)
Methemoglobin: 0.8 % (ref 0.0–1.5)
Methemoglobin: 1 % (ref 0.0–1.5)
O2 Saturation: 57.2 %
O2 Saturation: 55.7 %
Total hemoglobin: 12.4 g/dL (ref 12.0–16.0)
Total hemoglobin: 11.2 g/dL — ABNORMAL LOW (ref 12.0–16.0)

## 2020-12-20 LAB — LIPID PANEL
Cholesterol: 120 mg/dL (ref 0–200)
HDL: 59 mg/dL (ref >40)
LDL Cholesterol: 42 mg/dL (ref 0–99)
Total CHOL/HDL Ratio: 2 RATIO
Triglycerides: 96 mg/dL (ref <150)
VLDL: 19 mg/dL (ref 0–40)

## 2020-12-20 LAB — CBC
HCT: 33.6 % — ABNORMAL LOW (ref 36.0–46.0)
Hemoglobin: 10.9 g/dL — ABNORMAL LOW (ref 12.0–15.0)
MCH: 29.8 pg (ref 26.0–34.0)
MCHC: 32.4 g/dL (ref 30.0–36.0)
MCV: 91.8 fL (ref 80.0–100.0)
Platelets: 130 10*3/uL — ABNORMAL LOW (ref 150–400)
RBC: 3.66 MIL/uL — ABNORMAL LOW (ref 3.87–5.11)
RDW: 12.9 % (ref 11.5–15.5)
WBC: 13 10*3/uL — ABNORMAL HIGH (ref 4.0–10.5)
nRBC: 0 % (ref 0.0–0.2)

## 2020-12-20 LAB — HEPATIC FUNCTION PANEL
ALT: 2931 U/L — ABNORMAL HIGH (ref 0–44)
AST: 4893 U/L — ABNORMAL HIGH (ref 15–41)
Albumin: 3.4 g/dL — ABNORMAL LOW (ref 3.5–5.0)
Alkaline Phosphatase: 114 U/L (ref 38–126)
Bilirubin, Direct: 0.5 mg/dL — ABNORMAL HIGH (ref 0.0–0.2)
Indirect Bilirubin: 1.5 mg/dL — ABNORMAL HIGH (ref 0.3–0.9)
Total Bilirubin: 2 mg/dL — ABNORMAL HIGH (ref 0.3–1.2)
Total Protein: 5.6 g/dL — ABNORMAL LOW (ref 6.5–8.1)

## 2020-12-20 LAB — GLUCOSE, CAPILLARY
Glucose-Capillary: 293 mg/dL — ABNORMAL HIGH (ref 70–99)
Glucose-Capillary: 262 mg/dL — ABNORMAL HIGH (ref 70–99)
Glucose-Capillary: 249 mg/dL — ABNORMAL HIGH (ref 70–99)
Glucose-Capillary: 222 mg/dL — ABNORMAL HIGH (ref 70–99)
Glucose-Capillary: 181 mg/dL — ABNORMAL HIGH (ref 70–99)
Glucose-Capillary: 212 mg/dL — ABNORMAL HIGH (ref 70–99)
Glucose-Capillary: 178 mg/dL — ABNORMAL HIGH (ref 70–99)
Glucose-Capillary: 195 mg/dL — ABNORMAL HIGH (ref 70–99)
Glucose-Capillary: 201 mg/dL — ABNORMAL HIGH (ref 70–99)
Glucose-Capillary: 190 mg/dL — ABNORMAL HIGH (ref 70–99)
Glucose-Capillary: 173 mg/dL — ABNORMAL HIGH (ref 70–99)
Glucose-Capillary: 153 mg/dL — ABNORMAL HIGH (ref 70–99)
Glucose-Capillary: 102 mg/dL — ABNORMAL HIGH (ref 70–99)
Glucose-Capillary: 79 mg/dL (ref 70–99)
Glucose-Capillary: 106 mg/dL — ABNORMAL HIGH (ref 70–99)
Glucose-Capillary: 78 mg/dL (ref 70–99)
Glucose-Capillary: 127 mg/dL — ABNORMAL HIGH (ref 70–99)

## 2020-12-20 LAB — RENAL FUNCTION PANEL
Albumin: 3.4 g/dL — ABNORMAL LOW (ref 3.5–5.0)
Anion gap: 17 — ABNORMAL HIGH (ref 5–15)
BUN: 91 mg/dL — ABNORMAL HIGH (ref 6–20)
CO2: 14 mmol/L — ABNORMAL LOW (ref 22–32)
Calcium: 7.9 mg/dL — ABNORMAL LOW (ref 8.9–10.3)
Chloride: 102 mmol/L (ref 98–111)
Creatinine, Ser: 5.23 mg/dL — ABNORMAL HIGH (ref 0.44–1.00)
GFR, Estimated: 9 mL/min — ABNORMAL LOW (ref >60)
Glucose, Bld: 242 mg/dL — ABNORMAL HIGH (ref 70–99)
Phosphorus: 7.1 mg/dL — ABNORMAL HIGH (ref 2.5–4.6)
Potassium: 4.3 mmol/L (ref 3.5–5.1)
Sodium: 133 mmol/L — ABNORMAL LOW (ref 135–145)

## 2020-12-20 LAB — BETA-HYDROXYBUTYRIC ACID
Beta-Hydroxybutyric Acid: 0.24 mmol/L (ref 0.05–0.27)
Beta-Hydroxybutyric Acid: 0.18 mmol/L (ref 0.05–0.27)

## 2020-12-20 LAB — MRSA PCR SCREENING: MRSA by PCR: NEGATIVE

## 2020-12-20 LAB — POTASSIUM: Potassium: 4.3 mmol/L (ref 3.5–5.1)

## 2020-12-20 LAB — HEPARIN LEVEL (UNFRACTIONATED)
Heparin Unfractionated: 0.13 IU/mL — ABNORMAL LOW (ref 0.30–0.70)
Heparin Unfractionated: 0.21 IU/mL — ABNORMAL LOW (ref 0.30–0.70)

## 2020-12-20 MED ORDER — SODIUM CHLORIDE 0.9% FLUSH: 10.0000 mL | INTRAVENOUS | Status: DC | PRN

## 2020-12-20 MED ORDER — SODIUM CHLORIDE 0.9 % IV SOLN
1.0000 g | Freq: Once | INTRAVENOUS | Status: AC
  Administered 2020-12-20: 1 g via INTRAVENOUS
  Filled 2020-12-20: qty 10

## 2020-12-20 MED ORDER — ALBUTEROL SULFATE (2.5 MG/3ML) 0.083% IN NEBU
10.0000 mg | INHALATION_SOLUTION | Freq: Once | RESPIRATORY_TRACT | Status: AC
  Administered 2020-12-20: 10 mg via RESPIRATORY_TRACT
  Filled 2020-12-20: qty 12

## 2020-12-20 MED ORDER — INSULIN ASPART 100 UNIT/ML IV SOLN
5.0000 [IU] | Freq: Once | INTRAVENOUS | Status: AC
  Administered 2020-12-20: 5 [IU] via INTRAVENOUS

## 2020-12-20 MED ORDER — SODIUM BICARBONATE 8.4 % IV SOLN
50.0000 meq | Freq: Once | INTRAVENOUS | Status: AC
  Administered 2020-12-20: 50 meq via INTRAVENOUS
  Filled 2020-12-20: qty 50

## 2020-12-20 MED ORDER — SODIUM CHLORIDE 0.9% FLUSH
10.0000 mL | Freq: Two times a day (BID) | INTRAVENOUS | Status: DC
  Administered 2020-12-20 – 2020-12-30 (×18): 10 mL

## 2020-12-20 MED ORDER — INSULIN ASPART 100 UNIT/ML ~~LOC~~ SOLN
1.0000 [IU] | SUBCUTANEOUS | Status: DC
  Administered 2020-12-20: 1 [IU] via SUBCUTANEOUS
  Administered 2020-12-21 (×2): 2 [IU] via SUBCUTANEOUS
  Administered 2020-12-21: 1 [IU] via SUBCUTANEOUS
  Administered 2020-12-22: 2 [IU] via SUBCUTANEOUS

## 2020-12-20 MED ORDER — DOBUTAMINE IN D5W 4-5 MG/ML-% IV SOLN
2.5000 ug/kg/min | INTRAVENOUS | Status: DC
  Administered 2020-12-20: 2.5 ug/kg/min via INTRAVENOUS
  Filled 2020-12-20: qty 250

## 2020-12-20 MED ORDER — INSULIN DETEMIR 100 UNIT/ML ~~LOC~~ SOLN
24.0000 [IU] | Freq: Two times a day (BID) | SUBCUTANEOUS | Status: DC
  Administered 2020-12-20 – 2020-12-22 (×4): 24 [IU] via SUBCUTANEOUS
  Filled 2020-12-20 (×7): qty 0.24

## 2020-12-20 NOTE — Procedures (Signed)
Arterial Catheter Insertion Procedure Note  Colleen [name]  [id number]  21-Nov-1965  Date:12/20/20  Time:12:30 AM    Provider Performing: Collier Bullock    Procedure: Insertion of Arterial Line [phone]) without US guidance  Indication(s) Blood pressure monitoring and/or need for frequent ABGs  Consent Unable to obtain consent due to emergent nature of procedure.  Anesthesia None   Time Out Verified patient identification, verified procedure, site/side was marked, verified correct patient position, special equipment/implants available, medications/allergies/relevant history reviewed, required imaging and test results available.   Sterile Technique Maximal sterile technique including full sterile barrier drape, hand hygiene, sterile gown, sterile gloves, mask, hair covering, sterile ultrasound probe cover (if used).   Procedure Description Area of catheter insertion was cleaned with chlorhexidine and draped in sterile fashion. With real-time ultrasound guidance an arterial catheter was placed into the left radial artery.  Appropriate arterial tracings confirmed on monitor.    Attempted on the R radial, unable to thread after three attempts.  Moved to the L radial, achieved in first attempt.     Complications/Tolerance None; patient tolerated the procedure well.   EBL Minimal   Specimen(s) None

## 2020-12-20 NOTE — Progress Notes (Signed)
[place] for IV heparin Indication: chest pain/ACS  Allergies  Allergen Reactions  . Other Other (See Comments)  . Succinylcholine Other (See Comments)    Pseudocholinesterase deficiency    Patient Measurements: Height: 5\' 3"  (160 cm) Weight: 97.9 kg (215 lb 13.3 oz) IBW/kg (Calculated) : 52.4 Heparin Dosing Weight: 73.7 kg  Vital Signs: Temp: 98.3 F (36.8 C) (03/28 1144) Temp Source: Oral (03/28 1144) BP: 116/61 (03/28 1800) Pulse Rate: 71 (03/28 1800)  Labs: Recent Labs    12/19/20 0532 12/19/20 0715 12/19/20 0724 12/19/20 0739 12/19/20 1154 12/19/20 1825 12/19/20 2010 12/19/20 2234 12/19/20 2302 12/19/20 2314 12/20/20 0202 12/20/20 0414 12/20/20 0848 12/20/20 0900 12/20/20 1511 12/20/20 1853  HGB 12.3  --    < >  --   --   --   --   --  9.5* 11.9* 10.9*  --   --   --   --   --   HCT 38.5  --    < >  --   --   --   --   --  28.0* 35.0* 33.6*  --   --   --   --   --   PLT 173  --   --   --   --   --   --   --   --   --  130*  --   --   --   --   --   APTT  --   --   --  31  --   --   --   --   --   --   --   --   --   --   --   --   LABPROT  --   --   --  24.7*  --   --   --   --   --   --   --   --   --   --   --   --   INR  --   --   --  2.3*  --   --   --   --   --   --   --   --   --   --   --   --   HEPARINUNFRC  --   --   --   --   --   --  0.10*  --   --   --   --   --   --  0.13*  --  0.21*  CREATININE 4.34*  --    < >  --   --    < >  --    < >  --  5.00* 5.13* 5.23* 4.93*  --  4.44*  --   TROPONINIHS  --  98,338*  --   --  >27,000*  --   --   --   --   --   --   --   --   --   --   --    < > = values in this interval not displayed.    Estimated Creatinine Clearance: 15.8 mL/min (A) (by C-G formula based on SCr of 4.44 mg/dL (H)).   Medical History: Past Medical History:  Diagnosis Date  . Asthma   . Complication of anesthesia    Psuedocholinerasterase deficiency per pt  . Diabetes mellitus    TYPE II   . Diabetic macular  edema ([place])    Pt gets injections in eyes with Eyelea  . Seasonal allergies     Medications:  Infusions:  . DOBUTamine 2.5 mcg/kg/min (12/20/20 1500)  . heparin 1,400 Units/hr (12/20/20 1500)    Assessment: 55 yo female admitted with complete heart block.  Suspicion for ACS, elevated troponins, but cath deferred due to elevated SCr.  Had temporary pacer placed this AM.  Heparin level is low at 0.21 (improved) however Heparin site is leaking so may actually be higher.   Goal of Therapy:  Heparin level 0.3-0.7 units/ml Monitor platelets by anticoagulation protocol: Yes   Plan:  -No change for now - Continue Heparin at 1400 units/hr -Recheck in 6 hrs  Sloan Leiter, PharmD, BCPS, BCCCP Clinical Pharmacist Please refer to [facility] for [place] numbers 12/20/2020, 8:05 PM

## 2020-12-20 NOTE — Progress Notes (Signed)
[place] for IV heparin Indication: chest pain/ACS  Allergies  Allergen Reactions  . Other Other (See Comments)  . Succinylcholine Other (See Comments)    Pseudocholinesterase deficiency    Patient Measurements: Height: 5\' 3"  (160 cm) Weight: 93 kg (205 lb) IBW/kg (Calculated) : 52.4 Heparin Dosing Weight: 73.7 kg  Vital Signs: Temp: 97.9 F (36.6 C) (03/27 1520) Temp Source: Oral (03/27 1520) BP: 120/72 (03/27 2320) Pulse Rate: 149 (03/27 2320)  Labs: Recent Labs    12/19/20 0532 12/19/20 0715 12/19/20 0724 12/19/20 0739 12/19/20 1154 12/19/20 1825 12/19/20 2010 12/19/20 2234 12/19/20 2302 12/19/20 2314  HGB 12.3  --  12.2  --   --   --   --   --  9.5* 11.9*  HCT 38.5  --  36.0  --   --   --   --   --  28.0* 35.0*  PLT 173  --   --   --   --   --   --   --   --   --   APTT  --   --   --  31  --   --   --   --   --   --   LABPROT  --   --   --  24.7*  --   --   --   --   --   --   INR  --   --   --  2.3*  --   --   --   --   --   --   HEPARINUNFRC  --   --   --   --   --   --  0.10*  --   --   --   CREATININE 4.34*  --  4.70*  --   --  4.94*  --  4.77*  --  5.00*  TROPONINIHS  --  75,829*  --   --  >27,000*  --   --   --   --   --     Estimated Creatinine Clearance: 13.6 mL/min (A) (by C-G formula based on SCr of 5 mg/dL (H)).   Medical History: Past Medical History:  Diagnosis Date  . Asthma   . Complication of anesthesia    Psuedocholinerasterase deficiency per pt  . Diabetes mellitus    TYPE II  . Diabetic macular edema ([facility])    Pt gets injections in eyes with Eyelea  . Seasonal allergies     Medications:  Infusions:  . calcium chloride  IV    . cangrelor 50 mg in NS 250 mL 0.75 mcg/kg/min (12/20/20 0039)  . dextrose 5% lactated ringers    . DOPamine 2.5 mcg/kg/min (12/19/20 2151)  . heparin 1,250 Units/hr (12/19/20 2200)  . insulin 7.5 Units/hr (12/20/20 0037)    Assessment: 55 yo female admitted  with complete heart block.  Suspicion for ACS, elevated troponins, but cath deferred due to elevated SCr.  Had temporary pacer placed this AM.  Pharmacy asked to dose V heparin.  No anticoagulants noted PTA.  She is noted on cangrelor -heparin level= 0.1  3/28 AM update:  Pt had to go back to cath lab for pacer exchange OK to re-start heparin and cangrelor per Dr. Virgina Jock   Goal of Therapy:  Heparin level 0.3-0.7 units/ml Monitor platelets by anticoagulation protocol: Yes   Plan:  -Re-start heparin at 950 units/hr -Heparin level at 0900  Narda Bonds, PharmD,  BCPS Clinical Pharmacist Phone: [phone] ;

## 2020-12-20 NOTE — Consult Note (Signed)
NAME:  Colleen [name], MRN:  [id number], DOB:  1965/12/12, LOS: 1 ADMISSION DATE:  12/19/2020, CONSULTATION DATE: 12/19/2020 REFERRING MD:  Vernell Leep, CHIEF COMPLAINT: Generalized weakness and loss of consciousness  History of Present Illness:  55 year old female with type 1 insulin-dependent diabetes who was transferred from [facility] long hospital after she presented with nausea vomiting and syncopal episode.  EKG was done which was concerning for complete AV block and ST depression in inferior leads, she was transferred under cardiology service for possible acute inferior wall STEMI and complete heart block requiring temporary pacemaker. Patient is getting medical management for NSTEMI due to acute kidney injury, PCCM was consulted for management of acute encephalopathy and insulin-dependent diabetes  Patient continued complaint of nausea denies vomiting, chest pain, fever, chills, headache  Pertinent  Medical History   Past Medical History:  Diagnosis Date  . Asthma   . Complication of anesthesia    Psuedocholinerasterase deficiency per pt  . Diabetes mellitus    TYPE II  . Diabetic macular edema (HCC)    Pt gets injections in eyes with Eyelea  . Seasonal allergies      Significant Hospital Events: Including procedures, antibiotic start and stop dates in addition to other pertinent events   . 3/27 admitted to ICU . 3/27 temporary pacemaker in place  Interim History / Subjective:  More awake today. Denies dyspnea or chest pain.   Objective   Blood pressure 106/82, pulse 70, temperature 98.3 F (36.8 C), temperature source Oral, resp. rate 17, height 5\' 3"  (1.6 m), weight 97.9 kg, SpO2 96 %.        Intake/Output Summary (Last 24 hours) at 12/20/2020 1234 Last data filed at 12/20/2020 1144 Gross per 24 hour  Intake 2884.76 ml  Output 430 ml  Net 2454.76 ml   Filed Weights   12/19/20 0745 12/20/20 0455  Weight: 93 kg 97.9 kg    Examination:   Physical  exam: General: obese female, lying on the bed. No distress.  HEENT: [place]/AT, eyes anicteric. Severely dry mucous membranes Neuro: somnolent but conversant and able to move all limbs.  Chest: normal vesicular breath sounds throughout.  Heart: Bradycardic, paced rhythm, no murmurs or gallops. Underlying rhythm is complete heart block.  Abdomen: Soft, nontender, nondistended, bowel sounds present Skin: No rash  Labs/imaging that I havepersonally reviewed   CXR shows bilateral interstitial infiltrates ScvO2 is marginal at 57.2 Glucose has normalized on Endo insulin infusion.   Echocardiogram shows inferolateral hypokinesis and at least moderate RV dysfunction  Resolved Hospital Problem list     Assessment & Plan:   Critically illl due to inferior wall STEMI complicated by RV infarction and Complete heart block requiring temporary pacing.  Acute kidney injury on background of Stage iii CKD from diabetic nephropathy. Shock liver vs congestive hepatopathy.  Acute metabolic encephalopathy Critically ill due to DKA due to Insulin-dependent diabetes type 1 requiring insulin infusion.  Obesity  Plan:  - Continue TVPM, CHB should improve over time and revascularization.  - Currently appears to have adequate BP to perfuse kidneys, but marginal ScvO2 suggests there still might be a benefit of inotropic support to help renal perfusion. - Discussed with cardiology - will convert introducer to HD catheter and initiate HD for fluid removal. Decreasing venous congestion may help with renal function.  - Transition back to sliding scale insulin. - Follow renal function - Plan for cardiac cath once on RRT support, creatinine improving.  - Secondary prevention: hold on beta-blocker for  now.   Best practice (right click and "Reselect all SmartList Selections" daily)  Diet:  Oral Pain/Anxiety/Delirium protocol (if indicated): No VAP protocol (if indicated): Not indicated DVT prophylaxis: Systemic  AC GI prophylaxis: N/A Glucose control:  SSI Yes and Basal insulin Yes Central venous access:  N/A Arterial line:  N/A Foley:  N/A Mobility:  bed rest  PT consulted: Yes Last date of multidisciplinary goals of care discussion [Per primary team] Code Status: Full code Disposition: ICU   Labs   CBC: Recent Labs  Lab 12/19/20 0532 12/19/20 0724 12/19/20 2302 12/19/20 2314 12/20/20 0202  WBC 11.7*  --   --   --  13.0*  HGB 12.3 12.2 9.5* 11.9* 10.9*  HCT 38.5 36.0 28.0* 35.0* 33.6*  MCV 93.7  --   --   --  91.8  PLT 173  --   --   --  130*    Basic Metabolic Panel: Recent Labs  Lab 12/19/20 1825 12/19/20 2234 12/19/20 2302 12/19/20 2314 12/20/20 0202 12/20/20 0414 12/20/20 0848  NA 133* 130* 136 131* 133* 133* 133*  K 5.3* 5.9* 4.8 6.1* 4.4 4.3 4.3  CL 101 104  --  103 102 102 102  CO2 15* 13*  --   --  14* 14* 20*  GLUCOSE 318* 307*  --  318* 293* 242* 208*  BUN 78* 81*  --  93* 85* 91* 90*  CREATININE 4.94* 4.77*  --  5.00* 5.13* 5.23* 4.93*  CALCIUM 8.3* 7.7*  --   --  7.8* 7.9* 8.8*  PHOS  --   --   --   --   --  7.1*  --    GFR: Estimated Creatinine Clearance: 14.2 mL/min (A) (by C-G formula based on SCr of 4.93 mg/dL (H)). Recent Labs  Lab 12/19/20 0532 12/20/20 0202  WBC 11.7* 13.0*    Liver Function Tests: Recent Labs  Lab 12/19/20 0532 12/19/20 1825 12/20/20 0414 12/20/20 0415  AST 3,285* 5,434*  --  4,893*  ALT 1,814* 2,839*  --  2,931*  ALKPHOS 139* 124  --  114  BILITOT 2.4* 2.5*  --  2.0*  PROT 7.0 6.0*  --  5.6*  ALBUMIN 4.0 3.5 3.4* 3.4*   Recent Labs  Lab 12/19/20 0532  LIPASE 20   No results for input(s): AMMONIA in the last 168 hours.  ABG    Component Value Date/Time   PHART 7.306 (L) 12/19/2020 2302   PCO2ART 22.4 (L) 12/19/2020 2302   PO2ART 119 (H) 12/19/2020 2302   HCO3 11.2 (L) 12/19/2020 2302   TCO2 15 (L) 12/19/2020 2314   ACIDBASEDEF 14.0 (H) 12/19/2020 2302   O2SAT 57.2 12/20/2020 1121     Coagulation  Profile: Recent Labs  Lab 12/19/20 0739  INR 2.3*    Cardiac Enzymes: No results for input(s): CKTOTAL, CKMB, CKMBINDEX, TROPONINI in the last 168 hours.  HbA1C: Hgb A1c MFr Bld  Date/Time Value Ref Range Status  12/19/2020 11:54 AM 7.8 (H) 4.8 - 5.6 % Final    Comment:    (NOTE) Pre diabetes:          5.7%-6.4%  Diabetes:              >6.4%  Glycemic control for   <7.0% adults with diabetes     CBG: Recent Labs  Lab 12/20/20 0747 12/20/20 0849 12/20/20 0949 12/20/20 1047 12/20/20 1141  GLUCAP 178* 195* 201* 190* 173*   CRITICAL CARE Performed by: Kipp Brood  Total critical care time: 45 minutes  Critical care time was exclusive of separately billable procedures and treating other patients.  Critical care was necessary to treat or prevent imminent or life-threatening deterioration.  Critical care was time spent personally by me on the following activities: development of treatment plan with patient and/or surrogate as well as nursing, discussions with consultants, evaluation of patient's response to treatment, examination of patient, obtaining history from patient or surrogate, ordering and performing treatments and interventions, ordering and review of laboratory studies, ordering and review of radiographic studies, pulse oximetry, re-evaluation of patient's condition and participation in multidisciplinary rounds.  Kipp Brood, MD [facility] ICU Physician [place]  Pager: [phone] Mobile: [phone] After hours: [phone].

## 2020-12-20 NOTE — Progress Notes (Signed)
Inpatient Diabetes Program Recommendations  AACE/ADA: New Consensus Statement on Inpatient Glycemic Control   Target Ranges:  Prepandial:   less than 140 mg/dL      Peak postprandial:   less than 180 mg/dL (1-2 hours)      Critically ill patients:  140 - 180 mg/dL   Results for Colleen [name], Colleen [name] (MRN [id number]) as of 12/20/2020 11:15  Ref. Range 12/20/2020 01:34 12/20/2020 03:00 12/20/2020 04:08 12/20/2020 05:00 12/20/2020 06:01 12/20/2020 06:47 12/20/2020 07:47 12/20/2020 08:49 12/20/2020 09:49 12/20/2020 10:47  Glucose-Capillary Latest Ref Range: 70 - 99 mg/dL 293 (H) 262 (H) 249 (H) 222 (H) 181 (H) 212 (H) 178 (H) 195 (H) 201 (H) 190 (H)  Results for Colleen [name], Colleen [name] (MRN [id number]) as of 12/20/2020 11:15  Ref. Range 12/19/2020 11:54  Hemoglobin A1C Latest Ref Range: 4.8 - 5.6 % 7.8 (H)   Review of Glycemic Control  Diabetes history: DM1 Outpatient Diabetes medications: Medtronic Insulin Pump 670G with Humalog insulin Current orders for Inpatient glycemic control: IV insulin  Inpatient Diabetes Program Recommendations:    Insulin: Once acidosis is completely cleared and provider ready to transition, please consider ordering Lantus 30 units Q24H, CBGs Q4H, Novolog 0-9 units Q4H, and if patient will be eating Novolog 3 units TID with meals for meal coverage if patient eats at least 50% of meals.  NOTE: Diabetes Coordinator working remotely. Called patient over her cell phone to discuss DM and insulin pump. Patient reports that she sees Colleen [name] (Endocrinologist) and that she uses an insulin pump for DM management. Patient reports that she removed her insulin pump yesterday at the hospital before procedure and it was given to her husband. Patient is not aware of any of her insulin pump settings. Patient reports that she has been on the Medtronic insulin pump for 3 1/2 years and that she works with Colleen [name] and Colleen [name] (CDE) at Dr. Almetta [name] office regarding insulin pump adjustments. Patient last seen  Colleen [name] on 10/15/20 and no insulin pump changes were made at that time. Patient reports that she uses the Medtronic Guardian (CGM) with her insulin pump and also uses the Auto Mode. Patient reports that glucose usually runs fairly well and A1C is usually in the 7% range. Patient has hypoglycemia about once a week, typically at night after a busy day.  Discussed that IV insulin is being used currently and that labs are being monitored as well to ensure acidosis is improving. Explained to patient that once DKA is cleared completely, that she will be given basal and bolus insulin. Patient states she has used Lantus in the past. Informed patient that I would call Dr. Almetta [name] office to get insulin pump settings. Patient verbalized understanding of information discussed and states that she has no questions or concerns at this time related to DM. Called Dr. Almetta [name] office regarding patient's insulin pump settings. Had to leave a message and they will have the nurse call me back with pump settings.  Received a return call from Colleen [name] at 13:15 regarding insulin pump settings. Was informed that patient's hourly basal rates vary from 1.5-1.6 units per hour, insulin sensitivity 1:40 (1 unit drops glucose 40 mg/dl), and insulin to carb ratio ranges from 1:8-10 (1 unit for every 8-10 grams of carbs). Colleen [name] notes that patient has not had any pump setting adjustments in a while and that patient's last A1C was 6%. Colleen [name] recommended that SQ insulin be used while in the hospital and that patient not resume her  insulin pump until she is home and stable.   Thanks, Colleen Alderman, RN, MSN, CDE Diabetes Coordinator Inpatient Diabetes Program [phone] (Team Pager from 8am to 5pm)

## 2020-12-20 NOTE — Progress Notes (Signed)
[name] Progress Note   Assessment/ Plan: 1. AKI on CKD 3 - baseline creat 1.2- 1.4 from dec 2021, CKD stage 3.  Creat here 4.0 in setting of acute MI and several days of nausea/ vomiting compounded by cardiogenic shock.  On exam no vol excess and CXR clear. Renal US w/o obstruction. UA unremarkable.  No current indications for RRT but I anticipate in the next 24-48h she may develop.  Discussed with her today.   Trend labs and follow closely.  2. Acute MI - trop 75K, per cardiology suspecting RV infarct. Holding off on heart cath for now given AKI. 3. Shock - cardiogenic from #4, now paced; also rec'd volume support. Currently not requiring any vasopressors.  4. Complete AV block - has temp pacemaker placed with malfunction overnight requiring TC pacing but now back to TV; per cardiology.  5. DM type 1 complicated by DKA now- on pump at home, not on insulin gtt here per PCCM. 6. Hepatitis:  AST ~4900 ALT ~2900; likely secondary to shock. Trending.    Colleen Hick MD 12/20/2020, 8:17 AM  [name] Pager: [phone] _________________________________________________________  Background on initial C/S: HPI: The patient is a 55 y.o. year-old w/ hx of T1DM on insulin pump, hx mild CAD presented w/ 3d hx N/V then had syncopal episode. In ED EKG showed inf ST elevation. Cardiology consulted for possible AV block.  Creat 4.0.  Pt went to cath lab for temp pacemaker today. Asked to see for renal failure.   Pt seen in ICU.  Pt is f/u [name], per husband she has stage 3 CKD, baseline creat around 1.3.  Gives hx of N/V for 2-3 days. Denies CP. Has neuropathic LE's, per husband most of the L lower leg is "made of metal". No active SOB or cough, no abd pain. No fevers.   Subjective:   Overnight required redo of TV pacer and required TC pacing - had hypotension with opioid for pain related to pacing.  On insulin gtt now for DKA noted overnight too.  Hyperkalemia  6.1 resolved with insulin.  This AM in 4s.   Pt only c/o this AM is uncomfortable due to lying on back for femoral sheath with TV pacer.  No dyspnea, nausea.  I/Os yest 2.1 / 0.31 UOP, today 7mL UOP as of 9am.   Objective Vitals:   12/20/20 0600 12/20/20 0700 12/20/20 0800 12/20/20 0809  BP: 137/72 (!) 140/115 (!) 145/105 (!) 151/68  Pulse: 71 70 70 70  Resp: 18 14 15 11   Temp:    98 F (36.7 C)  TempSrc:    Oral  SpO2: 96% 95% 97% 96%  Weight:      Height:       Physical Exam General: lying flat in bed on RA Heart: paced in 70s, no rub Lungs: clear, normal WOB Abdomen:soft NABS Extremities: no edema  Dialysis Access: none GU:  Foley with amber urine, small volume  Additional Objective Labs: Basic Metabolic Panel: Recent Labs  Lab 12/19/20 2234 12/19/20 2302 12/19/20 2314 12/20/20 0202 12/20/20 0414  NA 130*   < > 131* 133* 133*  K 5.9*   < > 6.1* 4.4 4.3  CL 104  --  103 102 102  CO2 13*  --   --  14* 14*  GLUCOSE 307*  --  318* 293* 242*  BUN 81*  --  93* 85* 91*  CREATININE 4.77*  --  5.00* 5.13* 5.23*  CALCIUM 7.7*  --   --  7.8* 7.9*  PHOS  --   --   --   --  7.1*   < > = values in this interval not displayed.   Liver Function Tests: Recent Labs  Lab 12/19/20 0532 12/19/20 1825 12/20/20 0414 12/20/20 0415  AST 3,285* 5,434*  --  4,893*  ALT 1,814* 2,839*  --  2,931*  ALKPHOS 139* 124  --  114  BILITOT 2.4* 2.5*  --  2.0*  PROT 7.0 6.0*  --  5.6*  ALBUMIN 4.0 3.5 3.4* 3.4*   Recent Labs  Lab 12/19/20 0532  LIPASE 20   CBC: Recent Labs  Lab 12/19/20 0532 12/19/20 0724 12/19/20 2302 12/19/20 2314 12/20/20 0202  WBC 11.7*  --   --   --  13.0*  HGB 12.3   < > 9.5* 11.9* 10.9*  HCT 38.5   < > 28.0* 35.0* 33.6*  MCV 93.7  --   --   --  91.8  PLT 173  --   --   --  130*   < > = values in this interval not displayed.   Blood Culture No results found for: SDES, SPECREQUEST, CULT, REPTSTATUS  Cardiac Enzymes: No results for input(s):  CKTOTAL, CKMB, CKMBINDEX, TROPONINI in the last 168 hours. CBG: Recent Labs  Lab 12/20/20 0408 12/20/20 0500 12/20/20 0601 12/20/20 0647 12/20/20 0747  GLUCAP 249* 222* 181* 212* 178*   Iron Studies: No results for input(s): IRON, TIBC, TRANSFERRIN, FERRITIN in the last 72 hours. @lablastinr3 @ Studies/Results: DG Chest 1 View  Result Date: 12/20/2020 CLINICAL DATA:  Transvenous pacemaker placement EXAM: CHEST  1 VIEW COMPARISON:  09/25/2011 FINDINGS: Inferiorly approaching trans venous pacemaker lead is seen overlying the expected right ventricle. Lung volumes are small, but are symmetric and are clear. Right internal jugular central venous catheter tip overlies the expected innominate vein. No pneumothorax or pleural effusion. Cardiac size is within normal limits. Pulmonary vascularity is normal. No acute bone abnormality. IMPRESSION: Inferiorly approaching trans venous pacemaker lead overlies the expected right ventricle. Electronically Signed   By: Fidela Salisbury MD   On: 12/20/2020 00:03   DG Chest 1 View  Result Date: 12/19/2020 CLINICAL DATA:  Encounter for central line placement. Verification of temporary pacing lead location. EXAM: CHEST  1 VIEW COMPARISON:  Radiograph earlier today. FINDINGS: Single fluoroscopic spot view of the lower chest is obtained. The right internal jugular central catheter on prior radiograph is not seen on this view. There multiple overlying monitoring devices. Presumed pacing lead from inferior projects over the expected location of the central/right heart. IMPRESSION: Single fluoroscopic spot view of the lower chest. Pacing lead is tentatively visualized projecting over the central/right heart. Electronically Signed   By: Keith Rake M.D.   On: 12/19/2020 22:07   CT Head Wo Contrast  Result Date: 12/19/2020 CLINICAL DATA:  Confusion.  Recent fall EXAM: CT HEAD WITHOUT CONTRAST TECHNIQUE: Contiguous axial images were obtained from the base of the skull  through the vertex without intravenous contrast. COMPARISON:  03/22/2017 FINDINGS: Brain: No evidence of infarction, hemorrhage, hydrocephalus, extra-axial collection or mass lesion/mass effect. Vascular: Negative Skull: Negative for fracture Sinuses/Orbits: Bilateral cataract resection. Other: These results were called by telephone at the time of interpretation on 12/19/2020 at 8:31 am to provider MELANIE BELFI , who verbally acknowledged these results. IMPRESSION: Normal appearance of the brain. Electronically Signed   By: Monte Fantasia M.D.   On: 12/19/2020 08:31   CARDIAC CATHETERIZATION  Addendum Date: 12/19/2020   Successful  temporary pacemaker placement. Nigel Mormon, MD Pager: [phone] Office: [phone]   Result Date: 12/19/2020 Successful temporary pacemaker placement. Nigel Mormon, MD Pager: [phone] Office: [phone]   CARDIAC CATHETERIZATION  Result Date: 12/19/2020 Successful temporary pacemaker placement Nigel Mormon, MD Pager: [phone] Office: [phone]   US RENAL  Result Date: 12/19/2020 CLINICAL DATA:  55 year old female with acute kidney injury. EXAM: RENAL / URINARY TRACT ULTRASOUND COMPLETE COMPARISON:  10/15/2016 MR FINDINGS: Right Kidney: Renal measurements: 8.9 x 4.3 x 5.5 cm = volume: 109 mL. Echogenicity within normal limits. No solid mass or hydronephrosis visualized. A 2 cm cyst is noted. Left Kidney: Renal measurements: 9.8 x 4.7 x 4.7 cm = volume: 112 mL. Echogenicity within normal limits. No solid mass or hydronephrosis visualized. Two cysts are noted. Bladder: Appears normal for degree of bladder distention. Other: None. IMPRESSION: Unremarkable renal ultrasound except for scattered renal cysts. No hydronephrosis. Electronically Signed   By: Margarette Canada M.D.   On: 12/19/2020 14:41   DG Chest Port 1 View  Result Date: 12/19/2020 CLINICAL DATA:  Temporary cardiac pacemaker placement EXAM: PORTABLE CHEST 1 VIEW COMPARISON:   08/26/2015 FINDINGS: UPPER limits normal heart size again noted. A lead extending from the abdomen is noted with tip overlying the mid-LEFT heart. A RIGHT IJ central venous catheter sheath is present with tip overlying the mid SVC. There is no evidence of focal airspace disease, pulmonary edema, suspicious pulmonary nodule/mass, pleural effusion, or pneumothorax. No acute bony abnormalities are identified. IMPRESSION: Lead extending from the abdomen overlying the mid-LEFT heart. No pneumothorax. RIGHT IJ central venous catheter sheath. Electronically Signed   By: Margarette Canada M.D.   On: 12/19/2020 11:33   DG C-Arm 1-60 Min  Result Date: 12/19/2020 CLINICAL DATA:  Verification of temporary pacing lead location. EXAM: DG C-ARM 1-60 MIN FLUOROSCOPY TIME:  Fluoroscopy Time:  179 seconds Radiation Exposure Index (if provided by the fluoroscopic device): 41.45 mGy Number of Acquired Spot Images: 1 COMPARISON:  Radiograph earlier this day. FINDINGS: Single fluoroscopic spot view of the lower chest is obtained. The right internal jugular central catheter on prior radiograph is not seen on this view. There multiple overlying monitoring devices. Presumed pacing lead from inferior projects over the expected location of the central/right heart. IMPRESSION: Single fluoroscopic spot view of the lower chest. Pacing lead is tentatively visualized projecting over the central/right heart. Electronically Signed   By: Keith Rake M.D.   On: 12/19/2020 22:15   ECHOCARDIOGRAM COMPLETE  Result Date: 12/19/2020    ECHOCARDIOGRAM LIMITED REPORT   Patient Name:   [name] Date of Exam: 12/19/2020 Medical Rec #:  [id number]        Height:       63.0 in Accession #:    [id number]       Weight:       205.0 lb Date of Birth:  1965/10/03        BSA:          1.954 m Patient Age:    49 years         BP:           116/76 mmHg Patient Gender: F                HR:           60 bpm. Exam Location:  Inpatient Procedure: 2D Echo and  Intracardiac Opacification Agent Indications:     abnormal ecg  History:  Patient has no prior history of Echocardiogram examinations.                  Arrythmias:complete AV block; Risk Factors:Diabetes and                  Hypertension.  Sonographer:     Johny Chess RDCS Referring Phys:  [id number] [facility] J PATWARDHAN Diagnosing Phys: Vernell Leep MD IMPRESSIONS  1. Left ventricular ejection fraction, by estimation, is 55 to 60%. The left ventricle has normal function. The left ventricle demonstrates regional wall motion abnormalities (see scoring diagram/findings for description). Left ventricular diastolic parameters are indeterminate. There is mild hypokinesis of the left ventricular, basal inferolateral wall.  2. Right ventricular systolic function is moderately reduced. The right ventricular size is moderately enlarged. Estimated PASP 33 mmHg.  3. Right atrial size was mildly dilated.  4. Tricuspid valve regurgitation is severe.  5. The inferior vena cava is normal in size with <50% respiratory variability, suggesting right atrial pressure of 8 mmHg.  6. Comapared to previous study report in 2018, LV/RV wall motion abnormalities are new. FINDINGS  Left Ventricle: Left ventricular ejection fraction, by estimation, is 55 to 60%. The left ventricle has normal function. The left ventricle demonstrates regional wall motion abnormalities. Mild hypokinesis of the left ventricular, basal inferolateral wall. Definity contrast agent was given IV to delineate the left ventricular endocardial borders. Abnormal (paradoxical) septal motion, consistent with RV pacemaker. Left ventricular diastolic parameters are indeterminate. Right Ventricle: The right ventricular size is moderately enlarged. No increase in right ventricular wall thickness. Right ventricular systolic function is moderately reduced. There is moderately elevated pulmonary artery systolic pressure. The tricuspid  regurgitant velocity is 2.39  m/s, and with an assumed right atrial pressure of 8 mmHg, the estimated right ventricular systolic pressure is 55.7 mmHg. Left Atrium: Left atrial size was normal in size. Right Atrium: Right atrial size was mildly dilated. Pericardium: There is no evidence of pericardial effusion. Mitral Valve: The mitral valve is grossly normal. Tricuspid Valve: The tricuspid valve is grossly normal. Tricuspid valve regurgitation is severe. Aortic Valve: The aortic valve is grossly normal. Aortic valve regurgitation is not visualized. Pulmonic Valve: The pulmonic valve was grossly normal. Pulmonic valve regurgitation is not visualized. Aorta: The aortic root and ascending aorta are structurally normal, with no evidence of dilitation. Venous: The inferior vena cava is normal in size with less than 50% respiratory variability, suggesting right atrial pressure of 8 mmHg. Additional Comments: A device lead is visualized in the inferior vena cava. LEFT VENTRICLE PLAX 2D LVIDd:         4.40 cm LVIDs:         3.50 cm LV PW:         1.00 cm LV IVS:        1.00 cm LVOT diam:     1.60 cm LVOT Area:     2.01 cm  RIGHT VENTRICLE         IVC TAPSE (M-mode): 1.2 cm  IVC diam: 2.10 cm LEFT ATRIUM             Index       RIGHT ATRIUM           Index LA diam:        2.70 cm 1.38 cm/m  RA Area:     16.40 cm LA Vol (A2C):   38.6 ml 19.75 ml/m RA Volume:   47.30 ml  24.21 ml/m LA Vol (A4C):  37.5 ml 19.19 ml/m LA Biplane Vol: 40.2 ml 20.57 ml/m   AORTA Ao Root diam: 3.10 cm Ao Asc diam:  3.30 cm TRICUSPID VALVE TR Peak grad:   22.8 mmHg TR Vmax:        239.00 cm/s  SHUNTS Systemic Diam: 1.60 cm Manish Patwardhan MD Electronically signed by Vernell Leep MD Signature Date/Time: 12/19/2020/4:19:20 PM    Final    Medications: . cangrelor 50 mg in NS 250 mL 0.75 mcg/kg/min (12/20/20 0600)  . dextrose 5% lactated ringers 125 mL/hr at 12/20/20 0452  . DOPamine 2.5 mcg/kg/min (12/19/20 2151)  . heparin 1,250 Units/hr (12/20/20 0600)  .  insulin 4.8 Units/hr (12/20/20 0749)   . alum & mag hydroxide-simeth  30 mL Oral Once  . aspirin EC  81 mg Oral Daily  . Chlorhexidine Gluconate Cloth  6 each Topical Daily  . fentaNYL (SUBLIMAZE) injection  25 mcg Intravenous Once  . sodium chloride flush  10-40 mL Intracatheter Q12H

## 2020-12-20 NOTE — Progress Notes (Incomplete)
ANTICOAGULATION CONSULT NOTE - Initial Consult  Pharmacy Consult for IV heparin Indication: chest pain/ACS  Allergies  Allergen Reactions  . Other Other (See Comments)  . Succinylcholine Other (See Comments)    Pseudocholinesterase deficiency    Patient Measurements: Height: 5\' 3"  (160 cm) Weight: 97.9 kg (215 lb 13.3 oz) IBW/kg (Calculated) : 52.4 Heparin Dosing Weight: 73.7 kg  Vital Signs: Temp: 98 F (36.7 C) (03/28 0809) Temp Source: Oral (03/28 0809) BP: 151/68 (03/28 0809) Pulse Rate: 70 (03/28 0809)  Labs: Recent Labs    12/19/20 0532 12/19/20 0715 12/19/20 0724 12/19/20 0739 12/19/20 1154 12/19/20 1825 12/19/20 2010 12/19/20 2234 12/19/20 2302 12/19/20 2314 12/20/20 0202 12/20/20 0414 12/20/20 0848 12/20/20 0900  HGB 12.3  --    < >  --   --   --   --   --  9.5* 11.9* 10.9*  --   --   --   HCT 38.5  --    < >  --   --   --   --   --  28.0* 35.0* 33.6*  --   --   --   PLT 173  --   --   --   --   --   --   --   --   --  130*  --   --   --   APTT  --   --   --  31  --   --   --   --   --   --   --   --   --   --   LABPROT  --   --   --  24.7*  --   --   --   --   --   --   --   --   --   --   INR  --   --   --  2.3*  --   --   --   --   --   --   --   --   --   --   HEPARINUNFRC  --   --   --   --   --   --  0.10*  --   --   --   --   --   --  0.13*  CREATININE 4.34*  --    < >  --   --    < >  --    < >  --  5.00* 5.13* 5.23* 4.93*  --   TROPONINIHS  --  83,419*  --   --  >27,000*  --   --   --   --   --   --   --   --   --    < > = values in this interval not displayed.    Estimated Creatinine Clearance: 14.2 mL/min (A) (by C-G formula based on SCr of 4.93 mg/dL (H)).   Medical History: Past Medical History:  Diagnosis Date  . Asthma   . Complication of anesthesia    Psuedocholinerasterase deficiency per pt  . Diabetes mellitus    TYPE II  . Diabetic macular edema (HCC)    Pt gets injections in eyes with [facility]  . Seasonal allergies      Medications:  Infusions:  . cangrelor 50 mg in NS 250 mL Stopped (12/20/20 0841)  . dextrose 5% lactated ringers 125 mL/hr at 12/20/20 0452  . DOPamine 2.5 mcg/kg/min (  12/19/20 2151)  . heparin 1,250 Units/hr (12/20/20 0600)  . insulin 7.5 Units/hr (12/20/20 1761)    Assessment: 55 yo female admitted with complete heart block.  Suspicion for ACS, elevated troponins, but cath deferred due to elevated SCr.  Had temporary pacer placed this AM.  Pharmacy asked to dose IV heparin.  No anticoagulants noted PTA d/t history of GI bleed.  She is also on cangrelor -heparin level= 0.13  Goal of Therapy:  Heparin level 0.3-0.7 units/ml Monitor platelets by anticoagulation protocol: Yes   Plan:  -Increase heparin to 1,400 units/hr -Check heparin level and CBC in am  Mercy Riding, PharmD PGY1 Acute Care Pharmacy Resident Please refer to [facility] for unit-specific pharmacist

## 2020-12-20 NOTE — Progress Notes (Signed)
Subjective: Continues to have waxing and waning episodes of sleepiness.  Blood pressure stable.  Negligible urine output. Nephrology and critical care following.  Had to replace temporary pacemaker on 12/20/2018 14-90 because of displacement.  For episodes of pauses noted on telemetry, longest 9.4 seconds with P waves without pacemaker spikes, last at 6:46 AM. No pauses since then.  Objective:  Vital Signs in the last 24 hours: Temp:  [97.9 F (36.6 C)-98.1 F (36.7 C)] 98 F (36.7 C) (03/28 0809) Pulse Rate:  [0-281] 70 (03/28 1030) Resp:  [0-51] 15 (03/28 1030) BP: (32-156)/(23-115) 135/73 (03/28 1030) SpO2:  [0 %-100 %] 96 % (03/28 1030) Arterial Line BP: (110-178)/(41-80) 177/80 (03/28 1030) Weight:  [97.9 kg] 97.9 kg (03/28 0455)  Intake/Output from previous day: 03/27 0701 - 03/28 0700 In: 2111.3 [P.O.:480; I.V.:773.7; IV Piggyback:857.6] Out: 310 [Urine:310]  Physical Exam Vitals and nursing note reviewed.  Constitutional:      General: She is not in acute distress.    Appearance: She is well-developed. She is ill-appearing.  HENT:     Head: Normocephalic and atraumatic.  Eyes:     Conjunctiva/sclera: Conjunctivae normal.     Pupils: Pupils are equal, round, and reactive to light.  Neck:     Vascular: No JVD.  Cardiovascular:     Rate and Rhythm: Normal rate and regular rhythm.     Pulses: Normal pulses and intact distal pulses.     Heart sounds: No murmur heard.   Pulmonary:     Effort: Pulmonary effort is normal.     Breath sounds: Normal breath sounds. No wheezing or rales.  Abdominal:     General: Bowel sounds are normal.     Palpations: Abdomen is soft.     Tenderness: There is no rebound.  Musculoskeletal:        General: No tenderness. Normal range of motion.     Right lower leg: No edema.     Left lower leg: No edema.  Lymphadenopathy:     Cervical: No cervical adenopathy.  Skin:    General: Skin is warm and dry.  Neurological:     Mental  Status: She is alert and oriented to person, place, and time.     Cranial Nerves: No cranial nerve deficit.     Comments: Alert, awake, oriented x3.  However, has periods of somnolence.      Lab Results: BMP Recent Labs    12/20/20 0202 12/20/20 0414 12/20/20 0848  NA 133* 133* 133*  K 4.4 4.3 4.3  CL 102 102 102  CO2 14* 14* 20*  GLUCOSE 293* 242* 208*  BUN 85* 91* 90*  CREATININE 5.13* 5.23* 4.93*  CALCIUM 7.8* 7.9* 8.8*  GFRNONAA 9* 9* 10*    CBC Recent Labs  Lab 12/20/20 0202  WBC 13.0*  RBC 3.66*  HGB 10.9*  HCT 33.6*  PLT 130*  MCV 91.8  MCH 29.8  MCHC 32.4  RDW 12.9    HEMOGLOBIN A1C Lab Results  Component Value Date   HGBA1C 7.8 (H) 12/19/2020   MPG 177.16 12/19/2020    Cardiac Panel (last 3 results) Results for TREMEKA, HELBLING (MRN [id number]) as of 12/20/2020 11:29  Ref. Range 12/19/2020 07:15 12/19/2020 11:54  Troponin I (High Sensitivity) Latest Ref Range: <18 ng/L 75,829 (HH) >27,000 (HH)   BNP (last 3 results) N/A  TSH N/A  Lipid Panel     Component Value Date/Time   CHOL 120 12/20/2020 0415   CHOL 164 10/08/2019 0901  TRIG 96 12/20/2020 0415   HDL 59 12/20/2020 0415   HDL 99 10/08/2019 0901   CHOLHDL 2.0 12/20/2020 0415   VLDL 19 12/20/2020 0415   LDLCALC 42 12/20/2020 0415   LDLCALC 54 10/08/2019 0901   LDLDIRECT 61 10/08/2019 0901     Hepatic Function Panel Recent Labs    12/19/20 0532 12/19/20 1825 12/20/20 0414 12/20/20 0415  PROT 7.0 6.0*  --  5.6*  ALBUMIN 4.0 3.5 3.4* 3.4*  AST 3,285* 5,434*  --  4,893*  ALT 1,814* 2,839*  --  2,931*  ALKPHOS 139* 124  --  114  BILITOT 2.4* 2.5*  --  2.0*  BILIDIR  --   --   --  0.5*  IBILI  --   --   --  1.5*    Imaging: Chest x-ray 12/12/2020: Trachea is midline. Heart is enlarged, stable. Thoracic aorta is calcified. Right IJ central line tip is in the brachiocephalic vein junction region. Transvenous pacemaker is seen from an IVC approach with tip projecting over  the right ventricle. Defect related pads are in place.  Lungs are somewhat low in volume with minimal bibasilar subsegmental atelectasis. No pleural fluid.  IMPRESSION: Low lung volumes with bibasilar subsegmental atelectasis.  CT head 12/19/2020: Normal appearance of the brain.  Cardiac Studies:  EKG 12/20/2020: Sinus rhythm with complete AV block.   Asystole without ventricular escape rhythm. Two ventricular pacing complexes seen.  Echocardiogram 12/19/2020: 1. Left ventricular ejection fraction, by estimation, is 55 to 60%. The  left ventricle has normal function. The left ventricle demonstrates  regional wall motion abnormalities (see scoring diagram/findings for  description). Left ventricular diastolic  parameters are indeterminate. There is mild hypokinesis of the left  ventricular, basal inferolateral wall.  2. Right ventricular systolic function is moderately reduced. The right  ventricular size is moderately enlarged. Estimated PASP 33 mmHg.  3. Right atrial size was mildly dilated.  4. Tricuspid valve regurgitation is severe.  5. The inferior vena cava is normal in size with <50% respiratory  variability, suggesting right atrial pressure of 8 mmHg.  6. Comapared to previous study report in 2018, LV/RV wall motion  abnormalities are new.   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  Inferior STEMI: Primarily RV infarct with moderate RV dilatation and dysfunction.  High-sensitivity troponin > 27,000 Currently chest pain-free.  Hemodynamically stable. Continue aspirin, heparin. Holding statin due to liver enzyme elevation. Holding beta-blocker, ACE due to complete heart block and AKI. Treated with Cangrelor overnight, now stopped.  I do not think there is any further benefit in continuing Cangrelor  given her low presentation.  After acute illness is over, and decision is made regarding permanent pacemaker placement or not, could add Brilinta.  RV failure: Fortunately, maintaining blood pressure. Severe liver enzyme elevation. Conservative management.  Do not think she needs inotropes at this time. Given her RV dilatation, I would not add any volume at this time.  Complete AV block: Ischemic in etiology. Currently has temporary transvenous pacemaker placed through right common femoral vein. Unable to advance through RIJ due to large RA/RV.  Will need to continue for next few days, will be await any potential recovery of AV node. Required replacement due to dislodgment on 12/20/2020.  Strict bed rest and knee immobilization.  Consulted EP to consider temporary permanent pacemaker Patient had episodes of asystole  without ventricular escape rhythm and no pacemaker spikes since this morning. I suspect if this was accidental device disconnection or malfunction. Currently, pacemaker is well-positioned on chest x-ray.  Capturing well at heart rate 70 and 10 mg.  Should not last even at 2.5 mg.  I did not reduce about lower than left.  I have not made any further adjustments to the pacemaker. Appreciate EP input today.  AKI: Remains anuric. Nephrology anticipate RRT in next 24-48 hrs  Type 1 DM:  High risk for DKA. Appreciate CCM management for her diabetes.  CRITICAL CARE Performed by: Vernell Leep   Total critical care time: 45 minutes   Critical care time was exclusive of separately billable procedures and treating other patients.   Critical care was necessary to treat or prevent imminent or life-threatening deterioration.   Critical care was time spent personally by me on the following activities: development of treatment plan with patient and/or surrogate as well as nursing, discussions with consultants, evaluation of patient's response to treatment, examination of patient,  obtaining history from patient or surrogate, ordering and performing treatments and interventions, ordering and review of laboratory studies, ordering and review of radiographic studies, pulse oximetry and re-evaluation of patient's condition.      Ma.nish Esther Hardy, MD Pager: [phone] Office: [phone]

## 2020-12-20 NOTE — Progress Notes (Signed)
[facility] Progress Note Patient Name: Colleen [name] DOB: Jul 15, 1966 MRN: [id number]   Date of Service  12/20/2020  HPI/Events of Note  Notified of K 5.9 - 6.1 Came back from cath lab after transvenous pacer replacement for complete heart block Cardiology assessment patient is fluid overloaded. Positive 1.3 liters since presentation. Nausea and vomiting for the past 3 days.  eICU Interventions  Bedside RN calling nephrology for evaluation Ordered hyperkalemia protocol in the meantime     Intervention Category Major Interventions: Electrolyte abnormality - evaluation and management;Arrhythmia - evaluation and management  Judd Lien 12/20/2020, 12:07 AM

## 2020-12-20 NOTE — Consult Note (Addendum)
Cardiology Consultation:   Patient ID: Colleen [name] MRN: [id number]; DOB: 12-Sep-1966  Admit date: 12/19/2020 Date of Consult: 12/20/2020  PCP:  Orpah Melter, MD   [place]  Cardiologist:  Dr. Virgina Jock   Patient Profile:   Colleen [name] is a 55 y.o. female with a hx of Type I DM who is being seen today for the evaluation of CHB at the request of Dr. Virgina Jock  History of Present Illness:   Colleen [name] sought attention for about 3 days of N/V, had a syncopal event the day of her admission, she was found w/inferior STEMI and CHB Planned for emergent cath and temp wire though her creat came back 4.3 and bedside echo noted presevred LVEF and LHC was deferred and temp pacing wire was placed. She did have RV dysfunction and suspected that her infarct likely started Friday with her symptoms. Trops >75000 Planned to monitor for AV node recovery, started in heparin gtt and cangrelor with hopes that renal recovery allows LHC sooner rather then later. Nephrology on board given gentle hydration  Her temp wire yesterday developed LOC and required repositioning. Nephrology today not yet need for RRT  Today Dr. Virgina Jock noted some pauses without pacing spikes almost no urine OP RV failure with stable BP Suspected perhaps device disconnection given no spikes    LABS HS Trop 75,829 >> >27,000 K+ 5.4 >>>4.8 >> 6.1 >> 4.3 Creat 4.34 >> 5.00 > 5.23 > 4.93 BUN today 90 WBC 13.0 H/H 10.9/33.6 Plts 130  COVID neg   Past Medical History:  Diagnosis Date   Asthma    Complication of anesthesia    Psuedocholinerasterase deficiency per pt   Diabetes mellitus    TYPE II   Diabetic macular edema ([place])    Pt gets injections in eyes with [facility]   Seasonal allergies     Past Surgical History:  Procedure Laterality Date   BACK SURGERY  2009   CATARACT SURGERY     BILATERAL   CESAREAN SECTION     X2   KNEE SURGERY Right    LEFT HEART CATH AND  CORONARY ANGIOGRAPHY N/A 05/08/2017   Procedure: Left Heart Cath and Coronary Angiography;  Surgeon: Nigel Mormon, MD;  Location: [facility];  Service: Cardiovascular;  Laterality: N/A;   ORIF ANKLE FRACTURE Left 03/23/2016   Procedure: OPEN REDUCTION INTERNAL FIXATION (ORIF) ANKLE FRACTURE MALLEOLUS  MALUNION WITH TIBIAL AND FIBULAR OSTEOTOMY AND AUTOGRAFT;  Surgeon: Wylene Simmer, MD;  Location: [facility];  Service: Orthopedics;  Laterality: Left;   SHOULDER ARTHROSCOPY W/ ROTATOR CUFF REPAIR Left    TEMPORARY PACEMAKER N/A 12/19/2020   Procedure: TEMPORARY PACEMAKER;  Surgeon: Nigel Mormon, MD;  Location: [facility];  Service: Cardiovascular;  Laterality: N/A;   TEMPORARY PACEMAKER N/A 12/19/2020   Procedure: TEMPORARY PACEMAKER;  Surgeon: Nigel Mormon, MD;  Location: [facility];  Service: Cardiovascular;  Laterality: N/A;     Home Medications:  Prior to Admission medications   Medication Sig Start Date End Date Taking? Authorizing Provider  albuterol (PROVENTIL HFA;VENTOLIN HFA) 108 (90 Base) MCG/ACT inhaler Inhale 1-2 puffs into the lungs every 6 (six) hours as needed for wheezing or shortness of breath.    [provider]  amLODipine (NORVASC) 5 MG tablet Take 1 tablet (5 mg total) by mouth every evening. 09/29/20 12/28/20  Adrian Prows, MD  Cholecalciferol (VITAMIN D) 2000 units tablet Take 2,000 Units by mouth daily.  [provider]  ferrous sulfate 325 (65 FE) MG tablet Take 325 mg by mouth daily with breakfast.    [provider]  furosemide (LASIX) 40 MG tablet Take 40 mg by mouth as needed for edema.  03/14/17   [provider]  insulin lispro (HUMALOG) 100 UNIT/ML injection Inject into the skin See admin instructions. Insulin Pump - Mini Medtronic 670G    [provider]  lisinopril (ZESTRIL) 5 MG tablet Take 1 tablet by mouth daily. 05/19/19   [provider]  loratadine  (CLARITIN) 10 MG tablet Take 1 tablet by mouth daily as needed.    [provider]  metoprolol succinate (TOPROL-XL) 50 MG 24 hr tablet TAKE 1 TABLET BY MOUTH  DAILY 07/08/20   Adrian Prows, MD  rosuvastatin (CRESTOR) 10 MG tablet Take 10 mg by mouth at bedtime. 06/17/20   [provider]  tobramycin (TOBREX) 0.3 % ophthalmic solution Place 2 drops into both eyes as needed (prior to injections).  12/03/18   [provider]    Inpatient Medications: Scheduled Meds:  alum & mag hydroxide-simeth  30 mL Oral Once   aspirin EC  81 mg Oral Daily   Chlorhexidine Gluconate Cloth  6 each Topical Daily   fentaNYL (SUBLIMAZE) injection  25 mcg Intravenous Once   insulin aspart  1-3 Units Subcutaneous Q4H   insulin detemir  24 Units Subcutaneous Q12H   sodium chloride flush  10-40 mL Intracatheter Q12H   Continuous Infusions:  DOBUTamine 2.5 mcg/kg/min (12/20/20 1316)   heparin 1,400 Units/hr (12/20/20 1100)   insulin 3.8 Units/hr (12/20/20 1301)   PRN Meds: acetaminophen, dextrose, nitroGLYCERIN, ondansetron (ZOFRAN) IV, sodium chloride flush  Allergies:    Allergies  Allergen Reactions   Other Other (See Comments)   Succinylcholine Other (See Comments)    Pseudocholinesterase deficiency    Social History:   Social History   Socioeconomic History   Marital status: Married    Spouse name: Not on file   Number of children: 2   Years of education: Not on file   Highest education level: Not on file  Occupational History   Not on file  Tobacco Use   Smoking status: Former Smoker    Packs/day: 0.25    Years: 2.00    Pack years: 0.50    Types: Cigarettes    Quit date: 01/17/1987    Years since quitting: 33.9   Smokeless tobacco: Never Used  Vaping Use   Vaping Use: Never used  Substance and Sexual Activity   Alcohol use: Yes    Comment: occ   Drug use: No   Sexual activity: Yes    Comment: PATIENT'S PARTNER WITH VASECTOMY  Other Topics Concern   Not on  file  Social History Narrative   Not on file   Social Determinants of Health   Financial Resource Strain: Not on file  Food Insecurity: Not on file  Transportation Needs: Not on file  Physical Activity: Not on file  Stress: Not on file  Social Connections: Not on file  Intimate Partner Violence: Not on file    Family History:   Family History  Problem Relation Age of Onset   Hypertension Father    Hypertension Paternal Aunt    Hypertension Paternal Grandmother    Hypertension Paternal Grandfather      ROS:  Please see the history of present illness.  All other ROS reviewed and negative.     Physical Exam/Data:   Vitals:  12/20/20 1030 12/20/20 1100 12/20/20 1130 12/20/20 1144  BP: 135/73 (!) 150/80 106/82   Pulse: 70 70 70   Resp: 15 15 17    Temp:    98.3 F (36.8 C)  TempSrc:    Oral  SpO2: 96% 96% 96%   Weight:      Height:        Intake/Output Summary (Last 24 hours) at 12/20/2020 1331 Last data filed at 12/20/2020 1144 Gross per 24 hour  Intake 2884.76 ml  Output 305 ml  Net 2579.76 ml   Last 3 Weights 12/20/2020 12/19/2020 11/15/2020  Weight (lbs) 215 lb 13.3 oz 205 lb 208 lb  Weight (kg) 97.9 kg 92.987 kg 94.348 kg     Body mass index is 38.23 kg/m.  General:  Well nourished, well developed, in no acute distress, she is tired, wakes easy, feels weak, AAO x3, appropriate HEENT: normal Lymph: no adenopathy Neck: no JVD Endocrine:  No thryomegaly Vascular: No carotid bruits  Cardiac:   RRR; no murmurs, gallops or rubs Lungs: CTA b/l, no wheezing, rhonchi or rales  Abd: soft, nontender Ext: no edema Musculoskeletal:  No deformities Skin: warm and dry  Neuro:  no focal abnormalities noted Psych:  Normal affect   EKG:  The EKG was personally reviewed and demonstrates:   #1 CHB 34bpm, inf ST elevations, QRS 170ms Today looks like pacing was pacing held CHB V asystole  Telemetry:  Telemetry was personally reviewed and demonstrates:   V paced The  last short pause was 11:12 I suspect this was Dr. Virgina Jock testing (his noted followed)  Relevant CV Studies:  12/19/20; TTE IMPRESSIONS   1. Left ventricular ejection fraction, by estimation, is 55 to 60%. The  left ventricle has normal function. The left ventricle demonstrates  regional wall motion abnormalities (see scoring diagram/findings for  description). Left ventricular diastolic  parameters are indeterminate. There is mild hypokinesis of the left  ventricular, basal inferolateral wall.   2. Right ventricular systolic function is moderately reduced. The right  ventricular size is moderately enlarged. Estimated PASP 33 mmHg.   3. Right atrial size was mildly dilated.   4. Tricuspid valve regurgitation is severe.   5. The inferior vena cava is normal in size with <50% respiratory  variability, suggesting right atrial pressure of 8 mmHg.   6. Comapared to previous study report in 2018, LV/RV wall motion  abnormalities are new.   Laboratory Data:  High Sensitivity Troponin:   Recent Labs  Lab 12/19/20 0715 12/19/20 1154  TROPONINIHS 75,829* >27,000*     Chemistry Recent Labs  Lab 12/20/20 0202 12/20/20 0414 12/20/20 0848  NA 133* 133* 133*  K 4.4 4.3 4.3  CL 102 102 102  CO2 14* 14* 20*  GLUCOSE 293* 242* 208*  BUN 85* 91* 90*  CREATININE 5.13* 5.23* 4.93*  CALCIUM 7.8* 7.9* 8.8*  GFRNONAA 9* 9* 10*  ANIONGAP 17* 17* 11    Recent Labs  Lab 12/19/20 0532 12/19/20 1825 12/20/20 0414 12/20/20 0415  PROT 7.0 6.0*  --  5.6*  ALBUMIN 4.0 3.5 3.4* 3.4*  AST 3,285* 5,434*  --  4,893*  ALT 1,814* 2,839*  --  2,931*  ALKPHOS 139* 124  --  114  BILITOT 2.4* 2.5*  --  2.0*   Hematology Recent Labs  Lab 12/19/20 0532 12/19/20 0724 12/19/20 2302 12/19/20 2314 12/20/20 0202  WBC 11.7*  --   --   --  13.0*  RBC 4.11  --   --   --  3.66*  HGB 12.3   < > 9.5* 11.9* 10.9*  HCT 38.5   < > 28.0* 35.0* 33.6*  MCV 93.7  --   --   --  91.8  MCH 29.9  --   --    --  29.8  MCHC 31.9  --   --   --  32.4  RDW 13.2  --   --   --  12.9  PLT 173  --   --   --  130*   < > = values in this interval not displayed.   BNPNo results for input(s): BNP, PROBNP in the last 168 hours.  DDimer No results for input(s): DDIMER in the last 168 hours.   Radiology/Studies:  DG Chest 1 View Result Date: 12/20/2020 CLINICAL DATA:  Transvenous pacemaker placement EXAM: CHEST  1 VIEW COMPARISON:  09/25/2011 FINDINGS: Inferiorly approaching trans venous pacemaker lead is seen overlying the expected right ventricle. Lung volumes are small, but are symmetric and are clear. Right internal jugular central venous catheter tip overlies the expected innominate vein. No pneumothorax or pleural effusion. Cardiac size is within normal limits. Pulmonary vascularity is normal. No acute bone abnormality. IMPRESSION: Inferiorly approaching trans venous pacemaker lead overlies the expected right ventricle. Electronically Signed   By: Fidela Salisbury MD   On: 12/20/2020 00:03     CT Head Wo Contrast Result Date: 12/19/2020 CLINICAL DATA:  Confusion.  Recent fall EXAM: CT HEAD WITHOUT CONTRAST TECHNIQUE: Contiguous axial images were obtained from the base of the skull through the vertex without intravenous contrast. COMPARISON:  03/22/2017 FINDINGS: Brain: No evidence of infarction, hemorrhage, hydrocephalus, extra-axial collection or mass lesion/mass effect. Vascular: Negative Skull: Negative for fracture Sinuses/Orbits: Bilateral cataract resection. Other: These results were called by telephone at the time of interpretation on 12/19/2020 at 8:31 am to provider MELANIE BELFI , who verbally acknowledged these results. IMPRESSION: Normal appearance of the brain. Electronically Signed   By: Monte Fantasia M.D.   On: 12/19/2020 08:31    Assessment and Plan:   1. CHB (no underlying escape)     In setting of an acute inferior STEMI/MI     RV failure     AKI     Abn LFTs     DKA      Temp RV  pacing wore repositioned last night Dr. Virgina Jock felt CXR noted stable wire position and no further lack of pacing noted since his visit. With good capture  I suspect the 9 sec CHB/V pause was staff withholding pacing for EKG And RN confirms Dr. Virgina Jock checked underlying as well. I do not see lack of pacing otherwise.  Hopefully her renal function will allow LHC/intervention and perhaps recovery of her conduction     Risk Assessment/Risk Scores:  { For questions or updates, please contact [facility] Please consult [email or web address] for contact info under    Signed, Baldwin Jamaica, PA-C  12/20/2020 1:31 PM;t  EP Attending  Patient seen and examined. Agree with the findings as noted above. The patient remains critically ill. She has an inferior MI complicated by shock and CHB. She is s/p insertion of a temp PM wire which is currently working well. She had no escape today. She will undergo supportive care. If her renal function improves, she will undergo left heart cath under the direction of Dr. Virgina Jock. No indication for PPM insertion at this time.   Carleene Overlie Harles [place]etts,MD

## 2020-12-21 DIAGNOSIS — I442 Atrioventricular block, complete: Secondary | ICD-10-CM | POA: Diagnosis not present

## 2020-12-21 DIAGNOSIS — E109 Type 1 diabetes mellitus without complications: Secondary | ICD-10-CM | POA: Diagnosis not present

## 2020-12-21 DIAGNOSIS — I4430 Unspecified atrioventricular block: Secondary | ICD-10-CM | POA: Diagnosis not present

## 2020-12-21 DIAGNOSIS — N179 Acute kidney failure, unspecified: Secondary | ICD-10-CM | POA: Diagnosis not present

## 2020-12-21 LAB — COOXEMETRY PANEL
Carboxyhemoglobin: 0.9 % (ref 0.5–1.5)
Methemoglobin: 0.8 % (ref 0.0–1.5)
O2 Saturation: 65.9 %
Total hemoglobin: 10.5 g/dL — ABNORMAL LOW (ref 12.0–16.0)

## 2020-12-21 LAB — GLUCOSE, CAPILLARY
Glucose-Capillary: 135 mg/dL — ABNORMAL HIGH (ref 70–99)
Glucose-Capillary: 153 mg/dL — ABNORMAL HIGH (ref 70–99)
Glucose-Capillary: 157 mg/dL — ABNORMAL HIGH (ref 70–99)
Glucose-Capillary: 135 mg/dL — ABNORMAL HIGH (ref 70–99)
Glucose-Capillary: 109 mg/dL — ABNORMAL HIGH (ref 70–99)
Glucose-Capillary: 94 mg/dL (ref 70–99)
Glucose-Capillary: 68 mg/dL — ABNORMAL LOW (ref 70–99)
Glucose-Capillary: 71 mg/dL (ref 70–99)
Glucose-Capillary: 111 mg/dL — ABNORMAL HIGH (ref 70–99)

## 2020-12-21 LAB — COMPREHENSIVE METABOLIC PANEL
ALT: 2551 U/L — ABNORMAL HIGH (ref 0–44)
AST: 2521 U/L — ABNORMAL HIGH (ref 15–41)
Albumin: 3.3 g/dL — ABNORMAL LOW (ref 3.5–5.0)
Alkaline Phosphatase: 125 U/L (ref 38–126)
Anion gap: 12 (ref 5–15)
BUN: 90 mg/dL — ABNORMAL HIGH (ref 6–20)
CO2: 20 mmol/L — ABNORMAL LOW (ref 22–32)
Calcium: 8.2 mg/dL — ABNORMAL LOW (ref 8.9–10.3)
Chloride: 101 mmol/L (ref 98–111)
Creatinine, Ser: 3.47 mg/dL — ABNORMAL HIGH (ref 0.44–1.00)
GFR, Estimated: 15 mL/min — ABNORMAL LOW (ref >60)
Glucose, Bld: 156 mg/dL — ABNORMAL HIGH (ref 70–99)
Potassium: 4.4 mmol/L (ref 3.5–5.1)
Sodium: 133 mmol/L — ABNORMAL LOW (ref 135–145)
Total Bilirubin: 1.8 mg/dL — ABNORMAL HIGH (ref 0.3–1.2)
Total Protein: 5.4 g/dL — ABNORMAL LOW (ref 6.5–8.1)

## 2020-12-21 LAB — CBC
HCT: 31.5 % — ABNORMAL LOW (ref 36.0–46.0)
Hemoglobin: 10.5 g/dL — ABNORMAL LOW (ref 12.0–15.0)
MCH: 30.1 pg (ref 26.0–34.0)
MCHC: 33.3 g/dL (ref 30.0–36.0)
MCV: 90.3 fL (ref 80.0–100.0)
Platelets: 107 10*3/uL — ABNORMAL LOW (ref 150–400)
RBC: 3.49 MIL/uL — ABNORMAL LOW (ref 3.87–5.11)
RDW: 13.2 % (ref 11.5–15.5)
WBC: 11.4 10*3/uL — ABNORMAL HIGH (ref 4.0–10.5)
nRBC: 0.5 % — ABNORMAL HIGH (ref 0.0–0.2)

## 2020-12-21 LAB — TSH: TSH: 1.862 u[IU]/mL (ref 0.350–4.500)

## 2020-12-21 LAB — POTASSIUM
Potassium: 4.5 mmol/L (ref 3.5–5.1)
Potassium: 4.2 mmol/L (ref 3.5–5.1)
Potassium: 4.3 mmol/L (ref 3.5–5.1)
Potassium: 3.9 mmol/L (ref 3.5–5.1)

## 2020-12-21 LAB — PROTIME-INR
INR: 1.9 — ABNORMAL HIGH (ref 0.8–1.2)
Prothrombin Time: 21.3 seconds — ABNORMAL HIGH (ref 11.4–15.2)

## 2020-12-21 LAB — HEPARIN LEVEL (UNFRACTIONATED)
Heparin Unfractionated: 0.13 IU/mL — ABNORMAL LOW (ref 0.30–0.70)
Heparin Unfractionated: 0.4 IU/mL (ref 0.30–0.70)

## 2020-12-21 LAB — PHOSPHORUS: Phosphorus: 4.5 mg/dL (ref 2.5–4.6)

## 2020-12-21 LAB — BRAIN NATRIURETIC PEPTIDE: B Natriuretic Peptide: 1323.7 pg/mL — ABNORMAL HIGH (ref 0.0–100.0)

## 2020-12-21 MED ORDER — FUROSEMIDE 10 MG/ML IJ SOLN
100.0000 mg | Freq: Once | INTRAVENOUS | Status: AC
  Administered 2020-12-21: 100 mg via INTRAVENOUS
  Filled 2020-12-21: qty 10

## 2020-12-21 MED ORDER — HEPARIN BOLUS VIA INFUSION
2000.0000 [IU] | Freq: Once | INTRAVENOUS | Status: AC
  Administered 2020-12-21: 2000 [IU] via INTRAVENOUS
  Filled 2020-12-21: qty 2000

## 2020-12-21 MED ORDER — ALUM & MAG HYDROXIDE-SIMETH 200-200-20 MG/5ML PO SUSP
15.0000 mL | Freq: Four times a day (QID) | ORAL | Status: DC | PRN
  Administered 2020-12-21 – 2020-12-24 (×6): 15 mL via ORAL
  Filled 2020-12-21 (×7): qty 30

## 2020-12-21 MED ORDER — ROSUVASTATIN CALCIUM 20 MG PO TABS: 20.0000 mg | ORAL_TABLET | Freq: Every day | ORAL | Status: DC

## 2020-12-21 MED ORDER — INSULIN DETEMIR 100 UNIT/ML ~~LOC~~ SOLN
21.0000 [IU] | Freq: Once | SUBCUTANEOUS | Status: AC
  Administered 2020-12-21: 21 [IU] via SUBCUTANEOUS
  Filled 2020-12-21: qty 0.21

## 2020-12-21 MED ORDER — METOCLOPRAMIDE HCL 5 MG PO TABS
5.0000 mg | ORAL_TABLET | Freq: Three times a day (TID) | ORAL | Status: DC | PRN
  Administered 2020-12-25 – 2020-12-27 (×5): 5 mg via ORAL
  Filled 2020-12-21 (×6): qty 1

## 2020-12-21 MED ORDER — OXYCODONE HCL 5 MG PO TABS
2.5000 mg | ORAL_TABLET | Freq: Four times a day (QID) | ORAL | Status: DC | PRN
  Administered 2020-12-21 – 2020-12-23 (×7): 2.5 mg via ORAL
  Filled 2020-12-21 (×8): qty 1

## 2020-12-21 NOTE — Progress Notes (Signed)
Subjective: Feels much better and more awake today No chest pain  Blood pressure stable on low dose dobutamine Urine put put 600 cc in 24 hrs Urine more clear Nephrology and critical care following.  Temporary pacemaker in place    Objective:  Vital Signs in the last 24 hours: Temp:  [98.1 F (36.7 C)-98.5 F (36.9 C)] 98.3 F (36.8 C) (03/29 0655) Pulse Rate:  [70-72] 71 (03/29 0600) Resp:  [13-21] 13 (03/29 0600) BP: (78-152)/(58-94) 101/69 (03/29 0200) SpO2:  [93 %-98 %] 97 % (03/29 0600) Arterial Line BP: (133-177)/(52-81) 150/64 (03/29 0600)  Intake/Output from previous day: 03/28 0701 - 03/29 0700 In: 1413.5 [I.V.:1413.5] Out: 605 [Urine:605]  Physical Exam Vitals and nursing note reviewed.  Constitutional:      General: She is not in acute distress.    Appearance: She is well-developed. She is not ill-appearing.  HENT:     Head: Normocephalic and atraumatic.  Eyes:     Conjunctiva/sclera: Conjunctivae normal.     Pupils: Pupils are equal, round, and reactive to light.  Neck:     Vascular: No JVD.  Cardiovascular:     Rate and Rhythm: Regular rhythm. Bradycardia present.     Pulses: Normal pulses and intact distal pulses.     Heart sounds: No murmur heard.     Comments: Temporary pacemaker in place through Rt CFV Pulmonary:     Effort: Pulmonary effort is normal.     Breath sounds: Normal breath sounds. No wheezing or rales.  Abdominal:     General: Bowel sounds are normal.     Palpations: Abdomen is soft.     Tenderness: There is no rebound.  Musculoskeletal:        General: No tenderness. Normal range of motion.     Right lower leg: No edema.     Left lower leg: No edema.  Lymphadenopathy:     Cervical: No cervical adenopathy.  Skin:    General: Skin is warm and dry.  Neurological:     Mental Status: She is alert and oriented to person, place, and time.     Cranial Nerves: No cranial nerve deficit.     Comments: Alert, awake, oriented x3.   However, has periods of somnolence.      Lab Results: BMP Recent Labs    12/20/20 0848 12/20/20 1511 12/20/20 2005 12/20/20 2359 12/21/20 0328  NA 133* 135  --   --  133*  K 4.3 4.1 4.3 4.5 4.4  CL 102 104  --   --  101  CO2 20* 21*  --   --  20*  GLUCOSE 208* 85  --   --  156*  BUN 90* 91*  --   --  90*  CREATININE 4.93* 4.44*  --   --  3.47*  CALCIUM 8.8* 8.6*  --   --  8.2*  GFRNONAA 10* 11*  --   --  15*    CBC Recent Labs  Lab 12/21/20 0328  WBC 11.4*  RBC 3.49*  HGB 10.5*  HCT 31.5*  PLT 107*  MCV 90.3  MCH 30.1  MCHC 33.3  RDW 13.2    HEMOGLOBIN A1C Lab Results  Component Value Date   HGBA1C 7.8 (H) 12/19/2020   MPG 177.16 12/19/2020    Cardiac Panel (last 3 results) Results for WOODIE, TRUSTY (MRN [id number]) as of 12/20/2020 11:29  Ref. Range 12/19/2020 07:15 12/19/2020 11:54  Troponin I (High Sensitivity) Latest Ref Range: <18 ng/L 75,829 (  HH) >27,000 (HH)   BNP (last 3 results) N/A  TSH N/A  Lipid Panel     Component Value Date/Time   CHOL 120 12/20/2020 0415   CHOL 164 10/08/2019 0901   TRIG 96 12/20/2020 0415   HDL 59 12/20/2020 0415   HDL 99 10/08/2019 0901   CHOLHDL 2.0 12/20/2020 0415   VLDL 19 12/20/2020 0415   LDLCALC 42 12/20/2020 0415   LDLCALC 54 10/08/2019 0901   LDLDIRECT 61 10/08/2019 0901     Hepatic Function Panel Recent Labs    12/19/20 1825 12/20/20 0414 12/20/20 0415 12/21/20 0328  PROT 6.0*  --  5.6* 5.4*  ALBUMIN 3.5 3.4* 3.4* 3.3*  AST 5,434*  --  4,893* 2,521*  ALT 2,839*  --  2,931* 2,551*  ALKPHOS 124  --  114 125  BILITOT 2.5*  --  2.0* 1.8*  BILIDIR  --   --  0.5*  --   IBILI  --   --  1.5*  --     Imaging: Chest x-ray 12/12/2020: Trachea is midline. Heart is enlarged, stable. Thoracic aorta is calcified. Right IJ central line tip is in the brachiocephalic vein junction region. Transvenous pacemaker is seen from an IVC approach with tip projecting over the right ventricle. Defect related  pads are in place.  Lungs are somewhat low in volume with minimal bibasilar subsegmental atelectasis. No pleural fluid.  IMPRESSION: Low lung volumes with bibasilar subsegmental atelectasis.  CT head 12/19/2020: Normal appearance of the brain.  Cardiac Studies:  EKG 12/21/2020: Sinus rhythm with 2:1 AV block Ventricular rate 42 bpm Inferior infarct, likely recent  EKG 12/20/2020: Sinus rhythm with complete AV block.   Asystole without ventricular escape rhythm. Two ventricular pacing complexes seen.  Echocardiogram 12/19/2020: 1. Left ventricular ejection fraction, by estimation, is 55 to 60%. The  left ventricle has normal function. The left ventricle demonstrates  regional wall motion abnormalities (see scoring diagram/findings for  description). Left ventricular diastolic  parameters are indeterminate. There is mild hypokinesis of the left  ventricular, basal inferolateral wall.  2. Right ventricular systolic function is moderately reduced. The right  ventricular size is moderately enlarged. Estimated PASP 33 mmHg.  3. Right atrial size was mildly dilated.  4. Tricuspid valve regurgitation is severe.  5. The inferior vena cava is normal in size with <50% respiratory  variability, suggesting right atrial pressure of 8 mmHg.  6. Comapared to previous study report in 2018, LV/RV wall motion  abnormalities are new.   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  Inferior STEMI: Primarily RV infarct with moderate RV dilatation and dysfunction.  High-sensitivity troponin > 27,000 Currently chest pain-free.  Hemodynamically stable. I stopped dobutamine this morning. MAP >65 mmHg. Monitor to see if dobutamine can be discontinued. Continue aspirin, heparin. May add Brilinta on 12/22/2020 if it becomes clear that  she will not need PPM Holding statin due to liver enzyme elevation.  Holding beta-blocker, ACE due to complete heart block and AKI. We will consider stress test or coronary angiography several days from now for risk stratification, after complete renal recovery.   RV failure: Hemodynamically stable. Off dobutamine this morning. Liver enzymes coming down Conservative management.  Do not think she needs inotropes at this time. Given her RV dilatation, I would not add any volume at this time.  Complete AV block: Ischemic in  etiology. Sinus rhythm with 2:1 conduction this morning I have left the pacemaker in place with rate of 60 bpm for now.  I am hopeful that her conduction will continue to improve and she will not need PPM.   AKI: 600 cc in 24 hrs. Cr down to 3.4. Urine is more clearer today. I am hopeful that she is now in post ATN phase. Appreciate nephrology input.  Type 1 DM:  Insulin per CCM. Electrolytes stable today.  CRITICAL CARE Performed by: Vernell Leep   Total critical care time: 45 minutes   Critical care time was exclusive of separately billable procedures and treating other patients.   Critical care was necessary to treat or prevent imminent or life-threatening deterioration.   Critical care was time spent personally by me on the following activities: development of treatment plan with patient and/or surrogate as well as nursing, discussions with consultants, evaluation of patient's response to treatment, examination of patient, obtaining history from patient or surrogate, ordering and performing treatments and interventions, ordering and review of laboratory studies, ordering and review of radiographic studies, pulse oximetry and re-evaluation of patient's condition.      Ma.nish Esther Hardy, MD Pager: [phone] Office: [phone]

## 2020-12-21 NOTE — Progress Notes (Signed)
[facility] Progress Note   Assessment/ Plan: 1. AKI on CKD 3 - baseline creat 1.2- 1.4 from dec 2021, CKD stage 3. . Renal US w/o obstruction. UA unremarkable.  AKI in setting of cardiogenic shock.  She's improving with supportive care and there are no indications for RRT at this time.  Appears fairly euvolemic still but is net + 2.6L.  Can use diuretics PRN to maintain euvolemia.  Avoid contrast as able in setting of AKI.   2. Acute MI - trop 75K, per cardiology suspecting RV infarct. Holding off on heart cath for now given AKI. 3. Shock - cardiogenic from #4, now paced; also rec'd volume support. Currently not requiring any vasopressors.  4. Complete AV block - Seems to be improving; EP is following and may not need to place PPM.  5. DM type 1: per primary 6. Hepatitis:  likely secondary to shock. Trending down   Jannifer Hick MD 12/20/2020, 8:17 AM  [facility] Pager: [phone] _________________________________________________________  Background on initial C/S: HPI: The patient is a 55 y.o. year-old w/ hx of T1DM on insulin pump, hx mild CAD presented w/ 3d hx N/V then had syncopal episode. In ED EKG showed inf ST elevation. Cardiology consulted for possible AV block.  Creat 4.0.  Pt went to cath lab for temp pacemaker today. Asked to see for renal failure.   Pt seen in ICU.  Pt is f/u Dr Hollie Salk, per husband she has stage 3 CKD, baseline creat around 1.3.  Gives hx of N/V for 2-3 days. Denies CP. Has neuropathic LE's, per husband most of the L lower leg is "made of metal". No active SOB or cough, no abd pain. No fevers.   Subjective:   Doing a bit better today - conduction improving and cardiology to watch a few more days prior to deciding on PPM.  Holding on LHC as well in light of AKI which is improving.  UOP 623mL yesterday.  She has some dyspepsia this AM but is generally feeling improved.   Objective Vitals:   12/21/20 0655 12/21/20 0700  12/21/20 0800 12/21/20 0900  BP:      Pulse:  70 70 69  Resp:   16 15  Temp: 98.3 F (36.8 C)     TempSrc: Oral  Oral   SpO2:  95% 97% 96%  Weight:      Height:       Physical Exam General: lying flat in bed on RA Heart: paced in 70s, no rub Lungs: clear, normal WOB Abdomen:soft NABS Extremities: no edema  Dialysis Access: none GU:  Foley with clear yellow urine  Additional Objective Labs: Basic Metabolic Panel: Recent Labs  Lab 12/20/20 0414 12/20/20 0848 12/20/20 1511 12/20/20 2005 12/20/20 2359 12/21/20 0328  NA 133* 133* 135  --   --  133*  K 4.3 4.3 4.1 4.3 4.5 4.4  CL 102 102 104  --   --  101  CO2 14* 20* 21*  --   --  20*  GLUCOSE 242* 208* 85  --   --  156*  BUN 91* 90* 91*  --   --  90*  CREATININE 5.23* 4.93* 4.44*  --   --  3.47*  CALCIUM 7.9* 8.8* 8.6*  --   --  8.2*  PHOS 7.1*  --   --   --   --  4.5   Liver Function Tests: Recent Labs  Lab 12/19/20 1825 12/20/20 0414 12/20/20 0415 12/21/20  0328  AST 5,434*  --  4,893* 2,521*  ALT 2,839*  --  2,931* 2,551*  ALKPHOS 124  --  114 125  BILITOT 2.5*  --  2.0* 1.8*  PROT 6.0*  --  5.6* 5.4*  ALBUMIN 3.5 3.4* 3.4* 3.3*   Recent Labs  Lab 12/19/20 0532  LIPASE 20   CBC: Recent Labs  Lab 12/19/20 0532 12/19/20 0724 12/19/20 2314 12/20/20 0202 12/21/20 0328  WBC 11.7*  --   --  13.0* 11.4*  HGB 12.3   < > 11.9* 10.9* 10.5*  HCT 38.5   < > 35.0* 33.6* 31.5*  MCV 93.7  --   --  91.8 90.3  PLT 173  --   --  130* 107*   < > = values in this interval not displayed.   Blood Culture No results found for: SDES, SPECREQUEST, CULT, REPTSTATUS  Cardiac Enzymes: No results for input(s): CKTOTAL, CKMB, CKMBINDEX, TROPONINI in the last 168 hours. CBG: Recent Labs  Lab 12/20/20 2022 12/20/20 2219 12/20/20 2359 12/21/20 0328 12/21/20 0649  GLUCAP 127* 135* 157* 153* 135*   Iron Studies: No results for input(s): IRON, TIBC, TRANSFERRIN, FERRITIN in the last 72  hours. @lablastinr3 @ Studies/Results: DG Chest 1 View  Result Date: 12/20/2020 CLINICAL DATA:  Transvenous pacemaker placement EXAM: CHEST  1 VIEW COMPARISON:  09/25/2011 FINDINGS: Inferiorly approaching trans venous pacemaker lead is seen overlying the expected right ventricle. Lung volumes are small, but are symmetric and are clear. Right internal jugular central venous catheter tip overlies the expected innominate vein. No pneumothorax or pleural effusion. Cardiac size is within normal limits. Pulmonary vascularity is normal. No acute bone abnormality. IMPRESSION: Inferiorly approaching trans venous pacemaker lead overlies the expected right ventricle. Electronically Signed   By: Fidela Salisbury MD   On: 12/20/2020 00:03   DG Chest 1 View  Result Date: 12/19/2020 CLINICAL DATA:  Encounter for central line placement. Verification of temporary pacing lead location. EXAM: CHEST  1 VIEW COMPARISON:  Radiograph earlier today. FINDINGS: Single fluoroscopic spot view of the lower chest is obtained. The right internal jugular central catheter on prior radiograph is not seen on this view. There multiple overlying monitoring devices. Presumed pacing lead from inferior projects over the expected location of the central/right heart. IMPRESSION: Single fluoroscopic spot view of the lower chest. Pacing lead is tentatively visualized projecting over the central/right heart. Electronically Signed   By: Keith Rake M.D.   On: 12/19/2020 22:07   CARDIAC CATHETERIZATION  Addendum Date: 12/19/2020   Successful temporary pacemaker placement. Nigel Mormon, MD Pager: [phone] Office: [phone]   Result Date: 12/19/2020 Successful temporary pacemaker placement. Nigel Mormon, MD Pager: [phone] Office: [phone]   US RENAL  Result Date: 12/19/2020 CLINICAL DATA:  55 year old female with acute kidney injury. EXAM: RENAL / URINARY TRACT ULTRASOUND COMPLETE COMPARISON:  10/15/2016 MR  FINDINGS: Right Kidney: Renal measurements: 8.9 x 4.3 x 5.5 cm = volume: 109 mL. Echogenicity within normal limits. No solid mass or hydronephrosis visualized. A 2 cm cyst is noted. Left Kidney: Renal measurements: 9.8 x 4.7 x 4.7 cm = volume: 112 mL. Echogenicity within normal limits. No solid mass or hydronephrosis visualized. Two cysts are noted. Bladder: Appears normal for degree of bladder distention. Other: None. IMPRESSION: Unremarkable renal ultrasound except for scattered renal cysts. No hydronephrosis. Electronically Signed   By: Margarette Canada M.D.   On: 12/19/2020 14:41   DG CHEST PORT 1 VIEW  Result Date: 12/20/2020 CLINICAL DATA:  Temporary pacemaker. EXAM: PORTABLE CHEST 1 VIEW COMPARISON:  12/19/2020. FINDINGS: Trachea is midline. Heart is enlarged, stable. Thoracic aorta is calcified. Right IJ central line tip is in the brachiocephalic vein junction region. Transvenous pacemaker is seen from an IVC approach with tip projecting over the right ventricle. Defect related pads are in place. Lungs are somewhat low in volume with minimal bibasilar subsegmental atelectasis. No pleural fluid. IMPRESSION: Low lung volumes with bibasilar subsegmental atelectasis. Electronically Signed   By: Lorin Picket M.D.   On: 12/20/2020 10:00   DG Chest Port 1 View  Result Date: 12/19/2020 CLINICAL DATA:  Temporary cardiac pacemaker placement EXAM: PORTABLE CHEST 1 VIEW COMPARISON:  08/26/2015 FINDINGS: UPPER limits normal heart size again noted. A lead extending from the abdomen is noted with tip overlying the mid-LEFT heart. A RIGHT IJ central venous catheter sheath is present with tip overlying the mid SVC. There is no evidence of focal airspace disease, pulmonary edema, suspicious pulmonary nodule/mass, pleural effusion, or pneumothorax. No acute bony abnormalities are identified. IMPRESSION: Lead extending from the abdomen overlying the mid-LEFT heart. No pneumothorax. RIGHT IJ central venous catheter sheath.  Electronically Signed   By: Margarette Canada M.D.   On: 12/19/2020 11:33   DG C-Arm 1-60 Min  Result Date: 12/19/2020 CLINICAL DATA:  Verification of temporary pacing lead location. EXAM: DG C-ARM 1-60 MIN FLUOROSCOPY TIME:  Fluoroscopy Time:  179 seconds Radiation Exposure Index (if provided by the fluoroscopic device): 41.45 mGy Number of Acquired Spot Images: 1 COMPARISON:  Radiograph earlier this day. FINDINGS: Single fluoroscopic spot view of the lower chest is obtained. The right internal jugular central catheter on prior radiograph is not seen on this view. There multiple overlying monitoring devices. Presumed pacing lead from inferior projects over the expected location of the central/right heart. IMPRESSION: Single fluoroscopic spot view of the lower chest. Pacing lead is tentatively visualized projecting over the central/right heart. Electronically Signed   By: Keith Rake M.D.   On: 12/19/2020 22:15   ECHOCARDIOGRAM COMPLETE  Result Date: 12/19/2020    ECHOCARDIOGRAM LIMITED REPORT   Patient Name:   CHELSIE BUREL Dowis Date of Exam: 12/19/2020 Medical Rec #:  [id number]        Height:       63.0 in Accession #:    [id number]       Weight:       205.0 lb Date of Birth:  [birth year]        BSA:          1.954 m Patient Age:    55 years         BP:           116/76 mmHg Patient Gender: F                HR:           60 bpm. Exam Location:  Inpatient Procedure: 2D Echo and Intracardiac Opacification Agent Indications:     abnormal ecg  History:         Patient has no prior history of Echocardiogram examinations.                  Arrythmias:complete AV block; Risk Factors:Diabetes and                  Hypertension.  Sonographer:     Johny Chess RDCS Referring Phys:  [id number] [facility] J PATWARDHAN Diagnosing Phys: Vernell Leep MD IMPRESSIONS  1. Left ventricular ejection fraction,  by estimation, is 55 to 60%. The left ventricle has normal function. The left ventricle demonstrates regional wall motion  abnormalities (see scoring diagram/findings for description). Left ventricular diastolic parameters are indeterminate. There is mild hypokinesis of the left ventricular, basal inferolateral wall.  2. Right ventricular systolic function is moderately reduced. The right ventricular size is moderately enlarged. Estimated PASP 33 mmHg.  3. Right atrial size was mildly dilated.  4. Tricuspid valve regurgitation is severe.  5. The inferior vena cava is normal in size with <50% respiratory variability, suggesting right atrial pressure of 8 mmHg.  6. Comapared to previous study report in 2018, LV/RV wall motion abnormalities are new. FINDINGS  Left Ventricle: Left ventricular ejection fraction, by estimation, is 55 to 60%. The left ventricle has normal function. The left ventricle demonstrates regional wall motion abnormalities. Mild hypokinesis of the left ventricular, basal inferolateral wall. Definity contrast agent was given IV to delineate the left ventricular endocardial borders. Abnormal (paradoxical) septal motion, consistent with RV pacemaker. Left ventricular diastolic parameters are indeterminate. Right Ventricle: The right ventricular size is moderately enlarged. No increase in right ventricular wall thickness. Right ventricular systolic function is moderately reduced. There is moderately elevated pulmonary artery systolic pressure. The tricuspid  regurgitant velocity is 2.39 m/s, and with an assumed right atrial pressure of 8 mmHg, the estimated right ventricular systolic pressure is 64.3 mmHg. Left Atrium: Left atrial size was normal in size. Right Atrium: Right atrial size was mildly dilated. Pericardium: There is no evidence of pericardial effusion. Mitral Valve: The mitral valve is grossly normal. Tricuspid Valve: The tricuspid valve is grossly normal. Tricuspid valve regurgitation is severe. Aortic Valve: The aortic valve is grossly normal. Aortic valve regurgitation is not visualized. Pulmonic Valve: The  pulmonic valve was grossly normal. Pulmonic valve regurgitation is not visualized. Aorta: The aortic root and ascending aorta are structurally normal, with no evidence of dilitation. Venous: The inferior vena cava is normal in size with less than 50% respiratory variability, suggesting right atrial pressure of 8 mmHg. Additional Comments: A device lead is visualized in the inferior vena cava. LEFT VENTRICLE PLAX 2D LVIDd:         4.40 cm LVIDs:         3.50 cm LV PW:         1.00 cm LV IVS:        1.00 cm LVOT diam:     1.60 cm LVOT Area:     2.01 cm  RIGHT VENTRICLE         IVC TAPSE (M-mode): 1.2 cm  IVC diam: 2.10 cm LEFT ATRIUM             Index       RIGHT ATRIUM           Index LA diam:        2.70 cm 1.38 cm/m  RA Area:     16.40 cm LA Vol (A2C):   38.6 ml 19.75 ml/m RA Volume:   47.30 ml  24.21 ml/m LA Vol (A4C):   37.5 ml 19.19 ml/m LA Biplane Vol: 40.2 ml 20.57 ml/m   AORTA Ao Root diam: 3.10 cm Ao Asc diam:  3.30 cm TRICUSPID VALVE TR Peak grad:   22.8 mmHg TR Vmax:        239.00 cm/s  SHUNTS Systemic Diam: 1.60 cm Vernell Leep MD Electronically signed by Vernell Leep MD Signature Date/Time: 12/19/2020/4:19:20 PM    Final    Medications: . DOBUTamine Stopped (  12/21/20 0949)  . furosemide    . heparin 1,650 Units/hr (12/21/20 0600)   . alum & mag hydroxide-simeth  30 mL Oral Once  . aspirin EC  81 mg Oral Daily  . Chlorhexidine Gluconate Cloth  6 each Topical Daily  . fentaNYL (SUBLIMAZE) injection  25 mcg Intravenous Once  . insulin aspart  1-3 Units Subcutaneous Q4H  . insulin detemir  24 Units Subcutaneous Q12H  . sodium chloride flush  10-40 mL Intracatheter Q12H

## 2020-12-21 NOTE — Progress Notes (Signed)
[place] for heparin Indication: chest pain/ACS  Allergies  Allergen Reactions  . Other Other (See Comments)  . Succinylcholine Other (See Comments)    Pseudocholinesterase deficiency    Patient Measurements: Height: 5\' 3"  (160 cm) Weight: 97.9 kg (215 lb 13.3 oz) IBW/kg (Calculated) : 52.4 Heparin Dosing Weight: 73.7 kg  Vital Signs: Temp: 98.1 F (36.7 C) (03/29 0000) Temp Source: Oral (03/29 0000) BP: 144/77 (03/29 0000) Pulse Rate: 70 (03/29 0000)  Labs: Recent Labs    12/19/20 0532 12/19/20 0715 12/19/20 0724 12/19/20 0739 12/19/20 1154 12/19/20 1825 12/19/20 2302 12/19/20 2314 12/20/20 0202 12/20/20 0414 12/20/20 0848 12/20/20 0900 12/20/20 1511 12/20/20 1853 12/20/20 2359  HGB 12.3  --    < >  --   --   --  9.5* 11.9* 10.9*  --   --   --   --   --   --   HCT 38.5  --    < >  --   --   --  28.0* 35.0* 33.6*  --   --   --   --   --   --   PLT 173  --   --   --   --   --   --   --  130*  --   --   --   --   --   --   APTT  --   --   --  31  --   --   --   --   --   --   --   --   --   --   --   LABPROT  --   --   --  24.7*  --   --   --   --   --   --   --   --   --   --   --   INR  --   --   --  2.3*  --   --   --   --   --   --   --   --   --   --   --   HEPARINUNFRC  --   --   --   --   --    < >  --   --   --   --   --  0.13*  --  0.21* 0.13*  CREATININE 4.34*  --    < >  --   --    < >  --  5.00* 5.13* 5.23* 4.93*  --  4.44*  --   --   TROPONINIHS  --  09,811*  --   --  >27,000*  --   --   --   --   --   --   --   --   --   --    < > = values in this interval not displayed.    Estimated Creatinine Clearance: 15.8 mL/min (A) (by C-G formula based on SCr of 4.44 mg/dL (H)).  Assessment: 55 y.o. female with heart block and possible ACS for heparin  Goal of Therapy:  Heparin level 0.3-0.7 units/ml Monitor platelets by anticoagulation protocol: Yes   Plan:  Heparin 2000 units IV bolus, then increase heparin  1650  units/hr Check heparin level in 8 hours.  Phillis Knack, PharmD, BCPS    12/21/2020, 12:58 AM

## 2020-12-21 NOTE — Progress Notes (Addendum)
Progress Note  Patient Name: Colleen [name] Date of Encounter: 12/21/2020  [facility] Cardiologist: Dr. Einar Gip  Subjective   "Im doing OK"  Inpatient Medications    Scheduled Meds: . alum & mag hydroxide-simeth  30 mL Oral Once  . aspirin EC  81 mg Oral Daily  . Chlorhexidine Gluconate Cloth  6 each Topical Daily  . fentaNYL (SUBLIMAZE) injection  25 mcg Intravenous Once  . insulin aspart  1-3 Units Subcutaneous Q4H  . insulin detemir  24 Units Subcutaneous Q12H  . sodium chloride flush  10-40 mL Intracatheter Q12H   Continuous Infusions: . DOBUTamine Stopped (12/21/20 0949)  . furosemide    . heparin 1,650 Units/hr (12/21/20 0600)   PRN Meds: acetaminophen, alum & mag hydroxide-simeth, dextrose, metoCLOPramide, nitroGLYCERIN, ondansetron (ZOFRAN) IV, sodium chloride flush   Vital Signs    Vitals:   12/21/20 0655 12/21/20 0700 12/21/20 0800 12/21/20 0900  BP:      Pulse:  70 70 69  Resp:   16 15  Temp: 98.3 F (36.8 C)     TempSrc: Oral  Oral   SpO2:  95% 97% 96%  Weight:      Height:        Intake/Output Summary (Last 24 hours) at 12/21/2020 1006 Last data filed at 12/21/2020 0600 Gross per 24 hour  Intake 785.21 ml  Output 515 ml  Net 270.21 ml   Last 3 Weights 12/20/2020 12/19/2020 11/15/2020  Weight (lbs) 215 lb 13.3 oz 205 lb 208 lb  Weight (kg) 97.9 kg 92.987 kg 94.348 kg      Telemetry    V pacing (2:1 underlying with V rates about 50 - Personally Reviewed  ECG    No new EKGs - Personally Reviewed  Physical Exam   Exam is unchanged GEN: No acute distress.   Neck: No JVD Cardiac: RRR, no murmurs, rubs, or gallops.  Respiratory: Clear to auscultation bilaterally. GI: Soft, nontender, non-distended  MS: No edema; No deformity. Neuro:  Nonfocal  Psych: Normal affect   Labs    High Sensitivity Troponin:   Recent Labs  Lab 12/19/20 0715 12/19/20 1154  TROPONINIHS 75,829* >27,000*      Chemistry Recent Labs  Lab  12/19/20 1825 12/19/20 2234 12/20/20 0414 12/20/20 0415 12/20/20 0848 12/20/20 1511 12/20/20 2005 12/20/20 2359 12/21/20 0328  NA 133*   < > 133*  --  133* 135  --   --  133*  K 5.3*   < > 4.3  --  4.3 4.1 4.3 4.5 4.4  CL 101   < > 102  --  102 104  --   --  101  CO2 15*   < > 14*  --  20* 21*  --   --  20*  GLUCOSE 318*   < > 242*  --  208* 85  --   --  156*  BUN 78*   < > 91*  --  90* 91*  --   --  90*  CREATININE 4.94*   < > 5.23*  --  4.93* 4.44*  --   --  3.47*  CALCIUM 8.3*   < > 7.9*  --  8.8* 8.6*  --   --  8.2*  PROT 6.0*  --   --  5.6*  --   --   --   --  5.4*  ALBUMIN 3.5  --  3.4* 3.4*  --   --   --   --  3.3*  AST 5,434*  --   --  0,109*  --   --   --   --  2,521*  ALT 2,839*  --   --  2,931*  --   --   --   --  2,551*  ALKPHOS 124  --   --  114  --   --   --   --  125  BILITOT 2.5*  --   --  2.0*  --   --   --   --  1.8*  GFRNONAA 10*   < > 9*  --  10* 11*  --   --  15*  ANIONGAP 17*   < > 17*  --  11 10  --   --  12   < > = values in this interval not displayed.     Hematology Recent Labs  Lab 12/19/20 0532 12/19/20 0724 12/19/20 2314 12/20/20 0202 12/21/20 0328  WBC 11.7*  --   --  13.0* 11.4*  RBC 4.11  --   --  3.66* 3.49*  HGB 12.3   < > 11.9* 10.9* 10.5*  HCT 38.5   < > 35.0* 33.6* 31.5*  MCV 93.7  --   --  91.8 90.3  MCH 29.9  --   --  29.8 30.1  MCHC 31.9  --   --  32.4 33.3  RDW 13.2  --   --  12.9 13.2  PLT 173  --   --  130* 107*   < > = values in this interval not displayed.    BNPNo results for input(s): BNP, PROBNP in the last 168 hours.   DDimer No results for input(s): DDIMER in the last 168 hours.   Radiology    DG Chest 1 View  Result Date: 12/20/2020 CLINICAL DATA:  Transvenous pacemaker placement EXAM: CHEST  1 VIEW COMPARISON:  09/25/2011 FINDINGS: Inferiorly approaching trans venous pacemaker lead is seen overlying the expected right ventricle. Lung volumes are small, but are symmetric and are clear. Right internal jugular  central venous catheter tip overlies the expected innominate vein. No pneumothorax or pleural effusion. Cardiac size is within normal limits. Pulmonary vascularity is normal. No acute bone abnormality. IMPRESSION: Inferiorly approaching trans venous pacemaker lead overlies the expected right ventricle. Electronically Signed   By: Fidela Salisbury MD   On: 12/20/2020 00:03     Cardiac Studies   12/19/20; TTE IMPRESSIONS  1. Left ventricular ejection fraction, by estimation, is 55 to 60%. The  left ventricle has normal function. The left ventricle demonstrates  regional wall motion abnormalities (see scoring diagram/findings for  description). Left ventricular diastolic  parameters are indeterminate. There is mild hypokinesis of the left  ventricular, basal inferolateral wall.  2. Right ventricular systolic function is moderately reduced. The right  ventricular size is moderately enlarged. Estimated PASP 33 mmHg.  3. Right atrial size was mildly dilated.  4. Tricuspid valve regurgitation is severe.  5. The inferior vena cava is normal in size with <50% respiratory  variability, suggesting right atrial pressure of 8 mmHg.  6. Comapared to previous study report in 2018, LV/RV wall motion  abnormalities are new.   Patient Profile     55 y.o. female with a hx of Type I DM   Admitted about 3 days of N/V, had a syncopal event the day of her admission, she was found w/inferior STEMI and CHB Planned for emergent cath and temp wire though her creat came back  4.3 and bedside echo noted presevred LVEF and LHC was deferred and temp pacing wire was placed. She did have RV dysfunction and suspected that her infarct likely started Friday with her symptoms. Trops >75000  Assessment & Plan    1. CHB (no underlying escape)     In setting of an acute inferior STEMI/MI     RV failure     AKI - creat down some     Abn LFTs -trending down     DKA - management with CCM      This AM she has 2:1  block  Temp wire is undersensing, Dr. Lovena Le tried to reduce sensitivity though unable to fix  He discussed with the patient, hopeflly her conduction will continue to improve Pacing wire appears stab;e  Would follow for the next few days at least  For questions or updates, please contact [place] Please consult [email or web address] for contact info under   Signed, Baldwin Jamaica, PA-C  12/21/2020, 10:06 AM    EP Attending  Patient seen and examined. Agree with the findings as note above. She has improved. Her kidney function and urine output are both getting better and her AV conduction is now 2:1. There is a good chance the her heart block will resolve. Keep the temp wire in for now. Once/if her AV node conduction returns to normal, remove temp wire. Left heart cath once renal function back to baseline.  Carleene Overlie Steen Bisig,MD

## 2020-12-21 NOTE — Progress Notes (Signed)
NAME:  Colleen [name], MRN:  [id number], DOB:  1966-01-02, LOS: 2 ADMISSION DATE:  12/19/2020, CONSULTATION DATE:  12/21/20  REFERRING MD:  Vernell Leep, CHIEF COMPLAINT: Generalized weakness and loss of consciousness  History of Present Illness:  55 y.o. female with a relevant PMHx of type-1 insulin-dependent DM, HTN, CAD, CKDIII, left ankle arthrodesis 2/2 Charcot joint (2021), transferred from [facility] after p/w n/v and syncopal episode. EKG c/v AV block and ST depression, she was transferred under cardiology service for possible acute inferior wall STEMI and complete heart block requiring temporary pacemaker.  Patient is getting medical management for NSTEMI due to acute kidney injury, PCCM was consulted for management of acute encephalopathy and insulin-dependent diabetes  3/29: Continued complaints of n/v and dyspepsia. Cr and LFTs improving. Producing urine.    Pertinent  Medical History   Past Medical History:  Diagnosis Date  . Asthma   . Complication of anesthesia    Psuedocholinerasterase deficiency per pt  . Diabetes mellitus    TYPE II  . Diabetic macular edema (HCC)    Pt gets injections in eyes with Eyelea  . Seasonal allergies     Coronary angiogram 05/08/2017: Ost 1st Diag to 1st Diag lesion, 80%stenosed. Prox LAD lesion, 40% stenosed. Ost 2nd Diag lesion, 60% stenosed.  Nuclear stress test  [03/26/2017]: 1. Patient attempted treadmill stress, terminated after 2 minutes due to dyspnea. Converted to pharmacologic stress. The resting electrocardiogram demonstrated normal sinus rhythm, normal resting conduction, no resting arrhythmias and normal rest repolarization. Stress EKG is non-diagnostic for ischemia as it a pharmacologic stress using Lexiscan. Stress symptoms included dyspnea. 2. The LV is dilated both at rest and stress images. The LV end diastolic volume was 294TM. There is a moderate area of ischemia in the basal anterior, basal  anteroseptal, mid anterior, mid anteroseptal and apical anterior myocardial wall(s). Overall left ventricular systolic function was abnormal without regional wall motion abnormalities. The left ventricular ejection fraction was calculated or visually estimated to be 41%. This is a high risk study, consider further cardiac work-up  Previous EKGs -  EKG 09/29/2020: Marked sinus bradycardia at rate of 54 bpm with first-degree AV block, poor R wave progression, probably normal variant, cannot exclude anteroseptal infarct old.  No evidence of ischemia, normal QT interval.  No significant change compared to 10/16/2018. No significant change from  EKG 06/18/2019    Significant Hospital Events: Including procedures, antibiotic start and stop dates in addition to other pertinent events    3/27 admitted to ICU  3/27 temporary pacemaker in place   Interim History / Subjective:  Patient conversant and awake this morning as compared to more somnolent state yesterday afternoon. Patient has continued complaint of nausea and dyspepsia w/out emesis. Decreased appetite. Describes stomach pain as "burning". Describes passing flatus. Denies fevers, chills, headache, chest pain, pleuritic pain.   Objective   Blood pressure 101/69, pulse 71, temperature 98.3 F (36.8 C), temperature source Oral, resp. rate 13, height 5\' 3"  (1.6 m), weight 97.9 kg, SpO2 97 %.        Intake/Output Summary (Last 24 hours) at 12/21/2020 0830 Last data filed at 12/21/2020 0600 Gross per 24 hour  Intake 1084.41 ml  Output 565 ml  Net 519.41 ml   Filed Weights   12/19/20 0745 12/20/20 0455  Weight: 93 kg 97.9 kg    Examination: General: NAD, conversant, engaged HENT: EOMI, sclera anicteric  Lungs: CTAB in upper fields Cardiovascular: heart sounds faint, normal s1/s2, 2+  peripheral pulse, cap refill <2s, no appreciable JVD, trace peripheral edema Abdomen: NTND to light and deep palpation, soft, no hepatomegaly   Extremities:  L cubital PIV leaking being fixed, SCDs in place, no rashes appreciated  Neuro: Moves extremities at least against gravity - not formally assessed GU: deferred   Labs/imaging that I havepersonally reviewed  (right click and "Reselect all SmartList Selections" daily)  CBC showing evidence of hemodilution Cr improving LFTs improving Corrected Ca2+: 8.8  ScvO2 remains marginal at 55.7 EKG demonstrates complete AV block, potential T-wave inversions in V5 when paced; other leads didn't have accessible window   Resolved Hospital Problem list   Endotool d/c (c/f DKA given acute illness but beta-hydroxybutyric never elevated)   Assessment & Plan:  55 y.o. female in guarded but improved condition with a relevant PMHx of type-1 insulin-dependent DM, HTN, CAD, CKD III, left ankle arthrodesis 2/2 Charcot joint (2021), requiring continued management in ICU for inferior MI c/b complete AV block and AKI. Future catherization planned after AKI resolves.   AKI on CKDIII AKI likely 2/2 prerenal etiology 2/2 inferior MI exacerbated by underlying CKD. Regaining function. Will determine if we can diuresis and avoid CRRT. Currently no indicated for HD as suspected hypervolemia (by hemodilution and hypoNa) is not clinically apparent.  - Oliguric (~0.25 - 0.3 cc/kg/hr) but Cr down-trending - Lasix 100 mg once  - Determine response and Coox this afternoon (1400) and reassess  - Maintain MAP > 70 for optimal kidney perfusion  - Continue to trend renal function  CHB a/w Inferior MI  - Trial of d/c dobutamine     - If Coox or Cr worsens, resume at 2.5 mcg/kg/min - Coox this afternoon (1400)  - Continue TPVM  - Suspect potential recover of AV node functionality and avoidance of PPM  - Resume high-intensity statin once LFTs normalize  - Secondary prevention: Hold BBlocker and ACE-Iblockade for now  - Trend K and Mg while diuresing   Shock Liver vs Congestive Coagulopathy Given only modest increase in  bilirubin and PT, this is likely a picture of congestive coagulopathy versus shock liver.  - LFTs improved - Trend PT for further diagnostic clarity  - CTM  - As above, resume statin once LFTs normalize   Metabolic Encephalopathy - Improved mentation this morning - CTM but suspected to continue to improve w/ diuresis  Dyspepsia - Maalox PRN - Metoclopramide PRN for refractory n/v   T1DM  - Continue w/ Levemir and lispro SSI regimen - BG within goal range  - CTM BG   Best practice (right click and "Reselect all SmartList Selections" daily)  Diet:  Oral Pain/Anxiety/Delirium protocol (if indicated): No VAP protocol (if indicated): Not indicated DVT prophylaxis: Systemic AC GI prophylaxis: N/A Glucose control:  SSI Yes and Basal insulin Yes Central venous access:  N/A Arterial line:  N/A Foley:  N/A Mobility:  bed rest  PT consulted: Yes Last date of multidisciplinary goals of care discussion [Per primary team] Code Status: Full code Disposition: ICU   Labs   CBC: Recent Labs  Lab 12/19/20 0532 12/19/20 0724 12/19/20 2302 12/19/20 2314 12/20/20 0202 12/21/20 0328  WBC 11.7*  --   --   --  13.0* 11.4*  HGB 12.3 12.2 9.5* 11.9* 10.9* 10.5*  HCT 38.5 36.0 28.0* 35.0* 33.6* 31.5*  MCV 93.7  --   --   --  91.8 90.3  PLT 173  --   --   --  130* 107*  Basic Metabolic Panel: Recent Labs  Lab 12/20/20 0202 12/20/20 0414 12/20/20 0848 12/20/20 1511 12/20/20 2005 12/20/20 2359 12/21/20 0328  NA 133* 133* 133* 135  --   --  133*  K 4.4 4.3 4.3 4.1 4.3 4.5 4.4  CL 102 102 102 104  --   --  101  CO2 14* 14* 20* 21*  --   --  20*  GLUCOSE 293* 242* 208* 85  --   --  156*  BUN 85* 91* 90* 91*  --   --  90*  CREATININE 5.13* 5.23* 4.93* 4.44*  --   --  3.47*  CALCIUM 7.8* 7.9* 8.8* 8.6*  --   --  8.2*  PHOS  --  7.1*  --   --   --   --  4.5   GFR: Estimated Creatinine Clearance: 20.2 mL/min (A) (by C-G formula based on SCr of 3.47 mg/dL (H)). Recent Labs  Lab  12/19/20 0532 12/20/20 0202 12/21/20 0328  WBC 11.7* 13.0* 11.4*    Liver Function Tests: Recent Labs  Lab 12/19/20 0532 12/19/20 1825 12/20/20 0414 12/20/20 0415 12/21/20 0328  AST 3,285* 5,434*  --  4,893* 2,521*  ALT 1,814* 2,839*  --  2,931* 2,551*  ALKPHOS 139* 124  --  114 125  BILITOT 2.4* 2.5*  --  2.0* 1.8*  PROT 7.0 6.0*  --  5.6* 5.4*  ALBUMIN 4.0 3.5 3.4* 3.4* 3.3*   Recent Labs  Lab 12/19/20 0532  LIPASE 20   No results for input(s): AMMONIA in the last 168 hours.  ABG    Component Value Date/Time   PHART 7.306 (L) 12/19/2020 2302   PCO2ART 22.4 (L) 12/19/2020 2302   PO2ART 119 (H) 12/19/2020 2302   HCO3 11.2 (L) 12/19/2020 2302   TCO2 15 (L) 12/19/2020 2314   ACIDBASEDEF 14.0 (H) 12/19/2020 2302   O2SAT 55.7 12/20/2020 1541     Coagulation Profile: Recent Labs  Lab 12/19/20 0739  INR 2.3*    Cardiac Enzymes: No results for input(s): CKTOTAL, CKMB, CKMBINDEX, TROPONINI in the last 168 hours.  HbA1C: Hgb A1c MFr Bld  Date/Time Value Ref Range Status  12/19/2020 11:54 AM 7.8 (H) 4.8 - 5.6 % Final    Comment:    (NOTE) Pre diabetes:          5.7%-6.4%  Diabetes:              >6.4%  Glycemic control for   <7.0% adults with diabetes     CBG: Recent Labs  Lab 12/20/20 2022 12/20/20 2219 12/20/20 2359 12/21/20 0328 12/21/20 0649  GLUCAP 127* 135* 157* 153* 135*    Review of Systems:     Past Medical History:  She,  has a past medical history of Asthma, Complication of anesthesia, Diabetes mellitus, Diabetic macular edema ([place]), and Seasonal allergies.   Surgical History:   Past Surgical History:  Procedure Laterality Date  . BACK SURGERY  2009  . CATARACT SURGERY     BILATERAL  . CESAREAN SECTION     X2  . KNEE SURGERY Right   . LEFT HEART CATH AND CORONARY ANGIOGRAPHY N/A 05/08/2017   Procedure: Left Heart Cath and Coronary Angiography;  Surgeon: Nigel Mormon, MD;  Location: [place] [facility];  Service:  Cardiovascular;  Laterality: N/A;  . ORIF ANKLE FRACTURE Left 03/23/2016   Procedure: OPEN REDUCTION INTERNAL FIXATION (ORIF) ANKLE FRACTURE MALLEOLUS  MALUNION WITH TIBIAL AND FIBULAR OSTEOTOMY AND AUTOGRAFT;  Surgeon: Jenny Reichmann  Doran Durand, MD;  Location: [place];  Service: Orthopedics;  Laterality: Left;  . SHOULDER ARTHROSCOPY W/ ROTATOR CUFF REPAIR Left   . TEMPORARY PACEMAKER N/A 12/19/2020   Procedure: TEMPORARY PACEMAKER;  Surgeon: Nigel Mormon, MD;  Location: [place] [facility];  Service: Cardiovascular;  Laterality: N/A;  . TEMPORARY PACEMAKER N/A 12/19/2020   Procedure: TEMPORARY PACEMAKER;  Surgeon: Nigel Mormon, MD;  Location: [place] [facility];  Service: Cardiovascular;  Laterality: N/A;     Social History:   reports that she quit smoking about 33 years ago. Her smoking use included cigarettes. She has a 0.50 pack-year smoking history. She has never used smokeless tobacco. She reports current alcohol use. She reports that she does not use drugs.   Family History:  Her family history includes Hypertension in her father, paternal aunt, paternal grandfather, and paternal grandmother.   Allergies Allergies  Allergen Reactions  . Other Other (See Comments)  . Succinylcholine Other (See Comments)    Pseudocholinesterase deficiency     Home Medications  Prior to Admission medications   Medication Sig Start Date End Date Taking? Authorizing Provider  albuterol (PROVENTIL HFA;VENTOLIN HFA) 108 (90 Base) MCG/ACT inhaler Inhale 1-2 puffs into the lungs every 6 (six) hours as needed for wheezing or shortness of breath.    [provider]  amLODipine (NORVASC) 5 MG tablet Take 1 tablet (5 mg total) by mouth every evening. 09/29/20 12/28/20  Adrian Prows, MD  Cholecalciferol (VITAMIN D) 2000 units tablet Take 2,000 Units by mouth daily.    [provider]  ferrous sulfate 325 (65 FE) MG tablet Take 325 mg by mouth daily with breakfast.    [provider]  furosemide (LASIX) 40 MG tablet Take 40 mg by mouth as needed for edema.  03/14/17   [provider]  insulin lispro (HUMALOG) 100 UNIT/ML injection Inject into the skin See admin instructions. Insulin Pump - Mini Medtronic 670G    [provider]  lisinopril (ZESTRIL) 5 MG tablet Take 1 tablet by mouth daily. 05/19/19   [provider]  loratadine (CLARITIN) 10 MG tablet Take 1 tablet by mouth daily as needed.    [provider]  metoprolol succinate (TOPROL-XL) 50 MG 24 hr tablet TAKE 1 TABLET BY MOUTH  DAILY 07/08/20   Adrian Prows, MD  rosuvastatin (CRESTOR) 10 MG tablet Take 10 mg by mouth at bedtime. 06/17/20   [provider]  tobramycin (TOBREX) 0.3 % ophthalmic solution Place 2 drops into both eyes as needed (prior to injections).  12/03/18   [provider]     Critical care time: 35 minutes       Domenick Bookbinder, MS4

## 2020-12-21 NOTE — Plan of Care (Signed)
  Problem: Health Behavior/Discharge Planning: Goal: Ability to manage health-related needs will improve Outcome: Progressing   Problem: Clinical Measurements: Goal: Respiratory complications will improve Outcome: Progressing   Problem: Clinical Measurements: Goal: Cardiovascular complication will be avoided Outcome: Progressing

## 2020-12-21 NOTE — Progress Notes (Signed)
[place] for heparin Indication: chest pain/ACS  Allergies  Allergen Reactions  . Other Other (See Comments)  . Succinylcholine Other (See Comments)    Pseudocholinesterase deficiency    Patient Measurements: Height: 5\' 3"  (160 cm) Weight: 97.9 kg (215 lb 13.3 oz) IBW/kg (Calculated) : 52.4 Heparin Dosing Weight: 73.7 kg  Vital Signs: Temp: 98.1 F (36.7 C) (03/29 0000) Temp Source: Oral (03/29 0000) BP: 101/69 (03/29 0200) Pulse Rate: 71 (03/29 0600)  Labs: Recent Labs    12/19/20 0532 12/19/20 0715 12/19/20 0724 12/19/20 0739 12/19/20 1154 12/19/20 1825 12/19/20 2314 12/20/20 0202 12/20/20 0414 12/20/20 0848 12/20/20 0900 12/20/20 1511 12/20/20 1853 12/20/20 2359 12/21/20 0328  HGB 12.3  --    < >  --   --    < > 11.9* 10.9*  --   --   --   --   --   --  10.5*  HCT 38.5  --    < >  --   --    < > 35.0* 33.6*  --   --   --   --   --   --  31.5*  PLT 173  --   --   --   --   --   --  130*  --   --   --   --   --   --  107*  APTT  --   --   --  31  --   --   --   --   --   --   --   --   --   --   --   LABPROT  --   --   --  24.7*  --   --   --   --   --   --   --   --   --   --   --   INR  --   --   --  2.3*  --   --   --   --   --   --   --   --   --   --   --   HEPARINUNFRC  --   --   --   --   --    < >  --   --   --   --  0.13*  --  0.21* 0.13*  --   CREATININE 4.34*  --    < >  --   --    < > 5.00* 5.13*   < > 4.93*  --  4.44*  --   --  3.47*  TROPONINIHS  --  75,829*  --   --  >27,000*  --   --   --   --   --   --   --   --   --   --    < > = values in this interval not displayed.    Estimated Creatinine Clearance: 20.2 mL/min (A) (by C-G formula based on SCr of 3.47 mg/dL (H)).  Assessment: 55 y.o. female with heart block and possible ACS for heparin. Suspicion for ACS, elevated troponins, but cath deferred due to elevated SCr.  Had temporary pacer placed.  8-hr follow up HL therapeutic at 0.40. No s/sx bleeding,  CBC stable.  Goal of Therapy:  Heparin level 0.3-0.7 units/ml Monitor platelets by anticoagulation protocol: Yes   Plan:  Continue IV heparin infusion at 1,650 units/hr  Follow daily HL, CBC, s/sx bleeding  Mercy Riding, PharmD PGY1 Acute Care Pharmacy Resident Please refer to [facility] for unit-specific pharmacist

## 2020-12-22 DIAGNOSIS — I4430 Unspecified atrioventricular block: Secondary | ICD-10-CM | POA: Diagnosis not present

## 2020-12-22 DIAGNOSIS — I442 Atrioventricular block, complete: Secondary | ICD-10-CM | POA: Diagnosis not present

## 2020-12-22 DIAGNOSIS — N179 Acute kidney failure, unspecified: Secondary | ICD-10-CM | POA: Diagnosis not present

## 2020-12-22 LAB — COMPREHENSIVE METABOLIC PANEL
ALT: 1831 U/L — ABNORMAL HIGH (ref 0–44)
AST: 1235 U/L — ABNORMAL HIGH (ref 15–41)
Albumin: 3.1 g/dL — ABNORMAL LOW (ref 3.5–5.0)
Alkaline Phosphatase: 122 U/L (ref 38–126)
Anion gap: 8 (ref 5–15)
BUN: 83 mg/dL — ABNORMAL HIGH (ref 6–20)
CO2: 23 mmol/L (ref 22–32)
Calcium: 7.9 mg/dL — ABNORMAL LOW (ref 8.9–10.3)
Chloride: 101 mmol/L (ref 98–111)
Creatinine, Ser: 2.29 mg/dL — ABNORMAL HIGH (ref 0.44–1.00)
GFR, Estimated: 24 mL/min — ABNORMAL LOW (ref >60)
Glucose, Bld: 96 mg/dL (ref 70–99)
Potassium: 3.8 mmol/L (ref 3.5–5.1)
Sodium: 132 mmol/L — ABNORMAL LOW (ref 135–145)
Total Bilirubin: 1.6 mg/dL — ABNORMAL HIGH (ref 0.3–1.2)
Total Protein: 5.3 g/dL — ABNORMAL LOW (ref 6.5–8.1)

## 2020-12-22 LAB — GLUCOSE, CAPILLARY
Glucose-Capillary: 109 mg/dL — ABNORMAL HIGH (ref 70–99)
Glucose-Capillary: 136 mg/dL — ABNORMAL HIGH (ref 70–99)
Glucose-Capillary: 146 mg/dL — ABNORMAL HIGH (ref 70–99)
Glucose-Capillary: 156 mg/dL — ABNORMAL HIGH (ref 70–99)
Glucose-Capillary: 164 mg/dL — ABNORMAL HIGH (ref 70–99)
Glucose-Capillary: 168 mg/dL — ABNORMAL HIGH (ref 70–99)
Glucose-Capillary: 173 mg/dL — ABNORMAL HIGH (ref 70–99)
Glucose-Capillary: 70 mg/dL (ref 70–99)
Glucose-Capillary: 94 mg/dL (ref 70–99)

## 2020-12-22 LAB — CBC
HCT: 32.4 % — ABNORMAL LOW (ref 36.0–46.0)
Hemoglobin: 10.5 g/dL — ABNORMAL LOW (ref 12.0–15.0)
MCH: 29.2 pg (ref 26.0–34.0)
MCHC: 32.4 g/dL (ref 30.0–36.0)
MCV: 90.3 fL (ref 80.0–100.0)
Platelets: 102 10*3/uL — ABNORMAL LOW (ref 150–400)
RBC: 3.59 MIL/uL — ABNORMAL LOW (ref 3.87–5.11)
RDW: 13.2 % (ref 11.5–15.5)
WBC: 9.4 10*3/uL (ref 4.0–10.5)
nRBC: 0.9 % — ABNORMAL HIGH (ref 0.0–0.2)

## 2020-12-22 LAB — HEPARIN LEVEL (UNFRACTIONATED): Heparin Unfractionated: 0.7 IU/mL (ref 0.30–0.70)

## 2020-12-22 LAB — PHOSPHORUS: Phosphorus: 3.1 mg/dL (ref 2.5–4.6)

## 2020-12-22 MED ORDER — MAGNESIUM SULFATE 2 GM/50ML IV SOLN
2.0000 g | Freq: Once | INTRAVENOUS | Status: AC
  Administered 2020-12-22: 2 g via INTRAVENOUS
  Filled 2020-12-22: qty 50

## 2020-12-22 MED ORDER — INSULIN DETEMIR 100 UNIT/ML ~~LOC~~ SOLN
18.0000 [IU] | Freq: Two times a day (BID) | SUBCUTANEOUS | Status: DC
  Administered 2020-12-22 – 2020-12-30 (×15): 18 [IU] via SUBCUTANEOUS
  Filled 2020-12-22 (×18): qty 0.18

## 2020-12-22 MED ORDER — NITROGLYCERIN IN D5W 200-5 MCG/ML-% IV SOLN
0.0000 ug/min | INTRAVENOUS | Status: DC
  Administered 2020-12-22: 5 ug/min via INTRAVENOUS
  Filled 2020-12-22: qty 250

## 2020-12-22 MED ORDER — INSULIN ASPART 100 UNIT/ML ~~LOC~~ SOLN
0.0000 [IU] | Freq: Three times a day (TID) | SUBCUTANEOUS | Status: DC
  Administered 2020-12-22: 2 [IU] via SUBCUTANEOUS
  Administered 2020-12-23: 5 [IU] via SUBCUTANEOUS
  Administered 2020-12-23: 2 [IU] via SUBCUTANEOUS
  Administered 2020-12-23: 3 [IU] via SUBCUTANEOUS
  Administered 2020-12-24: 1 [IU] via SUBCUTANEOUS
  Administered 2020-12-25: 3 [IU] via SUBCUTANEOUS
  Administered 2020-12-25: 2 [IU] via SUBCUTANEOUS
  Administered 2020-12-25: 5 [IU] via SUBCUTANEOUS
  Administered 2020-12-26: 2 [IU] via SUBCUTANEOUS
  Administered 2020-12-27: 3 [IU] via SUBCUTANEOUS
  Administered 2020-12-28: 1 [IU] via SUBCUTANEOUS
  Administered 2020-12-28: 3 [IU] via SUBCUTANEOUS
  Administered 2020-12-28: 2 [IU] via SUBCUTANEOUS
  Administered 2020-12-29: 3 [IU] via SUBCUTANEOUS
  Administered 2020-12-29: 2 [IU] via SUBCUTANEOUS

## 2020-12-22 MED ORDER — FUROSEMIDE 10 MG/ML IJ SOLN
80.0000 mg | Freq: Once | INTRAMUSCULAR | Status: AC
  Administered 2020-12-22: 80 mg via INTRAVENOUS
  Filled 2020-12-22: qty 8

## 2020-12-22 MED ORDER — INSULIN ASPART 100 UNIT/ML ~~LOC~~ SOLN
0.0000 [IU] | Freq: Every day | SUBCUTANEOUS | Status: DC
  Administered 2020-12-24 – 2020-12-25 (×2): 2 [IU] via SUBCUTANEOUS
  Administered 2020-12-26 – 2020-12-27 (×2): 3 [IU] via SUBCUTANEOUS
  Administered 2020-12-28 – 2020-12-29 (×2): 2 [IU] via SUBCUTANEOUS

## 2020-12-22 MED ORDER — POTASSIUM CHLORIDE CRYS ER 20 MEQ PO TBCR
20.0000 meq | EXTENDED_RELEASE_TABLET | Freq: Two times a day (BID) | ORAL | Status: AC
  Administered 2020-12-22 (×2): 20 meq via ORAL
  Filled 2020-12-22 (×2): qty 1

## 2020-12-22 MED ORDER — LIP MEDEX EX OINT
TOPICAL_OINTMENT | CUTANEOUS | Status: DC | PRN
  Filled 2020-12-22: qty 7

## 2020-12-22 NOTE — Progress Notes (Signed)
Inpatient Diabetes Program Recommendations  AACE/ADA: New Consensus Statement on Inpatient Glycemic Control   Target Ranges:  Prepandial:   less than 140 mg/dL      Peak postprandial:   less than 180 mg/dL (1-2 hours)      Critically ill patients:  140 - 180 mg/dL   Results for IVIS, NICOLSON (MRN [id number]) as of 12/22/2020 09:42  Ref. Range 12/21/2020 06:49 12/21/2020 11:12 12/21/2020 15:34 12/21/2020 19:37 12/21/2020 20:30 12/21/2020 21:32 12/21/2020 23:44 12/22/2020 03:21 12/22/2020 07:08 12/22/2020 08:42  Glucose-Capillary Latest Ref Range: 70 - 99 mg/dL 135 (H) 109 (H) 94 68 (L) 71 111 (H) 109 (H) 94 70 146 (H)   Review of Glycemic Control  Diabetes history: DM1 (makes NO  Outpatient Diabetes medications: Medtronic Insulin Pump 670G with Humalog insulin Current orders for Inpatient glycemic control: Levemir 24 units Q12H, Novolog 1-3 units Q4H  Inpatient Diabetes Program Recommendations:    Insulin: Please consider decreasing Levemir to 18 units BID. If patient is eating well, discontinue Novolog 1-3 units and use Glycemic Control order set to order Novolog 0-9 units TID with meals and Novolog 0-5 units QHS.  Thanks, Barnie Alderman, RN, MSN, CDE Diabetes Coordinator Inpatient Diabetes Program [phone] (Team Pager from 8am to 5pm)

## 2020-12-22 NOTE — Progress Notes (Signed)
[place] for heparin Indication: chest pain/ACS  Allergies  Allergen Reactions  . Other Other (See Comments)  . Succinylcholine Other (See Comments)    Pseudocholinesterase deficiency    Patient Measurements: Height: 5\' 3"  (160 cm) Weight: 97 kg (213 lb 13.5 oz) IBW/kg (Calculated) : 52.4 Heparin Dosing Weight: 73.7 kg  Vital Signs: Temp: 98 F (36.7 C) (03/30 0844) Temp Source: Oral (03/30 0844) BP: 116/74 (03/30 1000) Pulse Rate: 50 (03/30 1000)  Labs: Recent Labs    12/19/20 1154 12/19/20 1825 12/20/20 0202 12/20/20 0414 12/20/20 1511 12/20/20 1853 12/20/20 2359 12/21/20 0328 12/21/20 1012 12/22/20 0318  HGB  --    < > 10.9*  --   --   --   --  10.5*  --  10.5*  HCT  --    < > 33.6*  --   --   --   --  31.5*  --  32.4*  PLT  --   --  130*  --   --   --   --  107*  --  102*  LABPROT  --   --   --   --   --   --   --   --  21.3*  --   INR  --   --   --   --   --   --   --   --  1.9*  --   HEPARINUNFRC  --    < >  --    < >  --    < > 0.13*  --  0.40 0.70  CREATININE  --    < > 5.13*   < > 4.44*  --   --  3.47*  --  2.29*  TROPONINIHS >27,000*  --   --   --   --   --   --   --   --   --    < > = values in this interval not displayed.    Estimated Creatinine Clearance: 30.4 mL/min (A) (by C-G formula based on SCr of 2.29 mg/dL (H)).  Assessment: 55 y.o. female with heart block and possible ACS for heparin. Suspicion for ACS, elevated troponins, but cath deferred due to elevated SCr.  Had temporary pacer placed.  AM HL therapeutic at 0.7. No s/sx bleeding, CBC stable.  Goal of Therapy:  Heparin level 0.3-0.7 units/ml Monitor platelets by anticoagulation protocol: Yes   Plan:  Continue IV heparin infusion at 1,650 units/hr Monitor daily HL, CBC, s/sx bleeding Follow plans for cath (likely 3/31)  Mercy Riding, PharmD PGY1 Acute Care Pharmacy Resident Please refer to [facility] for unit-specific pharmacist

## 2020-12-22 NOTE — Progress Notes (Addendum)
Progress Note  Patient Name: Colleen [name] Date of Encounter: 12/22/2020  [facility] Cardiologist: Dr. Einar Gip  Subjective   "Im doing OK", has some intermittent nausea, no CP or SOB  Inpatient Medications    Scheduled Meds: . alum & mag hydroxide-simeth  30 mL Oral Once  . aspirin EC  81 mg Oral Daily  . Chlorhexidine Gluconate Cloth  6 each Topical Daily  . fentaNYL (SUBLIMAZE) injection  25 mcg Intravenous Once  . furosemide  80 mg Intravenous Once  . insulin aspart  1-3 Units Subcutaneous Q4H  . insulin detemir  24 Units Subcutaneous Q12H  . sodium chloride flush  10-40 mL Intracatheter Q12H   Continuous Infusions: . heparin 1,650 Units/hr (12/22/20 0750)  . magnesium sulfate bolus IVPB     PRN Meds: acetaminophen, alum & mag hydroxide-simeth, dextrose, lip balm, metoCLOPramide, nitroGLYCERIN, ondansetron (ZOFRAN) IV, oxyCODONE, sodium chloride flush   Vital Signs    Vitals:   12/22/20 0500 12/22/20 0600 12/22/20 0700 12/22/20 0844  BP: 120/79 109/78 113/79   Pulse: 60 60 60   Resp: 12 12 13    Temp:    98 F (36.7 C)  TempSrc:    Oral  SpO2: 99% 99% 98%   Weight:      Height:        Intake/Output Summary (Last 24 hours) at 12/22/2020 0916 Last data filed at 12/22/2020 0750 Gross per 24 hour  Intake 1326.8 ml  Output 2775 ml  Net -1448.2 ml   Last 3 Weights 12/22/2020 12/20/2020 12/19/2020  Weight (lbs) 213 lb 13.5 oz 215 lb 13.3 oz 205 lb  Weight (kg) 97 kg 97.9 kg 92.987 kg      Telemetry    V pacing, when Dr. Virgina Jock checked noted some 2:1 and later this AM she had some SR about 60bpm with 1:1 conduction 1st degree AVB - Personally Reviewed  ECG    CHB paced beats - Personally Reviewed  Physical Exam   Exam is unchanged, though she looks like she feels a bit better GEN: No acute distress.   Neck: No JVD Cardiac: RRR, no murmurs, rubs, or gallops.  Respiratory: Clear to auscultation bilaterally. GI: Soft, nontender, non-distended   MS: No edema; No deformity. Neuro:  Nonfocal  Psych: Normal affect   Labs    High Sensitivity Troponin:   Recent Labs  Lab 12/19/20 0715 12/19/20 1154  TROPONINIHS 75,829* >27,000*      Chemistry Recent Labs  Lab 12/20/20 0415 12/20/20 0848 12/20/20 1511 12/20/20 2005 12/21/20 0328 12/21/20 1034 12/21/20 1152 12/21/20 1605 12/22/20 0318  NA  --    < > 135  --  133*  --   --   --  132*  K  --    < > 4.1   < > 4.4   < > 4.3 3.9 3.8  CL  --    < > 104  --  101  --   --   --  101  CO2  --    < > 21*  --  20*  --   --   --  23  GLUCOSE  --    < > 85  --  156*  --   --   --  96  BUN  --    < > 91*  --  90*  --   --   --  83*  CREATININE  --    < > 4.44*  --  3.47*  --   --   --  2.29*  CALCIUM  --    < > 8.6*  --  8.2*  --   --   --  7.9*  PROT 5.6*  --   --   --  5.4*  --   --   --  5.3*  ALBUMIN 3.4*  --   --   --  3.3*  --   --   --  3.1*  AST 4,893*  --   --   --  2,521*  --   --   --  1,235*  ALT 2,931*  --   --   --  2,551*  --   --   --  1,831*  ALKPHOS 114  --   --   --  125  --   --   --  122  BILITOT 2.0*  --   --   --  1.8*  --   --   --  1.6*  GFRNONAA  --    < > 11*  --  15*  --   --   --  24*  ANIONGAP  --    < > 10  --  12  --   --   --  8   < > = values in this interval not displayed.     Hematology Recent Labs  Lab 12/20/20 0202 12/21/20 0328 12/22/20 0318  WBC 13.0* 11.4* 9.4  RBC 3.66* 3.49* 3.59*  HGB 10.9* 10.5* 10.5*  HCT 33.6* 31.5* 32.4*  MCV 91.8 90.3 90.3  MCH 29.8 30.1 29.2  MCHC 32.4 33.3 32.4  RDW 12.9 13.2 13.2  PLT 130* 107* 102*    BNP Recent Labs  Lab 12/21/20 1012  BNP 1,323.7*     DDimer No results for input(s): DDIMER in the last 168 hours.   Radiology    DG Chest 1 View  Result Date: 12/20/2020 CLINICAL DATA:  Transvenous pacemaker placement EXAM: CHEST  1 VIEW COMPARISON:  09/25/2011 FINDINGS: Inferiorly approaching trans venous pacemaker lead is seen overlying the expected right ventricle. Lung volumes are  small, but are symmetric and are clear. Right internal jugular central venous catheter tip overlies the expected innominate vein. No pneumothorax or pleural effusion. Cardiac size is within normal limits. Pulmonary vascularity is normal. No acute bone abnormality. IMPRESSION: Inferiorly approaching trans venous pacemaker lead overlies the expected right ventricle. Electronically Signed   By: Fidela Salisbury MD   On: 12/20/2020 00:03     Cardiac Studies   12/19/20; TTE IMPRESSIONS  1. Left ventricular ejection fraction, by estimation, is 55 to 60%. The  left ventricle has normal function. The left ventricle demonstrates  regional wall motion abnormalities (see scoring diagram/findings for  description). Left ventricular diastolic  parameters are indeterminate. There is mild hypokinesis of the left  ventricular, basal inferolateral wall.  2. Right ventricular systolic function is moderately reduced. The right  ventricular size is moderately enlarged. Estimated PASP 33 mmHg.  3. Right atrial size was mildly dilated.  4. Tricuspid valve regurgitation is severe.  5. The inferior vena cava is normal in size with <50% respiratory  variability, suggesting right atrial pressure of 8 mmHg.  6. Comapared to previous study report in 2018, LV/RV wall motion  abnormalities are new.   Patient Profile     55 y.o. female with a hx of Type I DM   Admitted about 3 days of N/V, had a syncopal event the day of her admission, she was found w/inferior STEMI and CHB  Planned for emergent cath and temp wire though her creat came back 4.3 and bedside echo noted presevred LVEF and LHC was deferred and temp pacing wire was placed. She did have RV dysfunction and suspected that her infarct likely started Friday with her symptoms. Trops >75000  Assessment & Plan    1. CHB (no underlying escape)     In setting of an acute inferior STEMI/MI     RV failure     AKI - creat down somemore     Abn LFTs -trending  down      DKA - management with CCM      This AM she had CHB and then some 2:1 block with pacing held. She has had as well a few brief periods of 1:1 conduction 60-62bpm this AM as well  D/w Dr. Virgina Jock, reduced pacing rate to 50 Pt had some nausea, he suspect sis the LFTs/hepatitis from her MI I revisited with the pt discussed hopeflly her conduction Javi Bollman continue to improve and we [place] hold off on pacer today Pacing wire appears stabe  Would follow  For questions or updates, please contact [facility] Please consult [email or web address] for contact info under   Signed, Baldwin Jamaica, PA-C  12/22/2020, 9:16 AM    I have seen and examined this patient with Tommye Standard.  Agree with above, note added to reflect my findings.  On exam, RRR, no murmurs. Continued heart block. There are reports of intermittent 1:1 conduction. Would continue to hold off on pacemaker implant. If she remains in CHB, may consider temp-perm device and watch over the weekend with possible implant next week.    Ottie Neglia M. Brylee Berk MD 12/22/2020 12:05 PM

## 2020-12-22 NOTE — Progress Notes (Signed)
[facility] Progress Note   Assessment/ Plan: 1. AKI on CKD 3 - baseline creat 1.2- 1.4 from dec 2021, CKD stage 3. . Renal US w/o obstruction. UA unremarkable.  AKI in setting of cardiogenic shock.  She's improving with supportive care and there are no indications for RRT at this time.  Appears fairly euvolemic currently.  Can use diuretics PRN to maintain euvolemia.  Avoid contrast as able in setting of AKI.  I discussed with her the risk for CIN and use of peri contrast volume expansion to mitigate that risk when she undergoes her catheterization later this hospitalization.    2. Acute MI - trop 75K, per cardiology suspecting RV infarct. Holding off on heart cath for now given AKI but planned during admission. 3. Shock - cardiogenic from #4, shock now resolved. 4. Complete AV block - May need PPM - EP is following.  5. DM type 1: per primary 6. Hepatitis:  likely secondary to shock. Trending down  I will sign off - pt aware of risk for CIN and need for pericath hydration which I will defer to cardiology.  Please don't hesitate to call us back if she develops recurrent AKI or other questions arise.   Jannifer Hick MD 12/20/2020, 8:17 AM  [facility] Pager: [phone] _________________________________________________________  Background on initial C/S: HPI: The patient is a 55 y.o. year-old w/ hx of T1DM on insulin pump, hx mild CAD presented w/ 3d hx N/V then had syncopal episode. In ED EKG showed inf ST elevation. Cardiology consulted for possible AV block.  Creat 4.0.  Pt went to cath lab for temp pacemaker today. Asked to see for renal failure.   Pt seen in ICU.  Pt is f/u Dr Hollie Salk, per husband she has stage 3 CKD, baseline creat around 1.3.  Gives hx of N/V for 2-3 days. Denies CP. Has neuropathic LE's, per husband most of the L lower leg is "made of metal". No active SOB or cough, no abd pain. No fevers.   Subjective:   No overnight events UOP 2.2L.   Cr further improved to 2.3 today. No new issues.  Objective Vitals:   12/22/20 0315 12/22/20 0400 12/22/20 0500 12/22/20 0600  BP:  132/86 120/79 109/78  Pulse:  60 60 60  Resp:  12 12 12   Temp: 98.8 F (37.1 C)     TempSrc: Oral     SpO2:  99% 99% 99%  Weight:      Height:       Physical Exam General: lying flat in bed on RA Heart: paced in 70s, no rub Lungs: clear, normal WOB Abdomen:soft NABS Extremities: no edema  Dialysis Access: none GU:  Foley with clear yellow urine  Additional Objective Labs: Basic Metabolic Panel: Recent Labs  Lab 12/20/20 0414 12/20/20 0848 12/20/20 1511 12/20/20 2005 12/21/20 0328 12/21/20 1034 12/21/20 1152 12/21/20 1605 12/22/20 0318  NA 133*   < > 135  --  133*  --   --   --  132*  K 4.3   < > 4.1   < > 4.4   < > 4.3 3.9 3.8  CL 102   < > 104  --  101  --   --   --  101  CO2 14*   < > 21*  --  20*  --   --   --  23  GLUCOSE 242*   < > 85  --  156*  --   --   --  96  BUN 91*   < > 91*  --  90*  --   --   --  83*  CREATININE 5.23*   < > 4.44*  --  3.47*  --   --   --  2.29*  CALCIUM 7.9*   < > 8.6*  --  8.2*  --   --   --  7.9*  PHOS 7.1*  --   --   --  4.5  --   --   --  3.1   < > = values in this interval not displayed.   Liver Function Tests: Recent Labs  Lab 12/20/20 0415 12/21/20 0328 12/22/20 0318  AST 4,893* 2,521* 1,235*  ALT 2,931* 2,551* 1,831*  ALKPHOS 114 125 122  BILITOT 2.0* 1.8* 1.6*  PROT 5.6* 5.4* 5.3*  ALBUMIN 3.4* 3.3* 3.1*   Recent Labs  Lab 12/19/20 0532  LIPASE 20   CBC: Recent Labs  Lab 12/19/20 0532 12/19/20 0724 12/20/20 0202 12/21/20 0328 12/22/20 0318  WBC 11.7*  --  13.0* 11.4* 9.4  HGB 12.3   < > 10.9* 10.5* 10.5*  HCT 38.5   < > 33.6* 31.5* 32.4*  MCV 93.7  --  91.8 90.3 90.3  PLT 173  --  130* 107* 102*   < > = values in this interval not displayed.   Blood Culture No results found for: SDES, SPECREQUEST, CULT, REPTSTATUS  Cardiac Enzymes: No results for input(s):  CKTOTAL, CKMB, CKMBINDEX, TROPONINI in the last 168 hours. CBG: Recent Labs  Lab 12/21/20 1937 12/21/20 2030 12/21/20 2132 12/21/20 2344 12/22/20 0321  GLUCAP 68* 71 111* 109* 94   Iron Studies: No results for input(s): IRON, TIBC, TRANSFERRIN, FERRITIN in the last 72 hours. @lablastinr3 @ Studies/Results: DG CHEST PORT 1 VIEW  Result Date: 12/20/2020 CLINICAL DATA:  Temporary pacemaker. EXAM: PORTABLE CHEST 1 VIEW COMPARISON:  12/19/2020. FINDINGS: Trachea is midline. Heart is enlarged, stable. Thoracic aorta is calcified. Right IJ central line tip is in the brachiocephalic vein junction region. Transvenous pacemaker is seen from an IVC approach with tip projecting over the right ventricle. Defect related pads are in place. Lungs are somewhat low in volume with minimal bibasilar subsegmental atelectasis. No pleural fluid. IMPRESSION: Low lung volumes with bibasilar subsegmental atelectasis. Electronically Signed   By: Lorin Picket M.D.   On: 12/20/2020 10:00   Medications: . DOBUTamine Stopped (12/21/20 4696)  . heparin 1,650 Units/hr (12/22/20 0510)   . alum & mag hydroxide-simeth  30 mL Oral Once  . aspirin EC  81 mg Oral Daily  . Chlorhexidine Gluconate Cloth  6 each Topical Daily  . fentaNYL (SUBLIMAZE) injection  25 mcg Intravenous Once  . insulin aspart  1-3 Units Subcutaneous Q4H  . insulin detemir  24 Units Subcutaneous Q12H  . sodium chloride flush  10-40 mL Intracatheter Q12H

## 2020-12-22 NOTE — Plan of Care (Signed)
  Problem: Education: Goal: Knowledge of General Education information will improve Description: Including pain rating scale, medication(s)/side effects and non-pharmacologic comfort measures Outcome: Progressing   Problem: Clinical Measurements: Goal: Will remain free from infection Outcome: Progressing Goal: Diagnostic test results will improve Outcome: Progressing Goal: Respiratory complications will improve Outcome: Progressing Goal: Cardiovascular complication will be avoided Outcome: Progressing   Problem: Activity: Goal: Risk for activity intolerance will decrease Outcome: Progressing   Problem: Elimination: Goal: Will not experience complications related to urinary retention Outcome: Progressing   Problem: Safety: Goal: Ability to remain free from injury will improve Outcome: Progressing

## 2020-12-22 NOTE — Progress Notes (Signed)
Subjective: Feels well No chest pain Had back pain yesterday, resolved with oxycodone (she reportedly also takes it at home)  Blood pressure stable without dobutamine Urine 0ut put 2200 cc in 24 hrs Urine more clear Nephrology and critical care following.  Temporary pacemaker in place    Objective:  Vital Signs in the last 24 hours: Temp:  [97.8 F (36.6 C)-98.8 F (37.1 C)] 98 F (36.7 C) (03/30 0844) Pulse Rate:  [55-69] 60 (03/30 0700) Resp:  [12-20] 13 (03/30 0700) BP: (104-132)/(50-86) 113/79 (03/30 0700) SpO2:  [94 %-99 %] 98 % (03/30 0700) Arterial Line BP: (110-158)/(50-69) 143/69 (03/30 0700) Weight:  [97 kg] 97 kg (03/30 0309)  Intake/Output from previous day: 03/29 0701 - 03/30 0700 In: 1149.6 [P.O.:930; I.V.:159.6; IV Piggyback:60] Out: 2225 [Urine:2225]  Physical Exam Vitals and nursing note reviewed.  Constitutional:      General: She is not in acute distress.    Appearance: She is well-developed. She is not ill-appearing.  HENT:     Head: Normocephalic and atraumatic.  Eyes:     Conjunctiva/sclera: Conjunctivae normal.     Pupils: Pupils are equal, round, and reactive to light.  Neck:     Vascular: No JVD.  Cardiovascular:     Rate and Rhythm: Regular rhythm. Bradycardia present.     Pulses: Normal pulses and intact distal pulses.     Heart sounds: No murmur heard.     Comments: Temporary pacemaker in place through Rt CFV Pulmonary:     Effort: Pulmonary effort is normal.     Breath sounds: Normal breath sounds. No wheezing or rales.  Abdominal:     General: Bowel sounds are normal.     Palpations: Abdomen is soft.     Tenderness: There is no rebound.  Musculoskeletal:        General: No tenderness. Normal range of motion.     Right lower leg: No edema.     Left lower leg: No edema.  Lymphadenopathy:     Cervical: No cervical adenopathy.  Skin:    General: Skin is warm and dry.  Neurological:     Mental Status: She is alert and oriented  to person, place, and time.     Cranial Nerves: No cranial nerve deficit.     Comments: Alert, awake, oriented x3.  However, has periods of somnolence.      Lab Results: BMP Recent Labs    12/20/20 1511 12/20/20 2005 12/21/20 0328 12/21/20 1034 12/21/20 1152 12/21/20 1605 12/22/20 0318  NA 135  --  133*  --   --   --  132*  K 4.1   < > 4.4   < > 4.3 3.9 3.8  CL 104  --  101  --   --   --  101  CO2 21*  --  20*  --   --   --  23  GLUCOSE 85  --  156*  --   --   --  96  BUN 91*  --  90*  --   --   --  83*  CREATININE 4.44*  --  3.47*  --   --   --  2.29*  CALCIUM 8.6*  --  8.2*  --   --   --  7.9*  GFRNONAA 11*  --  15*  --   --   --  24*   < > = values in this interval not displayed.    CBC Recent Labs  Lab 12/22/20 0318  WBC 9.4  RBC 3.59*  HGB 10.5*  HCT 32.4*  PLT 102*  MCV 90.3  MCH 29.2  MCHC 32.4  RDW 13.2    HEMOGLOBIN A1C Lab Results  Component Value Date   HGBA1C 7.8 (H) 12/19/2020   MPG 177.16 12/19/2020    Cardiac Panel (last 3 results) Results for GWENDLYON, ZUMBRO (MRN [id number]) as of 12/20/2020 11:29  Ref. Range 12/19/2020 07:15 12/19/2020 11:54  Troponin I (High Sensitivity) Latest Ref Range: <18 ng/L 75,829 (HH) >27,000 (HH)   BNP (last 3 results) N/A  TSH N/A  Lipid Panel     Component Value Date/Time   CHOL 120 12/20/2020 0415   CHOL 164 10/08/2019 0901   TRIG 96 12/20/2020 0415   HDL 59 12/20/2020 0415   HDL 99 10/08/2019 0901   CHOLHDL 2.0 12/20/2020 0415   VLDL 19 12/20/2020 0415   LDLCALC 42 12/20/2020 0415   LDLCALC 54 10/08/2019 0901   LDLDIRECT 61 10/08/2019 0901     Hepatic Function Panel Recent Labs    12/20/20 0415 12/21/20 0328 12/22/20 0318  PROT 5.6* 5.4* 5.3*  ALBUMIN 3.4* 3.3* 3.1*  AST 4,893* 2,521* 1,235*  ALT 2,931* 2,551* 1,831*  ALKPHOS 114 125 122  BILITOT 2.0* 1.8* 1.6*  BILIDIR 0.5*  --   --   IBILI 1.5*  --   --     Imaging: Chest x-ray 12/12/2020: Trachea is midline. Heart is  enlarged, stable. Thoracic aorta is calcified. Right IJ central line tip is in the brachiocephalic vein junction region. Transvenous pacemaker is seen from an IVC approach with tip projecting over the right ventricle. Defect related pads are in place.  Lungs are somewhat low in volume with minimal bibasilar subsegmental atelectasis. No pleural fluid.  IMPRESSION: Low lung volumes with bibasilar subsegmental atelectasis.  CT head 12/19/2020: Normal appearance of the brain.  Cardiac Studies:  EKG 12/21/2020: Sinus rhythm with 2:1 AV block Ventricular rate 42 bpm Inferior infarct, likely recent  EKG 12/20/2020: Sinus rhythm with complete AV block.   Asystole without ventricular escape rhythm. Two ventricular pacing complexes seen.  Echocardiogram 12/19/2020: 1. Left ventricular ejection fraction, by estimation, is 55 to 60%. The  left ventricle has normal function. The left ventricle demonstrates  regional wall motion abnormalities (see scoring diagram/findings for  description). Left ventricular diastolic  parameters are indeterminate. There is mild hypokinesis of the left  ventricular, basal inferolateral wall.  2. Right ventricular systolic function is moderately reduced. The right  ventricular size is moderately enlarged. Estimated PASP 33 mmHg.  3. Right atrial size was mildly dilated.  4. Tricuspid valve regurgitation is severe.  5. The inferior vena cava is normal in size with <50% respiratory  variability, suggesting right atrial pressure of 8 mmHg.  6. Comapared to previous study report in 2018, LV/RV wall motion  abnormalities are new.   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  Inferior STEMI: Primarily RV infarct with moderate RV dilatation and dysfunction.  High-sensitivity troponin >  27,000 Currently chest pain-free.  Hemodynamically stable. Continue aspirin, heparin. Continue to hold Brilinta pending decision for PPM placement. Holding statin due to liver enzyme elevation.  Holding beta-blocker, ACE due to complete heart block and AKI. We will consider stress test or coronary angiography several days from now for risk stratification, after complete renal recovery.  RV failure: Hemodynamically stable. Off dobutamine this morning. Liver enzymes coming down Conservative management.  Do not think she needs inotropes at this time. Given her RV dilatation, I would not add any volume at this time.  Complete AV block: Ischemic in etiology. Sinus rhythm with intermittent 2:1 conduction this morning I have left the pacemaker in place with rate of 60 bpm for now.  Will continue to follow if her AV node would recover. Hold off PPM for today.  Appreciate EP input.  AKI: Resolving 2200 cc in 24 hrs. Cr down to 2.29. Baseline Cr 1.3. Appreciate nephrology input.  Type 1 DM:  Insulin per CCM. Electrolytes stable today.  CRITICAL CARE Performed by: Vernell Leep   Total critical care time: 30 minutes   Critical care time was exclusive of separately billable procedures and treating other patients.   Critical care was necessary to treat or prevent imminent or life-threatening deterioration.   Critical care was time spent personally by me on the following activities: development of treatment plan with patient and/or surrogate as well as nursing, discussions with consultants, evaluation of patient's response to treatment, examination of patient, obtaining history from patient or surrogate, ordering and performing treatments and interventions, ordering and review of laboratory studies, ordering and review of radiographic studies, pulse oximetry and re-evaluation of patient's condition.      Ma.nish Esther Hardy, MD Pager: [phone] Office: [phone]

## 2020-12-22 NOTE — Progress Notes (Signed)
NAME:  Colleen [name], MRN:  [id number], DOB:  1965/11/28, LOS: 3 ADMISSION DATE:  12/19/2020, CONSULTATION DATE:  12/22/20  REFERRING MD:  Vernell Leep, CHIEF COMPLAINT: Generalized weakness and loss of consciousness  History of Present Illness:  55 y.o. female with a relevant PMHx of type-1 insulin-dependent DM, HTN, CAD, CKDIII, left ankle arthrodesis 2/2 Charcot joint (2021), [facility] after p/w n/v and syncopal episode. EKG c/v AV block and ST depression, she was transferred under cardiology service for possible acute inferior wall STEMI and complete heart block requiring temporary pacemaker.  Patient is getting medical management for NSTEMI due to acute kidney injury, PCCM was consulted for management of acute encephalopathy and insulin-dependent diabetes.  3/29: Continued complaints of n/v and dyspepsia. Cr and LFTs improving. Producing urine.  3/30: Cr and LFTs continue to improve. C/f nausea as indicator of angina. Will try slow-titrated nitro drip prior to [place] in near future   Pertinent  Medical History   Past Medical History:  Diagnosis Date  . Asthma   . Complication of anesthesia    Psuedocholinerasterase deficiency per pt  . Diabetes mellitus    TYPE II  . Diabetic macular edema (HCC)    Pt gets injections in eyes with Eyelea  . Seasonal allergies     Coronary angiogram 05/08/2017: Ost 1st Diag to 1st Diag lesion, 80%stenosed. Prox LAD lesion, 40% stenosed. Ost 2nd Diag lesion, 60% stenosed.  Nuclear stress test  [03/26/2017]: 1. Patient attempted treadmill stress, terminated after 2 minutes due to dyspnea. Converted to pharmacologic stress. The resting electrocardiogram demonstrated normal sinus rhythm, normal resting conduction, no resting arrhythmias and normal rest repolarization. Stress EKG is non-diagnostic for ischemia as it a pharmacologic stress using Lexiscan. Stress symptoms included dyspnea. 2. The LV is dilated both at  rest and stress images. The LV end diastolic volume was [id number]. There is a moderate area of ischemia in the basal anterior, basal anteroseptal, mid anterior, mid anteroseptal and apical anterior myocardial wall(s). Overall left ventricular systolic function was abnormal without regional wall motion abnormalities. The left ventricular ejection fraction was calculated or visually estimated to be 41%. This is a high risk study, consider further cardiac work-up  Previous EKGs -  EKG 09/29/2020: Marked sinus bradycardia at rate of 54 bpm with first-degree AV block, poor R wave progression, probably normal variant, cannot exclude anteroseptal infarct old.  No evidence of ischemia, normal QT interval.  No significant change compared to 10/16/2018. No significant change from  EKG 06/18/2019    Significant Hospital Events: Including procedures, antibiotic start and stop dates in addition to other pertinent events    3/27 admitted to ICU  3/27 temporary pacemaker in place   Interim History / Subjective:  Patient continues to be conversant and awake this morning as compared to previous. Patient has continued complaint of nausea w/ mild emesis. Dyspepsia improved. Decreased appetite.  Describes passing flatus no bowel movement yet. Denies fevers, chills, headache, chest pain, dysuria. Some pleuritic pain but improved since being admitted. Describes intermittent back pain that she is not sure if it is "angina" versus chronic. Husband at bedside describes it as MSK. Patient states that she notices a big difference when TVPM is turned off for evaluation.   Objective   Blood pressure 113/79, pulse 60, temperature 98.8 F (37.1 C), temperature source Oral, resp. rate 13, height 5\' 3"  (1.6 m), weight 97 kg, SpO2 98 %.        Intake/Output Summary (Last  24 hours) at 12/22/2020 0826 Last data filed at 12/22/2020 0750 Gross per 24 hour  Intake 1326.8 ml  Output 2775 ml  Net -1448.2 ml   Filed Weights   12/19/20  0745 12/20/20 0455 12/22/20 0309  Weight: 93 kg 97.9 kg 97 kg    Examination: General: NAD, conversant, engaged HENT: EOMI, sclera anicteric  Lungs: CTAB in upper fields Cardiovascular: heart sounds faint, normal s1/s2, 2+ peripheral pulse, cap refill <2s Abdomen: NTND to light and deep palpation, soft, no hepatomegaly, bowel sounds active  Extremities: No rashes appreciated  Neuro: Moves extremities at least against gravity - not formally assessed GU: deferred   Labs/imaging that I havepersonally reviewed  (right click and "Reselect all SmartList Selections" daily)  CBC stable Cr improving LFTs improving BNP trend improving  ScvO2 improved  EKG continues to show bradycardia w/out pacing and ST elevations c/w RCA infarct   Resolved Hospital Problem list   Endotool d/c (c/f DKA given acute illness but beta-hydroxybutyric never elevated)  AKI Resolving  Metabolic encephalopathy   Assessment & Plan:  55 y.o. female in moderately stable condition with a relevant PMHx of type-1 insulin-dependent DM, HTN, CAD, CKD III, left ankle arthrodesis 2/2 Charcot joint (2021), requiring continued management in ICU for inferior MI c/b complete AV block and resolving AKI. Imminent catherization planned as AKI resolves.   Heart Block a/w Inferior MI  C/f patient reported n/v related to angina. Will try a nitro drip for management, while closely monitoring BPs. Urgency for RHC once Cr stabilized appreciated.  - Cath once Cr is appropriate. Suspect potential recover of AV w/ potential reperfusion  - Continue TPVM per electrophysiology  - Nitroglycerin IV titrated - initiated at 16mcg/min  - Resume high-intensity statin once LFTs normalize  - Secondary prevention: Hold BBlocker and ACE-Iblockade for now                     - Hold ticagrelor given future RHC  - Trend K and Mg while diuresing   T1DM  - Decrease Levemir and Novolog Glycemic Control set per inpatient DM manager      - Monitor CBGs      - When NPO for RHC:  Levemir 15U BID; CBGs q4h; Novolog 0-9 units q4h  AKI on CKDIII AKI likely 2/2 prerenal etiology 2/2 inferior MI exacerbated by underlying CKD. Function continues to improve w/ indication for dialysis. Good response to Lasix yesterday. Continue today.   - Cr down-trending with expectation of normalization to baseline (1.3) in next 24-48 hrs  - Lasix 80 mg once - Maintain MAP > 70 for optimal kidney perfusion  - Continue to trend renal function      - RHC when appropriate given Cr  - Nephrology signing off given AKI resolution  Shock Liver vs Congestive Hepatopathy  Likely congestive hepatopathy given bilirubin and protein trend.  - LFTs improving - CTM  - As above, resume statin once LFTs normalize   Dyspepsia - Maalox PRN - Metoclopramide PRN for refractory n/v    Best practice (right click and "Reselect all SmartList Selections" daily)  Diet:  Oral Pain/Anxiety/Delirium protocol (if indicated): No VAP protocol (if indicated): Not indicated DVT prophylaxis: Systemic AC GI prophylaxis: N/A Glucose control:  SSI Yes and Basal insulin Yes Central venous access:  N/A Arterial line:  N/A Foley:  N/A Mobility:  bed rest  PT consulted: Yes Last date of multidisciplinary goals of care discussion [Per primary team] Code Status: Full code Disposition:  ICU   Labs   CBC: Recent Labs  Lab 12/19/20 0532 12/19/20 0724 12/19/20 2302 12/19/20 2314 12/20/20 0202 12/21/20 0328 12/22/20 0318  WBC 11.7*  --   --   --  13.0* 11.4* 9.4  HGB 12.3   < > 9.5* 11.9* 10.9* 10.5* 10.5*  HCT 38.5   < > 28.0* 35.0* 33.6* 31.5* 32.4*  MCV 93.7  --   --   --  91.8 90.3 90.3  PLT 173  --   --   --  130* 107* 102*   < > = values in this interval not displayed.    Basic Metabolic Panel: Recent Labs  Lab 12/20/20 0414 12/20/20 0848 12/20/20 1511 12/20/20 2005 12/21/20 0328 12/21/20 1034 12/21/20 1152 12/21/20 1605 12/22/20 0318  NA 133* 133* 135  --  133*   --   --   --  132*  K 4.3 4.3 4.1   < > 4.4 4.2 4.3 3.9 3.8  CL 102 102 104  --  101  --   --   --  101  CO2 14* 20* 21*  --  20*  --   --   --  23  GLUCOSE 242* 208* 85  --  156*  --   --   --  96  BUN 91* 90* 91*  --  90*  --   --   --  83*  CREATININE 5.23* 4.93* 4.44*  --  3.47*  --   --   --  2.29*  CALCIUM 7.9* 8.8* 8.6*  --  8.2*  --   --   --  7.9*  PHOS 7.1*  --   --   --  4.5  --   --   --  3.1   < > = values in this interval not displayed.   GFR: Estimated Creatinine Clearance: 30.4 mL/min (A) (by C-G formula based on SCr of 2.29 mg/dL (H)). Recent Labs  Lab 12/19/20 0532 12/20/20 0202 12/21/20 0328 12/22/20 0318  WBC 11.7* 13.0* 11.4* 9.4    Liver Function Tests: Recent Labs  Lab 12/19/20 0532 12/19/20 1825 12/20/20 0414 12/20/20 0415 12/21/20 0328 12/22/20 0318  AST 3,285* 5,434*  --  4,893* 2,521* 1,235*  ALT 1,814* 2,839*  --  2,931* 2,551* 1,831*  ALKPHOS 139* 124  --  114 125 122  BILITOT 2.4* 2.5*  --  2.0* 1.8* 1.6*  PROT 7.0 6.0*  --  5.6* 5.4* 5.3*  ALBUMIN 4.0 3.5 3.4* 3.4* 3.3* 3.1*   Recent Labs  Lab 12/19/20 0532  LIPASE 20   No results for input(s): AMMONIA in the last 168 hours.  ABG    Component Value Date/Time   PHART 7.306 (L) 12/19/2020 2302   PCO2ART 22.4 (L) 12/19/2020 2302   PO2ART 119 (H) 12/19/2020 2302   HCO3 11.2 (L) 12/19/2020 2302   TCO2 15 (L) 12/19/2020 2314   ACIDBASEDEF 14.0 (H) 12/19/2020 2302   O2SAT 65.9 12/21/2020 1402     Coagulation Profile: Recent Labs  Lab 12/19/20 0739 12/21/20 1012  INR 2.3* 1.9*    Cardiac Enzymes: No results for input(s): CKTOTAL, CKMB, CKMBINDEX, TROPONINI in the last 168 hours.  HbA1C: Hgb A1c MFr Bld  Date/Time Value Ref Range Status  12/19/2020 11:54 AM 7.8 (H) 4.8 - 5.6 % Final    Comment:    (NOTE) Pre diabetes:          5.7%-6.4%  Diabetes:              >  6.4%  Glycemic control for   <7.0% adults with diabetes     CBG: Recent Labs  Lab 12/21/20 2030  12/21/20 2132 12/21/20 2344 12/22/20 0321 12/22/20 0708  GLUCAP 71 111* 109* 94 70    Review of Systems:     Past Medical History:  She,  has a past medical history of Asthma, Complication of anesthesia, Diabetes mellitus, Diabetic macular edema ([place]), and Seasonal allergies.   Surgical History:   Past Surgical History:  Procedure Laterality Date  . BACK SURGERY  2009  . CATARACT SURGERY     BILATERAL  . CESAREAN SECTION     X2  . KNEE SURGERY Right   . LEFT HEART CATH AND CORONARY ANGIOGRAPHY N/A 05/08/2017   Procedure: Left Heart Cath and Coronary Angiography;  Surgeon: Nigel Mormon, MD;  Location: [facility];  Service: Cardiovascular;  Laterality: N/A;  . ORIF ANKLE FRACTURE Left 03/23/2016   Procedure: OPEN REDUCTION INTERNAL FIXATION (ORIF) ANKLE FRACTURE MALLEOLUS  MALUNION WITH TIBIAL AND FIBULAR OSTEOTOMY AND AUTOGRAFT;  Surgeon: Wylene Simmer, MD;  Location: [facility];  Service: Orthopedics;  Laterality: Left;  . SHOULDER ARTHROSCOPY W/ ROTATOR CUFF REPAIR Left   . TEMPORARY PACEMAKER N/A 12/19/2020   Procedure: TEMPORARY PACEMAKER;  Surgeon: Nigel Mormon, MD;  Location: [facility];  Service: Cardiovascular;  Laterality: N/A;  . TEMPORARY PACEMAKER N/A 12/19/2020   Procedure: TEMPORARY PACEMAKER;  Surgeon: Nigel Mormon, MD;  Location: [facility];  Service: Cardiovascular;  Laterality: N/A;     Social History:   reports that she quit smoking about 33 years ago. Her smoking use included cigarettes. She has a 0.50 pack-year smoking history. She has never used smokeless tobacco. She reports current alcohol use. She reports that she does not use drugs.   Family History:  Her family history includes Hypertension in her father, paternal aunt, paternal grandfather, and paternal grandmother.   Allergies Allergies  Allergen Reactions  . Other Other (See Comments)  . Succinylcholine Other (See Comments)     Pseudocholinesterase deficiency     Home Medications  Prior to Admission medications   Medication Sig Start Date End Date Taking? Authorizing Provider  albuterol (PROVENTIL HFA;VENTOLIN HFA) 108 (90 Base) MCG/ACT inhaler Inhale 1-2 puffs into the lungs every 6 (six) hours as needed for wheezing or shortness of breath.    [provider]  amLODipine (NORVASC) 5 MG tablet Take 1 tablet (5 mg total) by mouth every evening. 09/29/20 12/28/20  Adrian Prows, MD  Cholecalciferol (VITAMIN D) 2000 units tablet Take 2,000 Units by mouth daily.    [provider]  ferrous sulfate 325 (65 FE) MG tablet Take 325 mg by mouth daily with breakfast.    [provider]  furosemide (LASIX) 40 MG tablet Take 40 mg by mouth as needed for edema.  03/14/17   [provider]  insulin lispro (HUMALOG) 100 UNIT/ML injection Inject into the skin See admin instructions. Insulin Pump - Mini Medtronic 670G    [provider]  lisinopril (ZESTRIL) 5 MG tablet Take 1 tablet by mouth daily. 05/19/19   [provider]  loratadine (CLARITIN) 10 MG tablet Take 1 tablet by mouth daily as needed.    [provider]  metoprolol succinate (TOPROL-XL) 50 MG 24 hr tablet TAKE 1 TABLET BY MOUTH  DAILY 07/08/20   Adrian Prows, MD  rosuvastatin (CRESTOR) 10 MG tablet Take 10 mg by mouth at bedtime. 06/17/20  [provider]  tobramycin (TOBREX) 0.3 % ophthalmic solution Place 2 drops into both eyes as needed (prior to injections).  12/03/18   [provider]     Critical care time: 35 minutes       Domenick Bookbinder, MS4

## 2020-12-23 ENCOUNTER — Inpatient Hospital Stay (HOSPITAL_COMMUNITY): Payer: 59

## 2020-12-23 DIAGNOSIS — I4430 Unspecified atrioventricular block: Secondary | ICD-10-CM | POA: Diagnosis not present

## 2020-12-23 DIAGNOSIS — I442 Atrioventricular block, complete: Secondary | ICD-10-CM | POA: Diagnosis not present

## 2020-12-23 DIAGNOSIS — R079 Chest pain, unspecified: Secondary | ICD-10-CM

## 2020-12-23 LAB — CBC
HCT: 30.2 % — ABNORMAL LOW (ref 36.0–46.0)
Hemoglobin: 10.1 g/dL — ABNORMAL LOW (ref 12.0–15.0)
MCH: 30.4 pg (ref 26.0–34.0)
MCHC: 33.4 g/dL (ref 30.0–36.0)
MCV: 91 fL (ref 80.0–100.0)
Platelets: 105 10*3/uL — ABNORMAL LOW (ref 150–400)
RBC: 3.32 MIL/uL — ABNORMAL LOW (ref 3.87–5.11)
RDW: 13.2 % (ref 11.5–15.5)
WBC: 7.8 10*3/uL (ref 4.0–10.5)
nRBC: 0.6 % — ABNORMAL HIGH (ref 0.0–0.2)

## 2020-12-23 LAB — COMPREHENSIVE METABOLIC PANEL
ALT: 1379 U/L — ABNORMAL HIGH (ref 0–44)
AST: 628 U/L — ABNORMAL HIGH (ref 15–41)
Albumin: 2.8 g/dL — ABNORMAL LOW (ref 3.5–5.0)
Alkaline Phosphatase: 104 U/L (ref 38–126)
Anion gap: 5 (ref 5–15)
BUN: 71 mg/dL — ABNORMAL HIGH (ref 6–20)
CO2: 25 mmol/L (ref 22–32)
Calcium: 7.5 mg/dL — ABNORMAL LOW (ref 8.9–10.3)
Chloride: 101 mmol/L (ref 98–111)
Creatinine, Ser: 1.79 mg/dL — ABNORMAL HIGH (ref 0.44–1.00)
GFR, Estimated: 33 mL/min — ABNORMAL LOW (ref >60)
Glucose, Bld: 157 mg/dL — ABNORMAL HIGH (ref 70–99)
Potassium: 4.1 mmol/L (ref 3.5–5.1)
Sodium: 131 mmol/L — ABNORMAL LOW (ref 135–145)
Total Bilirubin: 2.2 mg/dL — ABNORMAL HIGH (ref 0.3–1.2)
Total Protein: 5.1 g/dL — ABNORMAL LOW (ref 6.5–8.1)

## 2020-12-23 LAB — ECHOCARDIOGRAM LIMITED
Height: 63 in
S' Lateral: 3.2 cm
Weight: 3456.81 oz

## 2020-12-23 LAB — GLUCOSE, CAPILLARY
Glucose-Capillary: 185 mg/dL — ABNORMAL HIGH (ref 70–99)
Glucose-Capillary: 264 mg/dL — ABNORMAL HIGH (ref 70–99)
Glucose-Capillary: 220 mg/dL — ABNORMAL HIGH (ref 70–99)
Glucose-Capillary: 169 mg/dL — ABNORMAL HIGH (ref 70–99)
Glucose-Capillary: 150 mg/dL — ABNORMAL HIGH (ref 70–99)

## 2020-12-23 LAB — HEPARIN LEVEL (UNFRACTIONATED): Heparin Unfractionated: 0.6 IU/mL (ref 0.30–0.70)

## 2020-12-23 MED ORDER — CEFAZOLIN SODIUM-DEXTROSE 2-4 GM/100ML-% IV SOLN
2.0000 g | INTRAVENOUS | Status: DC
  Filled 2020-12-23: qty 100

## 2020-12-23 MED ORDER — POLYETHYLENE GLYCOL 3350 17 G PO PACK
17.0000 g | PACK | Freq: Every day | ORAL | Status: AC | PRN
  Administered 2020-12-26: 17 g via ORAL
  Filled 2020-12-23: qty 1

## 2020-12-23 MED ORDER — SODIUM CHLORIDE 0.9% FLUSH
3.0000 mL | Freq: Two times a day (BID) | INTRAVENOUS | Status: DC
  Administered 2020-12-23 – 2020-12-24 (×2): 3 mL via INTRAVENOUS

## 2020-12-23 MED ORDER — SODIUM CHLORIDE 0.9 % WEIGHT BASED INFUSION
1.0000 mL/kg/h | INTRAVENOUS | Status: DC
  Administered 2020-12-24: 1 mL/kg/h via INTRAVENOUS

## 2020-12-23 MED ORDER — ASPIRIN 81 MG PO CHEW
81.0000 mg | CHEWABLE_TABLET | ORAL | Status: AC
  Administered 2020-12-24: 81 mg via ORAL
  Filled 2020-12-23: qty 1

## 2020-12-23 MED ORDER — FAMOTIDINE 40 MG/5ML PO SUSR
20.0000 mg | Freq: Two times a day (BID) | ORAL | Status: DC
  Administered 2020-12-23 – 2020-12-30 (×15): 20 mg via ORAL
  Filled 2020-12-23 (×15): qty 2.5

## 2020-12-23 MED ORDER — SODIUM CHLORIDE 0.9 % WEIGHT BASED INFUSION: 3.0000 mL/kg/h | INTRAVENOUS | Status: AC

## 2020-12-23 MED ORDER — CHLORHEXIDINE GLUCONATE 4 % EX LIQD
60.0000 mL | Freq: Once | CUTANEOUS | Status: AC
  Administered 2020-12-23: 4 via TOPICAL
  Filled 2020-12-23: qty 15

## 2020-12-23 MED ORDER — SODIUM CHLORIDE 0.9 % IV SOLN
80.0000 mg | INTRAVENOUS | Status: DC
  Filled 2020-12-23: qty 2

## 2020-12-23 MED ORDER — PANTOPRAZOLE SODIUM 40 MG PO TBEC
40.0000 mg | DELAYED_RELEASE_TABLET | Freq: Every day | ORAL | Status: DC
  Administered 2020-12-23 – 2020-12-30 (×8): 40 mg via ORAL
  Filled 2020-12-23 (×8): qty 1

## 2020-12-23 MED ORDER — SODIUM CHLORIDE 0.9 % IV SOLN: INTRAVENOUS | Status: DC

## 2020-12-23 MED ORDER — NITROGLYCERIN 0.3 MG/HR TD PT24
0.3000 mg | MEDICATED_PATCH | Freq: Every day | TRANSDERMAL | Status: DC
  Administered 2020-12-23 – 2020-12-29 (×7): 0.3 mg via TRANSDERMAL
  Filled 2020-12-23 (×8): qty 1

## 2020-12-23 MED ORDER — SENNA 8.6 MG PO TABS
1.0000 | ORAL_TABLET | Freq: Every day | ORAL | Status: AC | PRN
  Administered 2020-12-25: 8.6 mg via ORAL
  Filled 2020-12-23: qty 1

## 2020-12-23 MED ORDER — SODIUM CHLORIDE 0.9% FLUSH: 3.0000 mL | INTRAVENOUS | Status: DC | PRN

## 2020-12-23 MED ORDER — CHLORHEXIDINE GLUCONATE 4 % EX LIQD
60.0000 mL | Freq: Once | CUTANEOUS | Status: AC
  Administered 2020-12-24: 4 via TOPICAL
  Filled 2020-12-23: qty 60

## 2020-12-23 MED ORDER — PERFLUTREN LIPID MICROSPHERE
1.0000 mL | INTRAVENOUS | Status: AC | PRN
  Administered 2020-12-23: 3 mL via INTRAVENOUS
  Filled 2020-12-23: qty 10

## 2020-12-23 MED ORDER — SODIUM CHLORIDE 0.9 % IV SOLN
250.0000 mL | INTRAVENOUS | Status: DC | PRN
Start: 2020-12-23 — End: 2020-12-24

## 2020-12-23 NOTE — Progress Notes (Signed)
Inpatient Diabetes Program Recommendations  AACE/ADA: New Consensus Statement on Inpatient Glycemic Control  Target Ranges:  Prepandial:   less than 140 mg/dL      Peak postprandial:   less than 180 mg/dL (1-2 hours)      Critically ill patients:  140 - 180 mg/dL   Results for Colleen [name], CONVEY (MRN [id number]) as of 12/23/2020 10:57  Ref. Range 12/22/2020 07:08 12/22/2020 08:42 12/22/2020 11:28 12/22/2020 16:03 12/22/2020 19:43 12/22/2020 21:33 12/22/2020 23:50 12/23/2020 03:42 12/23/2020 07:42  Glucose-Capillary Latest Ref Range: 70 - 99 mg/dL 70 146 (H) 168 (H) 156 (H) 164 (H) 173 (H) 136 (H) 185 (H) 264 (H)   Diabetes history:DM1 (makes NO  Outpatient Diabetes medications:Medtronic Insulin Pump 670G with Humalog insulin Current orders for Inpatient glycemic control: Levemir 18 units Q12H, Novolog 0-9 units TID with meals, Novolog 0-5 units QHS  Inpatient Diabetes Program Recommendations:    Insulin: Please consider increasing Levemir to 20 units Q12H.  Once patient is eating well, please consider ordering Novolog 4 units TID with meals for meal coverage if patient eats at least 50% of meals.  NOTE: RN reports that patient is nauseous today which patient is receiving medication for.   Thanks, Barnie Alderman, RN, MSN, CDE Diabetes Coordinator Inpatient Diabetes Program [phone] (Team Pager from 8am to 5pm)

## 2020-12-23 NOTE — Progress Notes (Addendum)
Progress Note  Patient Name: Colleen [name] Date of Encounter: 12/23/2020  [facility] Cardiologist: Dr. Einar Gip  Subjective   "Im doing OK", better each day, no CP or SOB  Inpatient Medications    Scheduled Meds:  alum & mag hydroxide-simeth  30 mL Oral Once   aspirin EC  81 mg Oral Daily   Chlorhexidine Gluconate Cloth  6 each Topical Daily   fentaNYL (SUBLIMAZE) injection  25 mcg Intravenous Once   insulin aspart  0-5 Units Subcutaneous QHS   insulin aspart  0-9 Units Subcutaneous TID WC   insulin detemir  18 Units Subcutaneous Q12H   pantoprazole  40 mg Oral Daily   sodium chloride flush  10-40 mL Intracatheter Q12H   Continuous Infusions:  heparin 1,650 Units/hr (12/23/20 0600)   nitroGLYCERIN 5 mcg/min (12/23/20 0600)   PRN Meds: acetaminophen, alum & mag hydroxide-simeth, dextrose, lip balm, metoCLOPramide, nitroGLYCERIN, ondansetron (ZOFRAN) IV, oxyCODONE, sodium chloride flush   Vital Signs    Vitals:   12/23/20 0500 12/23/20 0507 12/23/20 0600 12/23/20 0700  BP: (!) 98/54  113/61   Pulse: (!) 50  (!) 50   Resp: 13  13   Temp:    98.1 F (36.7 C)  TempSrc:    Axillary  SpO2: 94%  94%   Weight:  98 kg    Height:        Intake/Output Summary (Last 24 hours) at 12/23/2020 0918 Last data filed at 12/23/2020 0644 Gross per 24 hour  Intake 391.33 ml  Output 2055 ml  Net -1663.67 ml   Last 3 Weights 12/23/2020 12/22/2020 12/20/2020  Weight (lbs) 216 lb 0.8 oz 213 lb 13.5 oz 215 lb 13.3 oz  Weight (kg) 98 kg 97 kg 97.9 kg      Telemetry    V pacing, when Dr. Virgina Jock checked noted some 2:1 and later this AM she had some SR about 60bpm with 1:1 conduction 1st degree AVB - Personally Reviewed  ECG    2:1 AVBlock, 36bpm, !RS is narrow, 83ms - Personally Reviewed  Physical Exam   Exam is unchanged, though she looks like she feels a bit better GEN: No acute distress.   Neck: No JVD Cardiac: RRR, no murmurs, rubs, or gallops.  Respiratory: CTA  b/l GI: Soft, nontender, non-distended  MS: No edema; No deformity. Neuro:  Nonfocal  Psych: Normal affect   Labs    High Sensitivity Troponin:   Recent Labs  Lab 12/19/20 0715 12/19/20 1154  TROPONINIHS 75,829* >27,000*      Chemistry Recent Labs  Lab 12/21/20 0328 12/21/20 1034 12/21/20 1605 12/22/20 0318 12/23/20 0248  NA 133*  --   --  132* 131*  K 4.4   < > 3.9 3.8 4.1  CL 101  --   --  101 101  CO2 20*  --   --  23 25  GLUCOSE 156*  --   --  96 157*  BUN 90*  --   --  83* 71*  CREATININE 3.47*  --   --  2.29* 1.79*  CALCIUM 8.2*  --   --  7.9* 7.5*  PROT 5.4*  --   --  5.3* 5.1*  ALBUMIN 3.3*  --   --  3.1* 2.8*  AST 2,521*  --   --  1,235* 628*  ALT 2,551*  --   --  1,831* 1,379*  ALKPHOS 125  --   --  122 104  BILITOT 1.8*  --   --  1.6* 2.2*  GFRNONAA 15*  --   --  24* 33*  ANIONGAP 12  --   --  8 5   < > = values in this interval not displayed.     Hematology Recent Labs  Lab 12/21/20 0328 12/22/20 0318 12/23/20 0248  WBC 11.4* 9.4 7.8  RBC 3.49* 3.59* 3.32*  HGB 10.5* 10.5* 10.1*  HCT 31.5* 32.4* 30.2*  MCV 90.3 90.3 91.0  MCH 30.1 29.2 30.4  MCHC 33.3 32.4 33.4  RDW 13.2 13.2 13.2  PLT 107* 102* 105*    BNP Recent Labs  Lab 12/21/20 1012  BNP 1,323.7*     DDimer No results for input(s): DDIMER in the last 168 hours.   Radiology    DG Chest 1 View  Result Date: 12/20/2020 CLINICAL DATA:  Transvenous pacemaker placement EXAM: CHEST  1 VIEW COMPARISON:  09/25/2011 FINDINGS: Inferiorly approaching trans venous pacemaker lead is seen overlying the expected right ventricle. Lung volumes are small, but are symmetric and are clear. Right internal jugular central venous catheter tip overlies the expected innominate vein. No pneumothorax or pleural effusion. Cardiac size is within normal limits. Pulmonary vascularity is normal. No acute bone abnormality. IMPRESSION: Inferiorly approaching trans venous pacemaker lead overlies the expected  right ventricle. Electronically Signed   By: Fidela Salisbury MD   On: 12/20/2020 00:03     Cardiac Studies   12/19/20; TTE IMPRESSIONS   1. Left ventricular ejection fraction, by estimation, is 55 to 60%. The  left ventricle has normal function. The left ventricle demonstrates  regional wall motion abnormalities (see scoring diagram/findings for  description). Left ventricular diastolic  parameters are indeterminate. There is mild hypokinesis of the left  ventricular, basal inferolateral wall.   2. Right ventricular systolic function is moderately reduced. The right  ventricular size is moderately enlarged. Estimated PASP 33 mmHg.   3. Right atrial size was mildly dilated.   4. Tricuspid valve regurgitation is severe.   5. The inferior vena cava is normal in size with <50% respiratory  variability, suggesting right atrial pressure of 8 mmHg.   6. Comapared to previous study report in 2018, LV/RV wall motion  abnormalities are new.   Patient Profile     55 y.o. female with a hx of Type I DM   Admitted about 3 days of N/V, had a syncopal event the day of her admission, she was found w/inferior STEMI and CHB Planned for emergent cath and temp wire though her creat came back 4.3 and bedside echo noted presevred LVEF and LHC was deferred and temp pacing wire was placed. She did have RV dysfunction and suspected that her infarct likely started Friday with her symptoms. Trops >75000  Assessment & Plan    1. CHB (no underlying escape)     In setting of an acute inferior STEMI/MI     RV failure     AKI - creat continues to trend     Abn LFTs -still trending down           This AM she had  2:1 block with pacing held, 30's Yesterday morning with some 1:1 conduction  Pt had some intermittent nausea, he suspect sis the LFTs/hepatitis from her MI ? If angina, nitrate added yesterday deferred to attending team Dr. Curt Bears has seen and examined the patient, discussed with Dr. Virgina Jock  and CCM, plan for temp-perm PPM tomorrow after her LHC to allow for mobilization  For questions or updates, please contact [facility] Please consult [email or web address] for contact info under   Signed, Baldwin Jamaica, PA-C  12/23/2020, 9:18 AM      I have seen and examined this patient with Tommye Standard.  Agree with above, note added to reflect my findings.  On exam, RRR, no mumurs. Temporary wire remains in place with a times 1:1 conduction, though only for a few seconds. If she remains in complete AC block, Hetal Proano potentially place a temp/perm pacing system to allow her to amulate and possibly implant next week.    Nickalos Petersen M. Jaysa Kise MD 12/23/2020 4:54 PM

## 2020-12-23 NOTE — Care Management (Signed)
12-23-20 Case Manager received Medicare A insurance card from the patient. Case Manager made a copy of the card and submitted the insurance card to admitting to place on file in [place]. Case Manager will continue to follow for additional transition of care needs. Bethena Roys, RN,BSN Case Manager

## 2020-12-23 NOTE — Progress Notes (Signed)
Subjective: Feels well No chest pain Had back pain yesterday, resolved with oxycodone (she reportedly also takes it at home)  Blood pressure stable without dobutamine Urine 0ut put 2600 cc in 24 hrs Urine more clear   Temporary pacemaker in place    Objective:  Vital Signs in the last 24 hours: Temp:  [98 F (36.7 C)-98.7 F (37.1 C)] 98.1 F (36.7 C) (03/31 0700) Pulse Rate:  [50-60] 50 (03/31 0600) Resp:  [12-18] 13 (03/31 0600) BP: (88-147)/(53-81) 113/61 (03/31 0600) SpO2:  [94 %-99 %] 94 % (03/31 0600) Arterial Line BP: (108-163)/(51-68) 119/59 (03/31 0600) Weight:  [98 kg] 98 kg (03/31 0507)  Intake/Output from previous day: 03/30 0701 - 03/31 0700 In: 668.6 [I.V.:668.6] Out: 2605 [Urine:2605]  Physical Exam Vitals and nursing note reviewed.  Constitutional:      General: She is not in acute distress.    Appearance: She is well-developed. She is not ill-appearing.  HENT:     Head: Normocephalic and atraumatic.  Eyes:     Conjunctiva/sclera: Conjunctivae normal.     Pupils: Pupils are equal, round, and reactive to light.  Neck:     Vascular: No JVD.  Cardiovascular:     Rate and Rhythm: Regular rhythm. Bradycardia present.     Pulses: Normal pulses and intact distal pulses.     Heart sounds: No murmur heard.     Comments: Temporary pacemaker in place through Rt CFV Pulmonary:     Effort: Pulmonary effort is normal.     Breath sounds: Normal breath sounds. No wheezing or rales.  Abdominal:     General: Bowel sounds are normal.     Palpations: Abdomen is soft.     Tenderness: There is no rebound.  Musculoskeletal:        General: No tenderness. Normal range of motion.     Right lower leg: No edema.     Left lower leg: No edema.  Lymphadenopathy:     Cervical: No cervical adenopathy.  Skin:    General: Skin is warm and dry.  Neurological:     Mental Status: She is alert and oriented to person, place, and time.     Cranial Nerves: No cranial nerve  deficit.     Comments: Alert, awake, oriented x3.  However, has periods of somnolence.      Lab Results: BMP Recent Labs    12/21/20 0328 12/21/20 1034 12/21/20 1605 12/22/20 0318 12/23/20 0248  NA 133*  --   --  132* 131*  K 4.4   < > 3.9 3.8 4.1  CL 101  --   --  101 101  CO2 20*  --   --  23 25  GLUCOSE 156*  --   --  96 157*  BUN 90*  --   --  83* 71*  CREATININE 3.47*  --   --  2.29* 1.79*  CALCIUM 8.2*  --   --  7.9* 7.5*  GFRNONAA 15*  --   --  24* 33*   < > = values in this interval not displayed.    CBC Recent Labs  Lab 12/23/20 0248  WBC 7.8  RBC 3.32*  HGB 10.1*  HCT 30.2*  PLT 105*  MCV 91.0  MCH 30.4  MCHC 33.4  RDW 13.2    HEMOGLOBIN A1C Lab Results  Component Value Date   HGBA1C 7.8 (H) 12/19/2020   MPG 177.16 12/19/2020    Cardiac Panel (last 3 results) Results for Carano, Namiyah  A (MRN [id number]) as of 12/20/2020 11:29  Ref. Range 12/19/2020 07:15 12/19/2020 11:54  Troponin I (High Sensitivity) Latest Ref Range: <18 ng/L 75,829 (HH) >27,000 (HH)   BNP (last 3 results) N/A  TSH N/A  Lipid Panel     Component Value Date/Time   CHOL 120 12/20/2020 0415   CHOL 164 10/08/2019 0901   TRIG 96 12/20/2020 0415   HDL 59 12/20/2020 0415   HDL 99 10/08/2019 0901   CHOLHDL 2.0 12/20/2020 0415   VLDL 19 12/20/2020 0415   LDLCALC 42 12/20/2020 0415   LDLCALC 54 10/08/2019 0901   LDLDIRECT 61 10/08/2019 0901     Hepatic Function Panel Recent Labs    12/20/20 0415 12/21/20 0328 12/22/20 0318 12/23/20 0248  PROT 5.6* 5.4* 5.3* 5.1*  ALBUMIN 3.4* 3.3* 3.1* 2.8*  AST 4,893* 2,521* 1,235* 628*  ALT 2,931* 2,551* 1,831* 1,379*  ALKPHOS 114 125 122 104  BILITOT 2.0* 1.8* 1.6* 2.2*  BILIDIR 0.5*  --   --   --   IBILI 1.5*  --   --   --     Imaging: Chest x-ray 12/12/2020: Trachea is midline. Heart is enlarged, stable. Thoracic aorta is calcified. Right IJ central line tip is in the brachiocephalic vein junction region. Transvenous  pacemaker is seen from an IVC approach with tip projecting over the right ventricle. Defect related pads are in place.  Lungs are somewhat low in volume with minimal bibasilar subsegmental atelectasis. No pleural fluid.  IMPRESSION: Low lung volumes with bibasilar subsegmental atelectasis.  CT head 12/19/2020: Normal appearance of the brain.  Cardiac Studies:  EKG 12/21/2020: Sinus rhythm with 2:1 AV block Ventricular rate 42 bpm Inferior infarct, likely recent  EKG 12/20/2020: Sinus rhythm with complete AV block.   Asystole without ventricular escape rhythm. Two ventricular pacing complexes seen.  Echocardiogram 12/19/2020: 1. Left ventricular ejection fraction, by estimation, is 55 to 60%. The  left ventricle has normal function. The left ventricle demonstrates  regional wall motion abnormalities (see scoring diagram/findings for  description). Left ventricular diastolic  parameters are indeterminate. There is mild hypokinesis of the left  ventricular, basal inferolateral wall.  2. Right ventricular systolic function is moderately reduced. The right  ventricular size is moderately enlarged. Estimated PASP 33 mmHg.  3. Right atrial size was mildly dilated.  4. Tricuspid valve regurgitation is severe.  5. The inferior vena cava is normal in size with <50% respiratory  variability, suggesting right atrial pressure of 8 mmHg.  6. Comapared to previous study report in 2018, LV/RV wall motion  abnormalities are new.   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  Inferior STEMI: Primarily RV infarct with moderate RV dilatation and dysfunction.  High-sensitivity troponin > 27,000 Currently chest pain-free.  Hemodynamically stable. Continue aspirin, heparin. Continue to hold Brilinta pending decision for PPM  placement. Holding statin due to liver enzyme elevation.  Holding beta-blocker, ACE due to complete heart block and AKI. Given significant renal function improvement, will proceed with left and right heart cath on 4/1 at 10:30 AM  RV failure: Hemodynamically stable.  Liver enzymes coming down Will get limited echocardiogram to re-evaluate RV systolic function  Complete AV block: Ischemic in etiology. Sinus rhythm with intermittent 2:1 and even 1:1 conduction I have left the pacemaker in place with rate of 50 bpm for now.  Will continue to  follow if her AV node would recover. EP planning for temp-perm pacemaker for 4.1 Appreciate EP input.  AKI: Resolving 2600 cc in 24 hrs. Cr down to 1.79. Baseline Cr 1.3. Appreciate nephrology input.  Type 1 DM:  Insulin Electrolytes stable today.  CRITICAL CARE Performed by: Vernell Leep   Total critical care time: 30 minutes   Critical care time was exclusive of separately billable procedures and treating other patients.   Critical care was necessary to treat or prevent imminent or life-threatening deterioration.   Critical care was time spent personally by me on the following activities: development of treatment plan with patient and/or surrogate as well as nursing, discussions with consultants, evaluation of patient's response to treatment, examination of patient, obtaining history from patient or surrogate, ordering and performing treatments and interventions, ordering and review of laboratory studies, ordering and review of radiographic studies, pulse oximetry and re-evaluation of patient's condition.      Ma.nish Esther Hardy, MD Pager: [phone] Office: [phone]

## 2020-12-23 NOTE — H&P (View-Only) (Signed)
Subjective: Feels well No chest pain Had back pain yesterday, resolved with oxycodone (she reportedly also takes it at home)  Blood pressure stable without dobutamine Urine 0ut put 2600 cc in 24 hrs Urine more clear   Temporary pacemaker in place    Objective:  Vital Signs in the last 24 hours: Temp:  [98 F (36.7 C)-98.7 F (37.1 C)] 98.1 F (36.7 C) (03/31 0700) Pulse Rate:  [50-60] 50 (03/31 0600) Resp:  [12-18] 13 (03/31 0600) BP: (88-147)/(53-81) 113/61 (03/31 0600) SpO2:  [94 %-99 %] 94 % (03/31 0600) Arterial Line BP: (108-163)/(51-68) 119/59 (03/31 0600) Weight:  [98 kg] 98 kg (03/31 0507)  Intake/Output from previous day: 03/30 0701 - 03/31 0700 In: 668.6 [I.V.:668.6] Out: 2605 [Urine:2605]  Physical Exam Vitals and nursing note reviewed.  Constitutional:      General: She is not in acute distress.    Appearance: She is well-developed. She is not ill-appearing.  HENT:     Head: Normocephalic and atraumatic.  Eyes:     Conjunctiva/sclera: Conjunctivae normal.     Pupils: Pupils are equal, round, and reactive to light.  Neck:     Vascular: No JVD.  Cardiovascular:     Rate and Rhythm: Regular rhythm. Bradycardia present.     Pulses: Normal pulses and intact distal pulses.     Heart sounds: No murmur heard.     Comments: Temporary pacemaker in place through Rt CFV Pulmonary:     Effort: Pulmonary effort is normal.     Breath sounds: Normal breath sounds. No wheezing or rales.  Abdominal:     General: Bowel sounds are normal.     Palpations: Abdomen is soft.     Tenderness: There is no rebound.  Musculoskeletal:        General: No tenderness. Normal range of motion.     Right lower leg: No edema.     Left lower leg: No edema.  Lymphadenopathy:     Cervical: No cervical adenopathy.  Skin:    General: Skin is warm and dry.  Neurological:     Mental Status: She is alert and oriented to person, place, and time.     Cranial Nerves: No cranial nerve  deficit.     Comments: Alert, awake, oriented x3.  However, has periods of somnolence.      Lab Results: BMP Recent Labs    12/21/20 0328 12/21/20 1034 12/21/20 1605 12/22/20 0318 12/23/20 0248  NA 133*  --   --  132* 131*  K 4.4   < > 3.9 3.8 4.1  CL 101  --   --  101 101  CO2 20*  --   --  23 25  GLUCOSE 156*  --   --  96 157*  BUN 90*  --   --  83* 71*  CREATININE 3.47*  --   --  2.29* 1.79*  CALCIUM 8.2*  --   --  7.9* 7.5*  GFRNONAA 15*  --   --  24* 33*   < > = values in this interval not displayed.    CBC Recent Labs  Lab 12/23/20 0248  WBC 7.8  RBC 3.32*  HGB 10.1*  HCT 30.2*  PLT 105*  MCV 91.0  MCH 30.4  MCHC 33.4  RDW 13.2    HEMOGLOBIN A1C Lab Results  Component Value Date   HGBA1C 7.8 (H) 12/19/2020   MPG 177.16 12/19/2020    Cardiac Panel (last 3 results) Results for Vanderveer, Antonela  A (MRN [id number]) as of 12/20/2020 11:29  Ref. Range 12/19/2020 07:15 12/19/2020 11:54  Troponin I (High Sensitivity) Latest Ref Range: <18 ng/L 75,829 (HH) >27,000 (HH)   BNP (last 3 results) N/A  TSH N/A  Lipid Panel     Component Value Date/Time   CHOL 120 12/20/2020 0415   CHOL 164 10/08/2019 0901   TRIG 96 12/20/2020 0415   HDL 59 12/20/2020 0415   HDL 99 10/08/2019 0901   CHOLHDL 2.0 12/20/2020 0415   VLDL 19 12/20/2020 0415   LDLCALC 42 12/20/2020 0415   LDLCALC 54 10/08/2019 0901   LDLDIRECT 61 10/08/2019 0901     Hepatic Function Panel Recent Labs    12/20/20 0415 12/21/20 0328 12/22/20 0318 12/23/20 0248  PROT 5.6* 5.4* 5.3* 5.1*  ALBUMIN 3.4* 3.3* 3.1* 2.8*  AST 4,893* 2,521* 1,235* 628*  ALT 2,931* 2,551* 1,831* 1,379*  ALKPHOS 114 125 122 104  BILITOT 2.0* 1.8* 1.6* 2.2*  BILIDIR 0.5*  --   --   --   IBILI 1.5*  --   --   --     Imaging: Chest x-ray 12/12/2020: Trachea is midline. Heart is enlarged, stable. Thoracic aorta is calcified. Right IJ central line tip is in the brachiocephalic vein junction region. Transvenous  pacemaker is seen from an IVC approach with tip projecting over the right ventricle. Defect related pads are in place.  Lungs are somewhat low in volume with minimal bibasilar subsegmental atelectasis. No pleural fluid.  IMPRESSION: Low lung volumes with bibasilar subsegmental atelectasis.  CT head 12/19/2020: Normal appearance of the brain.  Cardiac Studies:  EKG 12/21/2020: Sinus rhythm with 2:1 AV block Ventricular rate 42 bpm Inferior infarct, likely recent  EKG 12/20/2020: Sinus rhythm with complete AV block.   Asystole without ventricular escape rhythm. Two ventricular pacing complexes seen.  Echocardiogram 12/19/2020: 1. Left ventricular ejection fraction, by estimation, is 55 to 60%. The  left ventricle has normal function. The left ventricle demonstrates  regional wall motion abnormalities (see scoring diagram/findings for  description). Left ventricular diastolic  parameters are indeterminate. There is mild hypokinesis of the left  ventricular, basal inferolateral wall.  2. Right ventricular systolic function is moderately reduced. The right  ventricular size is moderately enlarged. Estimated PASP 33 mmHg.  3. Right atrial size was mildly dilated.  4. Tricuspid valve regurgitation is severe.  5. The inferior vena cava is normal in size with <50% respiratory  variability, suggesting right atrial pressure of 8 mmHg.  6. Comapared to previous study report in 2018, LV/RV wall motion  abnormalities are new.   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  Inferior STEMI: Primarily RV infarct with moderate RV dilatation and dysfunction.  High-sensitivity troponin > 27,000 Currently chest pain-free.  Hemodynamically stable. Continue aspirin, heparin. Continue to hold Brilinta pending decision for PPM  placement. Holding statin due to liver enzyme elevation.  Holding beta-blocker, ACE due to complete heart block and AKI. Given significant renal function improvement, will proceed with left and right heart cath on 4/1 at 10:30 AM  RV failure: Hemodynamically stable.  Liver enzymes coming down Will get limited echocardiogram to re-evaluate RV systolic function  Complete AV block: Ischemic in etiology. Sinus rhythm with intermittent 2:1 and even 1:1 conduction I have left the pacemaker in place with rate of 50 bpm for now.  Will continue to  follow if her AV node would recover. EP planning for temp-perm pacemaker for 4.1 Appreciate EP input.  AKI: Resolving 2600 cc in 24 hrs. Cr down to 1.79. Baseline Cr 1.3. Appreciate nephrology input.  Type 1 DM:  Insulin Electrolytes stable today.  CRITICAL CARE Performed by: Vernell Leep   Total critical care time: 30 minutes   Critical care time was exclusive of separately billable procedures and treating other patients.   Critical care was necessary to treat or prevent imminent or life-threatening deterioration.   Critical care was time spent personally by me on the following activities: development of treatment plan with patient and/or surrogate as well as nursing, discussions with consultants, evaluation of patient's response to treatment, examination of patient, obtaining history from patient or surrogate, ordering and performing treatments and interventions, ordering and review of laboratory studies, ordering and review of radiographic studies, pulse oximetry and re-evaluation of patient's condition.      Ma.nish Esther Hardy, MD Pager: [phone] Office: [phone]

## 2020-12-23 NOTE — Progress Notes (Signed)
NAME:  Colleen [name], MRN:  [id number], DOB:  06/14/1966, LOS: 4 ADMISSION DATE:  12/19/2020, CONSULTATION DATE:  12/23/20  REFERRING MD:  Vernell Leep, CHIEF COMPLAINT: Generalized weakness and loss of consciousness  History of Present Illness:  55 y.o. female with a relevant PMHx of type-1 insulin-dependent DM, HTN, CAD, CKDIII, left ankle arthrodesis 2/2 Charcot joint (2021), transferred from [facility] after p/w n/v and syncopal episode. EKG c/v AV block and ST depression, she was transferred under cardiology service for possible acute inferior wall STEMI and complete heart block requiring temporary pacemaker.  Patient is getting medical management for NSTEMI due to acute kidney injury, PCCM was consulted for management of acute encephalopathy and insulin-dependent diabetes.  3/29: Continued complaints of n/v and dyspepsia. Cr and LFTs improving. Producing urine.  3/30: Cr and LFTs continue to improve. C/f nausea as indicator of angina. Will try slow-titrated nitro drip prior to [place] in near future  3/31: Cr and LFTs continue to improve. Planned RHC tomorrow   Pertinent  Medical History   Past Medical History:  Diagnosis Date  . Asthma   . Complication of anesthesia    Psuedocholinerasterase deficiency per pt  . Diabetes mellitus    TYPE II  . Diabetic macular edema (HCC)    Pt gets injections in eyes with Eyelea  . Seasonal allergies     Coronary angiogram 05/08/2017: Ost 1st Diag to 1st Diag lesion, 80%stenosed. Prox LAD lesion, 40% stenosed. Ost 2nd Diag lesion, 60% stenosed.  Nuclear stress test  [03/26/2017]: 1. Patient attempted treadmill stress, terminated after 2 minutes due to dyspnea. Converted to pharmacologic stress. The resting electrocardiogram demonstrated normal sinus rhythm, normal resting conduction, no resting arrhythmias and normal rest repolarization. Stress EKG is non-diagnostic for ischemia as it a pharmacologic stress using Lexiscan.  Stress symptoms included dyspnea. 2. The LV is dilated both at rest and stress images. The LV end diastolic volume was 616WV. There is a moderate area of ischemia in the basal anterior, basal anteroseptal, mid anterior, mid anteroseptal and apical anterior myocardial wall(s). Overall left ventricular systolic function was abnormal without regional wall motion abnormalities. The left ventricular ejection fraction was calculated or visually estimated to be 41%. This is a high risk study, consider further cardiac work-up  Previous EKGs -  EKG 09/29/2020: Marked sinus bradycardia at rate of 54 bpm with first-degree AV block, poor R wave progression, probably normal variant, cannot exclude anteroseptal infarct old.  No evidence of ischemia, normal QT interval.  No significant change compared to 10/16/2018. No significant change from  EKG 06/18/2019    Significant Hospital Events: Including procedures, antibiotic start and stop dates in addition to other pertinent events    3/27 admitted to ICU  3/27 temporary pacemaker in place   Interim History / Subjective:  Patient continues to be conversant and awake this mornings. States that she continues to have pleuritic chest pain on left side, pointing towards to 3-4 cm below nipple and states it is "inside". Describes worsening dyspepsia. Nausea has improved with NG drip. Discussed plan for RHC with patient. Husband at bedside.  Objective   Blood pressure 109/62, pulse (!) 50, temperature 98.1 F (36.7 C), temperature source Axillary, resp. rate 13, height 5\' 3"  (1.6 m), weight 98 kg, SpO2 95 %.        Intake/Output Summary (Last 24 hours) at 12/23/2020 0936 Last data filed at 12/23/2020 0644 Gross per 24 hour  Intake 391.33 ml  Output 2055 ml  Net -1663.67 ml   Filed Weights   12/20/20 0455 12/22/20 0309 12/23/20 0507  Weight: 97.9 kg 97 kg 98 kg    Examination: General: NAD, conversant, engaged HENT: EOMI, sclera anicteric  Lungs: CTAB in  upper fields Cardiovascular: heart sounds faint/distant w/ nml s1/s2, no friction rub appreciated Abdomen: NTND palpation, soft Extremities: No rashes appreciated  Neuro: Moves extremities at least against gravity - not formally assessed GU: deferred   Labs/imaging that I havepersonally reviewed  (right click and "Reselect all SmartList Selections" daily)  CBC stable Cr improving LFTs improving  Resolved Hospital Problem list   Endotool d/c (c/f DKA given acute illness but beta-hydroxybutyric never elevated)  AKI Resolving  Metabolic encephalopathy   Assessment & Plan:  55 y.o. female in stable condition when adequately paced with a relevant PMHx of type-1 insulin-dependent DM, HTN, CAD, CKD III, left ankle arthrodesis 2/2 Charcot joint (2021), requiring continued management in ICU for inferior MI c/b complete AV block and resolving AKI. RHC planned for tomorrow. Hope is reperfusion will help with AV nodal recover.   Heart Block a/w Inferior MI  Will continue with nitro - transitioning from drip to patch given some hypotension - as patient reports less nausea, likely her angina symptoms. RHC planned for tomorrow.  - RHC tomorrow - Continue TPVM per electrophysiology  - Nitroglycerin patch     - CTM blood pressures, d/c if hypotensive  - Resume high-intensity statin once LFTs normalize  - Secondary prevention: Hold BBlocker and ACE-Iblockade for now                     - Hold ticagrelor given future RHC  - Trend K and Mg while diuresing   Dyspepsia Worsened today. Patient attributes to prior emesis. - Maalox PRN - Famotidine BID - Protonix qd - Bowel regimen initiated   T1DM  - Blood sugars increased today following decreased Levemir and Novolog Glycemic Control      - Monitor CBGs     - Will be NPO for RHC tonight - will keep current regimen   AKI on CKDIII AKI likely 2/2 prerenal etiology 2/2 inferior MI exacerbated by underlying CKD. Function improving.  - Cr  down-trending with expectation of normalization to baseline (1.3) in next 24-48 hrs  - Maintain MAP > 70 for optimal kidney perfusion  - Continue to trend renal function  Shock Liver vs Congestive Hepatopathy  Likely congestive hepatopathy given bilirubin and protein trend.  - LFTs improving - CTM  - As above, resume statin once LFTs normalize      Best practice (right click and "Reselect all SmartList Selections" daily)  Diet:  Oral Pain/Anxiety/Delirium protocol (if indicated): No VAP protocol (if indicated): Not indicated DVT prophylaxis: Systemic AC GI prophylaxis: N/A Glucose control:  SSI Yes and Basal insulin Yes Central venous access:  N/A Arterial line:  N/A Foley:  N/A Mobility:  bed rest  PT consulted: Yes Last date of multidisciplinary goals of care discussion [Per primary team] Code Status: Full code Disposition: ICU   Labs   CBC: Recent Labs  Lab 12/19/20 0532 12/19/20 0724 12/19/20 2314 12/20/20 0202 12/21/20 0328 12/22/20 0318 12/23/20 0248  WBC 11.7*  --   --  13.0* 11.4* 9.4 7.8  HGB 12.3   < > 11.9* 10.9* 10.5* 10.5* 10.1*  HCT 38.5   < > 35.0* 33.6* 31.5* 32.4* 30.2*  MCV 93.7  --   --  91.8 90.3 90.3 91.0  PLT 173  --   --  130* 107* 102* 105*   < > = values in this interval not displayed.    Basic Metabolic Panel: Recent Labs  Lab 12/20/20 0414 12/20/20 0848 12/20/20 1511 12/20/20 2005 12/21/20 0328 12/21/20 1034 12/21/20 1152 12/21/20 1605 12/22/20 0318 12/23/20 0248  NA 133* 133* 135  --  133*  --   --   --  132* 131*  K 4.3 4.3 4.1   < > 4.4 4.2 4.3 3.9 3.8 4.1  CL 102 102 104  --  101  --   --   --  101 101  CO2 14* 20* 21*  --  20*  --   --   --  23 25  GLUCOSE 242* 208* 85  --  156*  --   --   --  96 157*  BUN 91* 90* 91*  --  90*  --   --   --  83* 71*  CREATININE 5.23* 4.93* 4.44*  --  3.47*  --   --   --  2.29* 1.79*  CALCIUM 7.9* 8.8* 8.6*  --  8.2*  --   --   --  7.9* 7.5*  PHOS 7.1*  --   --   --  4.5  --   --    --  3.1  --    < > = values in this interval not displayed.   GFR: Estimated Creatinine Clearance: 39.1 mL/min (A) (by C-G formula based on SCr of 1.79 mg/dL (H)). Recent Labs  Lab 12/20/20 0202 12/21/20 0328 12/22/20 0318 12/23/20 0248  WBC 13.0* 11.4* 9.4 7.8    Liver Function Tests: Recent Labs  Lab 12/19/20 1825 12/20/20 0414 12/20/20 0415 12/21/20 0328 12/22/20 0318 12/23/20 0248  AST 5,434*  --  4,893* 2,521* 1,235* 628*  ALT 2,839*  --  2,931* 2,551* 1,831* 1,379*  ALKPHOS 124  --  114 125 122 104  BILITOT 2.5*  --  2.0* 1.8* 1.6* 2.2*  PROT 6.0*  --  5.6* 5.4* 5.3* 5.1*  ALBUMIN 3.5 3.4* 3.4* 3.3* 3.1* 2.8*   Recent Labs  Lab 12/19/20 0532  LIPASE 20   No results for input(s): AMMONIA in the last 168 hours.  ABG    Component Value Date/Time   PHART 7.306 (L) 12/19/2020 2302   PCO2ART 22.4 (L) 12/19/2020 2302   PO2ART 119 (H) 12/19/2020 2302   HCO3 11.2 (L) 12/19/2020 2302   TCO2 15 (L) 12/19/2020 2314   ACIDBASEDEF 14.0 (H) 12/19/2020 2302   O2SAT 65.9 12/21/2020 1402     Coagulation Profile: Recent Labs  Lab 12/19/20 0739 12/21/20 1012  INR 2.3* 1.9*    Cardiac Enzymes: No results for input(s): CKTOTAL, CKMB, CKMBINDEX, TROPONINI in the last 168 hours.  HbA1C: Hgb A1c MFr Bld  Date/Time Value Ref Range Status  12/19/2020 11:54 AM 7.8 (H) 4.8 - 5.6 % Final    Comment:    (NOTE) Pre diabetes:          5.7%-6.4%  Diabetes:              >6.4%  Glycemic control for   <7.0% adults with diabetes     CBG: Recent Labs  Lab 12/22/20 1943 12/22/20 2133 12/22/20 2350 12/23/20 0342 12/23/20 0742  GLUCAP 164* 173* 136* 185* 264*    Review of Systems:     Past Medical History:  She,  has a past medical history of Asthma, Complication of anesthesia, Diabetes mellitus, Diabetic macular edema ([place]), and Seasonal allergies.  Surgical History:   Past Surgical History:  Procedure Laterality Date  . BACK SURGERY  2009  . CATARACT  SURGERY     BILATERAL  . CESAREAN SECTION     X2  . KNEE SURGERY Right   . LEFT HEART CATH AND CORONARY ANGIOGRAPHY N/A 05/08/2017   Procedure: Left Heart Cath and Coronary Angiography;  Surgeon: Nigel Mormon, MD;  Location: [facility];  Service: Cardiovascular;  Laterality: N/A;  . ORIF ANKLE FRACTURE Left 03/23/2016   Procedure: OPEN REDUCTION INTERNAL FIXATION (ORIF) ANKLE FRACTURE MALLEOLUS  MALUNION WITH TIBIAL AND FIBULAR OSTEOTOMY AND AUTOGRAFT;  Surgeon: Wylene Simmer, MD;  Location: [facility];  Service: Orthopedics;  Laterality: Left;  . SHOULDER ARTHROSCOPY W/ ROTATOR CUFF REPAIR Left   . TEMPORARY PACEMAKER N/A 12/19/2020   Procedure: TEMPORARY PACEMAKER;  Surgeon: Nigel Mormon, MD;  Location: [facility];  Service: Cardiovascular;  Laterality: N/A;  . TEMPORARY PACEMAKER N/A 12/19/2020   Procedure: TEMPORARY PACEMAKER;  Surgeon: Nigel Mormon, MD;  Location: [facility];  Service: Cardiovascular;  Laterality: N/A;     Social History:   reports that she quit smoking about 33 years ago. Her smoking use included cigarettes. She has a 0.50 pack-year smoking history. She has never used smokeless tobacco. She reports current alcohol use. She reports that she does not use drugs.   Family History:  Her family history includes Hypertension in her father, paternal aunt, paternal grandfather, and paternal grandmother.   Allergies Allergies  Allergen Reactions  . Other Other (See Comments)  . Succinylcholine Other (See Comments)    Pseudocholinesterase deficiency     Home Medications  Prior to Admission medications   Medication Sig Start Date End Date Taking? Authorizing Provider  albuterol (PROVENTIL HFA;VENTOLIN HFA) 108 (90 Base) MCG/ACT inhaler Inhale 1-2 puffs into the lungs every 6 (six) hours as needed for wheezing or shortness of breath.    [provider]  amLODipine (NORVASC) 5 MG tablet Take 1 tablet (5 mg  total) by mouth every evening. 09/29/20 12/28/20  Adrian Prows, MD  Cholecalciferol (VITAMIN D) 2000 units tablet Take 2,000 Units by mouth daily.    [provider]  ferrous sulfate 325 (65 FE) MG tablet Take 325 mg by mouth daily with breakfast.    [provider]  furosemide (LASIX) 40 MG tablet Take 40 mg by mouth as needed for edema.  03/14/17   [provider]  insulin lispro (HUMALOG) 100 UNIT/ML injection Inject into the skin See admin instructions. Insulin Pump - Mini Medtronic 670G    [provider]  lisinopril (ZESTRIL) 5 MG tablet Take 1 tablet by mouth daily. 05/19/19   [provider]  loratadine (CLARITIN) 10 MG tablet Take 1 tablet by mouth daily as needed.    [provider]  metoprolol succinate (TOPROL-XL) 50 MG 24 hr tablet TAKE 1 TABLET BY MOUTH  DAILY 07/08/20   Adrian Prows, MD  rosuvastatin (CRESTOR) 10 MG tablet Take 10 mg by mouth at bedtime. 06/17/20   [provider]  tobramycin (TOBREX) 0.3 % ophthalmic solution Place 2 drops into both eyes as needed (prior to injections).  12/03/18   [provider]     Critical care time: 35 minutes       Domenick Bookbinder, MS4

## 2020-12-23 NOTE — Progress Notes (Signed)
[place] for heparin Indication: chest pain/ACS  Allergies  Allergen Reactions  . Other Other (See Comments)  . Succinylcholine Other (See Comments)    Pseudocholinesterase deficiency    Patient Measurements: Height: 5\' 3"  (160 cm) Weight: 98 kg (216 lb 0.8 oz) IBW/kg (Calculated) : 52.4 Heparin Dosing Weight: 73.7 kg  Vital Signs: Temp: 98.1 F (36.7 C) (03/31 0700) Temp Source: Axillary (03/31 0700) BP: 109/62 (03/31 0900) Pulse Rate: 50 (03/31 0900)  Labs: Recent Labs    12/21/20 0328 12/21/20 1012 12/22/20 0318 12/23/20 0248  HGB 10.5*  --  10.5* 10.1*  HCT 31.5*  --  32.4* 30.2*  PLT 107*  --  102* 105*  LABPROT  --  21.3*  --   --   INR  --  1.9*  --   --   HEPARINUNFRC  --  0.40 0.70 0.60  CREATININE 3.47*  --  2.29* 1.79*    Estimated Creatinine Clearance: 39.1 mL/min (A) (by C-G formula based on SCr of 1.79 mg/dL (H)).  Assessment: 55 y.o. female with heart block and possible ACS for heparin. Suspicion for ACS, elevated troponins, but cath deferred due to elevated SCr.  Had temporary pacer placed.  AM HL therapeutic at 0.60. No s/sx bleeding, CBC stable.  Goal of Therapy:  Heparin level 0.3-0.7 units/ml Monitor platelets by anticoagulation protocol: Yes   Plan:  Continue IV heparin infusion at 1,650 units/hr Monitor daily HL, CBC, s/sx bleeding Follow plans for left + right heart cath (4/1)  Mercy Riding, PharmD PGY1 Acute Care Pharmacy Resident Please refer to [facility] for unit-specific pharmacist

## 2020-12-24 ENCOUNTER — Encounter (HOSPITAL_COMMUNITY)
Admission: EM | Disposition: A | Discharge status: Home / Self Care | Payer: Self-pay | Source: Home / Self Care | Attending: Cardiology

## 2020-12-24 ENCOUNTER — Encounter (HOSPITAL_COMMUNITY): Payer: Self-pay | Admitting: Cardiology

## 2020-12-24 DIAGNOSIS — I442 Atrioventricular block, complete: Secondary | ICD-10-CM | POA: Diagnosis not present

## 2020-12-24 DIAGNOSIS — I214 Non-ST elevation (NSTEMI) myocardial infarction: Secondary | ICD-10-CM

## 2020-12-24 DIAGNOSIS — I2111 ST elevation (STEMI) myocardial infarction involving right coronary artery: Secondary | ICD-10-CM

## 2020-12-24 DIAGNOSIS — Z950 Presence of cardiac pacemaker: Secondary | ICD-10-CM

## 2020-12-24 DIAGNOSIS — N179 Acute kidney failure, unspecified: Secondary | ICD-10-CM | POA: Diagnosis not present

## 2020-12-24 DIAGNOSIS — I4430 Unspecified atrioventricular block: Secondary | ICD-10-CM | POA: Diagnosis not present

## 2020-12-24 HISTORY — PX: RIGHT/LEFT HEART CATH AND CORONARY ANGIOGRAPHY: CATH118266

## 2020-12-24 LAB — CBC
HCT: 30.3 % — ABNORMAL LOW (ref 36.0–46.0)
Hemoglobin: 9.9 g/dL — ABNORMAL LOW (ref 12.0–15.0)
MCH: 30.3 pg (ref 26.0–34.0)
MCHC: 32.7 g/dL (ref 30.0–36.0)
MCV: 92.7 fL (ref 80.0–100.0)
Platelets: 97 10*3/uL — ABNORMAL LOW (ref 150–400)
RBC: 3.27 MIL/uL — ABNORMAL LOW (ref 3.87–5.11)
RDW: 13.6 % (ref 11.5–15.5)
WBC: 8.4 10*3/uL (ref 4.0–10.5)
nRBC: 0.5 % — ABNORMAL HIGH (ref 0.0–0.2)

## 2020-12-24 LAB — SURGICAL PCR SCREEN
MRSA, PCR: NEGATIVE
Staphylococcus aureus: NEGATIVE

## 2020-12-24 LAB — BASIC METABOLIC PANEL
Anion gap: 6 (ref 5–15)
BUN: 65 mg/dL — ABNORMAL HIGH (ref 6–20)
CO2: 24 mmol/L (ref 22–32)
Calcium: 7.5 mg/dL — ABNORMAL LOW (ref 8.9–10.3)
Chloride: 99 mmol/L (ref 98–111)
Creatinine, Ser: 1.69 mg/dL — ABNORMAL HIGH (ref 0.44–1.00)
GFR, Estimated: 35 mL/min — ABNORMAL LOW (ref >60)
Glucose, Bld: 138 mg/dL — ABNORMAL HIGH (ref 70–99)
Potassium: 4.2 mmol/L (ref 3.5–5.1)
Sodium: 129 mmol/L — ABNORMAL LOW (ref 135–145)

## 2020-12-24 LAB — GLUCOSE, CAPILLARY
Glucose-Capillary: 104 mg/dL — ABNORMAL HIGH (ref 70–99)
Glucose-Capillary: 69 mg/dL — ABNORMAL LOW (ref 70–99)
Glucose-Capillary: 50 mg/dL — ABNORMAL LOW (ref 70–99)
Glucose-Capillary: 125 mg/dL — ABNORMAL HIGH (ref 70–99)
Glucose-Capillary: 74 mg/dL (ref 70–99)
Glucose-Capillary: 131 mg/dL — ABNORMAL HIGH (ref 70–99)

## 2020-12-24 LAB — HEPARIN LEVEL (UNFRACTIONATED): Heparin Unfractionated: 0.58 IU/mL (ref 0.30–0.70)

## 2020-12-24 LAB — MAGNESIUM: Magnesium: 2.7 mg/dL — ABNORMAL HIGH (ref 1.7–2.4)

## 2020-12-24 LAB — POCT ACTIVATED CLOTTING TIME: Activated Clotting Time: 273 seconds

## 2020-12-24 SURGERY — TEMPORARY PACEMAKER
Anesthesia: LOCAL

## 2020-12-24 SURGERY — RIGHT/LEFT HEART CATH AND CORONARY ANGIOGRAPHY
Anesthesia: LOCAL

## 2020-12-24 MED ORDER — SODIUM CHLORIDE 0.9 % IV SOLN
250.0000 mL | INTRAVENOUS | Status: DC | PRN
Start: 2020-12-24 — End: 2020-12-30

## 2020-12-24 MED ORDER — HYDRALAZINE HCL 20 MG/ML IJ SOLN
10.0000 mg | INTRAMUSCULAR | Status: DC | PRN
Start: 2020-12-24 — End: 2020-12-24

## 2020-12-24 MED ORDER — MIDAZOLAM HCL 2 MG/2ML IJ SOLN
INTRAMUSCULAR | Status: DC | PRN
  Administered 2020-12-24: 1 mg via INTRAVENOUS

## 2020-12-24 MED ORDER — FENTANYL CITRATE (PF) 100 MCG/2ML IJ SOLN
INTRAMUSCULAR | Status: AC
  Filled 2020-12-24: qty 2

## 2020-12-24 MED ORDER — HEPARIN (PORCINE) IN NACL 1000-0.9 UT/500ML-% IV SOLN
INTRAVENOUS | Status: DC | PRN
  Administered 2020-12-24 (×2): 500 mL

## 2020-12-24 MED ORDER — MIDAZOLAM HCL 2 MG/2ML IJ SOLN
INTRAMUSCULAR | Status: AC
  Filled 2020-12-24: qty 2

## 2020-12-24 MED ORDER — ONDANSETRON HCL 4 MG/2ML IJ SOLN: 4.0000 mg | Freq: Four times a day (QID) | INTRAMUSCULAR | Status: DC | PRN

## 2020-12-24 MED ORDER — ALBUTEROL SULFATE HFA 108 (90 BASE) MCG/ACT IN AERS
1.0000 | INHALATION_SPRAY | Freq: Four times a day (QID) | RESPIRATORY_TRACT | Status: DC | PRN
  Administered 2020-12-24 – 2020-12-25 (×2): 2 via RESPIRATORY_TRACT
  Filled 2020-12-24: qty 6.7

## 2020-12-24 MED ORDER — LABETALOL HCL 5 MG/ML IV SOLN: 10.0000 mg | INTRAVENOUS | Status: DC | PRN

## 2020-12-24 MED ORDER — LIDOCAINE HCL (PF) 1 % IJ SOLN
INTRAMUSCULAR | Status: AC
  Filled 2020-12-24: qty 30

## 2020-12-24 MED ORDER — SODIUM CHLORIDE 0.9% FLUSH
3.0000 mL | Freq: Two times a day (BID) | INTRAVENOUS | Status: DC
  Administered 2020-12-24 – 2020-12-30 (×9): 3 mL via INTRAVENOUS

## 2020-12-24 MED ORDER — TICAGRELOR 90 MG PO TABS
90.0000 mg | ORAL_TABLET | Freq: Two times a day (BID) | ORAL | Status: DC
  Administered 2020-12-25 – 2020-12-27 (×6): 90 mg via ORAL
  Filled 2020-12-24 (×6): qty 1

## 2020-12-24 MED ORDER — HEPARIN (PORCINE) IN NACL 1000-0.9 UT/500ML-% IV SOLN
INTRAVENOUS | Status: AC
  Filled 2020-12-24: qty 1000

## 2020-12-24 MED ORDER — SODIUM CHLORIDE 0.9% FLUSH: 3.0000 mL | INTRAVENOUS | Status: DC | PRN

## 2020-12-24 MED ORDER — VERAPAMIL HCL 2.5 MG/ML IV SOLN
INTRAVENOUS | Status: AC
  Filled 2020-12-24: qty 2

## 2020-12-24 MED ORDER — SODIUM CHLORIDE 0.9% FLUSH
3.0000 mL | Freq: Two times a day (BID) | INTRAVENOUS | Status: DC
  Administered 2020-12-24 – 2020-12-30 (×12): 3 mL via INTRAVENOUS

## 2020-12-24 MED ORDER — LABETALOL HCL 5 MG/ML IV SOLN: 10.0000 mg | INTRAVENOUS | Status: AC | PRN

## 2020-12-24 MED ORDER — HEPARIN SODIUM (PORCINE) 1000 UNIT/ML IJ SOLN
INTRAMUSCULAR | Status: DC | PRN
  Administered 2020-12-24 (×2): 5000 [IU] via INTRAVENOUS

## 2020-12-24 MED ORDER — SODIUM CHLORIDE 0.9 % IV SOLN: 250.0000 mL | INTRAVENOUS | Status: DC | PRN

## 2020-12-24 MED ORDER — SODIUM CHLORIDE 0.9 % IV SOLN: INTRAVENOUS | Status: AC

## 2020-12-24 MED ORDER — LIDOCAINE HCL (PF) 1 % IJ SOLN
INTRAMUSCULAR | Status: DC | PRN
  Administered 2020-12-24: 3 mL
  Administered 2020-12-24: 8 mL

## 2020-12-24 MED ORDER — VERAPAMIL HCL 2.5 MG/ML IV SOLN
INTRAVENOUS | Status: DC | PRN
  Administered 2020-12-24: 10 mL via INTRA_ARTERIAL

## 2020-12-24 MED ORDER — SODIUM CHLORIDE 0.9 % IV SOLN
INTRAVENOUS | Status: AC | PRN
  Administered 2020-12-24: 250 mL via INTRAVENOUS

## 2020-12-24 MED ORDER — FENTANYL CITRATE (PF) 100 MCG/2ML IJ SOLN
INTRAMUSCULAR | Status: DC | PRN
  Administered 2020-12-24: 25 ug via INTRAVENOUS

## 2020-12-24 MED ORDER — ACETAMINOPHEN 325 MG PO TABS
650.0000 mg | ORAL_TABLET | ORAL | Status: DC | PRN
  Filled 2020-12-24: qty 2

## 2020-12-24 MED ORDER — HYDRALAZINE HCL 20 MG/ML IJ SOLN: 10.0000 mg | INTRAMUSCULAR | Status: AC | PRN

## 2020-12-24 MED ORDER — IOHEXOL 350 MG/ML SOLN
INTRAVENOUS | Status: DC | PRN
  Administered 2020-12-24: 28 mL

## 2020-12-24 MED ORDER — HEPARIN SODIUM (PORCINE) 1000 UNIT/ML IJ SOLN
INTRAMUSCULAR | Status: AC
  Filled 2020-12-24: qty 1

## 2020-12-24 MED ORDER — TICAGRELOR 90 MG PO TABS
180.0000 mg | ORAL_TABLET | Freq: Once | ORAL | Status: AC
  Administered 2020-12-24: 180 mg via ORAL
  Filled 2020-12-24: qty 2

## 2020-12-24 MED ORDER — ACETAMINOPHEN 325 MG PO TABS: 650.0000 mg | ORAL_TABLET | ORAL | Status: DC | PRN

## 2020-12-24 SURGICAL SUPPLY — 18 items
CATH 5FR JL3.5 JR4 ANG PIG MP (CATHETERS) ×1 IMPLANT
CATH INFINITI 5 FR AR1 MOD (CATHETERS) ×1 IMPLANT
CATH INFINITI 5FR AL1 (CATHETERS) ×1 IMPLANT
CATH LAUNCHER 6FR EBU3.5 (CATHETERS) ×1 IMPLANT
CATH SWAN GANZ 7F STRAIGHT (CATHETERS) ×1 IMPLANT
DEVICE RAD COMP TR BAND LRG (VASCULAR PRODUCTS) ×1 IMPLANT
GLIDESHEATH SLEND A-KIT 6F 22G (SHEATH) ×1 IMPLANT
GUIDEWIRE INQWIRE 1.5J.035X260 (WIRE) IMPLANT
INQWIRE 1.5J .035X260CM (WIRE) ×2
KIT ENCORE 26 ADVANTAGE (KITS) ×1 IMPLANT
KIT HEART LEFT (KITS) ×2 IMPLANT
KIT HEMO VALVE WATCHDOG (MISCELLANEOUS) ×1 IMPLANT
PACK CARDIAC CATHETERIZATION (CUSTOM PROCEDURE TRAY) ×2 IMPLANT
SHEATH PINNACLE 7F 10CM (SHEATH) ×1 IMPLANT
TRANSDUCER W/STOPCOCK (MISCELLANEOUS) ×2 IMPLANT
TUBING CIL FLEX 10 FLL-RA (TUBING) ×2 IMPLANT
WIRE ASAHI PROWATER 180CM (WIRE) ×1 IMPLANT
WIRE COUGAR XT STRL 190CM (WIRE) ×1 IMPLANT

## 2020-12-24 NOTE — Plan of Care (Signed)
  Problem: Education: Goal: Knowledge of General Education information will improve Description: Including pain rating scale, medication(s)/side effects and non-pharmacologic comfort measures Outcome: Progressing   Problem: Clinical Measurements: Goal: Will remain free from infection Outcome: Progressing   

## 2020-12-24 NOTE — Progress Notes (Addendum)
Progress Note  Patient Name: Colleen [name] Date of Encounter: 12/24/2020  [facility] Cardiologist: Dr. Einar Gip  Subjective   "still here!", a little better each day, no CP or SOB  Inpatient Medications    Scheduled Meds:  aspirin EC  81 mg Oral Daily   chlorhexidine  60 mL Topical Once   Chlorhexidine Gluconate Cloth  6 each Topical Daily   famotidine  20 mg Oral BID   fentaNYL (SUBLIMAZE) injection  25 mcg Intravenous Once   gentamicin irrigation  80 mg Irrigation On Call   insulin aspart  0-5 Units Subcutaneous QHS   insulin aspart  0-9 Units Subcutaneous TID WC   insulin detemir  18 Units Subcutaneous Q12H   nitroGLYCERIN  0.3 mg Transdermal Daily   pantoprazole  40 mg Oral Daily   sodium chloride flush  10-40 mL Intracatheter Q12H   sodium chloride flush  3 mL Intravenous Q12H   Continuous Infusions:  sodium chloride     sodium chloride     sodium chloride 1 mL/kg/hr (12/24/20 0625)    ceFAZolin (ANCEF) IV     heparin 1,650 Units/hr (12/24/20 0656)   PRN Meds: sodium chloride, acetaminophen, albuterol, alum & mag hydroxide-simeth, dextrose, lip balm, metoCLOPramide, nitroGLYCERIN, ondansetron (ZOFRAN) IV, oxyCODONE, polyethylene glycol, senna, sodium chloride flush, sodium chloride flush   Vital Signs    Vitals:   12/24/20 0500 12/24/20 0526 12/24/20 0600 12/24/20 0700  BP: 109/61  103/67   Pulse: 60  60   Resp: 11  12   Temp:    97.9 F (36.6 C)  TempSrc:      SpO2: 97%  98%   Weight:  101 kg    Height:        Intake/Output Summary (Last 24 hours) at 12/24/2020 0814 Last data filed at 12/24/2020 0700 Gross per 24 hour  Intake 874.53 ml  Output 830 ml  Net 44.53 ml   Last 3 Weights 12/24/2020 12/23/2020 12/22/2020  Weight (lbs) 222 lb 10.6 oz 216 lb 0.8 oz 213 lb 13.5 oz  Weight (kg) 101 kg 98 kg 97 kg      Telemetry    VPacing with pacing turned down she has 1:1 conduction w/Mobitz one, with some paced beats. - Personally Reviewed  ECG    SB  56bpm, Mobitz one, QRS 63ms- Personally Reviewed  Physical Exam   Exam is unchanged, though she again looks like she feels a bit better GEN: No acute distress.   Neck: No JVD Cardiac: RRR, no murmurs, rubs, or gallops.  Respiratory: CTA b/l GI: Soft, nontender, non-distended  MS: No edema; No deformity. Neuro:  Nonfocal  Psych: Normal affect   Labs    High Sensitivity Troponin:   Recent Labs  Lab 12/19/20 0715 12/19/20 1154  TROPONINIHS 75,829* >27,000*      Chemistry Recent Labs  Lab 12/21/20 0328 12/21/20 1034 12/22/20 0318 12/23/20 0248 12/24/20 0421  NA 133*  --  132* 131* 129*  K 4.4   < > 3.8 4.1 4.2  CL 101  --  101 101 99  CO2 20*  --  23 25 24   GLUCOSE 156*  --  96 157* 138*  BUN 90*  --  83* 71* 65*  CREATININE 3.47*  --  2.29* 1.79* 1.69*  CALCIUM 8.2*  --  7.9* 7.5* 7.5*  PROT 5.4*  --  5.3* 5.1*  --   ALBUMIN 3.3*  --  3.1* 2.8*  --   AST 2,521*  --  1,235* 628*  --   ALT 2,551*  --  1,831* 1,379*  --   ALKPHOS 125  --  122 104  --   BILITOT 1.8*  --  1.6* 2.2*  --   GFRNONAA 15*  --  24* 33* 35*  ANIONGAP 12  --  8 5 6    < > = values in this interval not displayed.     Hematology Recent Labs  Lab 12/22/20 0318 12/23/20 0248 12/24/20 0421  WBC 9.4 7.8 8.4  RBC 3.59* 3.32* 3.27*  HGB 10.5* 10.1* 9.9*  HCT 32.4* 30.2* 30.3*  MCV 90.3 91.0 92.7  MCH 29.2 30.4 30.3  MCHC 32.4 33.4 32.7  RDW 13.2 13.2 13.6  PLT 102* 105* 97*    BNP Recent Labs  Lab 12/21/20 1012  BNP 1,323.7*     DDimer No results for input(s): DDIMER in the last 168 hours.   Radiology    DG Chest 1 View  Result Date: 12/20/2020 CLINICAL DATA:  Transvenous pacemaker placement EXAM: CHEST  1 VIEW COMPARISON:  09/25/2011 FINDINGS: Inferiorly approaching trans venous pacemaker lead is seen overlying the expected right ventricle. Lung volumes are small, but are symmetric and are clear. Right internal jugular central venous catheter tip overlies the expected  innominate vein. No pneumothorax or pleural effusion. Cardiac size is within normal limits. Pulmonary vascularity is normal. No acute bone abnormality. IMPRESSION: Inferiorly approaching trans venous pacemaker lead overlies the expected right ventricle. Electronically Signed   By: Fidela Salisbury MD   On: 12/20/2020 00:03     Cardiac Studies   12/19/20; TTE IMPRESSIONS   1. Left ventricular ejection fraction, by estimation, is 55 to 60%. The  left ventricle has normal function. The left ventricle demonstrates  regional wall motion abnormalities (see scoring diagram/findings for  description). Left ventricular diastolic  parameters are indeterminate. There is mild hypokinesis of the left  ventricular, basal inferolateral wall.   2. Right ventricular systolic function is moderately reduced. The right  ventricular size is moderately enlarged. Estimated PASP 33 mmHg.   3. Right atrial size was mildly dilated.   4. Tricuspid valve regurgitation is severe.   5. The inferior vena cava is normal in size with <50% respiratory  variability, suggesting right atrial pressure of 8 mmHg.   6. Comapared to previous study report in 2018, LV/RV wall motion  abnormalities are new.   Patient Profile     55 y.o. female with a hx of Type I DM   Admitted about 3 days of N/V, had a syncopal event the day of her admission, she was found w/inferior STEMI and CHB Planned for emergent cath and temp wire though her creat came back 4.3 and bedside echo noted presevred LVEF and LHC was deferred and temp pacing wire was placed. She did have RV dysfunction and suspected that her infarct likely started Friday with her symptoms. Trops >75000  Assessment & Plan    1. CHB (no underlying escape)     In setting of an acute inferior STEMI/MI     RV failure     AKI - creat continues to trend     Abn LFTs -none today          He conduction has shown more improvement EKG this morning is SR w/Mobitz one block Dr.  Curt Bears has seen the patient, d/ew Dr. Virgina Jock and husband Given she has had clear improvement in her conduction Leilany Digeronimo hold off temp pacer today and hopefully can remove temp  wire from groin in the next 24 hours and she can mobilize Temp pacer sensitivity was increased and pacing rate turned down to 40  Planned for LHC today   For questions or updates, please contact [place] Please consult [email or web address] for contact info under   Signed, Baldwin Jamaica, PA-C  12/24/2020, 8:14 AM      I have seen and examined this patient with Tommye Standard.  Agree with above, note added to reflect my findings.  On exam, RRR, no murmurs.   Patient continues to feel well.  Fortunately, her conduction system has improved and she has now an AV Wenckebach.  I have turned her temporary pacing wire down to 40 bpm.  She is sensing appropriately.  If conduction remains stable, would potentially plan to pull the pacer wire tomorrow.  Hopefully we can avoid pacemaker implant.  EP to see as needed through the weekend.  Claude Waldman M. Emiliya Chretien MD 12/24/2020 9:49 AM

## 2020-12-24 NOTE — Progress Notes (Signed)
eLink Physician-Brief Progress Note Patient Name: Colleen [name] DOB: July 07, 1966 MRN: [id number]   Date of Service  12/24/2020  HPI/Events of Note  Wheezing - Patient requests home Albuterol MDI.  eICU Interventions  Plan: 1. Albuterol MDI 1-2 puffs Q 6 hours PRN wheezing or SOB.      Intervention Category Major Interventions: Other:  Lysle Dingwall 12/24/2020, 12:45 AM

## 2020-12-24 NOTE — Progress Notes (Signed)
NAME:  Colleen [name], MRN:  [id number], DOB:  August 10, 1966, LOS: 5 ADMISSION DATE:  12/19/2020, CONSULTATION DATE:  12/24/20  REFERRING MD:  Vernell Leep, CHIEF COMPLAINT: Generalized weakness and loss of consciousness  History of Present Illness:  55 y.o. female with a relevant PMHx of type-1 insulin-dependent DM, HTN, CAD, CKDIII, left ankle arthrodesis 2/2 Charcot joint (2021), transferred from [facility] after p/w n/v and syncopal episode. EKG c/v AV block and ST depression, she was transferred under cardiology service for possible acute inferior wall STEMI and complete heart block requiring temporary pacemaker.  Patient is undergoing medical management for NSTEMI due to acute kidney injury, PCCM was consulted for management of acute encephalopathy and insulin-dependent diabetes. Planned right and left heart catherization today at 1030.   3/29: Continued complaints of n/v and dyspepsia. Cr and LFTs improving. Producing urine.  3/30: Cr and LFTs continue to improve. C/f nausea as indicator of angina. Will try slow-titrated nitro drip prior to [place] in near future  3/31: Cr and LFTs continue to improve. Planned [facility] tomorrow  4/1: L/RHC scheduled for 1030   Pertinent  Medical History   Past Medical History:  Diagnosis Date  . Asthma   . Complication of anesthesia    Psuedocholinerasterase deficiency per pt  . Diabetes mellitus    TYPE II  . Diabetic macular edema (HCC)    Pt gets injections in eyes with Eyelea  . Seasonal allergies     Coronary angiogram 05/08/2017: Ost 1st Diag to 1st Diag lesion, 80%stenosed. Prox LAD lesion, 40% stenosed. Ost 2nd Diag lesion, 60% stenosed.  Nuclear stress test  [03/26/2017]: 1. Patient attempted treadmill stress, terminated after 2 minutes due to dyspnea. Converted to pharmacologic stress. The resting electrocardiogram demonstrated normal sinus rhythm, normal resting conduction, no resting arrhythmias and normal rest  repolarization. Stress EKG is non-diagnostic for ischemia as it a pharmacologic stress using Lexiscan. Stress symptoms included dyspnea. 2. The LV is dilated both at rest and stress images. The LV end diastolic volume was 948NI. There is a moderate area of ischemia in the basal anterior, basal anteroseptal, mid anterior, mid anteroseptal and apical anterior myocardial wall(s). Overall left ventricular systolic function was abnormal without regional wall motion abnormalities. The left ventricular ejection fraction was calculated or visually estimated to be 41%. This is a high risk study, consider further cardiac work-up  Previous EKGs -  EKG 09/29/2020: Marked sinus bradycardia at rate of 54 bpm with first-degree AV block, poor R wave progression, probably normal variant, cannot exclude anteroseptal infarct old.  No evidence of ischemia, normal QT interval.  No significant change compared to 10/16/2018. No significant change from  EKG 06/18/2019    Significant Hospital Events: Including procedures, antibiotic start and stop dates in addition to other pertinent events    3/27 admitted to ICU  3/27 temporary pacemaker in place   Interim History / Subjective:  Patient interactive and conversant this morning. Reports continued pleuritic chest pain, which was previously identified on left side, pointing towards to 3-4 cm below nipple and stated it was "inside". Dyspepsia improved this morning. Nausea has continued to be improved with NG patch. Discussed plan for [facility] with patient and husband. Nurse at bedside with no new concerns.  Objective   Blood pressure 105/69, pulse (!) 59, temperature 97.9 F (36.6 C), resp. rate 13, height 5\' 3"  (1.6 m), weight 101 kg, SpO2 98 %.        Intake/Output Summary (Last 24 hours) at 12/24/2020  [place] filed at 12/24/2020 0700 Gross per 24 hour  Intake 688.35 ml  Output 785 ml  Net -96.65 ml   Filed Weights   12/22/20 0309 12/23/20 0507 12/24/20 0526   Weight: 97 kg 98 kg 101 kg    Examination: General: NAD, conversant, engaged HENT: EOMI, sclera anicteric  Lungs: CTAB in upper fields Cardiovascular: heart sounds faint/distant w/ nml s1/s2, no friction rub appreciated Abdomen: NTND, soft Extremities: No rashes appreciated  Neuro: Moves extremities at least against gravity - not formally assessed GU: deferred   Labs/imaging that I havepersonally reviewed  (right click and "Reselect all SmartList Selections" daily)  CBC stable Cr improving LFTs improved yesterday, retrend tomorrow   Resolved Hospital Problem list   Endotool d/c (c/f DKA given acute illness but beta-hydroxybutyric never elevated)  AKI Resolving  Metabolic encephalopathy   Assessment & Plan:  55 y.o. female in stable condition when adequately paced with a relevant PMHx of type-1 insulin-dependent DM, HTN, CAD, CKD III, left ankle arthrodesis 2/2 Charcot joint (2021), requiring continued management in ICU for inferior MI c/b complete AV block and resolving AKI. L/RHC planned for today. AV node shows sign of spontaneous recover. Will determine further management plan following catherization which will clarify extent of myocardial damage. Will determine plan to diuresis following procedure and potential for contrast induced renal injury.   Heart Block a/w Inferior MI  Will continue to monitor nausea, likely her angina symptoms and determine if Nitro patch is still indicated follow cath. L/RHC planned for today at 65.  - Continue TPVM per electrophysiology  - Considering resuming high-intensity statin over weekend once LFTs normalize  - Secondary prevention: Hold BBlocker and ACE-Iblockade for now                     - Hold ticagrelor given future RHC  - Trend K and Mg while diuresing   T1DM  - Blood sugars increased today following decreased Levemir and Novolog Glycemic Control      - Monitor CBGs - labile overnight      - Hypoglycemic following NPO     -  Following procedure, revisit basal versus bolus insulin requirements given diet    AKI on CKDIII AKI likely 2/2 prerenal etiology 2/2 inferior MI exacerbated by underlying CKD. Cr down-trending but expect to see slight kidney injury following L/RHC and contrast. Patient receiving NS prior to procedure. As above, will use Cr to determine diuresis plan for today. - Maintain MAP > 70 for optimal kidney perfusion  - Continue to trend renal function  Shock Liver vs Congestive Hepatopathy  Likely congestive hepatopathy given bilirubin and protein trend.  - LFTs improving - will retrend tomorrow  - CTM  - As above, resume statin once LFTs normalize    Dyspepsia Improved today. Patient attributes to prior emesis. - Maalox PRN - Famotidine BID - Protonix QD - Continue bowel regimen     Best practice (right click and "Reselect all SmartList Selections" daily)  Diet:  Oral Pain/Anxiety/Delirium protocol (if indicated): No VAP protocol (if indicated): Not indicated DVT prophylaxis: Systemic AC GI prophylaxis: N/A Glucose control:  SSI Yes and Basal insulin Yes Central venous access:  N/A Arterial line:  N/A Foley:  N/A Mobility:  bed rest  PT consulted: Yes Last date of multidisciplinary goals of care discussion [Per primary team] Code Status: Full code Disposition: ICU   Labs   CBC: Recent Labs  Lab 12/20/20 0202 12/21/20 0328 12/22/20 4259  12/23/20 0248 12/24/20 0421  WBC 13.0* 11.4* 9.4 7.8 8.4  HGB 10.9* 10.5* 10.5* 10.1* 9.9*  HCT 33.6* 31.5* 32.4* 30.2* 30.3*  MCV 91.8 90.3 90.3 91.0 92.7  PLT 130* 107* 102* 105* 97*    Basic Metabolic Panel: Recent Labs  Lab 12/20/20 0414 12/20/20 0848 12/20/20 1511 12/20/20 2005 12/21/20 0328 12/21/20 1034 12/21/20 1152 12/21/20 1605 12/22/20 0318 12/23/20 0248 12/24/20 0421  NA 133*   < > 135  --  133*  --   --   --  132* 131* 129*  K 4.3   < > 4.1   < > 4.4   < > 4.3 3.9 3.8 4.1 4.2  CL 102   < > 104  --  101  --    --   --  101 101 99  CO2 14*   < > 21*  --  20*  --   --   --  23 25 24   GLUCOSE 242*   < > 85  --  156*  --   --   --  96 157* 138*  BUN 91*   < > 91*  --  90*  --   --   --  83* 71* 65*  CREATININE 5.23*   < > 4.44*  --  3.47*  --   --   --  2.29* 1.79* 1.69*  CALCIUM 7.9*   < > 8.6*  --  8.2*  --   --   --  7.9* 7.5* 7.5*  MG  --   --   --   --   --   --   --   --   --   --  2.7*  PHOS 7.1*  --   --   --  4.5  --   --   --  3.1  --   --    < > = values in this interval not displayed.   GFR: Estimated Creatinine Clearance: 42.1 mL/min (A) (by C-G formula based on SCr of 1.69 mg/dL (H)). Recent Labs  Lab 12/21/20 0328 12/22/20 0318 12/23/20 0248 12/24/20 0421  WBC 11.4* 9.4 7.8 8.4    Liver Function Tests: Recent Labs  Lab 12/19/20 1825 12/20/20 0414 12/20/20 0415 12/21/20 0328 12/22/20 0318 12/23/20 0248  AST 5,434*  --  4,893* 2,521* 1,235* 628*  ALT 2,839*  --  2,931* 2,551* 1,831* 1,379*  ALKPHOS 124  --  114 125 122 104  BILITOT 2.5*  --  2.0* 1.8* 1.6* 2.2*  PROT 6.0*  --  5.6* 5.4* 5.3* 5.1*  ALBUMIN 3.5 3.4* 3.4* 3.3* 3.1* 2.8*   Recent Labs  Lab 12/19/20 0532  LIPASE 20   No results for input(s): AMMONIA in the last 168 hours.  ABG    Component Value Date/Time   PHART 7.306 (L) 12/19/2020 2302   PCO2ART 22.4 (L) 12/19/2020 2302   PO2ART 119 (H) 12/19/2020 2302   HCO3 11.2 (L) 12/19/2020 2302   TCO2 15 (L) 12/19/2020 2314   ACIDBASEDEF 14.0 (H) 12/19/2020 2302   O2SAT 65.9 12/21/2020 1402     Coagulation Profile: Recent Labs  Lab 12/19/20 0739 12/21/20 1012  INR 2.3* 1.9*    Cardiac Enzymes: No results for input(s): CKTOTAL, CKMB, CKMBINDEX, TROPONINI in the last 168 hours.  HbA1C: Hgb A1c MFr Bld  Date/Time Value Ref Range Status  12/19/2020 11:54 AM 7.8 (H) 4.8 - 5.6 % Final    Comment:    (NOTE) Pre diabetes:  5.7%-6.4%  Diabetes:              >6.4%  Glycemic control for   <7.0% adults with diabetes      CBG: Recent Labs  Lab 12/23/20 1114 12/23/20 1545 12/23/20 2208 12/24/20 0659 12/24/20 0728  GLUCAP 220* 169* 150* 69* 104*    Review of Systems:     Past Medical History:  She,  has a past medical history of Asthma, Complication of anesthesia, Diabetes mellitus, Diabetic macular edema ([place]), and Seasonal allergies.   Surgical History:   Past Surgical History:  Procedure Laterality Date  . BACK SURGERY  2009  . CATARACT SURGERY     BILATERAL  . CESAREAN SECTION     X2  . KNEE SURGERY Right   . LEFT HEART CATH AND CORONARY ANGIOGRAPHY N/A 05/08/2017   Procedure: Left Heart Cath and Coronary Angiography;  Surgeon: Nigel Mormon, MD;  Location: [place] [facility];  Service: Cardiovascular;  Laterality: N/A;  . ORIF ANKLE FRACTURE Left 03/23/2016   Procedure: OPEN REDUCTION INTERNAL FIXATION (ORIF) ANKLE FRACTURE MALLEOLUS  MALUNION WITH TIBIAL AND FIBULAR OSTEOTOMY AND AUTOGRAFT;  Surgeon: Wylene Simmer, MD;  Location: [place];  Service: Orthopedics;  Laterality: Left;  . SHOULDER ARTHROSCOPY W/ ROTATOR CUFF REPAIR Left   . TEMPORARY PACEMAKER N/A 12/19/2020   Procedure: TEMPORARY PACEMAKER;  Surgeon: Nigel Mormon, MD;  Location: [place] [facility];  Service: Cardiovascular;  Laterality: N/A;  . TEMPORARY PACEMAKER N/A 12/19/2020   Procedure: TEMPORARY PACEMAKER;  Surgeon: Nigel Mormon, MD;  Location: [place] [facility];  Service: Cardiovascular;  Laterality: N/A;     Social History:   reports that she quit smoking about 33 years ago. Her smoking use included cigarettes. She has a 0.50 pack-year smoking history. She has never used smokeless tobacco. She reports current alcohol use. She reports that she does not use drugs.   Family History:  Her family history includes Hypertension in her father, paternal aunt, paternal grandfather, and paternal grandmother.   Allergies Allergies  Allergen Reactions  . Other Other (See Comments)  .  Succinylcholine Other (See Comments)    Pseudocholinesterase deficiency     Home Medications  Prior to Admission medications   Medication Sig Start Date End Date Taking? Authorizing Provider  albuterol (PROVENTIL HFA;VENTOLIN HFA) 108 (90 Base) MCG/ACT inhaler Inhale 1-2 puffs into the lungs every 6 (six) hours as needed for wheezing or shortness of breath.    [provider]  amLODipine (NORVASC) 5 MG tablet Take 1 tablet (5 mg total) by mouth every evening. 09/29/20 12/28/20  Adrian Prows, MD  Cholecalciferol (VITAMIN D) 2000 units tablet Take 2,000 Units by mouth daily.    [provider]  ferrous sulfate 325 (65 FE) MG tablet Take 325 mg by mouth daily with breakfast.    [provider]  furosemide (LASIX) 40 MG tablet Take 40 mg by mouth as needed for edema.  03/14/17   [provider]  insulin lispro (HUMALOG) 100 UNIT/ML injection Inject into the skin See admin instructions. Insulin Pump - Mini Medtronic 670G    [provider]  lisinopril (ZESTRIL) 5 MG tablet Take 1 tablet by mouth daily. 05/19/19   [provider]  loratadine (CLARITIN) 10 MG tablet Take 1 tablet by mouth daily as needed.    [provider]  metoprolol succinate (TOPROL-XL) 50 MG 24 hr tablet TAKE 1 TABLET BY MOUTH  DAILY 07/08/20   Adrian Prows,  MD  rosuvastatin (CRESTOR) 10 MG tablet Take 10 mg by mouth at bedtime. 06/17/20   [provider]  tobramycin (TOBREX) 0.3 % ophthalmic solution Place 2 drops into both eyes as needed (prior to injections).  12/03/18   [provider]     Critical care time: 35 minutes       Domenick Bookbinder, MS4

## 2020-12-24 NOTE — Progress Notes (Signed)
[place] for heparin Indication: chest pain/ACS  Allergies  Allergen Reactions  . Other Other (See Comments)  . Succinylcholine Other (See Comments)    Pseudocholinesterase deficiency    Patient Measurements: Height: 5\' 3"  (160 cm) Weight: 101 kg (222 lb 10.6 oz) IBW/kg (Calculated) : 52.4 Heparin Dosing Weight: 73.7 kg  Vital Signs: Temp: 97.9 F (36.6 C) (04/01 0700) Temp Source: Oral (03/31 2300) BP: 105/69 (04/01 0900) Pulse Rate: 59 (04/01 0900)  Labs: Recent Labs    12/21/20 1012 12/21/20 1012 12/22/20 0318 12/23/20 0248 12/24/20 0421  HGB  --    < > 10.5* 10.1* 9.9*  HCT  --   --  32.4* 30.2* 30.3*  PLT  --   --  102* 105* 97*  LABPROT 21.3*  --   --   --   --   INR 1.9*  --   --   --   --   HEPARINUNFRC 0.40  --  0.70 0.60 0.58  CREATININE  --   --  2.29* 1.79* 1.69*   < > = values in this interval not displayed.    Estimated Creatinine Clearance: 42.1 mL/min (A) (by C-G formula based on SCr of 1.69 mg/dL (H)).  Assessment: 55 y.o. female with heart block and possible ACS started on heparin. No AC PTA, CBC stable. Heparin level therapeutic, cath planned today.  Goal of Therapy:  Heparin level 0.3-0.7 units/ml Monitor platelets by anticoagulation protocol: Yes   Plan:  Continue IV heparin infusion at 1650 units/hr Cath today   Arrie Senate, PharmD, BCPS, [facility] Clinical Pharmacist [phone] Please check AMION for all [facility] Pharmacy numbers 12/24/2020

## 2020-12-24 NOTE — Interval H&P Note (Signed)
History and Physical Interval Note:  12/24/2020 9:51 AM  Colleen [name]  has presented today for surgery, with the diagnosis of post stemi, complete heart block.  The various methods of treatment have been discussed with the patient and family. After consideration of risks, benefits and other options for treatment, the patient has consented to  Procedure(s): RIGHT/LEFT HEART CATH AND CORONARY ANGIOGRAPHY (N/A) as a surgical intervention.  The patient's history has been reviewed, patient examined, no change in status, stable for surgery.  I have reviewed the patient's chart and labs.  Questions were answered to the patient's satisfaction.    2012 Appropriate Use Criteria for Diagnostic Catheterization Home / Select Test of Interest Indication for RHC Cardiomyopathies Cardiomyopathies (Right and Left Heart Catheterization OR Right Heart Catheterization Alone With/Wit Cardiomyopathies  (Right and Left Heart Catheterization OR  Right Heart Catheterization Alone With/Without Left Ventriculography and Coronary Angiography)  Link Here: [email or web address] Indication:  Known or suspected cardiomyopathy with or without heart failure A (7) Indication: 93; Score 7 291   [name] J Trevaris Pennella

## 2020-12-25 DIAGNOSIS — I442 Atrioventricular block, complete: Secondary | ICD-10-CM | POA: Diagnosis not present

## 2020-12-25 DIAGNOSIS — I2111 ST elevation (STEMI) myocardial infarction involving right coronary artery: Secondary | ICD-10-CM

## 2020-12-25 DIAGNOSIS — R55 Syncope and collapse: Secondary | ICD-10-CM | POA: Diagnosis not present

## 2020-12-25 DIAGNOSIS — N179 Acute kidney failure, unspecified: Secondary | ICD-10-CM | POA: Diagnosis not present

## 2020-12-25 LAB — CBC
HCT: 28.1 % — ABNORMAL LOW (ref 36.0–46.0)
Hemoglobin: 9.1 g/dL — ABNORMAL LOW (ref 12.0–15.0)
MCH: 29.8 pg (ref 26.0–34.0)
MCHC: 32.4 g/dL (ref 30.0–36.0)
MCV: 92.1 fL (ref 80.0–100.0)
Platelets: 93 10*3/uL — ABNORMAL LOW (ref 150–400)
RBC: 3.05 MIL/uL — ABNORMAL LOW (ref 3.87–5.11)
RDW: 13.8 % (ref 11.5–15.5)
WBC: 6.4 10*3/uL (ref 4.0–10.5)
nRBC: 0.3 % — ABNORMAL HIGH (ref 0.0–0.2)

## 2020-12-25 LAB — BASIC METABOLIC PANEL
Anion gap: 5 (ref 5–15)
BUN: 50 mg/dL — ABNORMAL HIGH (ref 6–20)
CO2: 24 mmol/L (ref 22–32)
Calcium: 7.5 mg/dL — ABNORMAL LOW (ref 8.9–10.3)
Chloride: 101 mmol/L (ref 98–111)
Creatinine, Ser: 1.38 mg/dL — ABNORMAL HIGH (ref 0.44–1.00)
GFR, Estimated: 45 mL/min — ABNORMAL LOW (ref >60)
Glucose, Bld: 166 mg/dL — ABNORMAL HIGH (ref 70–99)
Potassium: 4.4 mmol/L (ref 3.5–5.1)
Sodium: 130 mmol/L — ABNORMAL LOW (ref 135–145)

## 2020-12-25 LAB — GLUCOSE, CAPILLARY
Glucose-Capillary: 151 mg/dL — ABNORMAL HIGH (ref 70–99)
Glucose-Capillary: 169 mg/dL — ABNORMAL HIGH (ref 70–99)
Glucose-Capillary: 207 mg/dL — ABNORMAL HIGH (ref 70–99)
Glucose-Capillary: 208 mg/dL — ABNORMAL HIGH (ref 70–99)
Glucose-Capillary: 208 mg/dL — ABNORMAL HIGH (ref 70–99)
Glucose-Capillary: 209 mg/dL — ABNORMAL HIGH (ref 70–99)
Glucose-Capillary: 224 mg/dL — ABNORMAL HIGH (ref 70–99)
Glucose-Capillary: 277 mg/dL — ABNORMAL HIGH (ref 70–99)

## 2020-12-25 LAB — MAGNESIUM: Magnesium: 2.9 mg/dL — ABNORMAL HIGH (ref 1.7–2.4)

## 2020-12-25 MED ORDER — BISACODYL 5 MG PO TBEC
5.0000 mg | DELAYED_RELEASE_TABLET | Freq: Every day | ORAL | Status: DC | PRN
  Administered 2020-12-25: 5 mg via ORAL
  Filled 2020-12-25: qty 1

## 2020-12-25 NOTE — Progress Notes (Addendum)
Subjective:  Feels fine, has not had any further chest pain, has had occasional episodes of nausea and mild epigastric discomfort, states that it may be related to the medications.  Intake/Output from previous day:  I/O last 3 completed shifts: In: 1965 [P.O.:960; I.V.:1005] Out: 1100 [Urine:1100] No intake/output data recorded.  Blood pressure (!) 106/59, pulse 63, temperature 98.4 F (36.9 C), temperature source Oral, resp. rate 18, height _0  (1.6 m), weight 101 kg, SpO2 96 %.   Physical Exam Constitutional:      Appearance: She is obese.  Eyes:     Extraocular Movements: Extraocular movements intact.  Neck:     Vascular: No carotid bruit or JVD.  Cardiovascular:     Rate and Rhythm: Normal rate and regular rhythm.     Pulses: Intact distal pulses.     Heart sounds: Normal heart sounds. No murmur heard. No gallop.      Comments: Right groin site is stable Pulmonary:     Effort: Pulmonary effort is normal.     Breath sounds: Normal breath sounds.  Abdominal:     General: Bowel sounds are normal.     Palpations: Abdomen is soft.  Musculoskeletal:        General: No swelling.     Cervical back: Normal range of motion and neck supple.  Skin:    General: Skin is warm and dry.  Neurological:     General: No focal deficit present.     Mental Status: She is alert.     Lab Results: BMP BNP (last 3 results) Recent Labs    12/21/20 1012  BNP 1,323.7*    ProBNP (last 3 results) No results for input(s): PROBNP in the last 8760 hours. BMP Latest Ref Rng & Units 12/25/2020 12/24/2020 12/23/2020  Glucose 70 - 99 mg/dL 166(H) 138(H) 157(H)  BUN 6 - 20 mg/dL 50(H) 65(H) 71(H)  Creatinine 0.44 - 1.00 mg/dL 1.38(H) 1.69(H) 1.79(H)  Sodium 135 - 145 mmol/L 130(L) 129(L) 131(L)  Potassium 3.5 - 5.1 mmol/L 4.4 4.2 4.1  Chloride 98 - 111 mmol/L 101 99 101  CO2 22 - 32 mmol/L _1 Calcium 8.9 - 10.3 mg/dL 7.5(L) 7.5(L) 7.5(L)   Hepatic Function Latest Ref Rng & Units  12/23/2020 12/22/2020 12/21/2020  Total Protein 6.5 - 8.1 g/dL 5.1(L) 5.3(L) 5.4(L)  Albumin 3.5 - 5.0 g/dL 2.8(L) 3.1(L) 3.3(L)  AST 15 - 41 U/L 628(H) 1,235(H) 2,521(H)  ALT 0 - 44 U/L 1,379(H) 1,831(H) 2,551(H)  Alk Phosphatase 38 - 126 U/L 104 122 125  Total Bilirubin 0.3 - 1.2 mg/dL 2.2(H) 1.6(H) 1.8(H)  Bilirubin, Direct 0.0 - 0.2 mg/dL - - -   CBC Latest Ref Rng & Units 12/25/2020 12/24/2020 12/23/2020  WBC 4.0 - 10.5 K/uL 6.4 8.4 7.8  Hemoglobin 12.0 - 15.0 g/dL 9.1(L) 9.9(L) 10.1(L)  Hematocrit 36.0 - 46.0 % 28.1(L) 30.3(L) 30.2(L)  Platelets 150 - 400 K/uL 93(L) 97(L) 105(L)   Lipid Panel     Component Value Date/Time   CHOL 120 12/20/2020 0415   CHOL 164 10/08/2019 0901   TRIG 96 12/20/2020 0415   HDL 59 12/20/2020 0415   HDL 99 10/08/2019 0901   CHOLHDL 2.0 12/20/2020 0415   VLDL 19 12/20/2020 0415   LDLCALC 42 12/20/2020 0415   LDLCALC 54 10/08/2019 0901   LDLDIRECT 61 10/08/2019 0901   Cardiac Panel (last 3 results) No results for input(s): CKTOTAL, CKMB, TROPONINI, RELINDX in the last 72 hours.  HEMOGLOBIN A1C Lab  Results  Component Value Date   HGBA1C 7.8 (H) 12/19/2020   MPG 177.16 12/19/2020   TSH Recent Labs    12/21/20 1012  TSH 1.862   Imaging:  Scheduled Meds: . aspirin EC  81 mg Oral Daily  . Chlorhexidine Gluconate Cloth  6 each Topical Daily  . famotidine  20 mg Oral BID  . fentaNYL (SUBLIMAZE) injection  25 mcg Intravenous Once  . insulin aspart  0-5 Units Subcutaneous QHS  . insulin aspart  0-9 Units Subcutaneous TID WC  . insulin detemir  18 Units Subcutaneous Q12H  . nitroGLYCERIN  0.3 mg Transdermal Daily  . pantoprazole  40 mg Oral Daily  . sodium chloride flush  10-40 mL Intracatheter Q12H  . sodium chloride flush  3 mL Intravenous Q12H  . sodium chloride flush  3 mL Intravenous Q12H  . ticagrelor  90 mg Oral BID   Continuous Infusions: . sodium chloride    . sodium chloride     PRN Meds:.sodium chloride, sodium chloride,  acetaminophen, albuterol, alum & mag hydroxide-simeth, bisacodyl, dextrose, lip balm, metoCLOPramide, nitroGLYCERIN, ondansetron (ZOFRAN) IV, oxyCODONE, polyethylene glycol, senna, sodium chloride flush, sodium chloride flush, sodium chloride flush   Cardiac Studies:  EKG 12/20/2020: Sinus rhythm with complete AV block.   Asystole without ventricular escape rhythm. Two ventricular pacing complexes seen.  Echocardiogram 12/19/2020: 1. Left ventricular ejection fraction, by estimation, is 55 to 60%. The  left ventricle has normal function. The left ventricle demonstrates  regional wall motion abnormalities (see scoring diagram/findings for  description). Left ventricular diastolic  parameters are indeterminate. There is mild hypokinesis of the left  ventricular, basal inferolateral wall.  2. Right ventricular systolic function is moderately reduced. The right  ventricular size is moderately enlarged. Estimated PASP 33 mmHg.  3. Right atrial size was mildly dilated.  4. Tricuspid valve regurgitation is severe.  5. The inferior vena cava is normal in size with <50% respiratory  variability, suggesting right atrial pressure of 8 mmHg.  6. Comapared to previous study report in 2018, LV/RV wall motion  abnormalities are new.   Left Heart Catheterization 12/24/2020:   LV end diastolic pressure is normal.  LM: Normal LAD: Prox mod calcific 40% stenosis (Unchanges compared to cath 2018) LCx: Mid 100% occlusion, likely chronic. Reconstitution through retrograde filling from OM2 (New since cath 2018) RCA: Ostial 100% occlusion, likely the culprit for her STEMI with late presentation         Retrograde filling from LAD up to distal RCA (New since cath 2018) RAL 12 mmHg LVEDP: 10 mmHg I attempted wiring LCx to see if this lesion was CTO Will discuss ischemia guided revascularization in future for LCx +/- LAD.  Assessment/Plan:  1. STEMI 2. Multi vessel CAD 3. Acute renal failure resolving 4.  Complete heart block.  Recommendations: I checked the pacemaker parameters, the temporary pacemaker is working appropriately, sensing is excellent.  She appears to be in complete heart block with no recovery of the AV node as noted yesterday with 2: 1 conduction.  Suspect she probably will need permanent pacemaker at this point, will have EP evaluate her either tomorrow or Monday to see if this is necessary.  I will see if cardiac MR can be performed tomorrow to see the viability in the inferior wall.  The right coronary artery by prior cardiac catheterization is fairly large.  If there is significant amount viability, either repeat attempt at coronary angiography and possible angioplasty to the right coronary artery which  could not be well engaged or could have been occluded in the ostium need to be further evaluated.  Limited contrast was utilized in view of acute renal failure which is now improving and essentially resolved.  I placed orders for cardiac MR however I will discuss with radiology to see whether temporary pacemaker will be an issue.  In spite of reducing the heart rate to 40 bpm, blood pressure is stable and patient asymptomatic with narrow complex escape.  25 minutes of critical care in evaluation of conduction system normality, evaluation of complex cardiac anatomy and discussions regarding therapy going forward.   Adrian Prows, MD, [facility] 12/25/2020, 10:58 PM Office: [phone] Pager: [phone]

## 2020-12-25 NOTE — Progress Notes (Signed)
NAME:  Colleen [name], MRN:  [id number], DOB:  1966-06-03, LOS: 6 ADMISSION DATE:  12/19/2020, CONSULTATION DATE:  12/25/20  REFERRING MD:  Vernell Leep, CHIEF COMPLAINT: Generalized weakness and loss of consciousness  Brief History  55 y.o. female with a relevant PMHx of type-1 insulin-dependent DM, HTN, CAD, CKDIII, left ankle arthrodesis 2/2 Charcot joint (2021), transferred from [facility] after p/w n/v and syncopal episode. EKG c/v AV block and ST depression, she was transferred under cardiology service for possible acute inferior wall STEMI and complete heart block requiring temporary pacemaker.  Patient is undergoing medical management for NSTEMI due to acute kidney injury, PCCM was consulted for management of acute encephalopathy and insulin-dependent diabetes.   Pertinent  Medical History   Past Medical History:  Diagnosis Date   Asthma    Complication of anesthesia    Psuedocholinerasterase deficiency per pt   Diabetes mellitus    TYPE II   Diabetic macular edema ([place])    Pt gets injections in eyes with [facility]   Seasonal allergies      Coronary angiogram 05/08/2017: Ost 1st Diag to 1st Diag lesion, 80%stenosed. Prox LAD lesion, 40% stenosed. Ost 2nd Diag lesion, 60% stenosed.   Nuclear stress test   [03/26/2017]: 1. Patient attempted treadmill stress, terminated after 2 minutes due to dyspnea. Converted to pharmacologic stress. The resting electrocardiogram demonstrated normal sinus rhythm, normal resting conduction, no resting arrhythmias and normal rest repolarization. Stress EKG is non-diagnostic for ischemia as it a pharmacologic stress using Lexiscan. Stress symptoms included dyspnea. 2. The LV is dilated both at rest and stress images. The LV end diastolic volume was 226JF. There is a moderate area of ischemia in the basal anterior, basal anteroseptal, mid anterior, mid anteroseptal and apical anterior myocardial wall(s). Overall left ventricular systolic  function was abnormal without regional wall motion abnormalities. The left ventricular ejection fraction was calculated or visually estimated to be 41%. This is a high risk study, consider further cardiac work-up  Previous EKGs -  EKG 09/29/2020: Marked sinus bradycardia at rate of 54 bpm with first-degree AV block, poor R wave progression, probably normal variant, cannot exclude anteroseptal infarct old.  No evidence of ischemia, normal QT interval.  No significant change compared to 10/16/2018. No significant change from  EKG 06/18/2019    Significant Hospital Events: Including procedures, antibiotic start and stop dates in addition to other pertinent events   3/27 admitted to ICU 3/27 temporary pacemaker in place 3/29: Continued complaints of n/v and dyspepsia. Cr and LFTs improving. Producing urine.  3/30: Cr and LFTs continue to improve. C/f nausea as indicator of angina. Will try slow-titrated nitro drip prior to [place] in near future  3/31: Cr and LFTs continue to improve. Planned [facility] tomorrow  4/1: LHC with occlusion concerning for CTO - no stents placed.     Interim History / Subjective:  No overnight issues. Overnight HR dropped into the 40s.  EKG - shows first degree AV block this morning.   Objective   Blood pressure (!) 125/114, pulse 64, temperature 98.3 F (36.8 C), resp. rate 13, height 5\' 3"  (1.6 m), weight 101 kg, SpO2 98 %.        Intake/Output Summary (Last 24 hours) at 12/25/2020 0805 Last data filed at 12/25/2020 0600 Gross per 24 hour  Intake 1484.98 ml  Output 900 ml  Net 584.98 ml   Filed Weights   12/22/20 0309 12/23/20 0507 12/24/20 0526  Weight: 97 kg 98 kg 101  kg    Examination: General: NAD, conversant, engaged HENT: EOMI, sclera anicteric  Lungs: CTAB no wheezes or crackles Cardiovascular: RRR, no mrg, paced at 50, HR in mid 50s.  Abdomen: NTND, soft Neuro: moves all 4 extremities, normal speech  Labs/imaging that I havepersonally reviewed  (right  click and "Reselect all SmartList Selections" daily)  Na 130, K 4.4, Na 1.38, Hgb 6.4, Hgb 9.1  Resolved Hospital Problem list   Endotool d/c (c/f DKA given acute illness but beta-hydroxybutyric never elevated)  AKI Resolving  Metabolic encephalopathy  Congestive hepatopathy  Assessment & Plan:  55 y.o. woman with Type 1 DM, HTN, CKD stage 3 and CAD who presenst with Inferior MI complicated by complete heart block.   Heart Block a/w Inferior MI  Will continue to monitor nausea, likely her angina symptoms and determine if Nitro patch is still indicated follow cath. L/RHC planned for today at 1030.  - Continue TPVM per electrophysiology, set at 72 - Considering resuming high-intensity statin over weekend once LFTs normalize  - Secondary prevention: Hold BBlocker and ACE-Iblockade for now                     - Hold ticagrelor given future RHC  - Trend K and Mg while diuresing   T1DM  - improved glycemic control with increase in SSI and levemir yesterday - continue current coverage  AKI on CKDIII AKI likely 2/2 prerenal etiology 2/2 inferior MI exacerbated by underlying CKD. - Cr downtrending. Allow for autoregulation today with oral intake. No increase in IVF or lasix today. - Maintain MAP > 70 for optimal kidney perfusion  - Continue to trend renal function  Dyspepsia Suspect underlying gastroparesis - Maalox PRN - Famotidine BID - Protonix QD - Continue bowel regimen   At this point her shock is resolved,, encephalopathy and blood sugars imrpoved. EP managing TVPM. Plan for future ischemic work up +/- cath per cardiology. PCCM will see as needed but please don't hesitate to contact us if we can be of further assistance.    Best practice (right click and "Reselect all SmartList Selections" daily)  Diet:  Oral Pain/Anxiety/Delirium protocol (if indicated): No VAP protocol (if indicated): Not indicated DVT prophylaxis: Systemic AC GI prophylaxis: N/A Glucose control:  SSI Yes  and Basal insulin Yes Central venous access:  N/A Arterial line:  N/A Foley:  N/A Mobility:  bed rest  PT consulted: Yes Last date of multidisciplinary goals of care discussion [Per primary team] Code Status: Full code Disposition: ICU  The patient is critically ill due to complete heart block requiring TV pacing.  Critical care was necessary to treat or prevent imminent or life-threatening deterioration.  Critical care was time spent personally by me on the following activities: development of treatment plan with patient and/or surrogate as well as nursing, discussions with consultants, evaluation of patient's response to treatment, examination of patient, obtaining history from patient or surrogate, ordering and performing treatments and interventions, ordering and review of laboratory studies, ordering and review of radiographic studies, pulse oximetry, re-evaluation of patient's condition and participation in multidisciplinary rounds.   Critical Care Time devoted to patient care services described in this note is 32 minutes. This time reflects time of care of this [place] . This critical care time does not reflect separately billable procedures or procedure time, teaching time or supervisory time of PA/NP/Med student/Med Resident etc but could involve care discussion time.       Senaida Ores  [facility] 12/25/2020 8:05 AM  Pager: see AMION  If no response to pager , please call critical care on call (see AMION) until 7pm After 7:00 pm call Elink       Labs   CBC: Recent Labs  Lab 12/21/20 0328 12/22/20 0318 12/23/20 0248 12/24/20 0421 12/25/20 0308  WBC 11.4* 9.4 7.8 8.4 6.4  HGB 10.5* 10.5* 10.1* 9.9* 9.1*  HCT 31.5* 32.4* 30.2* 30.3* 28.1*  MCV 90.3 90.3 91.0 92.7 92.1  PLT 107* 102* 105* 97* 93*    Basic Metabolic Panel: Recent Labs  Lab 12/20/20 0414 12/20/20 0848 12/21/20 0328 12/21/20 1034 12/21/20 1605  12/22/20 0318 12/23/20 0248 12/24/20 0421 12/25/20 0308  NA 133*   < > 133*  --   --  132* 131* 129* 130*  K 4.3   < > 4.4   < > 3.9 3.8 4.1 4.2 4.4  CL 102   < > 101  --   --  101 101 99 101  CO2 14*   < > 20*  --   --  23 25 24 24   GLUCOSE 242*   < > 156*  --   --  96 157* 138* 166*  BUN 91*   < > 90*  --   --  83* 71* 65* 50*  CREATININE 5.23*   < > 3.47*  --   --  2.29* 1.79* 1.69* 1.38*  CALCIUM 7.9*   < > 8.2*  --   --  7.9* 7.5* 7.5* 7.5*  MG  --   --   --   --   --   --   --  2.7* 2.9*  PHOS 7.1*  --  4.5  --   --  3.1  --   --   --    < > = values in this interval not displayed.   GFR: Estimated Creatinine Clearance: 51.6 mL/min (A) (by C-G formula based on SCr of 1.38 mg/dL (H)). Recent Labs  Lab 12/22/20 0318 12/23/20 0248 12/24/20 0421 12/25/20 0308  WBC 9.4 7.8 8.4 6.4    Liver Function Tests: Recent Labs  Lab 12/19/20 1825 12/20/20 0414 12/20/20 0415 12/21/20 0328 12/22/20 0318 12/23/20 0248  AST 5,434*  --  4,893* 2,521* 1,235* 628*  ALT 2,839*  --  2,931* 2,551* 1,831* 1,379*  ALKPHOS 124  --  114 125 122 104  BILITOT 2.5*  --  2.0* 1.8* 1.6* 2.2*  PROT 6.0*  --  5.6* 5.4* 5.3* 5.1*  ALBUMIN 3.5 3.4* 3.4* 3.3* 3.1* 2.8*   Recent Labs  Lab 12/19/20 0532  LIPASE 20   No results for input(s): AMMONIA in the last 168 hours.  ABG    Component Value Date/Time   PHART 7.306 (L) 12/19/2020 2302   PCO2ART 22.4 (L) 12/19/2020 2302   PO2ART 119 (H) 12/19/2020 2302   HCO3 11.2 (L) 12/19/2020 2302   TCO2 15 (L) 12/19/2020 2314   ACIDBASEDEF 14.0 (H) 12/19/2020 2302   O2SAT 65.9 12/21/2020 1402     Coagulation Profile: Recent Labs  Lab 12/19/20 0739 12/21/20 1012  INR 2.3* 1.9*    Cardiac Enzymes: No results for input(s): CKTOTAL, CKMB, CKMBINDEX, TROPONINI in the last 168 hours.  HbA1C: Hgb A1c MFr Bld  Date/Time Value Ref Range Status  12/19/2020 11:54 AM 7.8 (H) 4.8 - 5.6 % Final    Comment:    (NOTE) Pre diabetes:  5.7%-6.4%  Diabetes:              >6.4%  Glycemic control for   <7.0% adults with diabetes     CBG: Recent Labs  Lab 12/24/20 1509 12/24/20 2128 12/25/20 0015 12/25/20 0359 12/25/20 0648  GLUCAP 131* 208* 208* 151* 169*

## 2020-12-26 LAB — GLUCOSE, CAPILLARY
Glucose-Capillary: 111 mg/dL — ABNORMAL HIGH (ref 70–99)
Glucose-Capillary: 117 mg/dL — ABNORMAL HIGH (ref 70–99)
Glucose-Capillary: 166 mg/dL — ABNORMAL HIGH (ref 70–99)
Glucose-Capillary: 183 mg/dL — ABNORMAL HIGH (ref 70–99)
Glucose-Capillary: 299 mg/dL — ABNORMAL HIGH (ref 70–99)
Glucose-Capillary: 89 mg/dL (ref 70–99)

## 2020-12-26 LAB — CBC
HCT: 28.7 % — ABNORMAL LOW (ref 36.0–46.0)
Hemoglobin: 9.4 g/dL — ABNORMAL LOW (ref 12.0–15.0)
MCH: 29.8 pg (ref 26.0–34.0)
MCHC: 32.8 g/dL (ref 30.0–36.0)
MCV: 91.1 fL (ref 80.0–100.0)
Platelets: 97 10*3/uL — ABNORMAL LOW (ref 150–400)
RBC: 3.15 MIL/uL — ABNORMAL LOW (ref 3.87–5.11)
RDW: 14.7 % (ref 11.5–15.5)
WBC: 6.1 10*3/uL (ref 4.0–10.5)
nRBC: 0 % (ref 0.0–0.2)

## 2020-12-26 LAB — MAGNESIUM: Magnesium: 2.9 mg/dL — ABNORMAL HIGH (ref 1.7–2.4)

## 2020-12-26 NOTE — Plan of Care (Signed)
  Problem: Education: Goal: Knowledge of General Education information will improve Description: Including pain rating scale, medication(s)/side effects and non-pharmacologic comfort measures Outcome: Progressing   Problem: Clinical Measurements: Goal: Will remain free from infection Outcome: Progressing Goal: Respiratory complications will improve Outcome: Progressing   Problem: Nutrition: Goal: Adequate nutrition will be maintained Outcome: Progressing   Problem: Elimination: Goal: Will not experience complications related to bowel motility Outcome: Not Progressing Goal: Will not experience complications related to urinary retention Outcome: Progressing   Problem: Safety: Goal: Ability to remain free from injury will improve Outcome: Progressing

## 2020-12-26 NOTE — Progress Notes (Signed)
Subjective:  She is presently feeling well, no specific symptoms.  Intake/Output from previous day:  I/O last 3 completed shifts: In: 480 [P.O.:480] Out: 1375 [Urine:1375] Total I/O In: 240 [P.O.:240] Out: 725 [Urine:725]  Blood pressure (!) 122/48, pulse 68, temperature 98.3 F (36.8 C), resp. rate 16, height '5\' 3"'  (1.6 m), weight 101 kg, SpO2 98 %.   Physical Exam Constitutional:      Appearance: She is obese.  Eyes:     Extraocular Movements: Extraocular movements intact.  Neck:     Vascular: No carotid bruit or JVD.  Cardiovascular:     Rate and Rhythm: Normal rate and regular rhythm.     Pulses: Intact distal pulses.     Heart sounds: Normal heart sounds. No murmur heard. No gallop.      Comments: Right groin site is stable Pulmonary:     Effort: Pulmonary effort is normal.     Breath sounds: Normal breath sounds.  Abdominal:     General: Bowel sounds are normal.     Palpations: Abdomen is soft.  Musculoskeletal:        General: No swelling.     Cervical back: Normal range of motion and neck supple.  Skin:    General: Skin is warm and dry.  Neurological:     General: No focal deficit present.     Mental Status: She is alert.     Lab Results: BMP BNP (last 3 results) Recent Labs    12/21/20 1012  BNP 1,323.7*    ProBNP (last 3 results) No results for input(s): PROBNP in the last 8760 hours. BMP Latest Ref Rng & Units 12/25/2020 12/24/2020 12/23/2020  Glucose 70 - 99 mg/dL 166(H) 138(H) 157(H)  BUN 6 - 20 mg/dL 50(H) 65(H) 71(H)  Creatinine 0.44 - 1.00 mg/dL 1.38(H) 1.69(H) 1.79(H)  Sodium 135 - 145 mmol/L 130(L) 129(L) 131(L)  Potassium 3.5 - 5.1 mmol/L 4.4 4.2 4.1  Chloride 98 - 111 mmol/L 101 99 101  CO2 22 - 32 mmol/L '24 24 25  ' Calcium 8.9 - 10.3 mg/dL 7.5(L) 7.5(L) 7.5(L)   Hepatic Function Latest Ref Rng & Units 12/23/2020 12/22/2020 12/21/2020  Total Protein 6.5 - 8.1 g/dL 5.1(L) 5.3(L) 5.4(L)  Albumin 3.5 - 5.0 g/dL 2.8(L) 3.1(L) 3.3(L)  AST 15  - 41 U/L 628(H) 1,235(H) 2,521(H)  ALT 0 - 44 U/L 1,379(H) 1,831(H) 2,551(H)  Alk Phosphatase 38 - 126 U/L 104 122 125  Total Bilirubin 0.3 - 1.2 mg/dL 2.2(H) 1.6(H) 1.8(H)  Bilirubin, Direct 0.0 - 0.2 mg/dL - - -   CBC Latest Ref Rng & Units 12/26/2020 12/25/2020 12/24/2020  WBC 4.0 - 10.5 K/uL 6.1 6.4 8.4  Hemoglobin 12.0 - 15.0 g/dL 9.4(L) 9.1(L) 9.9(L)  Hematocrit 36.0 - 46.0 % 28.7(L) 28.1(L) 30.3(L)  Platelets 150 - 400 K/uL 97(L) 93(L) 97(L)   Lipid Panel     Component Value Date/Time   CHOL 120 12/20/2020 0415   CHOL 164 10/08/2019 0901   TRIG 96 12/20/2020 0415   HDL 59 12/20/2020 0415   HDL 99 10/08/2019 0901   CHOLHDL 2.0 12/20/2020 0415   VLDL 19 12/20/2020 0415   LDLCALC 42 12/20/2020 0415   LDLCALC 54 10/08/2019 0901   LDLDIRECT 61 10/08/2019 0901   Cardiac Panel (last 3 results) No results for input(s): CKTOTAL, CKMB, TROPONINI, RELINDX in the last 72 hours.  HEMOGLOBIN A1C Lab Results  Component Value Date   HGBA1C 7.8 (H) 12/19/2020   MPG 177.16 12/19/2020   TSH Recent  Labs    12/21/20 1012  TSH 1.862   Imaging:  Scheduled Meds: . aspirin EC  81 mg Oral Daily  . Chlorhexidine Gluconate Cloth  6 each Topical Daily  . famotidine  20 mg Oral BID  . fentaNYL (SUBLIMAZE) injection  25 mcg Intravenous Once  . insulin aspart  0-5 Units Subcutaneous QHS  . insulin aspart  0-9 Units Subcutaneous TID WC  . insulin detemir  18 Units Subcutaneous Q12H  . nitroGLYCERIN  0.3 mg Transdermal Daily  . pantoprazole  40 mg Oral Daily  . sodium chloride flush  10-40 mL Intracatheter Q12H  . sodium chloride flush  3 mL Intravenous Q12H  . sodium chloride flush  3 mL Intravenous Q12H  . ticagrelor  90 mg Oral BID   Continuous Infusions: . sodium chloride    . sodium chloride     PRN Meds:.sodium chloride, sodium chloride, acetaminophen, albuterol, alum & mag hydroxide-simeth, bisacodyl, dextrose, lip balm, metoCLOPramide, nitroGLYCERIN, ondansetron (ZOFRAN) IV,  oxyCODONE, polyethylene glycol, senna, sodium chloride flush, sodium chloride flush, sodium chloride flush   Cardiac Studies:  EKG 12/20/2020: Sinus rhythm with complete AV block.   Asystole without ventricular escape rhythm. Two ventricular pacing complexes seen.  Echocardiogram 12/19/2020: 1. Left ventricular ejection fraction, by estimation, is 55 to 60%. The  left ventricle has normal function. The left ventricle demonstrates  regional wall motion abnormalities (see scoring diagram/findings for  description). Left ventricular diastolic  parameters are indeterminate. There is mild hypokinesis of the left  ventricular, basal inferolateral wall.  2. Right ventricular systolic function is moderately reduced. The right  ventricular size is moderately enlarged. Estimated PASP 33 mmHg.  3. Right atrial size was mildly dilated.  4. Tricuspid valve regurgitation is severe.  5. The inferior vena cava is normal in size with <50% respiratory  variability, suggesting right atrial pressure of 8 mmHg.  6. Comapared to previous study report in 2018, LV/RV wall motion  abnormalities are new.   Left Heart Catheterization 12/24/2020:   LV end diastolic pressure is normal.  LM: Normal LAD: Prox mod calcific 40% stenosis (Unchanges compared to cath 2018) LCx: Mid 100% occlusion, likely chronic. Reconstitution through retrograde filling from OM2 (New since cath 2018) RCA: Ostial 100% occlusion, likely the culprit for her STEMI with late presentation         Retrograde filling from LAD up to distal RCA (New since cath 2018) RAL 12 mmHg LVEDP: 10 mmHg I attempted wiring LCx to see if this lesion was CTO Will discuss ischemia guided revascularization in future for LCx +/- LAD.  Assessment/Plan:  1. STEMI 2. Multi vessel CAD 3. Acute renal failure resolving 4. Complete heart block.  Recommendations:  I have placed the pacemaker at VVI mode at 30 bpm, she is presently in [place] type I AV block  this morning and last evening only.  She has not had any ventricular arrhythmias.  Had a very lengthy discussion with the patient and her husband who is at the bedside and try to reassure them that our plans are not to place a pacemaker if it can be avoided.  I had a long discussion with the nurse in charge as well of the patient and to watch her rhythm closely and if she drops her blood pressure or if she is paced at 30 bpm to go back on cardiac pacemaker to 50 bpm.  Initially had planned on performing MRI to evaluate for viability but in view of stable heart rhythm not  meeting pacemaker hopefully, we will be able to perform MRI at a later date when she is healed and recuperated well to look for both viability and could perform stress MRI to evaluate for presence of ischemia in the anterior wall territory as well.  Otherwise she is on appropriate medical therapy, she is not on an ACE inhibitor or ARB due to acute renal failure, she is not on a beta-blocker due to complete heart block.  She is presently on aspirin and dual antiplatelet therapy with Brilinta, continue the same and lipids are well controlled.  Critical care time spent 40 minutes.   Adrian Prows, MD, [facility] 12/26/2020, 3:15 PM Office: [phone] Pager: [phone]

## 2020-12-27 LAB — MAGNESIUM: Magnesium: 2.6 mg/dL — ABNORMAL HIGH (ref 1.7–2.4)

## 2020-12-27 LAB — BASIC METABOLIC PANEL
Anion gap: 7 (ref 5–15)
BUN: 32 mg/dL — ABNORMAL HIGH (ref 6–20)
CO2: 23 mmol/L (ref 22–32)
Calcium: 8 mg/dL — ABNORMAL LOW (ref 8.9–10.3)
Chloride: 102 mmol/L (ref 98–111)
Creatinine, Ser: 1.48 mg/dL — ABNORMAL HIGH (ref 0.44–1.00)
GFR, Estimated: 41 mL/min — ABNORMAL LOW (ref >60)
Glucose, Bld: 202 mg/dL — ABNORMAL HIGH (ref 70–99)
Potassium: 4.6 mmol/L (ref 3.5–5.1)
Sodium: 132 mmol/L — ABNORMAL LOW (ref 135–145)

## 2020-12-27 LAB — CBC
HCT: 29.6 % — ABNORMAL LOW (ref 36.0–46.0)
Hemoglobin: 9.6 g/dL — ABNORMAL LOW (ref 12.0–15.0)
MCH: 30.5 pg (ref 26.0–34.0)
MCHC: 32.4 g/dL (ref 30.0–36.0)
MCV: 94 fL (ref 80.0–100.0)
Platelets: 92 10*3/uL — ABNORMAL LOW (ref 150–400)
RBC: 3.15 MIL/uL — ABNORMAL LOW (ref 3.87–5.11)
RDW: 15 % (ref 11.5–15.5)
WBC: 5.8 10*3/uL (ref 4.0–10.5)
nRBC: 0 % (ref 0.0–0.2)

## 2020-12-27 LAB — HEPATIC FUNCTION PANEL
ALT: 343 U/L — ABNORMAL HIGH (ref 0–44)
AST: 61 U/L — ABNORMAL HIGH (ref 15–41)
Albumin: 2.8 g/dL — ABNORMAL LOW (ref 3.5–5.0)
Alkaline Phosphatase: 152 U/L — ABNORMAL HIGH (ref 38–126)
Bilirubin, Direct: 0.5 mg/dL — ABNORMAL HIGH (ref 0.0–0.2)
Indirect Bilirubin: 1.4 mg/dL — ABNORMAL HIGH (ref 0.3–0.9)
Total Bilirubin: 1.9 mg/dL — ABNORMAL HIGH (ref 0.3–1.2)
Total Protein: 5.7 g/dL — ABNORMAL LOW (ref 6.5–8.1)

## 2020-12-27 LAB — GLUCOSE, CAPILLARY
Glucose-Capillary: 106 mg/dL — ABNORMAL HIGH (ref 70–99)
Glucose-Capillary: 108 mg/dL — ABNORMAL HIGH (ref 70–99)
Glucose-Capillary: 158 mg/dL — ABNORMAL HIGH (ref 70–99)
Glucose-Capillary: 212 mg/dL — ABNORMAL HIGH (ref 70–99)
Glucose-Capillary: 250 mg/dL — ABNORMAL HIGH (ref 70–99)
Glucose-Capillary: 283 mg/dL — ABNORMAL HIGH (ref 70–99)

## 2020-12-27 MED ORDER — FUROSEMIDE 10 MG/ML IJ SOLN
40.0000 mg | Freq: Every day | INTRAMUSCULAR | Status: DC
  Administered 2020-12-27 – 2020-12-29 (×3): 40 mg via INTRAVENOUS
  Filled 2020-12-27 (×3): qty 4

## 2020-12-27 NOTE — Progress Notes (Signed)
Subjective: Feels great No complaints  Mobitz type 1 AV block on monitor  Objective:  Vital Signs in the last 24 hours: Temp:  [98.2 F (36.8 C)-98.8 F (37.1 C)] 98.2 F (36.8 C) (04/04 0640) Pulse Rate:  [51-Colleen] 63 (04/04 0600) Resp:  [11-24] 14 (04/04 0600) BP: (85-157)/(48-77) 119/60 (04/04 0600) SpO2:  [96 %-100 %] 99 % (04/04 0600)  Intake/Output from previous day: 04/03 0701 - 04/04 0700 In: 720 [P.O.:720] Out: [place] [Urine:1525]  Physical Exam Vitals and nursing note reviewed.  Constitutional:      General: She is not in acute distress.    Appearance: She is well-developed. She is not ill-appearing.  HENT:     Head: Normocephalic and atraumatic.  Eyes:     Conjunctiva/sclera: Conjunctivae normal.     Pupils: Pupils are equal, round, and reactive to light.  Neck:     Vascular: No JVD.  Cardiovascular:     Rate and Rhythm: Regular rhythm. Bradycardia present.     Pulses: Normal pulses and intact distal pulses.     Heart sounds: No murmur heard.     Comments: Temporary pacemaker in place through Rt CFV Pulmonary:     Effort: Pulmonary effort is normal.     Breath sounds: Normal breath sounds. No wheezing or rales.  Abdominal:     General: Bowel sounds are normal.     Palpations: Abdomen is soft.     Tenderness: There is no rebound.  Musculoskeletal:        General: No tenderness. Normal range of motion.     Right lower leg: No edema (2+).     Left lower leg: Edema (2+) present.  Lymphadenopathy:     Cervical: No cervical adenopathy.  Skin:    General: Skin is warm and dry.     Findings: Lesion (Superficial sin teat on right upper chest wall where dressing was placed for RIJ line) present.  Neurological:     General: No focal deficit present.     Mental Status: She is alert and oriented to person, place, and time.     Cranial Nerves: No cranial nerve deficit.      Lab Results: BMP Recent Labs    12/24/20 0421 12/25/20 0308 12/27/20 0337  NA  129* 130* 132*  K 4.2 4.4 4.6  CL 99 101 102  CO2 24 24 23   GLUCOSE 138* 166* 202*  BUN 65* 50* 32*  CREATININE 1.69* 1.38* 1.48*  CALCIUM 7.5* 7.5* 8.0*  GFRNONAA 35* 45* 41*    CBC Recent Labs  Lab 12/27/20 0337  WBC 5.8  RBC 3.15*  HGB 9.6*  HCT 29.6*  PLT 92*  MCV 94.0  MCH 30.5  MCHC 32.4  RDW 15.0    HEMOGLOBIN A1C Lab Results  Component Value Date   HGBA1C 7.8 (H) 12/19/2020   MPG 177.16 12/19/2020    Cardiac Panel (last 3 results) Results for LAILYNN, SOUTH[place]ATE (MRN [id number]) as of 12/20/2020 11:29  Ref. Range 12/19/2020 07:15 12/19/2020 11:54  Troponin I (High Sensitivity) Latest Ref Range: <18 ng/L Colleen,829 (HH) >27,000 (HH)   BNP (last 3 results) N/A  TSH N/A  Lipid Panel     Component Value Date/Time   CHOL 120 12/20/2020 0415   CHOL 164 10/08/2019 0901   TRIG 96 12/20/2020 0415   HDL 59 12/20/2020 0415   HDL 99 10/08/2019 0901   CHOLHDL 2.0 12/20/2020 0415   VLDL 19 12/20/2020 0415   LDLCALC 42 12/20/2020 0415  LDLCALC 54 10/08/2019 0901   LDLDIRECT 61 10/08/2019 0901     Hepatic Function Panel Recent Labs    12/20/20 0415 12/21/20 0328 12/22/20 0318 12/23/20 0248  PROT 5.6* 5.4* 5.3* 5.1*  ALBUMIN 3.4* 3.3* 3.1* 2.8*  AST 4,893* 2,521* 1,235* 628*  ALT 2,931* 2,551* 1,831* 1,379*  ALKPHOS 114 125 122 104  BILITOT 2.0* 1.8* 1.6* 2.2*  BILIDIR 0.5*  --   --   --   IBILI 1.5*  --   --   --     Imaging: Chest x-ray 12/12/2020: Trachea is midline. Heart is enlarged, stable. Thoracic aorta is calcified. Right IJ central line tip is in the brachiocephalic vein junction region. Transvenous pacemaker is seen from an IVC approach with tip projecting over the right ventricle. Defect related pads are in place.  Lungs are somewhat low in volume with minimal bibasilar subsegmental atelectasis. No pleural fluid.  IMPRESSION: Low lung volumes with bibasilar subsegmental atelectasis.  CT head 12/19/2020: Normal appearance of the  brain.  Cardiac Studies:  EKG 12/21/2020: Sinus rhythm with 2:1 AV block Ventricular rate 42 bpm Inferior infarct, likely recent  EKG 12/20/2020: Sinus rhythm with complete AV block.   Asystole without ventricular escape rhythm. Two ventricular pacing complexes seen.  Echocardiogram 12/19/2020: 1. Left ventricular ejection fraction, by estimation, is 55 to 60%. The  left ventricle has normal function. The left ventricle demonstrates  regional wall motion abnormalities (see scoring diagram/findings for  description). Left ventricular diastolic  parameters are indeterminate. There is mild hypokinesis of the left  ventricular, basal inferolateral wall.  2. Right ventricular systolic function is moderately reduced. The right  ventricular size is moderately enlarged. Estimated PASP 33 mmHg.  3. Right atrial size was mildly dilated.  4. Tricuspid valve regurgitation is severe.  5. The inferior vena cava is normal in size with <50% respiratory  variability, suggesting right atrial pressure of 8 mmHg.  6. Comapared to previous study report in 2018, LV/RV wall motion  abnormalities are new.   Assessment & Recommendations:  55 y.o. Caucasian [name]  , pharmacist by profession, with type 2 diabetes mellitus, inferior STEMI with no revascularization related presentation and AKI, complete AV block.  Inferior STEMI: Primarily RV infarct with moderate RV dilatation and dysfunction.  High-sensitivity troponin > 27,000. Late presentation. Unable to revascularize due to presentaiton Cr of 4.3, increased to 5.2, now down to 1.3-1.5. Currently chest pain-free.  Hemodynamically stable. Ostial RCA occlusion (Culprit). Mid LCx CTOm recanalization with collaterals. Mid LAD 70% stenosis Recommend stress perfusion MRI with contrast to assess ischemia and viability. This will be done outpatient after complete recovery of AV node Continue aspirin, Brilinta Check LFT today. Currently holding statin due  to liver enzyme elevation.  Holding beta-blocker, ACE due to complete heart block and AKI.  RV failure: Hemodynamically stable.  Has 2+ edema today. Will give lasix 40 mg VI daily Will hope to add ARNI/ARB before discharge.  Complete AV block: Ischemic in etiology. Now recovered to Mobitz type 1 AV block Temporary pacemaker pulled (4/4) Appreciate EP input. Hold beta blockers for now  AKI: Resolved. Cr slightly increased from 1.38 to 1.48 today. I suspect this is due to congestion from RV failure.  Lasix 40 mg IV daily. Monitor I/O, Cr.  Hyponatremia: Dilutional 2/2 heart failure. Diurese.  Type 1 DM:  Insulin Electrolytes stable today.  CRITICAL CARE Performed by: Vernell Leep   Total critical care time: 30 minutes   Critical care time was exclusive of  separately billable procedures and treating other patients.   Critical care was necessary to treat or prevent imminent or life-threatening deterioration.   Critical care was time spent personally by me on the following activities: development of treatment plan with patient and/or surrogate as well as nursing, discussions with consultants, evaluation of patient's response to treatment, examination of patient, obtaining history from patient or surrogate, ordering and performing treatments and interventions, ordering and review of laboratory studies, ordering and review of radiographic studies, pulse oximetry and re-evaluation of patient's condition.      Ma.nish Esther Hardy, MD Pager: [phone] Office: [phone]

## 2020-12-27 NOTE — Progress Notes (Signed)
Paged MD regarding bradycardic episodes (see captured strips). EKG obtained. Per MD, continue to monitor and page again if HR < 30 or patient becomes symptomatic.

## 2020-12-27 NOTE — Progress Notes (Addendum)
Electrophysiology Rounding Note  Patient Name: Colleen [name] Date of Encounter: 12/27/2020  Primary Cardiologist: Dr. Einar Gip Electrophysiologist: New to Dr. Curt Bears  Subjective   Feeling good. Eating breakfast. "Is it crazy my electricity is back to normal?"  Inpatient Medications    Scheduled Meds: . aspirin EC  81 mg Oral Daily  . Chlorhexidine Gluconate Cloth  6 each Topical Daily  . famotidine  20 mg Oral BID  . fentaNYL (SUBLIMAZE) injection  25 mcg Intravenous Once  . insulin aspart  0-5 Units Subcutaneous QHS  . insulin aspart  0-9 Units Subcutaneous TID WC  . insulin detemir  18 Units Subcutaneous Q12H  . nitroGLYCERIN  0.3 mg Transdermal Daily  . pantoprazole  40 mg Oral Daily  . sodium chloride flush  10-40 mL Intracatheter Q12H  . sodium chloride flush  3 mL Intravenous Q12H  . sodium chloride flush  3 mL Intravenous Q12H  . ticagrelor  90 mg Oral BID   Continuous Infusions: . sodium chloride    . sodium chloride     PRN Meds: sodium chloride, sodium chloride, acetaminophen, albuterol, alum & mag hydroxide-simeth, bisacodyl, dextrose, lip balm, metoCLOPramide, nitroGLYCERIN, ondansetron (ZOFRAN) IV, oxyCODONE, polyethylene glycol, senna, sodium chloride flush, sodium chloride flush, sodium chloride flush   Vital Signs    Vitals:   12/27/20 0400 12/27/20 0500 12/27/20 0600 12/27/20 0640  BP: (!) 157/74 122/69 119/60   Pulse: 65 61 63   Resp: 16 16 14    Temp: 98.8 F (37.1 C)   98.2 F (36.8 C)  TempSrc: Oral   Oral  SpO2: 99% 97% 99%   Weight:      Height:        Intake/Output Summary (Last 24 hours) at 12/27/2020 0726 Last data filed at 12/27/2020 0500 Gross per 24 hour  Intake 720 ml  Output 1525 ml  Net -805 ml   Filed Weights   12/22/20 0309 12/23/20 0507 12/24/20 0526  Weight: 97 kg 98 kg 101 kg    Physical Exam    GEN- The patient is well appearing, alert and oriented x 3 today.   Head- normocephalic, atraumatic Eyes-  Sclera clear,  conjunctiva pink Ears- hearing intact Oropharynx- clear Neck- supple Lungs- Clear to ausculation bilaterally, normal work of breathing Heart- Regular rate and rhythm, no murmurs, rubs or gallops GI- soft, NT, ND, + BS Extremities- no clubbing or cyanosis. No edema Skin- no rash or lesion Psych- euthymic mood, full affect Neuro- strength and sensation are intact  Labs    CBC Recent Labs    12/26/20 0113 12/27/20 0337  WBC 6.1 5.8  HGB 9.4* 9.6*  HCT 28.7* 29.6*  MCV 91.1 94.0  PLT 97* 92*   Basic Metabolic Panel Recent Labs    12/25/20 0308 12/26/20 0113 12/27/20 0337  NA 130*  --  132*  K 4.4  --  4.6  CL 101  --  102  CO2 24  --  23  GLUCOSE 166*  --  202*  BUN 50*  --  32*  CREATININE 1.38*  --  1.48*  CALCIUM 7.5*  --  8.0*  MG 2.9* 2.9* 2.6*   Liver Function Tests No results for input(s): AST, ALT, ALKPHOS, BILITOT, PROT, ALBUMIN in the last 72 hours. No results for input(s): LIPASE, AMYLASE in the last 72 hours. Cardiac Enzymes No results for input(s): CKTOTAL, CKMB, CKMBINDEX, TROPONINI in the last 72 hours.   Telemetry    NSR 60-70s, Mobitz 1 (personally  reviewed)  Radiology    No results found.  Patient Profile        55 y.o. female with a hx of Type I DM  Admitted about 3 days of N/V, had a syncopal event the day of her admission, she was found w/inferior STEMI and CHB Planned for emergent cath and temp wire though her creat came back 4.3 and bedside echo noted presevred LVEF and LHC was deferred and temp pacing wire was placed. She did have RV dysfunction and suspected that her infarct likely started Friday with her symptoms. Trops >75000  CHB has resolved and now NSR in 60-70s with Mobitz 1.  Assessment & Plan    1. CHB -> resolved -> Mobitz 1 Pt currently in Mobitz one block Pt and husband would prefer to avoid pacing if possible.  We will follow at a distance; should her conduction worsen, would proceed with pacing at that time.  Discussed alarm symptoms such as syncope, frequent near syncope, SOB at rest, and a sharp, persistent decrease in stamina/exercise tolerance. Suspect can pull temp pacer and observe  2. CAD Cath 4/1/ with: LM: Normal LAD: Prox mod calcific 40% stenosis (Unchanges compared to cath 2018) LCx: Mid 100% occlusion, likely chronic. Reconstitution through retrograde filling from OM2 (New since cath 2018) RCA: Ostial 100% occlusion, likely the culprit for her STEMI with late presentation         Retrograde filling from LAD up to distal RCA (New since cath 2018) Dr. Virgina Jock to discuss ischemia guided revascularization in the future for LCx +/- LAD  3. AKI Resolved.  Cr 1.4 this am.   For questions or updates, please contact [place] Please consult [email or web address] for contact info under Cardiology/STEMI.  Signed, [name], PA-C  12/27/2020, 7:26 AM   EP Attending  Patient seen and examined. Agree with above. The patient's AV conduction has returned. No indication for PPM. Agree with removal of temp wire. Issue of beta blocker is a little tricky. I would probably hold off for now and consider starting coreg 3.125 mg bid as an outpatient. Note ongoing ischemia evaluation. EP will sign off.   Carleene Overlie Chayden Garrelts,MD

## 2020-12-28 ENCOUNTER — Other Ambulatory Visit (HOSPITAL_COMMUNITY): Payer: Self-pay

## 2020-12-28 DIAGNOSIS — I4892 Unspecified atrial flutter: Secondary | ICD-10-CM

## 2020-12-28 DIAGNOSIS — I480 Paroxysmal atrial fibrillation: Secondary | ICD-10-CM

## 2020-12-28 HISTORY — DX: Unspecified atrial flutter: I48.92

## 2020-12-28 LAB — BASIC METABOLIC PANEL
Anion gap: 7 (ref 5–15)
BUN: 30 mg/dL — ABNORMAL HIGH (ref 6–20)
CO2: 27 mmol/L (ref 22–32)
Calcium: 8.1 mg/dL — ABNORMAL LOW (ref 8.9–10.3)
Chloride: 99 mmol/L (ref 98–111)
Creatinine, Ser: 1.62 mg/dL — ABNORMAL HIGH (ref 0.44–1.00)
GFR, Estimated: 37 mL/min — ABNORMAL LOW (ref >60)
Glucose, Bld: 169 mg/dL — ABNORMAL HIGH (ref 70–99)
Potassium: 4.4 mmol/L (ref 3.5–5.1)
Sodium: 133 mmol/L — ABNORMAL LOW (ref 135–145)

## 2020-12-28 LAB — CBC
HCT: 29.1 % — ABNORMAL LOW (ref 36.0–46.0)
Hemoglobin: 9.5 g/dL — ABNORMAL LOW (ref 12.0–15.0)
MCH: 30.4 pg (ref 26.0–34.0)
MCHC: 32.6 g/dL (ref 30.0–36.0)
MCV: 93 fL (ref 80.0–100.0)
Platelets: 99 10*3/uL — ABNORMAL LOW (ref 150–400)
RBC: 3.13 MIL/uL — ABNORMAL LOW (ref 3.87–5.11)
RDW: 14.9 % (ref 11.5–15.5)
WBC: 6.1 10*3/uL (ref 4.0–10.5)
nRBC: 0 % (ref 0.0–0.2)

## 2020-12-28 LAB — MAGNESIUM: Magnesium: 2.1 mg/dL (ref 1.7–2.4)

## 2020-12-28 LAB — GLUCOSE, CAPILLARY
Glucose-Capillary: 135 mg/dL — ABNORMAL HIGH (ref 70–99)
Glucose-Capillary: 162 mg/dL — ABNORMAL HIGH (ref 70–99)
Glucose-Capillary: 201 mg/dL — ABNORMAL HIGH (ref 70–99)
Glucose-Capillary: 202 mg/dL — ABNORMAL HIGH (ref 70–99)

## 2020-12-28 MED ORDER — ROSUVASTATIN CALCIUM 5 MG PO TABS
10.0000 mg | ORAL_TABLET | Freq: Every day | ORAL | Status: DC
  Administered 2020-12-28 – 2020-12-29 (×2): 10 mg via ORAL
  Filled 2020-12-28 (×2): qty 2

## 2020-12-28 MED ORDER — APIXABAN 5 MG PO TABS
5.0000 mg | ORAL_TABLET | Freq: Two times a day (BID) | ORAL | Status: DC
  Administered 2020-12-28 – 2020-12-30 (×5): 5 mg via ORAL
  Filled 2020-12-28 (×4): qty 1

## 2020-12-28 MED ORDER — CLOPIDOGREL BISULFATE 75 MG PO TABS
75.0000 mg | ORAL_TABLET | Freq: Every day | ORAL | Status: DC
  Administered 2020-12-29 – 2020-12-30 (×2): 75 mg via ORAL
  Filled 2020-12-28 (×2): qty 1

## 2020-12-28 MED ORDER — CLOPIDOGREL BISULFATE 75 MG PO TABS
600.0000 mg | ORAL_TABLET | Freq: Once | ORAL | Status: AC
  Administered 2020-12-28: 600 mg via ORAL
  Filled 2020-12-28: qty 8

## 2020-12-28 NOTE — Progress Notes (Signed)
ANTICOAGULATION CONSULT NOTE - Initial Consult  Pharmacy Consult for Apixaban Indication: atrial fibrillation  Allergies  Allergen Reactions  . Other Other (See Comments)  . Succinylcholine Other (See Comments)    Pseudocholinesterase deficiency    Patient Measurements: Height: 5\' 3"  (160 cm) Weight: 100.8 kg (222 lb 3.2 oz) IBW/kg (Calculated) : 52.4  Vital Signs: Temp: 98.4 F (36.9 C) (04/05 0852) Temp Source: Oral (04/05 0852) BP: 125/53 (04/05 0852) Pulse Rate: 72 (04/05 0852)  Labs: Recent Labs    12/26/20 0113 12/27/20 0337 12/28/20 0416  HGB 9.4* 9.6* 9.5*  HCT 28.7* 29.6* 29.1*  PLT 97* 92* 99*  CREATININE  --  1.48* 1.62*    Estimated Creatinine Clearance: 44 mL/min (A) (by C-G formula based on SCr of 1.62 mg/dL (H)).   Medical History: Past Medical History:  Diagnosis Date  . Asthma   . Complication of anesthesia    Psuedocholinerasterase deficiency per pt  . Diabetes mellitus    TYPE II  . Diabetic macular edema (HCC)    Pt gets injections in eyes with [facility]  . Seasonal allergies    Assessment: 55 yo W with Mobitz type 1 AV block and paroxysmal atrial fibrillation 4/4-4/5 overnight (CHADS2VASc = 3). Pharmacy consulted for apixaban.   ClCr ~44 ml/min  Goal of Therapy:  Monitor platelets by anticoagulation protocol: Yes   Plan:  Start apixaban 5mg  BID  Monitor for signs/symptoms of bleeding    Benetta Spar, PharmD, BCPS, BCCP Clinical Pharmacist  Please check AMION for all [place] phone numbers After 10:00 PM, call [place] [phone]

## 2020-12-28 NOTE — Care Management Important Message (Signed)
Important Message  Patient Details  Name: Colleen [name] MRN: [id number] Date of Birth: 1966-01-08   Medicare Important Message Given:  Yes     Shelda Altes 12/28/2020, 11:45 AM

## 2020-12-28 NOTE — Progress Notes (Signed)
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  Pt admitted with complete AV block, STEMI.   3/31- s/p Procedure(s): RIGHT/LEFT HEART CATH AND CORONARY ANGIOGRAPHY (N/A  Reviewed I/O's: -1.8 L x 24 hours and -2.8 L since admission  UOP: 3 L x 24 hours  Pt unavailable at time of visit. Attempted to speak with pt via phone call to hospital room, however, unable to reach.   Lab Results  Component Value Date   HGBA1C 7.8 (H) 12/19/2020   PTA DM medications are Medtronic insulin pump 670G with humalog insulin.   Labs reviewed: Na: 133, CBGS: 135 (inpatient orders for glycemic control are 0-9 units insulin aspart TID with meals and 18 units insulin detemir BID).   Current diet order is carb modified, patient is consuming approximately 50-80% of meals at this time. Labs and medications reviewed.   No nutrition interventions warranted at this time. If nutrition issues arise, please consult RD.   Loistine Chance, RD, LDN, [place] Registered Dietitian II Certified Diabetes Care and Education Specialist Please refer to [facility] for RD and/or RD on-call/weekend/after hours pager

## 2020-12-28 NOTE — Progress Notes (Signed)
CARDIAC REHAB PHASE I   MI materials along with heart healthy and diabetic diets left at bedside. Will f/u tomorrow.  Rufina Falco, RN BSN 12/28/2020 3:03 PM

## 2020-12-28 NOTE — Progress Notes (Signed)
Subjective: Swelling improved, but still has it in legs  Overnight bradycardia in 30s at night, rhythm was Afib Also had paroxysmal atrial flutter In sinus rhythm this morning  Objective:  Vital Signs in the last 24 hours: Temp:  [98.2 F (36.8 C)-99 F (37.2 C)] 98.2 F (36.8 C) (04/05 1209) Pulse Rate:  [72-80] 72 (04/05 1209) Resp:  [18-20] 18 (04/05 1209) BP: (121-155)/(53-74) 127/64 (04/05 1209) SpO2:  [96 %-100 %] 98 % (04/05 1209) Weight:  [100.8 kg-101.1 kg] 100.8 kg (04/05 0354)  Intake/Output from previous day: 04/04 0701 - 04/05 0700 In: 1203 [P.O.:1200; I.V.:3] Out: 3000 [Urine:3000]  Physical Exam Vitals and nursing note reviewed.  Constitutional:      General: She is not in acute distress.    Appearance: She is well-developed. She is not ill-appearing.  HENT:     Head: Normocephalic and atraumatic.  Eyes:     Conjunctiva/sclera: Conjunctivae normal.     Pupils: Pupils are equal, round, and reactive to light.  Neck:     Vascular: No JVD.  Cardiovascular:     Rate and Rhythm: Normal rate and regular rhythm.     Pulses: Normal pulses and intact distal pulses.     Heart sounds: No murmur heard.   Pulmonary:     Effort: Pulmonary effort is normal.     Breath sounds: Normal breath sounds. No wheezing or rales.  Abdominal:     General: Bowel sounds are normal.     Palpations: Abdomen is soft.     Tenderness: There is no rebound.  Musculoskeletal:        General: No tenderness. Normal range of motion.     Right lower leg: No edema (2+).     Left lower leg: Edema (2+) present.  Lymphadenopathy:     Cervical: No cervical adenopathy.  Skin:    General: Skin is warm and dry.     Findings: Lesion (Superficial sin teat on right upper chest wall where dressing was placed for RIJ line) present.  Neurological:     General: No focal deficit present.     Mental Status: She is alert and oriented to person, place, and time.     Cranial Nerves: No cranial nerve  deficit.      Lab Results: BMP Recent Labs    12/25/20 0308 12/27/20 0337 12/28/20 0416  NA 130* 132* 133*  K 4.4 4.6 4.4  CL 101 102 99  CO2 24 23 27   GLUCOSE 166* 202* 169*  BUN 50* 32* 30*  CREATININE 1.38* 1.48* 1.62*  CALCIUM 7.5* 8.0* 8.1*  GFRNONAA 45* 41* 37*    CBC Recent Labs  Lab 12/28/20 0416  WBC 6.1  RBC 3.13*  HGB 9.5*  HCT 29.1*  PLT 99*  MCV 93.0  MCH 30.4  MCHC 32.6  RDW 14.9    HEMOGLOBIN A1C Lab Results  Component Value Date   HGBA1C 7.8 (H) 12/19/2020   MPG 177.16 12/19/2020    Cardiac Panel (last 3 results) Results for Colleen, [name] (MRN [id number]) as of 12/20/2020 11:29  Ref. Range 12/19/2020 07:15 12/19/2020 11:54  Troponin I (High Sensitivity) Latest Ref Range: <18 ng/L 75,829 (HH) >27,000 (HH)   BNP (last 3 results) N/A  TSH N/A  Lipid Panel     Component Value Date/Time   CHOL 120 12/20/2020 0415   CHOL 164 10/08/2019 0901   TRIG 96 12/20/2020 0415   HDL 59 12/20/2020 0415   HDL 99 10/08/2019 0901  CHOLHDL 2.0 12/20/2020 0415   VLDL 19 12/20/2020 0415   LDLCALC 42 12/20/2020 0415   LDLCALC 54 10/08/2019 0901   LDLDIRECT 61 10/08/2019 0901     Hepatic Function Panel Recent Labs    12/20/20 0415 12/21/20 0328 12/22/20 0318 12/23/20 0248 12/27/20 0941  PROT 5.6*   < > 5.3* 5.1* 5.7*  ALBUMIN 3.4*   < > 3.1* 2.8* 2.8*  AST 4,893*   < > 1,235* 628* 61*  ALT 2,931*   < > 1,831* 1,379* 343*  ALKPHOS 114   < > 122 104 152*  BILITOT 2.0*   < > 1.6* 2.2* 1.9*  BILIDIR 0.5*  --   --   --  0.5*  IBILI 1.5*  --   --   --  1.4*   < > = values in this interval not displayed.    Imaging: Chest x-ray 12/12/2020: Trachea is midline. Heart is enlarged, stable. Thoracic aorta is calcified. Right IJ central line tip is in the brachiocephalic vein junction region. Transvenous pacemaker is seen from an IVC approach with tip projecting over the right ventricle. Defect related pads are in place.  Lungs are somewhat  low in volume with minimal bibasilar subsegmental atelectasis. No pleural fluid.  IMPRESSION: Low lung volumes with bibasilar subsegmental atelectasis.  CT head 12/19/2020: Normal appearance of the brain.  Cardiac Studies:  EKG 12/21/2020: Sinus rhythm with 2:1 AV block Ventricular rate 42 bpm Inferior infarct, likely recent  EKG 12/20/2020: Sinus rhythm with complete AV block.   Asystole without ventricular escape rhythm. Two ventricular pacing complexes seen.  Echocardiogram 12/19/2020: 1. Left ventricular ejection fraction, by estimation, is 55 to 60%. The  left ventricle has normal function. The left ventricle demonstrates  regional wall motion abnormalities (see scoring diagram/findings for  description). Left ventricular diastolic  parameters are indeterminate. There is mild hypokinesis of the left  ventricular, basal inferolateral wall.  2. Right ventricular systolic function is moderately reduced. The right  ventricular size is moderately enlarged. Estimated PASP 33 mmHg.  3. Right atrial size was mildly dilated.  4. Tricuspid valve regurgitation is severe.  5. The inferior vena cava is normal in size with <50% respiratory  variability, suggesting right atrial pressure of 8 mmHg.  6. Comapared to previous study report in 2018, LV/RV wall motion  abnormalities are new.   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  Inferior STEMI: Primarily RV infarct with moderate RV dilatation and dysfunction.  High-sensitivity troponin > 27,000. Late presentation. Unable to revascularize due to presentaiton Cr of 4.3, increased to 5.2, now down to 1.3-1.5. Currently chest pain-free.  Hemodynamically stable. Ostial RCA occlusion (Culprit). Mid LCx CTOm recanalization with collaterals. Mid LAD 70% stenosis Recommend  stress perfusion MRI with contrast to assess ischemia and viability. This will be done outpatient after complete recovery of AV node Recommend Aspirin and plavix (along with eliquis for Afib/flutter) LFT's coming down. Will resume Crestor 10 mg. Holding beta-blocker due recent AV nodal infarct. Holding ACE due to AKI.  RV failure: Hemodynamically stable.  Has 1+ edema today. Continue IV lasix 40 mg daily Will hope to add ARNI/ARB before discharge.  Paroxysmal Afib/flutter: New onset. Currently in sinus rhythm 1:1 CHA2DS2VASc score 3, annual stroke risk 3.6% Started eliquis 5 mg bid  Compelte AV block: Now resolved. 1:1 conduction today. Still had episodes of rate  in 23s while in Afib at night. Continue to hold beta blocker for now  AKI: Cr 4.3-->5.2-->1.38-->1.64 3 L urine output in 24 hrs I suspect this is due to congestion from RV failure.  Lasix 40 mg IV daily. Monitor I/O, Cr.  Hyponatremia: Dilutional 2/2 heart failure. Improving. Diurese.  Type 1 DM:  Insulin Electrolytes stable today.  OT/PT consult for discharge planning Expect another 1-2 days of hospital stay   Quintin Alto, MD Pager: [phone] Office: [phone]

## 2020-12-28 NOTE — TOC Benefit Eligibility Note (Signed)
Patient Teacher, English as a foreign language completed.    The patient is currently admitted and upon discharge could be taking Eliquis 5 mg.  The current 30 day co-pay is, $50.00.   The patient is insured through [place], [place] Patient Advocate Specialist [place] Team Direct Number: [phone]  Fax: [phone]

## 2020-12-28 NOTE — Consult Note (Signed)
[place] Nurse Consult Note: Patient receiving care in [place]. Reason for Consult: skin tears to right clavicular area and right upper thigh Wound type: skin tears  Pressure Injury POA: Yes/No/NA Measurement: Wound bed: pink to both Drainage (amount, consistency, odor)  Periwound: Dressing procedure/placement/frequency: Gently cleanse right clavicular and right upper thigh skin tear areas with saline. Pat dry. Place a piece of Xeroform over the areas, then a foam dressing. The foam dressing can remain in place up to 3 days. The Xeroform must be changed daily Thank you for the consult.  Discussed plan of care with the patient and bedside nurse.  [facility] nurse will not follow at this time.  Please re-consult the [facility] team if needed.  Val Riles, RN, MSN, CWOCN, CNS-BC, pager [phone]

## 2020-12-28 NOTE — Evaluation (Addendum)
Physical Therapy Evaluation Patient Details Name: Colleen [name] MRN: [id number] DOB: [birth year] Today's Date: 12/28/2020   History of Present Illness  Pt is a 55 y/o female admitted 3/27 secondary to nausea/vomiting. Found to have complete heart block and STEMI and was taken to have temporary pacer placed on 3/27. Pacer leads removed on 4/4. Pt is also s/p heart cath on 4/1. PMHx of type-1 insulin-dependent DM, HTN, CAD, CKDIII, left ankle arthrodesis 2/2 Charcot joint.  Clinical Impression  Pt admitted secondary to problem above with deficits below. Pt initially tolerated gait well, however, during middle of gait pt complaining of SOB and light headedness; required seated rest. Checked HR and it was at low to mid 30s and oxygen at 100% on RA. Further ambulation deferred and MD/RN notified. HR returned to upper 60s to low 70s after prolonged rest and upon return to supine. Anticipate pt will progress well once symptoms improve. Will continue to follow acutely.    Spoke with MD following session: Stated to hold further mobility until he reassessed. Will follow up as medically appropriate.    Follow Up Recommendations Home health PT    Equipment Recommendations  Rolling walker with 5" wheels    Recommendations for Other Services       Precautions / Restrictions Precautions Precautions: Fall Restrictions Weight Bearing Restrictions: No      Mobility  Bed Mobility Overal bed mobility: Needs Assistance Bed Mobility: Supine to Sit;Sit to Supine     Supine to sit: Supervision Sit to supine: Supervision   General bed mobility comments: Supervision for safety. Pt returned to supine instead of in chair at end of session, as pt with HRs in low to mid 30s. RN present. HR returned to upper 60s low 70s after prolonged rest.    Transfers Overall transfer level: Needs assistance Equipment used: Rolling walker (2 wheeled) Transfers: Sit to/from Stand Sit to Stand: Min guard          General transfer comment: Min guard for safety. Cues for safe hand placement.  Ambulation/Gait Ambulation/Gait assistance: Min guard Gait Distance (Feet): 40 Feet (X2) Assistive device: Rolling walker (2 wheeled) Gait Pattern/deviations: Step-through pattern;Decreased stride length Gait velocity: Decreased   General Gait Details: Pt initally tolerated gait well, however, during middle of gait, pt started complaining of lightheadedness and SOB. HR at low to mid 30s, however, oxygen sats at 100% on RA. Pt requiring seated rest and then ambulated back to room. RN in room at end of session. After prolonged rest and return to supine HR returned to upper 60s to low 70s. Notified MD as well.  Stairs            Wheelchair Mobility    Modified Rankin (Stroke Patients Only)       Balance Overall balance assessment: Needs assistance Sitting-balance support: No upper extremity supported;Feet supported Sitting balance-Leahy Scale: Fair     Standing balance support: Bilateral upper extremity supported;During functional activity Standing balance-Leahy Scale: Poor Standing balance comment: Reliant on BUE support                             Pertinent Vitals/Pain Pain Assessment: No/denies pain    Home Living Family/patient expects to be discharged to:: Private residence Living Arrangements: Spouse/significant other Available Help at Discharge: Family Type of Home: House Home Access: Level entry     Home Layout: Other (Comment) (sunken den with L rail going up) Home Equipment: Civil engineer, contracting -  built in;Grab bars - tub/shower;Cane - single point      Prior Function Level of Independence: Independent with assistive device(s)         Comments: Occasionally uses cane for mobility for balance.     Hand Dominance        Extremity/Trunk Assessment   Upper Extremity Assessment Upper Extremity Assessment: Overall WFL for tasks assessed    Lower Extremity  Assessment Lower Extremity Assessment: Generalized weakness (increased swelling noted bilaterally.)    Cervical / Trunk Assessment Cervical / Trunk Assessment: Normal  Communication   Communication: No difficulties  Cognition Arousal/Alertness: Awake/alert Behavior During Therapy: WFL for tasks assessed/performed Overall Cognitive Status: Within Functional Limits for tasks assessed                                        General Comments      Exercises     Assessment/Plan    PT Assessment Patient needs continued PT services  PT Problem List Decreased strength;Decreased activity tolerance;Decreased balance;Decreased mobility;Cardiopulmonary status limiting activity       PT Treatment Interventions DME instruction;Gait training;Functional mobility training;Therapeutic activities;Stair training;Balance training;Therapeutic exercise;Patient/family education    PT Goals (Current goals can be found in the Care Plan section)  Acute Rehab PT Goals Patient Stated Goal: to feel better PT Goal Formulation: With patient Time For Goal Achievement: 01/11/21 Potential to Achieve Goals: Good    Frequency Min 3X/week   Barriers to discharge        Co-evaluation               AM-PAC PT "6 Clicks" Mobility  Outcome Measure Help needed turning from your back to your side while in a flat bed without using bedrails?: None Help needed moving from lying on your back to sitting on the side of a flat bed without using bedrails?: None Help needed moving to and from a bed to a chair (including a wheelchair)?: A Little Help needed standing up from a chair using your arms (e.g., wheelchair or bedside chair)?: A Little Help needed to walk in hospital room?: A Little Help needed climbing 3-5 steps with a railing? : A Lot 6 Click Score: 19    End of Session Equipment Utilized During Treatment: Gait belt Activity Tolerance: Treatment limited secondary to medical  complications (Comment) (low HR) Patient left: in bed;with call bell/phone within reach;with bed alarm set;with nursing/sitter in room Nurse Communication: Mobility status;Other (comment) (low HR) PT Visit Diagnosis: Other abnormalities of gait and mobility (R26.89);Difficulty in walking, not elsewhere classified (R26.2)    Time: 6578-4696 PT Time Calculation (min) (ACUTE ONLY): 24 min   Charges:   PT Evaluation $PT Eval Moderate Complexity: 1 Mod PT Treatments $Gait Training: 8-22 mins        Lou Miner, DPT  Acute Rehabilitation Services  Pager: [phone] Office: [phone]   Rudean Hitt 12/28/2020, 3:22 PM

## 2020-12-29 DIAGNOSIS — I480 Paroxysmal atrial fibrillation: Secondary | ICD-10-CM

## 2020-12-29 LAB — BASIC METABOLIC PANEL
Anion gap: 8 (ref 5–15)
BUN: 29 mg/dL — ABNORMAL HIGH (ref 6–20)
CO2: 28 mmol/L (ref 22–32)
Calcium: 8.1 mg/dL — ABNORMAL LOW (ref 8.9–10.3)
Chloride: 100 mmol/L (ref 98–111)
Creatinine, Ser: 1.57 mg/dL — ABNORMAL HIGH (ref 0.44–1.00)
GFR, Estimated: 38 mL/min — ABNORMAL LOW (ref >60)
Glucose, Bld: 122 mg/dL — ABNORMAL HIGH (ref 70–99)
Potassium: 3.9 mmol/L (ref 3.5–5.1)
Sodium: 136 mmol/L (ref 135–145)

## 2020-12-29 LAB — CBC
HCT: 28.1 % — ABNORMAL LOW (ref 36.0–46.0)
Hemoglobin: 9.2 g/dL — ABNORMAL LOW (ref 12.0–15.0)
MCH: 30.1 pg (ref 26.0–34.0)
MCHC: 32.7 g/dL (ref 30.0–36.0)
MCV: 91.8 fL (ref 80.0–100.0)
Platelets: 103 10*3/uL — ABNORMAL LOW (ref 150–400)
RBC: 3.06 MIL/uL — ABNORMAL LOW (ref 3.87–5.11)
RDW: 14.7 % (ref 11.5–15.5)
WBC: 5.3 10*3/uL (ref 4.0–10.5)
nRBC: 0 % (ref 0.0–0.2)

## 2020-12-29 LAB — MAGNESIUM: Magnesium: 1.9 mg/dL (ref 1.7–2.4)

## 2020-12-29 LAB — GLUCOSE, CAPILLARY
Glucose-Capillary: 77 mg/dL (ref 70–99)
Glucose-Capillary: 86 mg/dL (ref 70–99)
Glucose-Capillary: 168 mg/dL — ABNORMAL HIGH (ref 70–99)
Glucose-Capillary: 207 mg/dL — ABNORMAL HIGH (ref 70–99)
Glucose-Capillary: 189 mg/dL — ABNORMAL HIGH (ref 70–99)
Glucose-Capillary: 239 mg/dL — ABNORMAL HIGH (ref 70–99)

## 2020-12-29 MED ORDER — NITROGLYCERIN 0.4 MG SL SUBL
0.4000 mg | SUBLINGUAL_TABLET | SUBLINGUAL | 1 refills | Status: AC | PRN
Start: 2020-12-29 — End: ?
  Filled 2020-12-29: qty 25, 1d supply, fill #0

## 2020-12-29 MED ORDER — ASPIRIN 81 MG PO TBEC
81.0000 mg | DELAYED_RELEASE_TABLET | Freq: Every day | ORAL | 1 refills | Status: DC
  Filled 2020-12-29: qty 30, 30d supply, fill #0

## 2020-12-29 MED ORDER — CLOPIDOGREL BISULFATE 75 MG PO TABS
75.0000 mg | ORAL_TABLET | Freq: Every day | ORAL | 1 refills | Status: DC
  Filled 2020-12-29: qty 30, 30d supply, fill #0

## 2020-12-29 MED ORDER — APIXABAN 5 MG PO TABS
5.0000 mg | ORAL_TABLET | Freq: Two times a day (BID) | ORAL | 1 refills | Status: DC
  Filled 2020-12-29: qty 60, 30d supply, fill #0

## 2020-12-29 NOTE — Progress Notes (Addendum)
Electrophysiology Rounding Note  Patient Name: Colleen [name] Date of Encounter: 12/29/2020  Primary Cardiologist: No primary care provider on file. Electrophysiologist: Dr. Curt Bears   Subjective   The patient is doing well today.  At this time, the patient denies chest pain, shortness of breath, or any new concerns.  She had lightheadedness working with PT yesterday in the setting of 2:1 block.  Inpatient Medications    Scheduled Meds: . apixaban  5 mg Oral BID  . aspirin EC  81 mg Oral Daily  . Chlorhexidine Gluconate Cloth  6 each Topical Daily  . clopidogrel  75 mg Oral Daily  . famotidine  20 mg Oral BID  . fentaNYL (SUBLIMAZE) injection  25 mcg Intravenous Once  . furosemide  40 mg Intravenous Daily  . insulin aspart  0-5 Units Subcutaneous QHS  . insulin aspart  0-9 Units Subcutaneous TID WC  . insulin detemir  18 Units Subcutaneous Q12H  . nitroGLYCERIN  0.3 mg Transdermal Daily  . pantoprazole  40 mg Oral Daily  . rosuvastatin  10 mg Oral QHS  . sodium chloride flush  10-40 mL Intracatheter Q12H  . sodium chloride flush  3 mL Intravenous Q12H  . sodium chloride flush  3 mL Intravenous Q12H   Continuous Infusions: . sodium chloride    . sodium chloride     PRN Meds: sodium chloride, sodium chloride, acetaminophen, albuterol, alum & mag hydroxide-simeth, bisacodyl, dextrose, lip balm, metoCLOPramide, nitroGLYCERIN, ondansetron (ZOFRAN) IV, oxyCODONE, polyethylene glycol, senna, sodium chloride flush, sodium chloride flush, sodium chloride flush   Vital Signs    Vitals:   12/28/20 1416 12/28/20 2011 12/29/20 0350 12/29/20 0848  BP:  (!) 126/59 (!) 143/71 131/83  Pulse: 68 78 68 71  Resp:  18 19 19   Temp:  98.6 F (37 C) 98.3 F (36.8 C) 98.4 F (36.9 C)  TempSrc:  Oral Oral Oral  SpO2:  99% 97% 99%  Weight:   98.4 kg   Height:        Intake/Output Summary (Last 24 hours) at 12/29/2020 0917 Last data filed at 12/29/2020 0611 Gross per 24 hour  Intake  363 ml  Output 6700 ml  Net -6337 ml   Filed Weights   12/27/20 1546 12/28/20 0354 12/29/20 0350  Weight: 101.1 kg 100.8 kg 98.4 kg    Physical Exam    GEN- The patient is well appearing, alert and oriented x 3 today.   Head- normocephalic, atraumatic Eyes-  Sclera clear, conjunctiva pink Ears- hearing intact Oropharynx- clear Neck- supple Lungs- Clear to ausculation bilaterally, normal work of breathing Heart- Regular rate and rhythm, no murmurs, rubs or gallops GI- soft, NT, ND, + BS Extremities- no clubbing or cyanosis. No edema Skin- no rash or lesion Psych- euthymic mood, full affect Neuro- strength and sensation are intact  Labs    CBC Recent Labs    12/28/20 0416 12/29/20 0335  WBC 6.1 5.3  HGB 9.5* 9.2*  HCT 29.1* 28.1*  MCV 93.0 91.8  PLT 99* 161*   Basic Metabolic Panel Recent Labs    12/28/20 0416 12/29/20 0335 12/29/20 0819  NA 133*  --  136  K 4.4  --  3.9  CL 99  --  100  CO2 27  --  28  GLUCOSE 169*  --  122*  BUN 30*  --  29*  CREATININE 1.62*  --  1.57*  CALCIUM 8.1*  --  8.1*  MG 2.1 1.9  --  Liver Function Tests Recent Labs    12/27/20 0941  AST 61*  ALT 343*  ALKPHOS 152*  BILITOT 1.9*  PROT 5.7*  ALBUMIN 2.8*   No results for input(s): LIPASE, AMYLASE in the last 72 hours. Cardiac Enzymes No results for input(s): CKTOTAL, CKMB, CKMBINDEX, TROPONINI in the last 72 hours.   Telemetry    NSR 60-70s, while walking with PT patient had Mobitz 1 that gradually turned into 2:1 block. Suspect vagal component (personally reviewed)  Radiology    No results found.  Patient Profile     55 y.o. female with a hx of Type I DM  Admitted about 3 days of N/V, had a syncopal event the day of her admission, she was found w/inferior STEMI and CHB Planned for emergent cath and temp wire though her creat came back 4.3 and bedside echo noted presevred LVEF and LHC was deferred and temp pacing wire was placed. She did have RV  dysfunction and suspected that her infarct likely started Friday with her symptoms. Trops >75000  Assessment & Plan    1. AV block CHB resolved post stemi She continues to have intermittent Mobitz 1 block, exacerbated to 2:1 block at times with periods of increased vagal tone. Shared decision making with the patient and Dr. Virgina Jock we [name] continue watchful waiting for now.   No urgent indication for pacing at this time, and suspect her conduction [name] continue to improve as she gets further out from her event.   2. Paroxysmal atrial fibrillation (subclinical) AF noted overnight 4/4-4/5. Total of ~ 2 hrs.  CHA2DS2VASC of at least 3.  Would continue to follow clinically. With subclinical duration and continued diuresis, would be reasonable to watch off of [name], especially given recent plavix load and ASA.  EP [name] follow at a distance. Please call us back with any questions or concerns.    For questions or updates, please contact [place] Please consult [email or web address] for contact info under Cardiology/STEMI.  Signed, [name], PA-C  12/29/2020, 9:17 AM   I have seen and examined this patient with [name].  Agree with above, note added to reflect my findings.  On exam, RRR, no murmurs, lungs clear.  Patient had an episode of 2-1 AV block yesterday.  This was while working with physical therapy.  She did get dizzy.  She has had one-to-one AV conduction since that time.  At this point, would hold off on pacemaker implant.  She was noted to have approximately 2 hours of atrial fibrillation.  She was asymptomatic from this.  She has since been put on Eliquis for a CHA2DS2-VASc of 3.  If she does not have further atrial fibrillation, this can be stopped.  EP to sign off for now.  If further questions arise, feel free to call us back.  [name] M. Bari Leib MD 12/29/2020 10:53 AM

## 2020-12-29 NOTE — Progress Notes (Signed)
Physical Therapy Treatment Patient Details Name: Colleen [name] MRN: [id number] DOB: 11-26-1965 Today's Date: 12/29/2020    History of Present Illness Pt is a 55 y/o female admitted 3/27 secondary to nausea/vomiting. Found to have complete heart block and STEMI and was taken to have temporary pacer placed on 3/27. Pacer leads removed on 4/4. Pt is also s/p heart cath on 4/1. Brady to 53s on 12/28/2020. PMHx of type-1 insulin-dependent DM, HTN, CAD, CKDIII, left ankle arthrodesis 2/2 Charcot joint.    PT Comments    Pt tolerates treatment well with improved ambulation tolerance and stable HR throughout session. Pt does report one instance of lightheadedness but this resolves quickly with brief standing break. Pt will benefit from continued acute PT POC to progress activity tolerance and aide in returning to independent mobility. PT provides instruction for increased trials of ambulation each day to improve endurance. PT continues to recommend HHPT and a RW.   Follow Up Recommendations  Home health PT     Equipment Recommendations  Rolling walker with 5" wheels    Recommendations for Other Services       Precautions / Restrictions Precautions Precautions: Fall Precaution Comments: monitor HR Restrictions Weight Bearing Restrictions: No    Mobility  Bed Mobility Overal bed mobility: Needs Assistance Bed Mobility: Supine to Sit;Sit to Supine     Supine to sit: Modified independent (Device/Increase time) Sit to supine: Modified independent (Device/Increase time)   General bed mobility comments: not assessed, pt received and left in recliner    Transfers Overall transfer level: Modified independent Equipment used: Rolling walker (2 wheeled) Transfers: Sit to/from Stand Sit to Stand: Modified independent (Device/Increase time)         General transfer comment: HR 84 BPM with exertion; no assist; use of RW  Ambulation/Gait Ambulation/Gait assistance: Supervision Gait  Distance (Feet): 160 Feet Assistive device: Rolling walker (2 wheeled) Gait Pattern/deviations: Step-through pattern Gait velocity: Decreased Gait velocity interpretation: <1.8 ft/sec, indicate of risk for recurrent falls General Gait Details: pt with slowed step-through gait, one standing rest break due to lightheadedness   Stairs             Wheelchair Mobility    Modified Rankin (Stroke Patients Only)       Balance Overall balance assessment: Needs assistance Sitting-balance support: No upper extremity supported;Feet supported Sitting balance-Leahy Scale: Good     Standing balance support: Single extremity supported;No upper extremity supported Standing balance-Leahy Scale: Fair Standing balance comment: use of RW for stability; slightly unsteady without it                            Cognition Arousal/Alertness: Awake/alert Behavior During Therapy: WFL for tasks assessed/performed Overall Cognitive Status: Within Functional Limits for tasks assessed                                        Exercises      General Comments General comments (skin integrity, edema, etc.): VSS, HR ranging from 67-87 with mobility. Pt with brief period of lightheadedness which resolves with brief standing rest break      Pertinent Vitals/Pain Pain Assessment: No/denies pain    Home Living Family/patient expects to be discharged to:: Private residence Living Arrangements: Spouse/significant other Available Help at Discharge: Family Type of Home: House Home Access: Level entry   Home Layout: Other (Comment) (  sunken den with L rail going up) Home Equipment: Shower seat - built in;Grab bars - tub/shower;Cane - single point      Prior Function Level of Independence: Independent with assistive device(s)      Comments: Occasionally uses cane for mobility for balance. Pt reports recently retired and on disability due to Charcot and current illnesses.    PT Goals (current goals can now be found in the care plan section) Acute Rehab PT Goals Patient Stated Goal: to feel better Progress towards PT goals: Progressing toward goals    Frequency    Min 3X/week      PT Plan Current plan remains appropriate    Co-evaluation              AM-PAC PT "6 Clicks" Mobility   Outcome Measure  Help needed turning from your back to your side while in a flat bed without using bedrails?: None Help needed moving from lying on your back to sitting on the side of a flat bed without using bedrails?: None Help needed moving to and from a bed to a chair (including a wheelchair)?: None Help needed standing up from a chair using your arms (e.g., wheelchair or bedside chair)?: None Help needed to walk in hospital room?: A Little Help needed climbing 3-5 steps with a railing? : A Little 6 Click Score: 22    End of Session   Activity Tolerance: Patient tolerated treatment well Patient left: in chair;with call bell/phone within reach;with family/visitor present Nurse Communication: Mobility status PT Visit Diagnosis: Other abnormalities of gait and mobility (R26.89);Difficulty in walking, not elsewhere classified (R26.2)     Time: 3354-5625 PT Time Calculation (min) (ACUTE ONLY): 13 min  Charges:  $Therapeutic Activity: 8-22 mins                     Zenaida Niece, PT, DPT Acute Rehabilitation Pager: [phone]    Zenaida Niece 12/29/2020, 4:44 PM

## 2020-12-29 NOTE — Progress Notes (Addendum)
Subjective: Swelling improved, but still has it in legs  Episode of bradycardia in 30s while walking on 12/28/2020, associated with dizziness, shortness of breath. Improved after sitting.  Objective:  Vital Signs in the last 24 hours: Temp:  [98.2 F (36.8 C)-98.6 F (37 C)] 98.4 F (36.9 C) (04/06 0848) Pulse Rate:  [36-78] 71 (04/06 0848) Resp:  [18-19] 19 (04/06 0848) BP: (126-143)/(59-83) 131/83 (04/06 0848) SpO2:  [97 %-100 %] 99 % (04/06 0848) Weight:  [98.4 kg] 98.4 kg (04/06 0350)  Intake/Output from previous day: 04/05 0701 - 04/06 0700 In: 363 [P.O.:360; I.V.:3] Out: 6700 [Urine:6700]  Physical Exam Vitals and nursing note reviewed.  Constitutional:      General: She is not in acute distress.    Appearance: She is well-developed. She is not ill-appearing.  HENT:     Head: Normocephalic and atraumatic.  Eyes:     Conjunctiva/sclera: Conjunctivae normal.     Pupils: Pupils are equal, round, and reactive to light.  Neck:     Vascular: No JVD.  Cardiovascular:     Rate and Rhythm: Normal rate and regular rhythm.     Pulses: Normal pulses and intact distal pulses.     Heart sounds: No murmur heard.   Pulmonary:     Effort: Pulmonary effort is normal.     Breath sounds: Normal breath sounds. No wheezing or rales.  Abdominal:     General: Bowel sounds are normal.     Palpations: Abdomen is soft.     Tenderness: There is no rebound.  Musculoskeletal:        General: No tenderness. Normal range of motion.     Right lower leg: Edema (1+) present.     Left lower leg: Edema (1+) present.  Lymphadenopathy:     Cervical: No cervical adenopathy.  Skin:    General: Skin is warm and dry.     Findings: Lesion (Superficial skin tear on right upper chest wall where dressing was placed for RIJ line) present.  Neurological:     General: No focal deficit present.     Mental Status: She is alert and oriented to person, place, and time.     Cranial Nerves: No cranial nerve  deficit.      Lab Results: BMP Recent Labs    12/27/20 0337 12/28/20 0416 12/29/20 0819  NA 132* 133* 136  K 4.6 4.4 3.9  CL 102 99 100  CO2 23 27 28   GLUCOSE 202* 169* 122*  BUN 32* 30* 29*  CREATININE 1.48* 1.62* 1.57*  CALCIUM 8.0* 8.1* 8.1*  GFRNONAA 41* 37* 38*    CBC Recent Labs  Lab 12/29/20 0335  WBC 5.3  RBC 3.06*  HGB 9.2*  HCT 28.1*  PLT 103*  MCV 91.8  MCH 30.1  MCHC 32.7  RDW 14.7    HEMOGLOBIN A1C Lab Results  Component Value Date   HGBA1C 7.8 (H) 12/19/2020   MPG 177.16 12/19/2020    Cardiac Panel (last 3 results) Results for ARLETTE, SCHAAD (MRN [id number]) as of 12/20/2020 11:29  Ref. Range 12/19/2020 07:15 12/19/2020 11:54  Troponin I (High Sensitivity) Latest Ref Range: <18 ng/L 75,829 (HH) >27,000 (HH)   BNP (last 3 results) N/A  TSH N/A  Lipid Panel     Component Value Date/Time   CHOL 120 12/20/2020 0415   CHOL 164 10/08/2019 0901   TRIG 96 12/20/2020 0415   HDL 59 12/20/2020 0415   HDL 99 10/08/2019 0901   CHOLHDL 2.0  12/20/2020 0415   VLDL 19 12/20/2020 0415   LDLCALC 42 12/20/2020 0415   LDLCALC 54 10/08/2019 0901   LDLDIRECT 61 10/08/2019 0901     Hepatic Function Panel Recent Labs    12/20/20 0415 12/21/20 0328 12/22/20 0318 12/23/20 0248 12/27/20 0941  PROT 5.6*   < > 5.3* 5.1* 5.7*  ALBUMIN 3.4*   < > 3.1* 2.8* 2.8*  AST 4,893*   < > 1,235* 628* 61*  ALT 2,931*   < > 1,831* 1,379* 343*  ALKPHOS 114   < > 122 104 152*  BILITOT 2.0*   < > 1.6* 2.2* 1.9*  BILIDIR 0.5*  --   --   --  0.5*  IBILI 1.5*  --   --   --  1.4*   < > = values in this interval not displayed.    Imaging: Chest x-ray 12/12/2020: Trachea is midline. Heart is enlarged, stable. Thoracic aorta is calcified. Right IJ central line tip is in the brachiocephalic vein junction region. Transvenous pacemaker is seen from an IVC approach with tip projecting over the right ventricle. Defect related pads are in place.  Lungs are somewhat  low in volume with minimal bibasilar subsegmental atelectasis. No pleural fluid.  IMPRESSION: Low lung volumes with bibasilar subsegmental atelectasis.  CT head 12/19/2020: Normal appearance of the brain.  Cardiac Studies:  EKG 12/21/2020: Sinus rhythm with 2:1 AV block Ventricular rate 42 bpm Inferior infarct, likely recent  EKG 12/20/2020: Sinus rhythm with complete AV block.   Asystole without ventricular escape rhythm. Two ventricular pacing complexes seen.  Echocardiogram 12/19/2020: 1. Left ventricular ejection fraction, by estimation, is 55 to 60%. The  left ventricle has normal function. The left ventricle demonstrates  regional wall motion abnormalities (see scoring diagram/findings for  description). Left ventricular diastolic  parameters are indeterminate. There is mild hypokinesis of the left  ventricular, basal inferolateral wall.  2. Right ventricular systolic function is moderately reduced. The right  ventricular size is moderately enlarged. Estimated PASP 33 mmHg.  3. Right atrial size was mildly dilated.  4. Tricuspid valve regurgitation is severe.  5. The inferior vena cava is normal in size with <50% respiratory  variability, suggesting right atrial pressure of 8 mmHg.  6. Comapared to previous study report in 2018, LV/RV wall motion  abnormalities are new.   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  Inferior STEMI: Primarily RV infarct with moderate RV dilatation and dysfunction.  High-sensitivity troponin > 27,000. Late presentation. Unable to revascularize due to presentaiton Cr of 4.3, increased to 5.2, now down to 1.3-1.5. Currently chest pain-free.  Hemodynamically stable. Ostial RCA occlusion (Culprit). Mid LCx CTOm recanalization with collaterals. Mid LAD 70% stenosis Recommend  stress perfusion MRI with contrast to assess ischemia and viability. This will be done outpatient after complete recovery of AV node Recommend Aspirin and plavix (along with eliquis for Afib/flutter) LFT's coming down. Resumed Crestor 10 mg (12/28/2020). Holding beta-blocker due recent AV nodal infarct. Holding ACE due to AKI.  RV failure: Hemodynamically stable.  Has 1+ edema today. Continue IV lasix 40 mg daily. Diuresing copiously. Expect one more day of diuresis. Will hope to add ARNI/ARB before discharge.  Paroxysmal Afib/flutter: New onset. Currently in sinus rhythm 1:1 CHA2DS2VASc score 3, annual stroke risk 3.6% Started eliquis 5 mg bid Expect eliquis could be discontinued after 4 weeks  Compelte AV block: Now resolved. 1:1 conduction today. Still had episodes of rate in 30s while in Afib at night. Continue to hold beta blocker for now  AKI: Improving. Cr 4.3-->5.2-->1.38-->1.64-->1.57 6.7 L urine output in 24 hrs I suspect this is due to congestion from RV failure.  Lasix 40 mg IV daily. Monitor I/O, Cr.  Hyponatremia: Dilutional 2/2 heart failure. Improving. Diurese.  Type 1 DM:  Insulin Electrolytes stable today.  OT/PT consult for discharge planning Expect another 1-2 days of hospital stay   Quintin Alto, MD Pager: [phone] Office: [phone]

## 2020-12-29 NOTE — Evaluation (Signed)
Occupational Therapy Evaluation Patient Details Name: Colleen [name] MRN: [id number] DOB: 09/18/1966 Today's Date: 12/29/2020    History of Present Illness Pt is a 55 y/o female admitted 3/27 secondary to nausea/vomiting. Found to have complete heart block and STEMI and was taken to have temporary pacer placed on 3/27. Pacer leads removed on 4/4. Pt is also s/p heart cath on 4/1. PMHx of type-1 insulin-dependent DM, HTN, CAD, CKDIII, left ankle arthrodesis 2/2 Charcot joint.   Clinical Impression   Pt PTA:  Pt living at home with family independently. Pt currently, performing transfers, ADL functional mobility in room with RW, sitting EOB for LB dressing and standing for grooming at sink with no physical assist. Pt education on energy conservation techniques. Pt modified independence with mobility and ADL. Pt's O2 >90% on RA: HR 70s-80s at rest and with exertion. Pt does not require continued OT skilled services. OT signing off.    Follow Up Recommendations  No OT follow up    Equipment Recommendations  None recommended by OT    Recommendations for Other Services       Precautions / Restrictions Precautions Precautions: Fall Restrictions Weight Bearing Restrictions: No      Mobility Bed Mobility Overal bed mobility: Needs Assistance Bed Mobility: Supine to Sit;Sit to Supine     Supine to sit: Modified independent (Device/Increase time) Sit to supine: Modified independent (Device/Increase time)   General bed mobility comments: No physical assist; HR 84 BPM.    Transfers Overall transfer level: Needs assistance Equipment used: Rolling walker (2 wheeled) Transfers: Sit to/from Stand Sit to Stand: Modified independent (Device/Increase time)         General transfer comment: HR 84 BPM with exertion; no assist; use of RW    Balance Overall balance assessment: Needs assistance Sitting-balance support: No upper extremity supported;Feet supported Sitting balance-Leahy  Scale: Fair     Standing balance support: Bilateral upper extremity supported Standing balance-Leahy Scale: Poor Standing balance comment: use of RW for stability; slightly unsteady without it                           ADL either performed or assessed with clinical judgement   ADL Overall ADL's : Modified independent                                       General ADL Comments: Pt performing transfers, ADL functional mobility in room with RW, sitting EOB for LB dressing and standing for grooming at sink with no physical assist. Pt education on energy conservation techniques and pt recalled from PT session to set chairs around the house at home to sit in.     Vision Baseline Vision/History: Wears glasses Wears Glasses: At all times Patient Visual Report: No change from baseline Vision Assessment?: No apparent visual deficits     Perception     Praxis      Pertinent Vitals/Pain Pain Assessment: No/denies pain     Hand Dominance Right   Extremity/Trunk Assessment Upper Extremity Assessment Upper Extremity Assessment: Overall WFL for tasks assessed   Lower Extremity Assessment Lower Extremity Assessment: Generalized weakness   Cervical / Trunk Assessment Cervical / Trunk Assessment: Normal   Communication Communication Communication: No difficulties   Cognition Arousal/Alertness: Awake/alert Behavior During Therapy: WFL for tasks assessed/performed Overall Cognitive Status: Within Functional Limits for tasks assessed  General Comments  O2 >90% on RA: HR 70s-80s at rest and with exertion.    Exercises     Shoulder Instructions      Home Living Family/patient expects to be discharged to:: Private residence Living Arrangements: Spouse/significant other Available Help at Discharge: Family Type of Home: House Home Access: Level entry     Home Layout: Other (Comment) (sunken den with L  rail going up)     Bathroom Shower/Tub: Occupational psychologist: Handicapped height     Home Equipment: Shower seat - built in;Grab bars - tub/shower;Cane - single point          Prior Functioning/Environment Level of Independence: Independent with assistive device(s)        Comments: Occasionally uses cane for mobility for balance. Pt reports recently retired and on disability due to Charcot and current illnesses.        OT Problem List: Decreased activity tolerance      OT Treatment/Interventions:      OT Goals(Current goals can be found in the care plan section) Acute Rehab OT Goals Patient Stated Goal: to feel better OT Goal Formulation: All assessment and education complete, DC therapy Potential to Achieve Goals: Good  OT Frequency:     Barriers to D/C:            Co-evaluation              AM-PAC OT "6 Clicks" Daily Activity     Outcome Measure Help from another person eating meals?: None Help from another person taking care of personal grooming?: None Help from another person toileting, which includes using toliet, bedpan, or urinal?: None Help from another person bathing (including washing, rinsing, drying)?: None Help from another person to put on and taking off regular upper body clothing?: None Help from another person to put on and taking off regular lower body clothing?: None 6 Click Score: 24   End of Session Equipment Utilized During Treatment: Gait belt;Rolling walker Nurse Communication: Mobility status  Activity Tolerance: Patient tolerated treatment well Patient left: in bed;with call bell/phone within reach  OT Visit Diagnosis: Other abnormalities of gait and mobility (R26.89)                Time: 1344-1415 OT Time Calculation (min): 31 min Charges:  OT General Charges $OT Visit: 1 Visit OT Evaluation $OT Eval Moderate Complexity: 1 Mod OT Treatments $Self Care/Home Management : 8-22 mins  Jefferey Pica, OTR/L Acute  Rehabilitation Services Pager: [phone] Office: [place] 12/29/2020, 4:20 PM

## 2020-12-29 NOTE — Progress Notes (Addendum)
CARDIAC REHAB PHASE I   Offered to walk with pt. Pt states recent ambulation with OT. Feeling some stronger every day. Reviewed MI education. Questions and concerns addressed. Encouraged continued ambulation and IS use. Will refer to CRP II [place]. Will continue to follow.  [id number] Rufina Falco, RN BSN 12/29/2020 2:55 PM

## 2020-12-30 ENCOUNTER — Other Ambulatory Visit (HOSPITAL_COMMUNITY): Payer: Self-pay

## 2020-12-30 ENCOUNTER — Inpatient Hospital Stay (HOSPITAL_COMMUNITY): Payer: 59

## 2020-12-30 ENCOUNTER — Other Ambulatory Visit: Payer: Self-pay | Admitting: Cardiology

## 2020-12-30 DIAGNOSIS — I10 Essential (primary) hypertension: Secondary | ICD-10-CM

## 2020-12-30 LAB — BASIC METABOLIC PANEL
Anion gap: 6 (ref 5–15)
BUN: 27 mg/dL — ABNORMAL HIGH (ref 6–20)
CO2: 28 mmol/L (ref 22–32)
Calcium: 8.4 mg/dL — ABNORMAL LOW (ref 8.9–10.3)
Chloride: 100 mmol/L (ref 98–111)
Creatinine, Ser: 1.5 mg/dL — ABNORMAL HIGH (ref 0.44–1.00)
GFR, Estimated: 41 mL/min — ABNORMAL LOW (ref >60)
Glucose, Bld: 53 mg/dL — ABNORMAL LOW (ref 70–99)
Potassium: 3.8 mmol/L (ref 3.5–5.1)
Sodium: 134 mmol/L — ABNORMAL LOW (ref 135–145)

## 2020-12-30 LAB — GLUCOSE, CAPILLARY
Glucose-Capillary: 60 mg/dL — ABNORMAL LOW (ref 70–99)
Glucose-Capillary: 91 mg/dL (ref 70–99)
Glucose-Capillary: 54 mg/dL — ABNORMAL LOW (ref 70–99)
Glucose-Capillary: 73 mg/dL (ref 70–99)
Glucose-Capillary: 194 mg/dL — ABNORMAL HIGH (ref 70–99)

## 2020-12-30 LAB — MAGNESIUM: Magnesium: 1.9 mg/dL (ref 1.7–2.4)

## 2020-12-30 MED ORDER — TORSEMIDE 20 MG PO TABS
20.0000 mg | ORAL_TABLET | ORAL | 1 refills | Status: DC
  Filled 2020-12-30: qty 60, 30d supply, fill #0

## 2020-12-30 MED ORDER — SACUBITRIL-VALSARTAN 24-26 MG PO TABS
1.0000 | ORAL_TABLET | Freq: Two times a day (BID) | ORAL | 1 refills | Status: DC
  Filled 2020-12-30: qty 60, 30d supply, fill #0

## 2020-12-30 MED ORDER — TORSEMIDE 20 MG PO TABS
20.0000 mg | ORAL_TABLET | Freq: Once | ORAL | Status: AC
  Administered 2020-12-30: 20 mg via ORAL
  Filled 2020-12-30: qty 1

## 2020-12-30 MED ORDER — SACUBITRIL-VALSARTAN 24-26 MG PO TABS
1.0000 | ORAL_TABLET | Freq: Two times a day (BID) | ORAL | Status: DC
  Administered 2020-12-30: 1 via ORAL
  Filled 2020-12-30: qty 1

## 2020-12-30 NOTE — TOC Benefit Eligibility Note (Signed)
Patient Teacher, English as a foreign language completed.    The patient is currently admitted and upon discharge could be taking Entresto 24-26mg .  The current 30 day co-pay is, $50.00.   The patient is insured through [place] Patient Advocate Specialist [place] Team Direct Number: [phone]  Fax: [phone]

## 2020-12-30 NOTE — Plan of Care (Signed)

## 2020-12-30 NOTE — Discharge Summary (Addendum)
Physician Discharge Summary  Patient ID: JENNYFER NICKOLSON MRN: [id number] DOB/AGE: 57-May-1967 57 y.o.  Admit date: 12/19/2020 Discharge date: 12/30/2020  Primary Discharge Diagnosis: STEMI Complete AV block Right heart failure Acute kidney injury Elevated liver enzymes Paroxysmal Afib  Secondary Discharge Diagnosis: Hypertension Type 1 diabetes melitus CKD stage IIIb  HPI (12/19/2020): 55 y.o. Caucasian female  , pharmacist by profession, with type 1 IDDM-on insulin pump, mild CAD, presented to this long hospital today with 3 days of nausea vomiting, and a syncopal episode today.  EKG showed inferior ST elevation, along with sinus rhythm.  Initially, I was contacted for complete AV block.  However, given ST depression, there was concern for inferior STEMI causing complete AV block.  I thus activated Cath Lab for urgent coronary angiogram with or without temporary pacemaker placement.    Patient has been having severe nausea, vomiting, diarrhea since Friday morning. She has not been able to keep anything solid down. On Friday, she thinks she briefly lost consciousness and fell. CT head today showed no significant injury.    She initially denied any chest pain, but later confirms she has been having off and on chest pain on deep inhalation since Friday.   Last known Cr was in 09/2019, 1.3.  LFT's were normal  Hospital Course:  Further work-up showed acute coronary syndrome with inferior STEMI, likely late presentation given initial troponin of 75,000.  Creatinine was elevated at 4.3, subsequently increased to up to 5.3.  Given her late presentation and severe acute kidney injury, unfortunately, coronary angiogram could not be performed immediately.  Patient was in complete AV block requiring temporary pacemaker placement.  Complete AV block was ischemic in etiology.  Of the neck several days, patient was monitored closely in the ICU.  Complete AV block slowly recovered, initially 2:1 AV  block, then Mobitz 1 AV block, and then 1: 1 AV conduction.  EP followed throughout patient's hospital stay.  Fortunately, patient did not need pacemaker.  Patient had brief A. fib flutter, necessitating Eliquis use at least for 4 weeks.  She was also started on aspirin and Brilinta, later switched to Plavix given ongoing use of Eliquis.  Statin was resumed after improvement in liver enzymes.  Creatinine peaked at 5.3, and came down to 1.38.  Subsequent coronary angiogram showed ostial RCA occlusion, likely culprit vessel, as well as CTO of mid left circumflex, and moderate mid LAD disease.  Decision was made to perform stress perfusion MRI with gadolinium contrast on an outpatient basis after recovery from recent MI.  RV failure was treated with IV Lasix 40 mg.  She was started on Entresto 24-26 mg twice daily on discharge, along with torsemide 20 mg daily with additional dose in the afternoon as needed for leg edema.  Type 1 diabetes was managed with insulin.  Patient had partial skin tear at the site of dressing which was managed with wound consult.  Physical therapy was recommended after discharge.   Discharge Exam: Blood pressure 136/62, pulse 73, temperature 98.4 F (36.9 C), temperature source Oral, resp. rate 20, height 5\' 3"  (1.6 m), weight 97.1 kg, SpO2 99 %.   Physical Exam Vitals and nursing note reviewed.  Constitutional:      General: She is not in acute distress.    Appearance: She is well-developed.  HENT:     Head: Normocephalic and atraumatic.  Eyes:     Conjunctiva/sclera: Conjunctivae normal.     Pupils: Pupils are equal, round, and reactive to light.  Neck:     Vascular: No JVD.  Cardiovascular:     Rate and Rhythm: Normal rate and regular rhythm.     Pulses: Normal pulses and intact distal pulses.     Heart sounds: No murmur heard.   Pulmonary:     Effort: Pulmonary effort is normal.     Breath sounds: Normal breath sounds. No wheezing or rales.  Abdominal:      General: Bowel sounds are normal.     Palpations: Abdomen is soft.     Tenderness: There is no rebound.  Musculoskeletal:        General: No tenderness. Normal range of motion.     Right lower leg: No edema.     Left lower leg: No edema.  Lymphadenopathy:     Cervical: No cervical adenopathy.  Skin:    General: Skin is warm and dry.     Comments: Partial skin tear right upper chest, dressed with foam dressing and Xeroform.  Neurological:     Mental Status: She is alert and oriented to person, place, and time.     Cranial Nerves: No cranial nerve deficit.      Recommendations on discharge:  Daily change of Xeroform, and every 3-day change of foam dressing over right upper chest.   Significant Diagnostic Studies:  EKG 12/30/2020:  Sinus rhythm 69 bpm.  First-degree AV block.   Inferior infarct, age indeterminate  Coronary angiography 12/24/2020: LM: Normal LAD: Prox mod calcific 40% stenosis (Unchanges compared to cath 2018) LCx: Mid 100% occlusion, likely chronic. Reconstitution through retrograde filling from OM2 (New since cath 2018) RCA: Ostial 100% occlusion, likely the culprit for her STEMI with late presentation         Retrograde filling from LAD up to distal RCA (New since cath 2018)  RAL 12 mmHg LVEDP: 10 mmHg   EKG 12/21/2020: Sinus rhythm with 2:1 AV block Ventricular rate 42 bpm Inferior infarct, likely recent  EKG 12/20/2020: Sinus rhythm with complete AV block.   Asystole without ventricular escape rhythm. Two ventricular pacing complexes seen.  Echocardiogram 12/19/2020: 1. Left ventricular ejection fraction, by estimation, is 55 to 60%. The  left ventricle has normal function. The left ventricle demonstrates  regional wall motion abnormalities (see scoring diagram/findings for  description). Left ventricular diastolic  parameters are indeterminate. There is mild hypokinesis of the left  ventricular, basal inferolateral wall.  2. Right ventricular  systolic function is moderately reduced. The right  ventricular size is moderately enlarged. Estimated PASP 33 mmHg.  3. Right atrial size was mildly dilated.  4. Tricuspid valve regurgitation is severe.  5. The inferior vena cava is normal in size with <50% respiratory  variability, suggesting right atrial pressure of 8 mmHg.  6. Comapared to previous study report in 2018, LV/RV wall motion  abnormalities are new.   Labs:   Lab Results  Component Value Date   WBC 5.3 12/29/2020   HGB 9.2 (L) 12/29/2020   HCT 28.1 (L) 12/29/2020   MCV 91.8 12/29/2020   PLT 103 (L) 12/29/2020    Recent Labs  Lab 12/27/20 0941 12/28/20 0416 12/30/20 0415  NA  --    < > 134*  K  --    < > 3.8  CL  --    < > 100  CO2  --    < > 28  BUN  --    < > 27*  CREATININE  --    < > 1.50*  CALCIUM  --    < >  8.4*  PROT 5.7*  --   --   BILITOT 1.9*  --   --   ALKPHOS 152*  --   --   ALT 343*  --   --   AST 61*  --   --   GLUCOSE  --    < > 53*   < > = values in this interval not displayed.    Lipid Panel     Component Value Date/Time   CHOL 120 12/20/2020 0415   CHOL 164 10/08/2019 0901   TRIG 96 12/20/2020 0415   HDL 59 12/20/2020 0415   HDL 99 10/08/2019 0901   CHOLHDL 2.0 12/20/2020 0415   VLDL 19 12/20/2020 0415   LDLCALC 42 12/20/2020 0415   LDLCALC 54 10/08/2019 0901    BNP (last 3 results) Recent Labs    12/21/20 1012  BNP 1,323.7*    HEMOGLOBIN A1C Lab Results  Component Value Date   HGBA1C 7.8 (H) 12/19/2020   MPG 177.16 12/19/2020    Cardiac Panel (last 3 results) No results for input(s): CKTOTAL, CKMB, TROPONINI, RELINDX in the last 8760 hours.  Lab Results  Component Value Date   TROPONINI 0.03 (HH) 03/22/2017     TSH Recent Labs    12/21/20 1012  TSH 1.862    Radiology: DG Chest 1 View  Result Date: 12/20/2020 CLINICAL DATA:  Transvenous pacemaker placement EXAM: CHEST  1 VIEW COMPARISON:  09/25/2011 FINDINGS: Inferiorly approaching trans venous  pacemaker lead is seen overlying the expected right ventricle. Lung volumes are small, but are symmetric and are clear. Right internal jugular central venous catheter tip overlies the expected innominate vein. No pneumothorax or pleural effusion. Cardiac size is within normal limits. Pulmonary vascularity is normal. No acute bone abnormality. IMPRESSION: Inferiorly approaching trans venous pacemaker lead overlies the expected right ventricle. Electronically Signed   By: Fidela Salisbury MD   On: 12/20/2020 00:03   DG Chest 1 View  Result Date: 12/19/2020 CLINICAL DATA:  Encounter for central line placement. Verification of temporary pacing lead location. EXAM: CHEST  1 VIEW COMPARISON:  Radiograph earlier today. FINDINGS: Single fluoroscopic spot view of the lower chest is obtained. The right internal jugular central catheter on prior radiograph is not seen on this view. There multiple overlying monitoring devices. Presumed pacing lead from inferior projects over the expected location of the central/right heart. IMPRESSION: Single fluoroscopic spot view of the lower chest. Pacing lead is tentatively visualized projecting over the central/right heart. Electronically Signed   By: Keith Rake M.D.   On: 12/19/2020 22:07   CT Head Wo Contrast  Result Date: 12/19/2020 CLINICAL DATA:  Confusion.  Recent fall EXAM: CT HEAD WITHOUT CONTRAST TECHNIQUE: Contiguous axial images were obtained from the base of the skull through the vertex without intravenous contrast. COMPARISON:  03/22/2017 FINDINGS: Brain: No evidence of infarction, hemorrhage, hydrocephalus, extra-axial collection or mass lesion/mass effect. Vascular: Negative Skull: Negative for fracture Sinuses/Orbits: Bilateral cataract resection. Other: These results were called by telephone at the time of interpretation on 12/19/2020 at 8:31 am to provider MELANIE BELFI , who verbally acknowledged these results. IMPRESSION: Normal appearance of the brain.  Electronically Signed   By: Monte Fantasia M.D.   On: 12/19/2020 08:31   CARDIAC CATHETERIZATION  Result Date: 10/27/252  LV end diastolic pressure is normal.  LM: Normal LAD: Prox mod calcific 40% stenosis (Unchanges compared to cath 2018) LCx: Mid 100% occlusion, likely chronic. Reconstitution through retrograde filling from OM2 (New since cath  2018) RCA: Ostial 100% occlusion, likely the culprit for her STEMI with late presentation         Retrograde filling from LAD up to distal RCA (New since cath 2018) RAL 12 mmHg LVEDP: 10 mmHg I attempted wiring LCx to see if this lesion was CTO Will discuss ischemia guided revascularization in future for LCx +/- LAD. Nigel Mormon, MD Pager: [phone] Office: [phone]  CARDIAC CATHETERIZATION  Addendum Date: 12/19/2020   Successful temporary pacemaker placement. Nigel Mormon, MD Pager: [phone] Office: [phone]   Result Date: 12/19/2020 Successful temporary pacemaker placement. Nigel Mormon, MD Pager: [phone] Office: [phone]   CARDIAC CATHETERIZATION  Result Date: 12/19/2020 Successful temporary pacemaker placement Nigel Mormon, MD Pager: [phone] Office: [phone]   US RENAL  Result Date: 12/19/2020 CLINICAL DATA:  55 year old female with acute kidney injury. EXAM: RENAL / URINARY TRACT ULTRASOUND COMPLETE COMPARISON:  10/15/2016 MR FINDINGS: Right Kidney: Renal measurements: 8.9 x 4.3 x 5.5 cm = volume: 109 mL. Echogenicity within normal limits. No solid mass or hydronephrosis visualized. A 2 cm cyst is noted. Left Kidney: Renal measurements: 9.8 x 4.7 x 4.7 cm = volume: 112 mL. Echogenicity within normal limits. No solid mass or hydronephrosis visualized. Two cysts are noted. Bladder: Appears normal for degree of bladder distention. Other: None. IMPRESSION: Unremarkable renal ultrasound except for scattered renal cysts. No hydronephrosis. Electronically Signed   By: Margarette Canada M.D.   On:  12/19/2020 14:41   DG CHEST PORT 1 VIEW  Result Date: 12/30/2020 CLINICAL DATA:  Fever. EXAM: PORTABLE CHEST 1 VIEW COMPARISON:  Chest x-ray 12/23/2020. FINDINGS: Mediastinum hilar structures normal. Borderline cardiomegaly again noted. No pulmonary venous congestion. No focal infiltrate. Mild right base subsegmental atelectasis. No pleural effusion noted on today's exam. No pneumothorax. IMPRESSION: 1. Borderline cardiomegaly again noted. No pulmonary venous congestion. 2. No focal infiltrate. Mild right base subsegmental atelectasis. No pleural effusion noted on today's exam. Electronically Signed   By: Marcello Moores  Register   On: 12/30/2020 05:57   DG CHEST PORT 1 VIEW  Result Date: 12/23/2020 CLINICAL DATA:  Chest pain. EXAM: PORTABLE CHEST 1 VIEW COMPARISON:  12/20/2020. FINDINGS: Cardiomegaly. No pulmonary venous congestion. Low lung volumes with mild bibasilar atelectasis. Tiny bilateral pleural effusions can not be excluded. No pneumothorax. IMPRESSION: 1.  Cardiomegaly.  No pulmonary venous congestion. 2. Low lung volumes with mild bibasilar atelectasis. Tiny bilateral pleural effusions cannot be excluded. Electronically Signed   By: Marcello Moores  Register   On: 12/23/2020 12:36   DG CHEST PORT 1 VIEW  Result Date: 12/20/2020 CLINICAL DATA:  Temporary pacemaker. EXAM: PORTABLE CHEST 1 VIEW COMPARISON:  12/19/2020. FINDINGS: Trachea is midline. Heart is enlarged, stable. Thoracic aorta is calcified. Right IJ central line tip is in the brachiocephalic vein junction region. Transvenous pacemaker is seen from an IVC approach with tip projecting over the right ventricle. Defect related pads are in place. Lungs are somewhat low in volume with minimal bibasilar subsegmental atelectasis. No pleural fluid. IMPRESSION: Low lung volumes with bibasilar subsegmental atelectasis. Electronically Signed   By: Lorin Picket M.D.   On: 12/20/2020 10:00   DG Chest Port 1 View  Result Date: 12/19/2020 CLINICAL DATA:   Temporary cardiac pacemaker placement EXAM: PORTABLE CHEST 1 VIEW COMPARISON:  08/26/2015 FINDINGS: UPPER limits normal heart size again noted. A lead extending from the abdomen is noted with tip overlying the mid-LEFT heart. A RIGHT IJ central venous catheter sheath is present with tip overlying the mid SVC. There is  no evidence of focal airspace disease, pulmonary edema, suspicious pulmonary nodule/mass, pleural effusion, or pneumothorax. No acute bony abnormalities are identified. IMPRESSION: Lead extending from the abdomen overlying the mid-LEFT heart. No pneumothorax. RIGHT IJ central venous catheter sheath. Electronically Signed   By: Margarette Canada M.D.   On: 12/19/2020 11:33   DG C-Arm 1-60 Min  Result Date: 12/19/2020 CLINICAL DATA:  Verification of temporary pacing lead location. EXAM: DG C-ARM 1-60 MIN FLUOROSCOPY TIME:  Fluoroscopy Time:  179 seconds Radiation Exposure Index (if provided by the fluoroscopic device): 41.45 mGy Number of Acquired Spot Images: 1 COMPARISON:  Radiograph earlier this day. FINDINGS: Single fluoroscopic spot view of the lower chest is obtained. The right internal jugular central catheter on prior radiograph is not seen on this view. There multiple overlying monitoring devices. Presumed pacing lead from inferior projects over the expected location of the central/right heart. IMPRESSION: Single fluoroscopic spot view of the lower chest. Pacing lead is tentatively visualized projecting over the central/right heart. Electronically Signed   By: Keith Rake M.D.   On: 12/19/2020 22:15   ECHOCARDIOGRAM COMPLETE  Result Date: 12/19/2020    ECHOCARDIOGRAM LIMITED REPORT   Patient Name:   RENE GONSOULIN Stclair Date of Exam: 12/19/2020 Medical Rec #:  [id number]        Height:       63.0 in Accession #:    [id number]       Weight:       205.0 lb Date of Birth:  May 07, 1966        BSA:          1.954 m Patient Age:    82 years         BP:           116/76 mmHg Patient Gender: F                 HR:           60 bpm. Exam Location:  Inpatient Procedure: 2D Echo and Intracardiac Opacification Agent Indications:     abnormal ecg  History:         Patient has no prior history of Echocardiogram examinations.                  Arrythmias:complete AV block; Risk Factors:Diabetes and                  Hypertension.  Sonographer:     Johny Chess RDCS Referring Phys:  [id number] [facility] J Izaak Sahr Diagnosing Phys: Vernell Leep MD IMPRESSIONS  1. Left ventricular ejection fraction, by estimation, is 55 to 60%. The left ventricle has normal function. The left ventricle demonstrates regional wall motion abnormalities (see scoring diagram/findings for description). Left ventricular diastolic parameters are indeterminate. There is mild hypokinesis of the left ventricular, basal inferolateral wall.  2. Right ventricular systolic function is moderately reduced. The right ventricular size is moderately enlarged. Estimated PASP 33 mmHg.  3. Right atrial size was mildly dilated.  4. Tricuspid valve regurgitation is severe.  5. The inferior vena cava is normal in size with <50% respiratory variability, suggesting right atrial pressure of 8 mmHg.  6. Comapared to previous study report in 2018, LV/RV wall motion abnormalities are new. FINDINGS  Left Ventricle: Left ventricular ejection fraction, by estimation, is 55 to 60%. The left ventricle has normal function. The left ventricle demonstrates regional wall motion abnormalities. Mild hypokinesis of the left ventricular, basal inferolateral wall. Definity contrast agent was given IV  to delineate the left ventricular endocardial borders. Abnormal (paradoxical) septal motion, consistent with RV pacemaker. Left ventricular diastolic parameters are indeterminate. Right Ventricle: The right ventricular size is moderately enlarged. No increase in right ventricular wall thickness. Right ventricular systolic function is moderately reduced. There is moderately elevated  pulmonary artery systolic pressure. The tricuspid  regurgitant velocity is 2.39 m/s, and with an assumed right atrial pressure of 8 mmHg, the estimated right ventricular systolic pressure is 28.3 mmHg. Left Atrium: Left atrial size was normal in size. Right Atrium: Right atrial size was mildly dilated. Pericardium: There is no evidence of pericardial effusion. Mitral Valve: The mitral valve is grossly normal. Tricuspid Valve: The tricuspid valve is grossly normal. Tricuspid valve regurgitation is severe. Aortic Valve: The aortic valve is grossly normal. Aortic valve regurgitation is not visualized. Pulmonic Valve: The pulmonic valve was grossly normal. Pulmonic valve regurgitation is not visualized. Aorta: The aortic root and ascending aorta are structurally normal, with no evidence of dilitation. Venous: The inferior vena cava is normal in size with less than 50% respiratory variability, suggesting right atrial pressure of 8 mmHg. Additional Comments: A device lead is visualized in the inferior vena cava. LEFT VENTRICLE PLAX 2D LVIDd:         4.40 cm LVIDs:         3.50 cm LV PW:         1.00 cm LV IVS:        1.00 cm LVOT diam:     1.60 cm LVOT Area:     2.01 cm  RIGHT VENTRICLE         IVC TAPSE (M-mode): 1.2 cm  IVC diam: 2.10 cm LEFT ATRIUM             Index       RIGHT ATRIUM           Index LA diam:        2.70 cm 1.38 cm/m  RA Area:     16.40 cm LA Vol (A2C):   38.6 ml 19.75 ml/m RA Volume:   47.30 ml  24.21 ml/m LA Vol (A4C):   37.5 ml 19.19 ml/m LA Biplane Vol: 40.2 ml 20.57 ml/m   AORTA Ao Root diam: 3.10 cm Ao Asc diam:  3.30 cm TRICUSPID VALVE TR Peak grad:   22.8 mmHg TR Vmax:        239.00 cm/s  SHUNTS Systemic Diam: 1.60 cm Vernell Leep MD Electronically signed by Vernell Leep MD Signature Date/Time: 12/19/2020/4:19:20 PM    Final    ECHOCARDIOGRAM LIMITED  Result Date: 12/23/2020    ECHOCARDIOGRAM LIMITED REPORT   Patient Name:   BENICIA BERGEVIN Zeiter Date of Exam: 12/23/2020 Medical  Rec #:  [id number]        Height:       63.0 in Accession #:    [id number]       Weight:       216.0 lb Date of Birth:  [birth year]        BSA:          1.998 m Patient Age:    11 years         BP:           109/62 mmHg Patient Gender: F                HR:           50 bpm. Exam Location:  Inpatient Procedure: Limited Echo, Limited Color Doppler,  Cardiac Doppler and Intracardiac            Opacification Agent Indications:    Acute myocardial infarction, unspecified I21.9  History:        Patient has prior history of Echocardiogram examinations, most                 recent 12/19/2020. Risk Factors:Diabetes.  Sonographer:    Bernadene Person RDCS Referring Phys: [id number] [facility]  1. Abnormal septal wall motion due to right ventricular pacing. Left ventricular ejection fraction, by estimation, is 50 to 55%. The left ventricle has low normal function.  2. Right ventricular systolic function is moderately reduced. The right ventricular size is moderately enlarged. There is normal pulmonary artery systolic pressure. Estimated PASP 21 mmHg.  3. Pacemaker lead seen in right ventricle.  4. Tricuspid valve regurgitation is severe.  5. The inferior vena cava is normal in size with <50% respiratory variability, suggesting right atrial pressure of 8 mmHg.  6. Compared to previous study on 12/19/2020, RV pacing is new. FINDINGS  Left Ventricle: Abnormal septal wall motion due to right ventricular pacing. Left ventricular ejection fraction, by estimation, is 50 to 55%. The left ventricle has low normal function. Definity contrast agent was given IV to delineate the left ventricular endocardial borders. Right Ventricle: The right ventricular size is moderately enlarged. Right vetricular wall thickness was not assessed. Right ventricular systolic function is moderately reduced. There is normal pulmonary artery systolic pressure. The tricuspid regurgitant  velocity is 2.10 m/s, and with an assumed right atrial  pressure of 8 mmHg, the estimated right ventricular systolic pressure is 62.7 mmHg. Pericardium: There is no evidence of pericardial effusion. Tricuspid Valve: Tricuspid valve regurgitation is severe. Venous: The inferior vena cava is normal in size with less than 50% respiratory variability, suggesting right atrial pressure of 8 mmHg. LEFT VENTRICLE PLAX 2D LVIDd:         4.30 cm LVIDs:         3.20 cm LV PW:         1.00 cm LV IVS:        0.90 cm LVOT diam:     1.90 cm LV SV:         34 LV SV Index:   17 LVOT Area:     2.84 cm  RIGHT VENTRICLE TAPSE (M-mode): 1.2 cm LEFT ATRIUM         Index LA diam:    3.00 cm 1.50 cm/m  AORTIC VALVE LVOT Vmax:   70.50 cm/s LVOT Vmean:  48.700 cm/s LVOT VTI:    0.122 m  AORTA Ao Root diam: 3.20 cm TRICUSPID VALVE TR Peak grad:   17.6 mmHg TR Vmax:        210.00 cm/s  SHUNTS Systemic VTI:  0.12 m Systemic Diam: 1.90 cm Marcellina Jonsson MD Electronically signed by Vernell Leep MD Signature Date/Time: 12/23/2020/5:01:33 PM    Final       FOLLOW UP PLANS AND APPOINTMENTS Discharge Instructions    (HEART FAILURE PATIENTS) Call MD:  Anytime you have any of the following symptoms: 1) 3 pound weight gain in 24 hours or 5 pounds in 1 week 2) shortness of breath, with or without a dry hacking cough 3) swelling in the hands, feet or stomach 4) if you have to sleep on extra pillows at night in order to breathe.   Complete by: As directed    Amb Referral to Cardiac Rehabilitation   Complete by:  As directed    Diagnosis: STEMI   After initial evaluation and assessments completed: Virtual Based Care may be provided alone or in conjunction with Phase 2 Cardiac Rehab based on patient barriers.: Yes   Call MD for:  redness, tenderness, or signs of infection (pain, swelling, redness, odor or green/yellow discharge around incision site)   Complete by: As directed    Call MD for:  temperature >100.4   Complete by: As directed    Diet - low sodium heart healthy   Complete by:  As directed    Discharge wound care:   Complete by: As directed    Dressing procedure/placement/frequency: Gently cleanse right clavicular and right upper thigh skin tear areas with saline. Pat dry. Place a piece of Xeroform over the areas, then a foam dressing. The foam dressing can remain in place up to 3 days. The Xeroform must be changed daily   Increase activity slowly   Complete by: As directed      Allergies as of 12/30/2020      Reactions   Other Other (See Comments)   Succinylcholine Other (See Comments)   Pseudocholinesterase deficiency      Medication List    STOP taking these medications   amLODipine 5 MG tablet Commonly known as: NORVASC   furosemide 40 MG tablet Commonly known as: LASIX   lisinopril 5 MG tablet Commonly known as: ZESTRIL   metoprolol succinate 50 MG 24 hr tablet Commonly known as: TOPROL-XL     TAKE these medications   albuterol 108 (90 Base) MCG/ACT inhaler Commonly known as: VENTOLIN HFA Inhale 1-2 puffs into the lungs every 6 (six) hours as needed for wheezing or shortness of breath.   Aspirin Low Dose 81 MG EC tablet Generic drug: aspirin Take 1 tablet (81 mg total) by mouth daily. Swallow whole.   clopidogrel 75 MG tablet Commonly known as: PLAVIX Take 1 tablet (75 mg total) by mouth daily.   Eliquis 5 MG Tabs tablet Generic drug: apixaban Take 1 tablet (5 mg total) by mouth 2 (two) times daily.   Entresto 24-26 MG Generic drug: sacubitril-valsartan Take 1 tablet by mouth 2 (two) times daily.   ferrous sulfate 325 (65 FE) MG tablet Take 325 mg by mouth daily with breakfast.   insulin lispro 100 UNIT/ML injection Commonly known as: HUMALOG Inject into the skin See admin instructions. Insulin Pump - Mini Medtronic 670G   loratadine 10 MG tablet Commonly known as: CLARITIN Take 1 tablet by mouth daily as needed.   nitroGLYCERIN 0.4 MG SL tablet Commonly known as: NITROSTAT Place 1 tablet (0.4 mg total) under the tongue  every 5 (five) minutes x 3 doses as needed for chest pain.   rosuvastatin 10 MG tablet Commonly known as: CRESTOR Take 10 mg by mouth at bedtime.   tobramycin 0.3 % ophthalmic solution Commonly known as: TOBREX Place 2 drops into both eyes as needed (prior to injections).   torsemide 20 MG tablet Commonly known as: DEMADEX Take 1 tablet (20 mg total) by mouth in the morning as directed. May take 1 additional tab in the afternoon if there is leg swelling (Take 1 tablet (20 mg total) by mouth as directed. One tab in the morning Additional 1 tab in the afternoon if there is leg swelling)   Vitamin D 50 MCG (2000 UT) tablet Take 2,000 Units by mouth daily.            Discharge Care Instructions  (From admission, onward)  Start     Ordered   12/30/20 0000  Discharge wound care:       Comments: Dressing procedure/placement/frequency: Gently cleanse right clavicular and right upper thigh skin tear areas with saline. Pat dry. Place a piece of Xeroform over the areas, then a foam dressing. The foam dressing can remain in place up to 3 days. The Xeroform must be changed daily   12/30/20 0735          Follow-up Information    Cledith Kamiya, Reynold Bowen, MD. Call on 01/05/2021.   Specialties: Cardiology, Radiology Why: 12:00 noon Contact information: [place] [address] [phone]        Orpah Melter, MD. Go on 01/04/2021.   Specialty: Family Medicine Why: @1 :30pm Contact information: [address] [place] [address] [place], [facility] Follow up.   Why: rolling walker Contact information: [place] [address] [phone]        [facility] Follow up.   Specialty: Physical Therapy Why: outpatient physical therapy, they will call you to schedule apt Contact information: [address] [facility] [place]  [address] [phone]              Discharge time: 18 min    Nigel Mormon, MD Pager: [phone] Office: [phone]

## 2020-12-30 NOTE — Progress Notes (Signed)
D/C instructions given and reviewed. Tele and IV removed, tolerated well. Awaiting DME and TOC meds delivery.

## 2020-12-30 NOTE — TOC Transition Note (Signed)
Transition of Care [facility]) - CM/SW Discharge Note   Patient Details  Name: Colleen [name] MRN: [id number] Date of Birth: [birth year]  Transition of Care [facility]) CM/SW Contact:  Zenon Mayo, RN Phone Number: 12/30/2020, 9:27 AM   Clinical Narrative:    She will have outpatient pt with [facility] outpatient physical therapy, rolling walker will be delivered to her room.  Spouse will transport her home today.    Final next level of care: Home/Self Care Barriers to Discharge: No Barriers Identified   Patient Goals and CMS Choice Patient states their goals for this hospitalization and ongoing recovery are:: return home with outpatient pt   Choice offered to / list presented to : NA  Discharge Placement                       Discharge Plan and Services   Discharge Planning Services: CM Consult Post Acute Care Choice: NA          DME Arranged: Walker rolling DME Agency: [employer] Date DME Agency Contacted: 12/30/20 Time DME Agency Contacted: [phone] Representative spoke with at DME Agency: [place]: NA          Social Determinants of Health ([place]) Interventions     Readmission Risk Interventions Readmission Risk Prevention Plan 12/30/2020  Transportation Screening Complete  PCP or Specialist Appt within 5-7 Days Complete  Home Care Screening Complete  Medication Review (RN CM) Complete  Some recent data might be hidden

## 2020-12-30 NOTE — TOC Initial Note (Signed)
Transition of Care (TOC) - Initial/Assessment Note  Patient is from home with spouse, PT rec HHPT, NCM spoke with patient , she states she has had outpatient pt before and she would like to do outpatient at [facility] outpatient physical therapy.  NCM gave patient script to take to outpatient pt and also faxed the script to [facility].  [phone].  She will also need rolling walker , NCM made referral to [facility] with [facility] for rolling walker.  This will be brought up to patient 's room prior to dc.   Patient Details  Name: Colleen [name] MRN: [id number] Date of Birth: [birth year]  Transition of Care [facility]) CM/SW Contact:    Zenon Mayo, RN Phone Number: 12/30/2020, 9:24 AM  Clinical Narrative:                   Expected Discharge Plan: [place] Barriers to Discharge: No Barriers Identified   Patient Goals and CMS Choice Patient states their goals for this hospitalization and ongoing recovery are:: returen home with outpatient pt   Choice offered to / list presented to : NA  Expected Discharge Plan and Services Expected Discharge Plan: [place]   Discharge Planning Services: CM Consult Post Acute Care Choice: NA Living arrangements for the past 2 months: Single Family Home Expected Discharge Date: 12/30/20               DME Arranged: Gilford Rile rolling DME Agency: [employer] Date DME Agency Contacted: 12/30/20 Time DME Agency Contacted: [phone] Representative spoke with at DME Agency: [place]: NA          Prior Living Arrangements/Services Living arrangements for the past 2 months: [place] Lives with:: Spouse Patient language and need for interpreter reviewed:: Yes Do you feel safe going back to the place where you live?: Yes      Need for Family Participation in Patient Care: Yes (Comment) Care giver support system in place?: Yes (comment)   Criminal Activity/Legal Involvement Pertinent to Current  Situation/Hospitalization: No - Comment as needed  Activities of Daily Living Home Assistive Devices/Equipment: None ADL Screening (condition at time of admission) Patient's cognitive ability adequate to safely complete daily activities?: Yes Is the patient deaf or have difficulty hearing?: No Does the patient have difficulty seeing, even when wearing glasses/contacts?: No Does the patient have difficulty concentrating, remembering, or making decisions?: No Patient able to express need for assistance with ADLs?: No Does the patient have difficulty dressing or bathing?: No Independently performs ADLs?: Yes (appropriate for developmental age) Does the patient have difficulty walking or climbing stairs?: No Weakness of Legs: None Weakness of Arms/Hands: None  Permission Sought/Granted                  Emotional Assessment Appearance:: Appears stated age Attitude/Demeanor/Rapport: Engaged Affect (typically observed): Appropriate Orientation: : Oriented to Self,Oriented to Place,Oriented to  Time,Oriented to Situation Alcohol / Substance Use: Not Applicable Psych Involvement: No (comment)  Admission diagnosis:  Syncope and collapse [R55] AV block [I44.30] Transaminitis [R74.01] AKI (acute kidney injury) ([place]) [N17.9] Nausea and vomiting, intractability of vomiting not specified, unspecified vomiting type [R11.2] Complete AV block ([place]) [I44.2] Patient Active Problem List   Diagnosis Date Noted  . Paroxysmal atrial flutter ([place]) 12/28/2020  . Paroxysmal atrial fibrillation ([place]) 12/28/2020  . ST elevation myocardial infarction involving right coronary artery ([place])   . Chest pain   . Heart block AV complete ([place])  12/19/2020  . AV block 12/19/2020  . Syncope and collapse   . Coronary artery disease 10/07/2019  . Essential hypertension 10/07/2019  . Exertional shortness of breath 05/08/2017  . Chronic venous insufficiency 01/19/2017  . Type II diabetes mellitus ([place])  01/17/2012  . Serum cholinesterase deficiency ([place]) 01/17/2012  . Premature menopause 01/17/2012  . GERD (gastroesophageal reflux disease)   . Asthma    PCP:  Orpah Melter, MD Pharmacy:   [id number] [place], [address] [place] [address] Phone: [phone] Fax: [phone]  [place], [address], [address] [place], [place] [address] Phone: [phone] Fax: [employer] [address] [place] [address] Phone: [phone] Fax: [phone]     Social Determinants of Health (SDOH) Interventions    Readmission Risk Interventions Readmission Risk Prevention Plan 12/30/2020  Transportation Screening Complete  PCP or Specialist Appt within 5-7 Days Complete  Home Care Screening Complete  Medication Review (RN CM) Complete  Some recent data might be hidden

## 2020-12-30 NOTE — Progress Notes (Signed)
Results for KELVIN, BURPEE (MRN [id number]) as of 12/30/2020 09:30  Ref. Range 12/30/2020 04:19 12/30/2020 05:23 12/30/2020 06:23 12/30/2020 06:55 12/30/2020 07:51  Glucose-Capillary Latest Ref Range: 70 - 99 mg/dL 60 (L) 91 54 (L) 73 194 (H)  Noted that blood sugars have been less than 70 mg/dl.  Recommend decreasing Levemir to 15 units BID if blood sugars continue to be low.   Harvel Ricks RN BSN CDE Diabetes Coordinator Pager: [phone]  8am-5pm

## 2020-12-31 ENCOUNTER — Telehealth: Payer: Self-pay

## 2020-12-31 NOTE — Telephone Encounter (Signed)
Location of hospitalization: [facility] Reason for hospitalization: Pt was dehydrated and was throwing up for 48 hours Date of discharge: Date of first communication with patient: today Person contacting patient: Me Current symptoms: Symptoms are resolved Do you understand why you were in the Hospital: Yes Questions regarding discharge instructions: None Where were you discharged to: Home Medications reviewed: Yes Allergies reviewed: Yes Dietary changes reviewed: Yes. Discussed low fat and low salt diet. Heart healhty diet Referals reviewed: NA Activities of Daily Living: Pt has rehab she has to do, her husband has to help her occasionally. Mild limitation with dyspnea.  Any transportation issues/concerns: None Any patient concerns: None  Confirmed importance & date/time of Follow up appt: Yes Confirmed with patient if condition begins to worsen call. Pt was given the office number and encouraged to call back with questions or concerns: Yes

## 2021-01-01 ENCOUNTER — Telehealth: Payer: Self-pay | Admitting: Student

## 2021-01-01 ENCOUNTER — Telehealth: Payer: Self-pay | Admitting: Cardiology

## 2021-01-01 DIAGNOSIS — I50812 Chronic right heart failure: Secondary | ICD-10-CM

## 2021-01-01 DIAGNOSIS — I2510 Atherosclerotic heart disease of native coronary artery without angina pectoris: Secondary | ICD-10-CM

## 2021-01-01 DIAGNOSIS — I10 Essential (primary) hypertension: Secondary | ICD-10-CM

## 2021-01-01 DIAGNOSIS — I2111 ST elevation (STEMI) myocardial infarction involving right coronary artery: Secondary | ICD-10-CM

## 2021-01-01 DIAGNOSIS — I442 Atrioventricular block, complete: Secondary | ICD-10-CM

## 2021-01-01 MED ORDER — SACUBITRIL-VALSARTAN 24-26 MG PO TABS: 0.5000 | ORAL_TABLET | Freq: Two times a day (BID) | ORAL | 1 refills | Status: DC

## 2021-01-01 NOTE — Telephone Encounter (Signed)
ON-CALL CARDIOLOGY 01/01/21  Patient's name: Colleen [name].   MRN: [id number].    DOB: Sep 06, 1966 Primary care provider: Orpah Melter, MD. Primary cardiologist: Dr. Einar Gip  Interaction regarding this patient's care today: Patient called concerned as she was feeling fatigued and checked her blood pressure and the reading on her home monitor was 60/49 mmHg with heart rate of 52 bpm. She denies chest pain, dizziness, lightheadedness, dyspnea, syncope, near-syncope. With the exception of fatigue patient is asymptomatic.   She reports after checking her blood pressure she drank some soda and upon recheck while on the phone, patient's blood pressure is 112/62 mmHg and heart rate 82 bpm.   Impression:   ICD-10-CM   1. Essential hypertension  I10   2. Atherosclerosis of native coronary artery of native heart without angina pectoris  I25.10   3. Chronic right-sided heart failure (HCC)  I50.812     Meds ordered this encounter  Medications  . sacubitril-valsartan (ENTRESTO) 24-26 MG    Sig: Take 0.5 tablets by mouth 2 (two) times daily.    Dispense:  60 tablet    Refill:  1    No orders of the defined types were placed in this encounter.   Recommendations: Advised patient to reduce Entresto, take 1/2 tablet twice daily, also advised her to skip her morning dose of Entresto tomorrow. Counseled patient regarding signs and symptoms that would warrant emergent evaluation, patient and her husband both verbalized understanding and agreement.  Telephone encounter total time: 12 minutes     Alethia Berthold, PA-C 01/01/2021, 8:50 AM Office: [phone]

## 2021-01-01 NOTE — Telephone Encounter (Signed)
ON-CALL CARDIOLOGY 01/01/21  Patient's name: Colleen [name].   MRN: [id number].    DOB: [birth year] Primary care provider: Orpah Melter, MD. Primary cardiologist: Dr. Vernell Leep  Interaction regarding this patient's care today: Patient called stating that she is gained 4 pounds of weight for the last 24 hours.  Patient was discharged home on Entresto 24/26 mg p.o. twice daily and torsemide to use on as needed basis.  The patient has taken a total of 4 doses of Entresto at 24/26 mg p.o. twice daily and states that she is noted a drop in blood pressures and ringing in her ears.  This morning her blood pressure was 60/41 and a heart rate varying between 40--52 bpm.  Due to the gaining weight and low blood pressures she called for further recommendations.  Current blood pressure is 104/81 with a pulse of 76 per the patient's husband  Reviewed her medication list.  Currently she has Entresto, torsemide, and furosemide at her disposal.  Symptomatically she denies shortness of breath, orthopnea, paroxysmal nocturnal dyspnea, no significant swelling.  Impression:   ICD-10-CM   1. ST elevation myocardial infarction involving right coronary artery (HCC)  I21.11   2. Heart block AV complete (HCC)  I44.2     Recommendations: Patient was not able to sleep well yesterday night.  And plans to go to sleep around 7:30 PM.  To prevent frequent waking up at night recommended taking Lasix 20 mg p.o. now as it has a shorter half-life.  We will hold off Entresto for tonight given the low blood pressures.  Tomorrow as long as a systolic blood pressure greater than 100 mmHg start Entresto 24/26 mg half a tablet twice daily as long as SBP >100 mmHg.  Use torsemide on a as needed basis.  She has a follow-up appointment with Dr. Virgina Jock later this week.  She was taken further recommendations.  A copy will be forwarded to Dr. Virgina Jock for reference.  Telephone encounter total time: 11  minutes  Mechele Claude [facility]  Pager: [phone] Office: [phone]

## 2021-01-02 ENCOUNTER — Inpatient Hospital Stay (HOSPITAL_COMMUNITY): Payer: 59

## 2021-01-02 ENCOUNTER — Other Ambulatory Visit: Payer: Self-pay

## 2021-01-02 ENCOUNTER — Encounter (HOSPITAL_COMMUNITY): Payer: Self-pay | Admitting: Emergency Medicine

## 2021-01-02 ENCOUNTER — Emergency Department (HOSPITAL_COMMUNITY): Payer: 59

## 2021-01-02 ENCOUNTER — Encounter (HOSPITAL_COMMUNITY)
Admission: EM | Disposition: A | Discharge status: Home / Self Care | Payer: Self-pay | Source: Home / Self Care | Attending: Critical Care Medicine

## 2021-01-02 ENCOUNTER — Inpatient Hospital Stay (HOSPITAL_COMMUNITY)
Admission: EM | Admit: 2021-01-02 | Discharge: 2021-01-10 | DRG: 228 | Disposition: A | Discharge status: Home / Self Care | Payer: 59 | Attending: Cardiology | Admitting: Cardiology

## 2021-01-02 DIAGNOSIS — Y838 Other surgical procedures as the cause of abnormal reaction of the patient, or of later complication, without mention of misadventure at the time of the procedure: Secondary | ICD-10-CM | POA: Diagnosis not present

## 2021-01-02 DIAGNOSIS — Z794 Long term (current) use of insulin: Secondary | ICD-10-CM

## 2021-01-02 DIAGNOSIS — I252 Old myocardial infarction: Secondary | ICD-10-CM

## 2021-01-02 DIAGNOSIS — I071 Rheumatic tricuspid insufficiency: Secondary | ICD-10-CM | POA: Diagnosis present

## 2021-01-02 DIAGNOSIS — R55 Syncope and collapse: Secondary | ICD-10-CM

## 2021-01-02 DIAGNOSIS — Z8249 Family history of ischemic heart disease and other diseases of the circulatory system: Secondary | ICD-10-CM

## 2021-01-02 DIAGNOSIS — N179 Acute kidney failure, unspecified: Secondary | ICD-10-CM

## 2021-01-02 DIAGNOSIS — D631 Anemia in chronic kidney disease: Secondary | ICD-10-CM | POA: Diagnosis present

## 2021-01-02 DIAGNOSIS — E1065 Type 1 diabetes mellitus with hyperglycemia: Secondary | ICD-10-CM | POA: Diagnosis present

## 2021-01-02 DIAGNOSIS — Z9641 Presence of insulin pump (external) (internal): Secondary | ICD-10-CM | POA: Diagnosis present

## 2021-01-02 DIAGNOSIS — Z950 Presence of cardiac pacemaker: Secondary | ICD-10-CM

## 2021-01-02 DIAGNOSIS — I10 Essential (primary) hypertension: Secondary | ICD-10-CM | POA: Diagnosis present

## 2021-01-02 DIAGNOSIS — I25118 Atherosclerotic heart disease of native coronary artery with other forms of angina pectoris: Secondary | ICD-10-CM | POA: Diagnosis present

## 2021-01-02 DIAGNOSIS — M545 Low back pain, unspecified: Secondary | ICD-10-CM | POA: Diagnosis present

## 2021-01-02 DIAGNOSIS — I21A1 Myocardial infarction type 2: Secondary | ICD-10-CM | POA: Diagnosis not present

## 2021-01-02 DIAGNOSIS — I469 Cardiac arrest, cause unspecified: Secondary | ICD-10-CM | POA: Diagnosis not present

## 2021-01-02 DIAGNOSIS — R509 Fever, unspecified: Secondary | ICD-10-CM | POA: Diagnosis not present

## 2021-01-02 DIAGNOSIS — E871 Hypo-osmolality and hyponatremia: Secondary | ICD-10-CM | POA: Diagnosis present

## 2021-01-02 DIAGNOSIS — R778 Other specified abnormalities of plasma proteins: Secondary | ICD-10-CM

## 2021-01-02 DIAGNOSIS — Z978 Presence of other specified devices: Secondary | ICD-10-CM | POA: Diagnosis not present

## 2021-01-02 DIAGNOSIS — Z87891 Personal history of nicotine dependence: Secondary | ICD-10-CM

## 2021-01-02 DIAGNOSIS — G8929 Other chronic pain: Secondary | ICD-10-CM | POA: Diagnosis present

## 2021-01-02 DIAGNOSIS — I959 Hypotension, unspecified: Secondary | ICD-10-CM

## 2021-01-02 DIAGNOSIS — E1022 Type 1 diabetes mellitus with diabetic chronic kidney disease: Secondary | ICD-10-CM | POA: Diagnosis present

## 2021-01-02 DIAGNOSIS — I2119 ST elevation (STEMI) myocardial infarction involving other coronary artery of inferior wall: Secondary | ICD-10-CM | POA: Diagnosis present

## 2021-01-02 DIAGNOSIS — R7989 Other specified abnormal findings of blood chemistry: Secondary | ICD-10-CM | POA: Diagnosis present

## 2021-01-02 DIAGNOSIS — Z7982 Long term (current) use of aspirin: Secondary | ICD-10-CM

## 2021-01-02 DIAGNOSIS — J9601 Acute respiratory failure with hypoxia: Secondary | ICD-10-CM | POA: Diagnosis not present

## 2021-01-02 DIAGNOSIS — S301XXA Contusion of abdominal wall, initial encounter: Secondary | ICD-10-CM | POA: Diagnosis not present

## 2021-01-02 DIAGNOSIS — E872 Acidosis: Secondary | ICD-10-CM | POA: Diagnosis present

## 2021-01-02 DIAGNOSIS — I5032 Chronic diastolic (congestive) heart failure: Secondary | ICD-10-CM | POA: Diagnosis present

## 2021-01-02 DIAGNOSIS — N1832 Chronic kidney disease, stage 3b: Secondary | ICD-10-CM | POA: Diagnosis present

## 2021-01-02 DIAGNOSIS — I5082 Biventricular heart failure: Secondary | ICD-10-CM | POA: Diagnosis present

## 2021-01-02 DIAGNOSIS — I130 Hypertensive heart and chronic kidney disease with heart failure and stage 1 through stage 4 chronic kidney disease, or unspecified chronic kidney disease: Secondary | ICD-10-CM | POA: Diagnosis present

## 2021-01-02 DIAGNOSIS — R570 Cardiogenic shock: Secondary | ICD-10-CM | POA: Diagnosis present

## 2021-01-02 DIAGNOSIS — J45909 Unspecified asthma, uncomplicated: Secondary | ICD-10-CM | POA: Diagnosis present

## 2021-01-02 DIAGNOSIS — E669 Obesity, unspecified: Secondary | ICD-10-CM | POA: Diagnosis present

## 2021-01-02 DIAGNOSIS — I2510 Atherosclerotic heart disease of native coronary artery without angina pectoris: Secondary | ICD-10-CM | POA: Diagnosis present

## 2021-01-02 DIAGNOSIS — I313 Pericardial effusion (noninflammatory): Secondary | ICD-10-CM | POA: Diagnosis present

## 2021-01-02 DIAGNOSIS — Z7901 Long term (current) use of anticoagulants: Secondary | ICD-10-CM

## 2021-01-02 DIAGNOSIS — R579 Shock, unspecified: Secondary | ICD-10-CM

## 2021-01-02 DIAGNOSIS — I441 Atrioventricular block, second degree: Secondary | ICD-10-CM | POA: Diagnosis not present

## 2021-01-02 DIAGNOSIS — Z6838 Body mass index (BMI) 38.0-38.9, adult: Secondary | ICD-10-CM

## 2021-01-02 DIAGNOSIS — Z20822 Contact with and (suspected) exposure to covid-19: Secondary | ICD-10-CM | POA: Diagnosis not present

## 2021-01-02 DIAGNOSIS — E785 Hyperlipidemia, unspecified: Secondary | ICD-10-CM | POA: Diagnosis present

## 2021-01-02 DIAGNOSIS — R4182 Altered mental status, unspecified: Secondary | ICD-10-CM | POA: Diagnosis present

## 2021-01-02 DIAGNOSIS — I480 Paroxysmal atrial fibrillation: Secondary | ICD-10-CM | POA: Diagnosis present

## 2021-01-02 DIAGNOSIS — Z888 Allergy status to other drugs, medicaments and biological substances status: Secondary | ICD-10-CM

## 2021-01-02 DIAGNOSIS — D62 Acute posthemorrhagic anemia: Secondary | ICD-10-CM | POA: Diagnosis not present

## 2021-01-02 DIAGNOSIS — I213 ST elevation (STEMI) myocardial infarction of unspecified site: Secondary | ICD-10-CM

## 2021-01-02 DIAGNOSIS — I4892 Unspecified atrial flutter: Secondary | ICD-10-CM | POA: Diagnosis not present

## 2021-01-02 DIAGNOSIS — I442 Atrioventricular block, complete: Secondary | ICD-10-CM | POA: Diagnosis present

## 2021-01-02 DIAGNOSIS — J189 Pneumonia, unspecified organism: Secondary | ICD-10-CM | POA: Diagnosis not present

## 2021-01-02 DIAGNOSIS — I2111 ST elevation (STEMI) myocardial infarction involving right coronary artery: Secondary | ICD-10-CM

## 2021-01-02 DIAGNOSIS — E10311 Type 1 diabetes mellitus with unspecified diabetic retinopathy with macular edema: Secondary | ICD-10-CM | POA: Diagnosis present

## 2021-01-02 DIAGNOSIS — R001 Bradycardia, unspecified: Secondary | ICD-10-CM

## 2021-01-02 DIAGNOSIS — E119 Type 2 diabetes mellitus without complications: Secondary | ICD-10-CM

## 2021-01-02 DIAGNOSIS — Z7902 Long term (current) use of antithrombotics/antiplatelets: Secondary | ICD-10-CM

## 2021-01-02 DIAGNOSIS — Z79899 Other long term (current) drug therapy: Secondary | ICD-10-CM

## 2021-01-02 HISTORY — PX: TEMPORARY PACEMAKER: CATH118268

## 2021-01-02 LAB — I-STAT CHEM 8, ED
BUN: 57 mg/dL — ABNORMAL HIGH (ref 6–20)
Calcium, Ion: 0.97 mmol/L — ABNORMAL LOW (ref 1.15–1.40)
Chloride: 96 mmol/L — ABNORMAL LOW (ref 98–111)
Creatinine, Ser: 3.2 mg/dL — ABNORMAL HIGH (ref 0.44–1.00)
Glucose, Bld: 286 mg/dL — ABNORMAL HIGH (ref 70–99)
HCT: 33 % — ABNORMAL LOW (ref 36.0–46.0)
Hemoglobin: 11.2 g/dL — ABNORMAL LOW (ref 12.0–15.0)
Potassium: 5.1 mmol/L (ref 3.5–5.1)
Sodium: 130 mmol/L — ABNORMAL LOW (ref 135–145)
TCO2: 20 mmol/L — ABNORMAL LOW (ref 22–32)

## 2021-01-02 LAB — CBC
HCT: 34.4 % — ABNORMAL LOW (ref 36.0–46.0)
HCT: 32.7 % — ABNORMAL LOW (ref 36.0–46.0)
Hemoglobin: 11.2 g/dL — ABNORMAL LOW (ref 12.0–15.0)
Hemoglobin: 10.3 g/dL — ABNORMAL LOW (ref 12.0–15.0)
MCH: 30.2 pg (ref 26.0–34.0)
MCH: 29.9 pg (ref 26.0–34.0)
MCHC: 32.6 g/dL (ref 30.0–36.0)
MCHC: 31.5 g/dL (ref 30.0–36.0)
MCV: 92.7 fL (ref 80.0–100.0)
MCV: 94.8 fL (ref 80.0–100.0)
Platelets: 255 10*3/uL (ref 150–400)
Platelets: 237 10*3/uL (ref 150–400)
RBC: 3.71 MIL/uL — ABNORMAL LOW (ref 3.87–5.11)
RBC: 3.45 MIL/uL — ABNORMAL LOW (ref 3.87–5.11)
RDW: 14.4 % (ref 11.5–15.5)
RDW: 14.4 % (ref 11.5–15.5)
WBC: 21.6 10*3/uL — ABNORMAL HIGH (ref 4.0–10.5)
WBC: 26.2 10*3/uL — ABNORMAL HIGH (ref 4.0–10.5)
nRBC: 0 % (ref 0.0–0.2)
nRBC: 0 % (ref 0.0–0.2)

## 2021-01-02 LAB — POCT I-STAT, CHEM 8
BUN: 54 mg/dL — ABNORMAL HIGH (ref 6–20)
Calcium, Ion: 0.94 mmol/L — ABNORMAL LOW (ref 1.15–1.40)
Chloride: 97 mmol/L — ABNORMAL LOW (ref 98–111)
Creatinine, Ser: 3.1 mg/dL — ABNORMAL HIGH (ref 0.44–1.00)
Glucose, Bld: 329 mg/dL — ABNORMAL HIGH (ref 70–99)
HCT: 35 % — ABNORMAL LOW (ref 36.0–46.0)
Hemoglobin: 11.9 g/dL — ABNORMAL LOW (ref 12.0–15.0)
Potassium: 4.8 mmol/L (ref 3.5–5.1)
Sodium: 130 mmol/L — ABNORMAL LOW (ref 135–145)
TCO2: 21 mmol/L — ABNORMAL LOW (ref 22–32)

## 2021-01-02 LAB — COMPREHENSIVE METABOLIC PANEL
ALT: 81 U/L — ABNORMAL HIGH (ref 0–44)
AST: 45 U/L — ABNORMAL HIGH (ref 15–41)
Albumin: 2.9 g/dL — ABNORMAL LOW (ref 3.5–5.0)
Alkaline Phosphatase: 153 U/L — ABNORMAL HIGH (ref 38–126)
Anion gap: 24 — ABNORMAL HIGH (ref 5–15)
BUN: 55 mg/dL — ABNORMAL HIGH (ref 6–20)
CO2: 15 mmol/L — ABNORMAL LOW (ref 22–32)
Calcium: 8.3 mg/dL — ABNORMAL LOW (ref 8.9–10.3)
Chloride: 94 mmol/L — ABNORMAL LOW (ref 98–111)
Creatinine, Ser: 3.34 mg/dL — ABNORMAL HIGH (ref 0.44–1.00)
GFR, Estimated: 16 mL/min — ABNORMAL LOW (ref >60)
Glucose, Bld: 313 mg/dL — ABNORMAL HIGH (ref 70–99)
Potassium: 5.3 mmol/L — ABNORMAL HIGH (ref 3.5–5.1)
Sodium: 133 mmol/L — ABNORMAL LOW (ref 135–145)
Total Bilirubin: 1.7 mg/dL — ABNORMAL HIGH (ref 0.3–1.2)
Total Protein: 5.6 g/dL — ABNORMAL LOW (ref 6.5–8.1)

## 2021-01-02 LAB — BRAIN NATRIURETIC PEPTIDE: B Natriuretic Peptide: 275.4 pg/mL — ABNORMAL HIGH (ref 0.0–100.0)

## 2021-01-02 LAB — GLUCOSE, CAPILLARY
Glucose-Capillary: 138 mg/dL — ABNORMAL HIGH (ref 70–99)
Glucose-Capillary: 139 mg/dL — ABNORMAL HIGH (ref 70–99)
Glucose-Capillary: 198 mg/dL — ABNORMAL HIGH (ref 70–99)
Glucose-Capillary: 366 mg/dL — ABNORMAL HIGH (ref 70–99)
Glucose-Capillary: 381 mg/dL — ABNORMAL HIGH (ref 70–99)
Glucose-Capillary: 99 mg/dL (ref 70–99)

## 2021-01-02 LAB — PROTIME-INR
INR: 1.9 — ABNORMAL HIGH (ref 0.8–1.2)
INR: 2.1 — ABNORMAL HIGH (ref 0.8–1.2)
Prothrombin Time: 21.4 seconds — ABNORMAL HIGH (ref 11.4–15.2)
Prothrombin Time: 22.5 seconds — ABNORMAL HIGH (ref 11.4–15.2)

## 2021-01-02 LAB — LIPID PANEL
Cholesterol: 118 mg/dL (ref 0–200)
HDL: 42 mg/dL (ref >40)
LDL Cholesterol: 34 mg/dL (ref 0–99)
Total CHOL/HDL Ratio: 2.8 RATIO
Triglycerides: 210 mg/dL — ABNORMAL HIGH (ref <150)
VLDL: 42 mg/dL — ABNORMAL HIGH (ref 0–40)

## 2021-01-02 LAB — POCT I-STAT EG7
Acid-base deficit: 8 mmol/L — ABNORMAL HIGH (ref 0.0–2.0)
Bicarbonate: 18.9 mmol/L — ABNORMAL LOW (ref 20.0–28.0)
Calcium, Ion: 0.97 mmol/L — ABNORMAL LOW (ref 1.15–1.40)
HCT: 35 % — ABNORMAL LOW (ref 36.0–46.0)
Hemoglobin: 11.9 g/dL — ABNORMAL LOW (ref 12.0–15.0)
O2 Saturation: 88 %
Potassium: 4.8 mmol/L (ref 3.5–5.1)
Sodium: 129 mmol/L — ABNORMAL LOW (ref 135–145)
TCO2: 20 mmol/L — ABNORMAL LOW (ref 22–32)
pCO2, Ven: 44.5 mmHg (ref 44.0–60.0)
pH, Ven: 7.237 — ABNORMAL LOW (ref 7.250–7.430)
pO2, Ven: 64 mmHg — ABNORMAL HIGH (ref 32.0–45.0)

## 2021-01-02 LAB — BASIC METABOLIC PANEL
Anion gap: 16 — ABNORMAL HIGH (ref 5–15)
BUN: 55 mg/dL — ABNORMAL HIGH (ref 6–20)
CO2: 17 mmol/L — ABNORMAL LOW (ref 22–32)
Calcium: 7.8 mg/dL — ABNORMAL LOW (ref 8.9–10.3)
Chloride: 95 mmol/L — ABNORMAL LOW (ref 98–111)
Creatinine, Ser: 3.32 mg/dL — ABNORMAL HIGH (ref 0.44–1.00)
GFR, Estimated: 16 mL/min — ABNORMAL LOW (ref >60)
Glucose, Bld: 377 mg/dL — ABNORMAL HIGH (ref 70–99)
Potassium: 5.1 mmol/L (ref 3.5–5.1)
Sodium: 128 mmol/L — ABNORMAL LOW (ref 135–145)

## 2021-01-02 LAB — CBC WITH DIFFERENTIAL/PLATELET
Abs Immature Granulocytes: 0.1 10*3/uL — ABNORMAL HIGH (ref 0.00–0.07)
Basophils Absolute: 0 10*3/uL (ref 0.0–0.1)
Basophils Relative: 0 %
Eosinophils Absolute: 0.1 10*3/uL (ref 0.0–0.5)
Eosinophils Relative: 1 %
HCT: 35 % — ABNORMAL LOW (ref 36.0–46.0)
Hemoglobin: 10.6 g/dL — ABNORMAL LOW (ref 12.0–15.0)
Immature Granulocytes: 1 %
Lymphocytes Relative: 24 %
Lymphs Abs: 2.3 10*3/uL (ref 0.7–4.0)
MCH: 30.5 pg (ref 26.0–34.0)
MCHC: 30.3 g/dL (ref 30.0–36.0)
MCV: 100.6 fL — ABNORMAL HIGH (ref 80.0–100.0)
Monocytes Absolute: 0.7 10*3/uL (ref 0.1–1.0)
Monocytes Relative: 7 %
Neutro Abs: 6.4 10*3/uL (ref 1.7–7.7)
Neutrophils Relative %: 67 %
Platelets: 178 10*3/uL (ref 150–400)
RBC: 3.48 MIL/uL — ABNORMAL LOW (ref 3.87–5.11)
RDW: 14.5 % (ref 11.5–15.5)
WBC: 9.5 10*3/uL (ref 4.0–10.5)
nRBC: 0 % (ref 0.0–0.2)

## 2021-01-02 LAB — CORTISOL: Cortisol, Plasma: 25.7 ug/dL

## 2021-01-02 LAB — MAGNESIUM
Magnesium: 2.1 mg/dL (ref 1.7–2.4)
Magnesium: 1.9 mg/dL (ref 1.7–2.4)

## 2021-01-02 LAB — LACTIC ACID, PLASMA
Lactic Acid, Venous: 4.9 mmol/L (ref 0.5–1.9)
Lactic Acid, Venous: 5.2 mmol/L (ref 0.5–1.9)
Lactic Acid, Venous: 4.5 mmol/L (ref 0.5–1.9)

## 2021-01-02 LAB — RESP PANEL BY RT-PCR (FLU A&B, COVID) ARPGX2
Influenza A by PCR: NEGATIVE
Influenza B by PCR: NEGATIVE
SARS Coronavirus 2 by RT PCR: NEGATIVE

## 2021-01-02 LAB — ECHOCARDIOGRAM LIMITED
Area-P 1/2: 7.99 cm2
Height: 64 in
S' Lateral: 3 cm
Single Plane A4C EF: 49.6 %
Weight: 3460.34 oz

## 2021-01-02 LAB — TROPONIN I (HIGH SENSITIVITY)
Troponin I (High Sensitivity): 462 ng/L (ref <18)
Troponin I (High Sensitivity): 505 ng/L (ref <18)
Troponin I (High Sensitivity): 887 ng/L (ref <18)
Troponin I (High Sensitivity): 5036 ng/L (ref <18)

## 2021-01-02 LAB — HEMOGLOBIN A1C
Hgb A1c MFr Bld: 7.5 % — ABNORMAL HIGH (ref 4.8–5.6)
Mean Plasma Glucose: 168.55 mg/dL

## 2021-01-02 LAB — MRSA PCR SCREENING: MRSA by PCR: NEGATIVE

## 2021-01-02 LAB — APTT
aPTT: 34 seconds (ref 24–36)
aPTT: 112 seconds — ABNORMAL HIGH (ref 24–36)

## 2021-01-02 LAB — PHOSPHORUS: Phosphorus: 6.5 mg/dL — ABNORMAL HIGH (ref 2.5–4.6)

## 2021-01-02 SURGERY — TEMPORARY PACEMAKER
Anesthesia: LOCAL

## 2021-01-02 MED ORDER — FENTANYL BOLUS VIA INFUSION
25.0000 ug | INTRAVENOUS | Status: DC | PRN
  Administered 2021-01-02: 100 ug via INTRAVENOUS
  Administered 2021-01-02: 50 ug via INTRAVENOUS
  Filled 2021-01-02: qty 100

## 2021-01-02 MED ORDER — POLYETHYLENE GLYCOL 3350 17 G PO PACK
17.0000 g | PACK | Freq: Every day | ORAL | Status: DC
  Administered 2021-01-02: 17 g
  Filled 2021-01-02: qty 1

## 2021-01-02 MED ORDER — ROSUVASTATIN CALCIUM 5 MG PO TABS: 10.0000 mg | ORAL_TABLET | Freq: Every day | ORAL | Status: DC

## 2021-01-02 MED ORDER — MIDAZOLAM HCL 2 MG/2ML IJ SOLN: 2.0000 mg | INTRAMUSCULAR | Status: DC | PRN

## 2021-01-02 MED ORDER — ASPIRIN 81 MG PO CHEW
81.0000 mg | CHEWABLE_TABLET | Freq: Every day | ORAL | Status: DC
  Administered 2021-01-03 – 2021-01-10 (×8): 81 mg via ORAL
  Filled 2021-01-02 (×8): qty 1

## 2021-01-02 MED ORDER — VITAL HIGH PROTEIN PO LIQD: 1000.0000 mL | ORAL | Status: DC

## 2021-01-02 MED ORDER — ASPIRIN 300 MG RE SUPP
300.0000 mg | Freq: Once | RECTAL | Status: AC
  Administered 2021-01-02: 300 mg via RECTAL
  Filled 2021-01-02: qty 1

## 2021-01-02 MED ORDER — VITAMIN D 25 MCG (1000 UNIT) PO TABS
2000.0000 [IU] | ORAL_TABLET | Freq: Every day | ORAL | Status: DC
  Administered 2021-01-02: 2000 [IU]
  Filled 2021-01-02: qty 2

## 2021-01-02 MED ORDER — HEPARIN SODIUM (PORCINE) 5000 UNIT/ML IJ SOLN: 5000.0000 [IU] | Freq: Three times a day (TID) | INTRAMUSCULAR | Status: DC

## 2021-01-02 MED ORDER — ALBUTEROL SULFATE (2.5 MG/3ML) 0.083% IN NEBU
2.5000 mg | INHALATION_SOLUTION | Freq: Four times a day (QID) | RESPIRATORY_TRACT | Status: DC
  Administered 2021-01-02 (×3): 2.5 mg via RESPIRATORY_TRACT
  Filled 2021-01-02 (×3): qty 3

## 2021-01-02 MED ORDER — CHLORHEXIDINE GLUCONATE CLOTH 2 % EX PADS
6.0000 | MEDICATED_PAD | Freq: Every day | CUTANEOUS | Status: DC
  Administered 2021-01-02 – 2021-01-06 (×4): 6 via TOPICAL

## 2021-01-02 MED ORDER — DOCUSATE SODIUM 50 MG/5ML PO LIQD
100.0000 mg | Freq: Two times a day (BID) | ORAL | Status: DC
  Administered 2021-01-02: 100 mg
  Filled 2021-01-02: qty 10

## 2021-01-02 MED ORDER — ROCURONIUM BROMIDE 50 MG/5ML IV SOLN
INTRAVENOUS | Status: AC | PRN
  Administered 2021-01-02: 100 mg via INTRAVENOUS

## 2021-01-02 MED ORDER — ETOMIDATE 2 MG/ML IV SOLN
INTRAVENOUS | Status: AC | PRN
  Administered 2021-01-02: 20 mg via INTRAVENOUS

## 2021-01-02 MED ORDER — DEXMEDETOMIDINE HCL IN NACL 400 MCG/100ML IV SOLN
0.4000 ug/kg/h | INTRAVENOUS | Status: DC
  Administered 2021-01-02: 0.4 ug/kg/h via INTRAVENOUS
  Filled 2021-01-02: qty 100

## 2021-01-02 MED ORDER — ASPIRIN 81 MG PO CHEW
81.0000 mg | CHEWABLE_TABLET | Freq: Every day | ORAL | Status: DC
  Administered 2021-01-02: 81 mg
  Filled 2021-01-02: qty 1

## 2021-01-02 MED ORDER — NOREPINEPHRINE 4 MG/250ML-% IV SOLN: 0.0000 ug/min | INTRAVENOUS | Status: DC

## 2021-01-02 MED ORDER — NOREPINEPHRINE BITARTRATE 1 MG/ML IV SOLN: INTRAVENOUS | Status: DC | PRN

## 2021-01-02 MED ORDER — PROPOFOL 1000 MG/100ML IV EMUL
0.0000 ug/kg/min | INTRAVENOUS | Status: DC
  Administered 2021-01-02: 35 ug/kg/min via INTRAVENOUS
  Administered 2021-01-02: 15 ug/kg/min via INTRAVENOUS
  Administered 2021-01-02: 5 ug/kg/min via INTRAVENOUS
  Filled 2021-01-02 (×2): qty 100

## 2021-01-02 MED ORDER — FENTANYL CITRATE (PF) 100 MCG/2ML IJ SOLN: 50.0000 ug | Freq: Once | INTRAMUSCULAR | Status: DC

## 2021-01-02 MED ORDER — HEPARIN (PORCINE) IN NACL 1000-0.9 UT/500ML-% IV SOLN
INTRAVENOUS | Status: DC | PRN
  Administered 2021-01-02 (×3): 500 mL

## 2021-01-02 MED ORDER — LIDOCAINE HCL (PF) 1 % IJ SOLN
INTRAMUSCULAR | Status: DC | PRN
  Administered 2021-01-02: 2 mL

## 2021-01-02 MED ORDER — FENTANYL BOLUS VIA INFUSION
50.0000 ug | INTRAVENOUS | Status: DC | PRN
  Administered 2021-01-02: 50 ug via INTRAVENOUS
  Filled 2021-01-02: qty 100

## 2021-01-02 MED ORDER — "THROMBI-PAD 3""X3"" EX PADS"
1.0000 | MEDICATED_PAD | Freq: Once | CUTANEOUS | Status: DC
  Filled 2021-01-02: qty 1

## 2021-01-02 MED ORDER — CHLORHEXIDINE GLUCONATE 0.12% ORAL RINSE (MEDLINE KIT)
15.0000 mL | Freq: Two times a day (BID) | OROMUCOSAL | Status: DC
  Administered 2021-01-02: 15 mL via OROMUCOSAL

## 2021-01-02 MED ORDER — CEFAZOLIN SODIUM-DEXTROSE 2-4 GM/100ML-% IV SOLN
2.0000 g | INTRAVENOUS | Status: DC
  Filled 2021-01-02: qty 100

## 2021-01-02 MED ORDER — PROSOURCE TF PO LIQD
45.0000 mL | Freq: Two times a day (BID) | ORAL | Status: DC
  Administered 2021-01-02: 45 mL
  Filled 2021-01-02: qty 45

## 2021-01-02 MED ORDER — VITAMIN D 25 MCG (1000 UNIT) PO TABS
2000.0000 [IU] | ORAL_TABLET | Freq: Every day | ORAL | Status: DC
  Administered 2021-01-03 – 2021-01-10 (×8): 2000 [IU] via ORAL
  Filled 2021-01-02 (×8): qty 2

## 2021-01-02 MED ORDER — PANTOPRAZOLE SODIUM 40 MG IV SOLR
40.0000 mg | Freq: Every day | INTRAVENOUS | Status: DC
  Administered 2021-01-02: 40 mg via INTRAVENOUS
  Filled 2021-01-02: qty 40

## 2021-01-02 MED ORDER — DOCUSATE SODIUM 100 MG PO CAPS
100.0000 mg | ORAL_CAPSULE | Freq: Two times a day (BID) | ORAL | Status: DC
  Administered 2021-01-02 – 2021-01-08 (×10): 100 mg via ORAL
  Filled 2021-01-02 (×14): qty 1

## 2021-01-02 MED ORDER — IOHEXOL 350 MG/ML SOLN
INTRAVENOUS | Status: AC
  Filled 2021-01-02: qty 1

## 2021-01-02 MED ORDER — NOREPINEPHRINE 4 MG/250ML-% IV SOLN
INTRAVENOUS | Status: AC
  Filled 2021-01-02: qty 250

## 2021-01-02 MED ORDER — FENTANYL CITRATE (PF) 100 MCG/2ML IJ SOLN
INTRAMUSCULAR | Status: AC
  Filled 2021-01-02: qty 2

## 2021-01-02 MED ORDER — BUDESONIDE 0.25 MG/2ML IN SUSP
0.2500 mg | Freq: Two times a day (BID) | RESPIRATORY_TRACT | Status: DC
  Administered 2021-01-02 – 2021-01-04 (×5): 0.25 mg via RESPIRATORY_TRACT
  Filled 2021-01-02 (×6): qty 2

## 2021-01-02 MED ORDER — FENTANYL CITRATE (PF) 100 MCG/2ML IJ SOLN: 100.0000 ug | INTRAMUSCULAR | Status: DC | PRN

## 2021-01-02 MED ORDER — HEPARIN SODIUM (PORCINE) 5000 UNIT/ML IJ SOLN
INTRAMUSCULAR | Status: AC
  Administered 2021-01-02: 4000 [IU] via INTRAVENOUS
  Filled 2021-01-02: qty 1

## 2021-01-02 MED ORDER — ORAL CARE MOUTH RINSE
15.0000 mL | Freq: Two times a day (BID) | OROMUCOSAL | Status: DC
  Administered 2021-01-02 – 2021-01-03 (×2): 15 mL via OROMUCOSAL

## 2021-01-02 MED ORDER — INSULIN ASPART 100 UNIT/ML ~~LOC~~ SOLN
0.0000 [IU] | SUBCUTANEOUS | Status: DC
  Administered 2021-01-02: 3 [IU] via SUBCUTANEOUS
  Administered 2021-01-02: 15 [IU] via SUBCUTANEOUS
  Administered 2021-01-02 (×2): 2 [IU] via SUBCUTANEOUS
  Administered 2021-01-02: 15 [IU] via SUBCUTANEOUS
  Administered 2021-01-03: 2 [IU] via SUBCUTANEOUS
  Administered 2021-01-03: 5 [IU] via SUBCUTANEOUS

## 2021-01-02 MED ORDER — DOCUSATE SODIUM 50 MG/5ML PO LIQD: 100.0000 mg | Freq: Two times a day (BID) | ORAL | Status: DC | PRN

## 2021-01-02 MED ORDER — POLYETHYLENE GLYCOL 3350 17 G PO PACK: 17.0000 g | PACK | Freq: Every day | ORAL | Status: DC | PRN

## 2021-01-02 MED ORDER — HEPARIN (PORCINE) IN NACL 1000-0.9 UT/500ML-% IV SOLN
INTRAVENOUS | Status: AC
  Filled 2021-01-02: qty 1000

## 2021-01-02 MED ORDER — INSULIN DETEMIR 100 UNIT/ML ~~LOC~~ SOLN
10.0000 [IU] | Freq: Once | SUBCUTANEOUS | Status: AC
  Administered 2021-01-02: 10 [IU] via SUBCUTANEOUS
  Filled 2021-01-02: qty 0.1

## 2021-01-02 MED ORDER — FENTANYL 2500MCG IN NS 250ML (10MCG/ML) PREMIX INFUSION
50.0000 ug/h | INTRAVENOUS | Status: DC
  Administered 2021-01-02: 50 ug/h via INTRAVENOUS
  Filled 2021-01-02: qty 250

## 2021-01-02 MED ORDER — INSULIN DETEMIR 100 UNIT/ML ~~LOC~~ SOLN
10.0000 [IU] | Freq: Once | SUBCUTANEOUS | Status: AC
  Administered 2021-01-02: 10 [IU] via SUBCUTANEOUS
  Filled 2021-01-02 (×2): qty 0.1

## 2021-01-02 MED ORDER — SODIUM CHLORIDE 0.9 % IV SOLN: INTRAVENOUS | Status: DC

## 2021-01-02 MED ORDER — HEPARIN SODIUM (PORCINE) 5000 UNIT/ML IJ SOLN: 4000.0000 [IU] | Freq: Once | INTRAMUSCULAR | Status: AC

## 2021-01-02 MED ORDER — MIDAZOLAM HCL 2 MG/2ML IJ SOLN
INTRAMUSCULAR | Status: DC | PRN
  Administered 2021-01-02: 1 mg via INTRAVENOUS

## 2021-01-02 MED ORDER — FENTANYL CITRATE (PF) 100 MCG/2ML IJ SOLN
INTRAMUSCULAR | Status: DC | PRN
  Administered 2021-01-02: 25 ug via INTRAVENOUS

## 2021-01-02 MED ORDER — NOREPINEPHRINE 16 MG/250ML-% IV SOLN
0.0000 ug/min | INTRAVENOUS | Status: DC
  Administered 2021-01-02: 5 ug/min via INTRAVENOUS
  Administered 2021-01-02: 20 ug/min via INTRAVENOUS
  Administered 2021-01-03: 4 ug/min via INTRAVENOUS
  Filled 2021-01-02 (×3): qty 250

## 2021-01-02 MED ORDER — POLYETHYLENE GLYCOL 3350 17 G PO PACK
17.0000 g | PACK | Freq: Every day | ORAL | Status: DC
  Administered 2021-01-03 – 2021-01-05 (×3): 17 g via ORAL
  Filled 2021-01-02 (×7): qty 1

## 2021-01-02 MED ORDER — ONDANSETRON HCL 4 MG/2ML IJ SOLN
4.0000 mg | Freq: Four times a day (QID) | INTRAMUSCULAR | Status: DC | PRN
  Administered 2021-01-04: 4 mg via INTRAVENOUS
  Filled 2021-01-02: qty 2

## 2021-01-02 MED ORDER — MIDAZOLAM HCL 2 MG/2ML IJ SOLN
INTRAMUSCULAR | Status: AC
  Filled 2021-01-02: qty 2

## 2021-01-02 MED ORDER — ORAL CARE MOUTH RINSE
15.0000 mL | OROMUCOSAL | Status: DC
  Administered 2021-01-02 (×7): 15 mL via OROMUCOSAL

## 2021-01-02 MED ORDER — CHLORHEXIDINE GLUCONATE 4 % EX LIQD
60.0000 mL | Freq: Once | CUTANEOUS | Status: AC
  Administered 2021-01-02: 4 via TOPICAL
  Filled 2021-01-02: qty 15

## 2021-01-02 MED ORDER — PERFLUTREN LIPID MICROSPHERE
1.0000 mL | INTRAVENOUS | Status: AC | PRN
Start: 2021-01-02 — End: 2021-01-02
  Administered 2021-01-02: 2 mL via INTRAVENOUS
  Filled 2021-01-02: qty 10

## 2021-01-02 MED ORDER — LACTATED RINGERS IV BOLUS
1000.0000 mL | Freq: Once | INTRAVENOUS | Status: AC
  Administered 2021-01-02: 1000 mL via INTRAVENOUS

## 2021-01-02 MED ORDER — SODIUM CHLORIDE 0.9 % IV SOLN
INTRAVENOUS | Status: DC
  Administered 2021-01-02: 999 mL/h via INTRAVENOUS

## 2021-01-02 MED ORDER — SODIUM CHLORIDE 0.9 % IV SOLN
80.0000 mg | INTRAVENOUS | Status: AC
  Filled 2021-01-02: qty 2

## 2021-01-02 MED ORDER — LIDOCAINE HCL (PF) 1 % IJ SOLN
INTRAMUSCULAR | Status: AC
  Filled 2021-01-02: qty 30

## 2021-01-02 MED ORDER — DOCUSATE SODIUM 100 MG PO CAPS: 100.0000 mg | ORAL_CAPSULE | Freq: Two times a day (BID) | ORAL | Status: DC | PRN

## 2021-01-02 MED ORDER — NOREPINEPHRINE 4 MG/250ML-% IV SOLN
INTRAVENOUS | Status: DC | PRN
  Administered 2021-01-02: 10 ug/min via INTRAVENOUS

## 2021-01-02 MED ORDER — SODIUM CHLORIDE 0.45 % IV SOLN: INTRAVENOUS | Status: DC

## 2021-01-02 MED ORDER — CHLORHEXIDINE GLUCONATE 4 % EX LIQD
60.0000 mL | Freq: Once | CUTANEOUS | Status: DC
  Filled 2021-01-02: qty 60

## 2021-01-02 MED ORDER — CLOPIDOGREL BISULFATE 75 MG PO TABS: 75.0000 mg | ORAL_TABLET | Freq: Every day | ORAL | Status: DC

## 2021-01-02 SURGICAL SUPPLY — 13 items
CABLE ADAPT PACING TEMP 12FT (ADAPTER) ×2 IMPLANT
CATH S G BIP PACING (CATHETERS) ×2 IMPLANT
CATH SWAN GANZ VIP 7.5F (CATHETERS) ×2 IMPLANT
KIT ENCORE 26 ADVANTAGE (KITS) ×2 IMPLANT
KIT HEART LEFT (KITS) ×2 IMPLANT
PACK CARDIAC CATHETERIZATION (CUSTOM PROCEDURE TRAY) ×2 IMPLANT
PATCH THROMBIX TOPICAL PLAIN (HEMOSTASIS) ×2 IMPLANT
SHEATH PINNACLE 6F 10CM (SHEATH) ×4 IMPLANT
SHEATH PINNACLE 8F 10CM (SHEATH) ×2 IMPLANT
SLEEVE REPOSITIONING LENGTH 30 (MISCELLANEOUS) ×4 IMPLANT
TRANSDUCER W/STOPCOCK (MISCELLANEOUS) ×2 IMPLANT
TUBING CIL FLEX 10 FLL-RA (TUBING) ×2 IMPLANT
WIRE EMERALD 3MM-J .035X150CM (WIRE) ×2 IMPLANT

## 2021-01-02 NOTE — ED Provider Notes (Signed)
[facility] CARDIOVASCULAR ICU Provider Note  CSN: [id number] Arrival date & time: 01/02/21 0114  Chief Complaint(s) Altered Mental Status  HPI Colleen [name] is a 55 y.o. female recently admitted for inferior STEMI without intervention possible that was complicated by complete block attributed to the ischemia which resolves and was felt not to require a pacemaker, who presents to the emergency department by EMS after EMS was called out for worsening lethargy and hypotension.  When they arrived patient was awake and alert but lethargic.  While transporting her to the stretcher patient's had a simple couple episode and was noted to be bradycardic and hypotensive.  EKG noted heart block and patient's was started on transcutaneous pacing.  Her mental status and blood pressures improved.  She required Versed for comfort and sedation.  Upon arrival patient was unresponsive, but localized the pain.  Remainder of history, ROS, and physical exam limited due to patient's condition (unresponsive). Additional information was obtained from EMS.   Level V Cave[place].    HPI  Past Medical History Past Medical History:  Diagnosis Date  . Asthma   . Complication of anesthesia    Psuedocholinerasterase deficiency per pt  . Diabetes mellitus    TYPE II  . Diabetic macular edema (HCC)    Pt gets injections in eyes with [facility]  . Seasonal allergies    Patient Active Problem List   Diagnosis Date Noted  . Elevated troponin 01/02/2021  . Endotracheally intubated 01/02/2021  . Complete heart block ([place]) 01/02/2021  . Acute respiratory failure with hypoxemia ([place])   . Shock ([place])   . AKI (acute kidney injury) ([place])   . Paroxysmal atrial flutter ([place]) 12/28/2020  . Paroxysmal atrial fibrillation ([place]) 12/28/2020  . ST elevation myocardial infarction involving right coronary artery ([place])   . Chest pain   . Heart block AV complete ([place]) 12/19/2020  . AV block 12/19/2020  . Syncope   . Coronary  artery disease 10/07/2019  . Essential hypertension 10/07/2019  . Exertional shortness of breath 05/08/2017  . Chronic venous insufficiency 01/19/2017  . Type II diabetes mellitus ([place]) 01/17/2012  . 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 Premature menopause 01/17/2012  . GERD (gastroesophageal reflux disease)   . Asthma    Home Medication(s) Prior to Admission medications   Medication Sig Start Date End Date Taking? Authorizing Provider  albuterol (PROVENTIL HFA;VENTOLIN HFA) 108 (90 Base) MCG/ACT inhaler Inhale 1-2 puffs into the lungs every 6 (six) hours as needed for wheezing or shortness of breath.   Yes [provider]  apixaban (ELIQUIS) 5 MG TABS tablet Take 1 tablet (5 mg total) by mouth 2 (two) times daily. 12/30/20  Yes Patwardhan, Reynold Bowen, MD  aspirin 81 MG EC tablet Take 1 tablet (81 mg total) by mouth daily. Swallow whole. 12/30/20  Yes Patwardhan, Manish J, MD  Cholecalciferol (VITAMIN D) 2000 units tablet Take 2,000 Units by mouth daily.   Yes [provider]  clopidogrel (PLAVIX) 75 MG tablet Take 1 tablet (75 mg total) by mouth daily. 12/30/20  Yes Patwardhan, Manish J, MD  ferrous sulfate 325 (65 FE) MG tablet Take 325 mg by mouth daily with breakfast.   Yes [provider]  insulin lispro (HUMALOG) 100 UNIT/ML injection Inject into the skin See admin instructions. Insulin Pump - Mini Medtronic 670G   Yes [provider]  loratadine (CLARITIN) 10 MG tablet Take 1 tablet by mouth daily as needed for allergies.   Yes [provider]  nitroGLYCERIN (NITROSTAT) 0.4 MG SL tablet Place 1 tablet (0.4 mg total) under the tongue every 5 (five) minutes x 3 doses as needed for chest pain. 12/29/20  Yes Patwardhan, Manish J, MD  rosuvastatin (CRESTOR) 10 MG tablet Take 10 mg by mouth at bedtime. 06/17/20  Yes [provider]  sacubitril-valsartan (ENTRESTO) 24-26 MG Take 0.5 tablets by mouth 2 (two) times daily. 01/01/21  Yes  Cantwell, Celeste C, PA-C  tobramycin (TOBREX) 0.3 % ophthalmic solution Place 2 drops into both eyes as needed (prior to injections).  12/03/18  Yes [provider]  torsemide (DEMADEX) 20 MG tablet Take 1 tablet (20 mg total) by mouth as directed. One tab in the morning Additional 1 tab in the afternoon if there is leg swelling Patient taking differently: Take 20 mg by mouth 2 (two) times daily as needed (leg swelling). 12/30/20  Yes Patwardhan, Reynold Bowen, MD                                                                                                                                    Past Surgical History Past Surgical History:  Procedure Laterality Date  . BACK SURGERY  2009  . CATARACT SURGERY     BILATERAL  . CESAREAN SECTION     X2  . KNEE SURGERY Right   . LEFT HEART CATH AND CORONARY ANGIOGRAPHY N/A 05/08/2017   Procedure: Left Heart Cath and Coronary Angiography;  Surgeon: Nigel Mormon, MD;  Location: [facility];  Service: Cardiovascular;  Laterality: N/A;  . ORIF ANKLE FRACTURE Left 03/23/2016   Procedure: OPEN REDUCTION INTERNAL FIXATION (ORIF) ANKLE FRACTURE MALLEOLUS  MALUNION WITH TIBIAL AND FIBULAR OSTEOTOMY AND AUTOGRAFT;  Surgeon: Wylene Simmer, MD;  Location: [facility];  Service: Orthopedics;  Laterality: Left;  . RIGHT/LEFT HEART CATH AND CORONARY ANGIOGRAPHY N/A 12/24/2020   Procedure: RIGHT/LEFT HEART CATH AND CORONARY ANGIOGRAPHY;  Surgeon: Nigel Mormon, MD;  Location: [facility];  Service: Cardiovascular;  Laterality: N/A;  . SHOULDER ARTHROSCOPY W/ ROTATOR CUFF REPAIR Left   . TEMPORARY PACEMAKER N/A 12/19/2020   Procedure: TEMPORARY PACEMAKER;  Surgeon: Nigel Mormon, MD;  Location: [facility];  Service: Cardiovascular;  Laterality: N/A;  . TEMPORARY PACEMAKER N/A 12/19/2020   Procedure: TEMPORARY PACEMAKER;  Surgeon: Nigel Mormon, MD;  Location: [facility];  Service: Cardiovascular;   Laterality: N/A;   Family History Family History  Problem Relation Age of Onset  . Hypertension Father   . Hypertension Paternal Aunt   . Hypertension Paternal Grandmother   . Hypertension Paternal Grandfather     Social History Social History   Tobacco Use  . Smoking status: Former Smoker    Packs/day: 0.25    Years: 2.00    Pack years: 0.50    Types: Cigarettes    Quit date: 01/17/1987    Years since quitting:  33.9  . Smokeless tobacco: Never Used  Vaping Use  . Vaping Use: Never used  Substance Use Topics  . Alcohol use: Yes    Comment: occ  . Drug use: No   Allergies Other and Succinylcholine  Review of Systems Review of Systems  Unable to perform ROS: Patient unresponsive    Physical Exam Vital Signs  I have reviewed the triage vital signs BP 97/70   Resp 15   Ht '5\' 4"'  (1.626 m)   BMI 36.73 kg/m   Physical Exam Vitals reviewed.  Constitutional:      General: She is not in acute distress.    Appearance: She is well-developed. She is not diaphoretic.  HENT:     Head: Normocephalic and atraumatic.     Nose: Nose normal.  Eyes:     General: No scleral icterus.       Right eye: No discharge.        Left eye: No discharge.     Conjunctiva/sclera: Conjunctivae normal.     Pupils: Pupils are equal, round, and reactive to light.  Cardiovascular:     Heart sounds: No murmur heard. No friction rub.     Comments: Paced at 60 Pulmonary:     Effort: Pulmonary effort is normal. No respiratory distress.     Breath sounds: Normal breath sounds. No stridor. No rales.  Abdominal:     General: There is no distension.     Palpations: Abdomen is soft.     Tenderness: There is no abdominal tenderness.  Musculoskeletal:        General: No tenderness.     Cervical back: Normal range of motion and neck supple.  Skin:    General: Skin is warm and dry.     Findings: No erythema or rash.  Neurological:     Mental Status: She is unresponsive.     GCS: GCS eye  subscore is 4. GCS verbal subscore is 1. GCS motor subscore is 5.     ED Results and Treatments Labs (all labs ordered are listed, but only abnormal results are displayed) Labs Reviewed  HEMOGLOBIN A1C - Abnormal; Notable for the following components:      Result Value   Hgb A1c MFr Bld 7.5 (*)    All other components within normal limits  CBC WITH DIFFERENTIAL/PLATELET - Abnormal; Notable for the following components:   RBC 3.48 (*)    Hemoglobin 10.6 (*)    HCT 35.0 (*)    MCV 100.6 (*)    Abs Immature Granulocytes 0.10 (*)    All other components within normal limits  PROTIME-INR - Abnormal; Notable for the following components:   Prothrombin Time 21.4 (*)    INR 1.9 (*)    All other components within normal limits  COMPREHENSIVE METABOLIC PANEL - Abnormal; Notable for the following components:   Sodium 133 (*)    Potassium 5.3 (*)    Chloride 94 (*)    CO2 15 (*)    Glucose, Bld 313 (*)    BUN 55 (*)    Creatinine, Ser 3.34 (*)    Calcium 8.3 (*)    Total Protein 5.6 (*)    Albumin 2.9 (*)    AST 45 (*)    ALT 81 (*)    Alkaline Phosphatase 153 (*)    Total Bilirubin 1.7 (*)    GFR, Estimated 16 (*)    Anion gap 24 (*)    All other components within normal limits  LIPID PANEL - Abnormal; Notable for the following components:   Triglycerides 210 (*)    VLDL 42 (*)    All other components within normal limits  BRAIN NATRIURETIC PEPTIDE - Abnormal; Notable for the following components:   B Natriuretic Peptide 275.4 (*)    All other components within normal limits  CBC - Abnormal; Notable for the following components:   WBC 21.6 (*)    RBC 3.71 (*)    Hemoglobin 11.2 (*)    HCT 34.4 (*)    All other components within normal limits  BASIC METABOLIC PANEL - Abnormal; Notable for the following components:   Sodium 128 (*)    Chloride 95 (*)    CO2 17 (*)    Glucose, Bld 377 (*)    BUN 55 (*)    Creatinine, Ser 3.32 (*)    Calcium 7.8 (*)    GFR, Estimated 16  (*)    Anion gap 16 (*)    All other components within normal limits  PHOSPHORUS - Abnormal; Notable for the following components:   Phosphorus 6.5 (*)    All other components within normal limits  LACTIC ACID, PLASMA - Abnormal; Notable for the following components:   Lactic Acid, Venous 4.9 (*)    All other components within normal limits  PROTIME-INR - Abnormal; Notable for the following components:   Prothrombin Time 22.5 (*)    INR 2.1 (*)    All other components within normal limits  APTT - Abnormal; Notable for the following components:   aPTT 112 (*)    All other components within normal limits  GLUCOSE, CAPILLARY - Abnormal; Notable for the following components:   Glucose-Capillary 366 (*)    All other components within normal limits  GLUCOSE, CAPILLARY - Abnormal; Notable for the following components:   Glucose-Capillary 381 (*)    All other components within normal limits  LACTIC ACID, PLASMA - Abnormal; Notable for the following components:   Lactic Acid, Venous 5.2 (*)    All other components within normal limits  I-STAT CHEM 8, ED - Abnormal; Notable for the following components:   Sodium 130 (*)    Chloride 96 (*)    BUN 57 (*)    Creatinine, Ser 3.20 (*)    Glucose, Bld 286 (*)    Calcium, Ion 0.97 (*)    TCO2 20 (*)    Hemoglobin 11.2 (*)    HCT 33.0 (*)    All other components within normal limits  POCT I-STAT, CHEM 8 - Abnormal; Notable for the following components:   Sodium 130 (*)    Chloride 97 (*)    BUN 54 (*)    Creatinine, Ser 3.10 (*)    Glucose, Bld 329 (*)    Calcium, Ion 0.94 (*)    TCO2 21 (*)    Hemoglobin 11.9 (*)    HCT 35.0 (*)    All other components within normal limits  POCT I-STAT EG7 - Abnormal; Notable for the following components:   pH, Ven 7.237 (*)    pO2, Ven 64.0 (*)    Bicarbonate 18.9 (*)    TCO2 20 (*)    Acid-base deficit 8.0 (*)    Sodium 129 (*)    Calcium, Ion 0.97 (*)    HCT 35.0 (*)    Hemoglobin 11.9 (*)     All other components within normal limits  TROPONIN I (HIGH SENSITIVITY) - Abnormal; Notable for the following components:   Troponin  I (High Sensitivity) 462 (*)    All other components within normal limits  TROPONIN I (HIGH SENSITIVITY) - Abnormal; Notable for the following components:   Troponin I (High Sensitivity) 505 (*)    All other components within normal limits  RESP PANEL BY RT-PCR (FLU A&B, COVID) ARPGX2  MRSA PCR SCREENING  APTT  MAGNESIUM  MAGNESIUM  CORTISOL  BLOOD GAS, ARTERIAL  LACTIC ACID, PLASMA  CBC  TROPONIN I (HIGH SENSITIVITY)                                                                                                                         EKG  EKG Interpretation  Date/Time:    Ventricular Rate:    PR Interval:    QRS Duration:   QT Interval:    QTC Calculation:   R Axis:     Text Interpretation:        Radiology CARDIAC CATHETERIZATION  Result Date: 01/02/2021 Successful insertion of temporary transvenous Pacemaker. Plan: Per CCM. Primary cardiology to reconsult EP service for consideration of permanent pacemaker.   DG Chest Port 1 View  Result Date: 01/02/2021 CLINICAL DATA:  Intubation EXAM: PORTABLE CHEST 1 VIEW COMPARISON:  Earlier today FINDINGS: Endotracheal tube with tip between the clavicular heads and carina. The enteric tube reaches the stomach. Pacer lead from below with tip at the right ventricular level. Cardiomegaly which is unchanged. Artifact from EKG leads. No convincing edema. No effusion or pneumothorax. IMPRESSION: Temporary pacer lead from below with tip over the right ventricular level. Otherwise stable from earlier the same day. Electronically Signed   By: Monte Fantasia M.D.   On: 01/02/2021 05:20   DG Chest Port 1 View  Result Date: 01/02/2021 CLINICAL DATA:  Heart block intubation EXAM: PORTABLE CHEST 1 VIEW COMPARISON:  12/30/2020, 12/23/2020 FINDINGS: Interval intubation, tip of the endotracheal tube is just above  the carina. Esophageal tube tip below the diaphragm but incompletely visualized. Mild cardiomegaly without edema or focal airspace disease. Aortic atherosclerosis IMPRESSION: Interval intubation with tip of the endotracheal tube just above the carina. Cardiomegaly Electronically Signed   By: Donavan Foil M.D.   On: 01/02/2021 01:51    Pertinent labs & imaging results that were available during my care of the patient were reviewed by me and considered in my medical decision making (see chart for details).  Medications Ordered in ED Medications  0.9 %  sodium chloride infusion (0 mLs Intravenous Stopped 01/02/21 0310)  fentaNYL (SUBLIMAZE) injection 100 mcg ( Intravenous MAR Unhold 01/02/21 0352)  fentaNYL (SUBLIMAZE) injection 100 mcg ( Intravenous MAR Unhold 01/02/21 0352)  midazolam (VERSED) injection 2 mg ( Intravenous MAR Unhold 01/02/21 0352)  midazolam (VERSED) injection 2 mg ( Intravenous MAR Unhold 01/02/21 0352)  aspirin chewable tablet 81 mg (has no administration in time range)  cholecalciferol (VITAMIN D3) tablet 2,000 Units (has no administration in time range)  rosuvastatin (CRESTOR) tablet 10 mg (has no administration in time range)  chlorhexidine gluconate (  MEDLINE KIT) (PERIDEX) 0.12 % solution 15 mL (15 mLs Mouth Rinse Given 01/02/21 0848)  MEDLINE mouth rinse (15 mLs Mouth Rinse Given 01/02/21 0557)  Chlorhexidine Gluconate Cloth 2 % PADS 6 each (6 each Topical Given 01/02/21 0330)  docusate (COLACE) 50 MG/5ML liquid 100 mg (has no administration in time range)  polyethylene glycol (MIRALAX / GLYCOLAX) packet 17 g (has no administration in time range)  docusate (COLACE) 50 MG/5ML liquid 100 mg (has no administration in time range)  polyethylene glycol (MIRALAX / GLYCOLAX) packet 17 g (has no administration in time range)  pantoprazole (PROTONIX) injection 40 mg (has no administration in time range)  ondansetron (ZOFRAN) injection 4 mg (has no administration in time range)   albuterol (PROVENTIL) (2.5 MG/3ML) 0.083% nebulizer solution 2.5 mg (2.5 mg Nebulization Given 01/02/21 0828)  budesonide (PULMICORT) nebulizer solution 0.25 mg (0.25 mg Nebulization Given 01/02/21 0828)  fentaNYL (SUBLIMAZE) injection 50 mcg (50 mcg Intravenous Not Given 01/02/21 0455)  fentaNYL 2540mg in NS 2553m(1049mml) infusion-PREMIX (150 mcg/hr Intravenous Rate/Dose Change 01/02/21 0832)  propofol (DIPRIVAN) 1000 MG/100ML infusion (35 mcg/kg/min  97.1 kg Intravenous New Bag/Given 01/02/21 0812)  norepinephrine (LEVOPHED) 16 mg in 250m67memix infusion (30 mcg/min Intravenous Infusion Verify 01/02/21 0600)  insulin aspart (novoLOG) injection 0-15 Units (15 Units Subcutaneous Given 01/02/21 0816)  insulin detemir (LEVEMIR) injection 10 Units (has no administration in time range)  fentaNYL (SUBLIMAZE) bolus via infusion 25-100 mcg (100 mcg Intravenous Bolus from Bag 01/02/21 0830)  feeding supplement (VITAL HIGH PROTEIN) liquid 1,000 mL (has no administration in time range)  feeding supplement (PROSource TF) liquid 45 mL (has no administration in time range)  etomidate (AMIDATE) injection (20 mg Intravenous Given 01/02/21 0126)  rocuronium (ZEMURON) injection (100 mg Intravenous Given 01/02/21 0126)  aspirin suppository 300 mg (300 mg Rectal Given 01/02/21 0200)  heparin injection 4,000 Units (4,000 Units Intravenous Given 01/02/21 0200)                                                                                                                                    Procedures .1-3 Lead EKG Interpretation Performed by: CardFatima Blank Authorized by: CardFatima Blank   Comments:     Paced. unlying rhythm with likely second degree block Procedure Name: Intubation Date/Time: 01/02/2021 8:50 AM Performed by: CardFatima Blank Pre-anesthesia Checklist: Patient identified, Patient being monitored, Emergency Drugs available, Timeout performed and Suction  available Oxygen Delivery Method: Non-rebreather mask Preoxygenation: Pre-oxygenation with 100% oxygen Induction Type: Rapid sequence Ventilation: Mask ventilation without difficulty Grade View: Grade I Tube size: 7.5 mm Number of attempts: 1 Airway Equipment and Method: Rigid stylet and Video-laryngoscopy Placement Confirmation: ETT inserted through vocal cords under direct vision,  CO2 detector and Breath sounds checked- equal and bilateral Secured at: 24 cm Tube secured with: ETT holder    .Critical Care Performed by: CardFatima Blank Authorized by: CardLeonette Monarch  Grayce Sessions, MD   Critical care provider statement:    Critical care time (minutes):  45   Critical care was necessary to treat or prevent imminent or life-threatening deterioration of the following conditions:  Cardiac failure   Critical care was time spent personally by me on the following activities:  Discussions with consultants, evaluation of patient's response to treatment, examination of patient, ordering and performing treatments and interventions, ordering and review of laboratory studies, ordering and review of radiographic studies, pulse oximetry, re-evaluation of patient's condition, obtaining history from patient or surrogate and review of old charts   Care discussed with: admitting provider      (including critical care time)  Medical Decision Making / ED Course I have reviewed the nursing notes for this encounter and the patient's prior records (if available in EHR or on provided paperwork).   Colleen [name] was evaluated in Emergency Department on 01/02/2021 for the symptoms described in the history of present illness. She was evaluated in the context of the global COVID-19 pandemic, which necessitated consideration that the patient might be at risk for infection with the SARS-CoV-2 virus that causes COVID-19. Institutional protocols and algorithms that pertain to the evaluation of patients at risk  for COVID-19 are in a state of rapid change based on information released by regulatory bodies including the CDC and federal and state organizations. These policies and algorithms were followed during the patient's care in the ED.  Patient intubated for airway protection.  Transition to our pacing.  EKG from EMS showed evidence of ST segment elevation in the inferior leads concerning for STEMI.  Code STEMI was called.  Spoke with Dr. [place] who coordinate care for Cath Lab.  Patient will have a transvenous pacer placed.  Prior to going up to the Cath Lab patient's became hypotensive even while pacing and required pressors.  She was also given IV fluid boluses.  Husband was updated.      Final Clinical Impression(s) / ED Diagnoses Final diagnoses:  ST elevation myocardial infarction involving right coronary artery [facility])      This chart was dictated using voice recognition software.  Despite best efforts to proofread,  errors can occur which can change the documentation meaning.   Fatima Blank, MD 01/02/21 [phone]

## 2021-01-02 NOTE — Consult Note (Addendum)
CARDIOLOGY CONSULT NOTE  Patient ID: Colleen [name] MRN: [id number] DOB/AGE: November 07, 1965 55 y.o.  Admit date: 01/02/2021 Attending physician: Margaretha Seeds, MD Primary Physician:  Orpah Melter, MD Outpatient Cardiologist: Dr. Vernell Leep Inpatient Cardiologist: Rex Kras, DO, [facility]  Requesting [place], Darlis Loan, MD Reason of consultation: Heart block, CAD, recent MI  Chief complaint: Nausea, syncope, low heart rate  HPI:  Colleen [name] is a 55 y.o. 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  Patient was seen and examined at bedside at approximately 10:30 AM he is currently intubated and sedated at the time of evaluation.  She is accompanied by her husband Clair Gulling at bedside.    Yesterday she called the on-call service as she had noticed 4 pounds of weight gain, transient low blood pressures, water recommendations with regards to diuresis with Entresto.  That shared decision was to hold Entresto and take 20 mg of Lasix p.o. x1 to help facilitate diuresis.  However, yesterday night patient's husband notes that her blood pressure continued to be labile, the patient wanted woke up at night wanting to go to the bathroom he checked his vital signs and the heart rate was noted to be around 30 beats a minute.  He was concerned and therefore he assisted her to get up and thereafter had a syncopal event.  Prior to her syncopal event patient was diaphoretic, nauseated, and little bit of disorientation but still mentating well.  After noticing a syncopal event patient's husband was concerned called EMS and patient was brought to the hospital for further  evaluation and management.  In route patient continued to have hypotension with transient sinus with first degree, second-degree type I, and secondary type II AV block.  She required assistance with ventilation in route and later was intubated for airway protection in the ER.  EKG showed ST elevations in the inferior leads concerning for inferior STEMI and due to symptomatic bradycardia and syncope Cath Lab was activated and patient underwent transvenous pacing and transferred to CVICU for further management.    Patient required pacing support overnight but this morning has an intrinsic rhythm.  She continues to be on Levophed for blood pressure support and maintaining a MAP greater than 65.  She is currently intubated with plans for spontaneous breathing trials later today and hopefully extubation.  Of note, patient was discharged on 12/30/2020 after being admitted for late presentation of inferior STEMI as initial troponin was 75,000.  She also had acute kidney injury during that hospitalization which subsequently improved at the time of discharge.  She also had evidence of complete heart block which required temporary pacemaker placement and since it was thought that her conduction disease was ischemic driven and she had regained normal sinus rhythm permanent maker was not implanted during the last hospitalization.  She is also noted to have a brief episode of paroxysmal atrial fibrillation in the shared decision was to anticoagulate for 4 weeks and to reevaluate.  Given her coronary disease the plan was to optimize her guideline directed medical therapy with undergo stress perfusion MRI with gadolinium to evaluate for viable myocardium and coordinate a revascularization strategy.  ALLERGIES: Allergies  Allergen Reactions  . Other Other (See Comments)  . Succinylcholine  Other (See Comments)    Pseudocholinesterase deficiency    PAST MEDICAL HISTORY: Past Medical History:  Diagnosis Date  . Asthma    . Complication of anesthesia    Psuedocholinerasterase deficiency per pt  . Diabetes mellitus    TYPE II  . Diabetic macular edema (HCC)    Pt gets injections in eyes with Eyelea  . Seasonal allergies     PAST SURGICAL HISTORY: Past Surgical History:  Procedure Laterality Date  . BACK SURGERY  2009  . CATARACT SURGERY     BILATERAL  . CESAREAN SECTION     X2  . KNEE SURGERY Right   . LEFT HEART CATH AND CORONARY ANGIOGRAPHY N/A 05/08/2017   Procedure: Left Heart Cath and Coronary Angiography;  Surgeon: Nigel Mormon, MD;  Location: [place] [facility];  Service: Cardiovascular;  Laterality: N/A;  . ORIF ANKLE FRACTURE Left 03/23/2016   Procedure: OPEN REDUCTION INTERNAL FIXATION (ORIF) ANKLE FRACTURE MALLEOLUS  MALUNION WITH TIBIAL AND FIBULAR OSTEOTOMY AND AUTOGRAFT;  Surgeon: Wylene Simmer, MD;  Location: [place];  Service: Orthopedics;  Laterality: Left;  . RIGHT/LEFT HEART CATH AND CORONARY ANGIOGRAPHY N/A 12/24/2020   Procedure: RIGHT/LEFT HEART CATH AND CORONARY ANGIOGRAPHY;  Surgeon: Nigel Mormon, MD;  Location: [place] [facility];  Service: Cardiovascular;  Laterality: N/A;  . SHOULDER ARTHROSCOPY W/ ROTATOR CUFF REPAIR Left   . TEMPORARY PACEMAKER N/A 12/19/2020   Procedure: TEMPORARY PACEMAKER;  Surgeon: Nigel Mormon, MD;  Location: [place] [facility];  Service: Cardiovascular;  Laterality: N/A;  . TEMPORARY PACEMAKER N/A 12/19/2020   Procedure: TEMPORARY PACEMAKER;  Surgeon: Nigel Mormon, MD;  Location: [place] [facility];  Service: Cardiovascular;  Laterality: N/A;    FAMILY HISTORY: The patient family history includes Hypertension in her father, paternal aunt, paternal grandfather, and paternal grandmother.   SOCIAL HISTORY:  The patient  reports that she quit smoking about 33 years ago. Her smoking use included cigarettes. She has a 0.50 pack-year smoking history. She has never used smokeless tobacco. She reports current  alcohol use. She reports that she does not use drugs.  MEDICATIONS: Current Outpatient Medications  Medication Instructions  . albuterol (PROVENTIL HFA;VENTOLIN HFA) 108 (90 Base) MCG/ACT inhaler 1-2 puffs, Inhalation, Every 6 hours PRN  . Aspirin Low Dose 81 mg, Oral, Daily, Swallow whole.  . clopidogrel (PLAVIX) 75 mg, Oral, Daily  . Eliquis 5 mg, Oral, 2 times daily  . ferrous sulfate 325 mg, Oral, Daily with breakfast  . insulin lispro (HUMALOG) 100 UNIT/ML injection Subcutaneous, See admin instructions, Insulin Pump - Mini Medtronic 670G   . loratadine (CLARITIN) 10 MG tablet 1 tablet, Oral, Daily PRN  . nitroGLYCERIN (NITROSTAT) 0.4 mg, Sublingual, Every 5 min x3 PRN  . rosuvastatin (CRESTOR) 10 mg, Oral, Daily at bedtime  . sacubitril-valsartan (ENTRESTO) 24-26 MG 0.5 tablets, Oral, 2 times daily  . tobramycin (TOBREX) 0.3 % ophthalmic solution 2 drops, Both Eyes, As needed  . torsemide (DEMADEX) 20 mg, Oral, As directed, One tab in the morning<BR>Additional 1 tab in the afternoon if there is leg swelling  . Vitamin D 2,000 Units, Oral, Daily    REVIEW OF SYSTEMS: Review of Systems  Unable to perform ROS: intubated  However as per the patient's husband at bedside she was experiencing lightheaded and dizziness.  Prior treatments diaphoretic, nauseated, syncope.  She did not complain of chest discomfort as per patient's husband Clair Gulling.  PHYSICAL EXAM: Vitals with BMI 01/02/2021 01/02/2021 01/02/2021  Height - - -  Weight - - -  BMI - - -  Systolic 409 811 914  Diastolic 62 60 58  Pulse 782 104 108     Intake/Output Summary (Last 24 hours) at 01/02/2021 1157 Last data filed at 01/02/2021 0600 Gross per 24 hour  Intake 422.68 ml  Output 150 ml  Net 272.68 ml   CONSTITUTIONAL: Appears older than stated age, hemodynamically stable, no acute distress, intubated/sedated SKIN: Skin is warm and dry. No rash noted. No cyanosis. No pallor. No jaundice HEAD: Normocephalic and  atraumatic.  EYES: No scleral icterus MOUTH/THROAT: ET and OG tube in place NECK: No JVD present. No thyromegaly noted. No carotid bruits  LYMPHATIC: No visible cervical adenopathy.  CHEST equal rise and fall of chest cavity. LUNGS: Mechanical breath sounds bilaterally. No wheezes. No rales.  CARDIOVASCULAR: Tachycardia, positive S1-S2, no murmurs rubs or gallops appreciated secondary to tachycardia. ABDOMINAL: Soft, nontender, nondistended, positive bowel sounds in all 4 quadrants, no apparent ascites.  EXTREMITIES: No peripheral edema, warm to touch, poor skin hygiene.  TVP present at the right femoral site, no hematoma or bruit present, dried blood noted on the causes. HEMATOLOGIC: No significant bruising NEUROLOGIC: Sedated, follows commands, moves all 4 extremities, purposeful movements.   PSYCHIATRIC: Intubated and sedated.  RADIOLOGY: CARDIAC CATHETERIZATION  Result Date: 01/02/2021 Successful insertion of temporary transvenous Pacemaker. Plan: Per CCM. Primary cardiology to reconsult EP service for consideration of permanent pacemaker.   DG Chest Port 1 View  Result Date: 01/02/2021 CLINICAL DATA:  Intubation EXAM: PORTABLE CHEST 1 VIEW COMPARISON:  Earlier today FINDINGS: Endotracheal tube with tip between the clavicular heads and carina. The enteric tube reaches the stomach. Pacer lead from below with tip at the right ventricular level. Cardiomegaly which is unchanged. Artifact from EKG leads. No convincing edema. No effusion or pneumothorax. IMPRESSION: Temporary pacer lead from below with tip over the right ventricular level. Otherwise stable from earlier the same day. Electronically Signed   By: Monte Fantasia M.D.   On: 01/02/2021 05:20   DG Chest Port 1 View  Result Date: 01/02/2021 CLINICAL DATA:  Heart block intubation EXAM: PORTABLE CHEST 1 VIEW COMPARISON:  12/30/2020, 12/23/2020 FINDINGS: Interval intubation, tip of the endotracheal tube is just above the carina.  Esophageal tube tip below the diaphragm but incompletely visualized. Mild cardiomegaly without edema or focal airspace disease. Aortic atherosclerosis IMPRESSION: Interval intubation with tip of the endotracheal tube just above the carina. Cardiomegaly Electronically Signed   By: Donavan Foil M.D.   On: 01/02/2021 01:51    LABORATORY DATA: Lab Results  Component Value Date   WBC 21.6 (H) 01/02/2021   HGB 11.2 (L) 01/02/2021   HCT 34.4 (L) 01/02/2021   MCV 92.7 01/02/2021   PLT 255 01/02/2021    Recent Labs  Lab 01/02/21 0136 01/02/21 0141 01/02/21 0406  NA 133*   < > 128*  K 5.3*   < > 5.1  CL 94*   < > 95*  CO2 15*  --  17*  BUN 55*   < > 55*  CREATININE 3.34*   < > 3.32*  CALCIUM 8.3*  --  7.8*  PROT 5.6*  --   --   BILITOT 1.7*  --   --   ALKPHOS 153*  --   --   ALT 81*  --   --   AST 45*  --   --   GLUCOSE 313*   < > 377*   < > =  values in this interval not displayed.    Lipid Panel     Component Value Date/Time   CHOL 118 01/02/2021 0136   CHOL 164 10/08/2019 0901   TRIG 210 (H) 01/02/2021 0136   HDL 42 01/02/2021 0136   HDL 99 10/08/2019 0901   CHOLHDL 2.8 01/02/2021 0136   VLDL 42 (H) 01/02/2021 0136   LDLCALC 34 01/02/2021 0136   LDLCALC 54 10/08/2019 0901    BNP (last 3 results) Recent Labs    12/21/20 1012 01/02/21 0406  BNP 1,323.7* 275.4*    HEMOGLOBIN A1C Lab Results  Component Value Date   HGBA1C 7.5 (H) 01/02/2021   MPG 168.55 01/02/2021    Cardiac Panel (last 3 results) Recent Labs    01/02/21 0136 01/02/21 0406 01/02/21 0921  TROPONINIHS 462* 505* 887*   TSH Recent Labs    12/21/20 1012  TSH 1.862     CARDIAC DATABASE: Coronary angiography 12/24/2020: LM: Normal LAD: Prox mod calcific 40% stenosis (Unchanges compared to cath 2018) LCx: Mid 100% occlusion, likely chronic. Reconstitution through retrograde filling from OM2 (New since cath 2018) RCA: Ostial 100% occlusion, likely the culprit for her STEMI with late  presentation Retrograde filling from LAD up to distal RCA (New since cath 2018)  RAL 12 mmHg LVEDP: 10 mmHg  Echocardiogram 12/19/2020: 1. Left ventricular ejection fraction, by estimation, is 55 to 60%. The  left ventricle has normal function. The left ventricle demonstrates  regional wall motion abnormalities (see scoring diagram/findings for  description). Left ventricular diastolic  parameters are indeterminate. There is mild hypokinesis of the left  ventricular, basal inferolateral wall.  2. Right ventricular systolic function is moderately reduced. The right  ventricular size is moderately enlarged. Estimated PASP 33 mmHg.  3. Right atrial size was mildly dilated.  4. Tricuspid valve regurgitation is severe.  5. The inferior vena cava is normal in size with <50% respiratory  variability, suggesting right atrial pressure of 8 mmHg.  6. Comapared to previous study report in 2018, LV/RV wall motion  abnormalities are new.   IMPRESSION & RECOMMENDATIONS: Colleen [name] is a 55 y.o. Caucasian female whose past medical history and cardiovascular risk factors include:  Type 1 diabetes mellitus on insulin pump, CAD, status post inferior STEMI late presentation, complete heart block, right heart failure, paroxysmal atrial fibrillation.  Symptomatic bradycardia with syncope: Most likely secondary to underlying conduction disease due to ischemic substrate as she presented as a late inferior STEMI during her prior hospitalization. Her conduction disease had improved prior to discharge and therefore permanent pacemaker was not implanted. She now presents with symptomatic bradycardia and syncope. Underwent transvenous pacing with Dr. Martinique earlier this morning. Spoke to cardiac electrophysiologist Dr. Thompson Grayer regarding her case. He has seen the patient this morning w/ tentative plan for permanent pacemaker tomorrow as long as she remains hemodynamically stable.  Appreciate  his guidance. Currently she has an intrinsic rhythm and not requiring pacer support. EMS run sheet reviewed. EKG from yesterday night unable to locate in the EMR, physical chart, or at bedside.  I have asked the patient's nurse to see if ER still has a copy of it.  Second-degree type II AV block: See above  Atherosclerosis of the native coronary arteries without angina pectoris: Status post recent STEMI (late presentation) culprit lesion noted to be ostial RCA.  Patient also has obstructive disease involving the circumflex distribution. Recent left heart catheterization report noted above for further reference At the time of discharge during  the last hospitalization the shared decision was to stabilize the patient with guideline directed medical therapy & once kidney function improves to evaluate for viable myocardium prior to revascularization. Continue aspirin. Plavix currently held due to increased oozing at the site of TVP. Currently not on AV nodal blocking agents due to symptomatic bradycardia requiring TVP support Currently not on ACE inhibitors/ARB/Arni due to acute kidney injury Will avoid additional guideline directed medical therapy as she is currently on pressure support Hold Crestor due to elevated liver function. Echo pending.  Check EKG  Elevated troponins: Most likely secondary to supply demand ischemia given her known coronary artery disease and recent inferior STEMI, presenting with progressive conduction disease and syncope.  Troponins are lower than her prior hospitalization visits. Monitor for now.  Paroxysmal atrial fibrillation: Detected during her last hospitalization. Was sent home on Eliquis 5 mg p.o. twice daily for 4 weeks and then to reevaluate. Continue to monitor on telemetry.  Acute hypoxic respiratory failure: Intubated in ER 01/02/2021. Currently weaning down on propofol.  Also on fentanyl. Plans for weaning trial later this afternoon with goals of  extubation thereafter Ventilator management per CCM.  Their recommendations and assistance greatly appreciated.  Acute kidney injury multifactorial (cardiorenal versus ischemic ATN due to intravascular depletion, hypotension, Arni) Agree with gentle hydration. Currently requiring Levophed to maintain blood pressure- managed by CCM.  Avoid nephrotoxic agents including ACE inhibitors, ARB's, Arni's. Continue to monitor BUN and creatinine Monitor urine output Will consider diuretics once off of Levophed May consider nephrology consult if serum creatinine continues to rise.  Insulin-dependent type 1 diabetes mellitus: Currently hyperglycemic, on insulin pump at home.  Hemoglobin A1c 7.5.  Glucose on admission 313.  Currently managed by CCM.  Had a long discussion with the patient's husband Clair Gulling at bedside he is quite concerned and questions why pacemaker was not implanted during the last hospitalization.  He states that the patient presented to the hospital more sick than she did during her last hospitalization.  Spent greater than 30 minutes reviewing the course of events during her last hospitalization. Explained to him that her conduction disease during her last hospitalization was thought to be ischemia driven and the intrinsic rhythm had improved and she was maintaining normal sinus rhythm with good chronotropic competence prior to discharge.  Therefore subjecting her to an invasive cardiac device (I.e pacemaker) may have not be clinically indicated as per cardiology and EP teams. Clair Gulling was thankful for the explanation and his questions and concerns were addressed to his satisfaction.  Also spoke to [facility] intensivist Dr. Loanne Drilling as well as Dr. Rayann Heman from electrophysiology with regards to plan of care and coordination of care for this patient prior to and during morning rounds.  Greatly appreciate the care provided by Dr. Martinique interventional cardiologist, CCM team, and Dr. Thompson Grayer from cardiac  electrophysiology and taking care of this very pleasant but critically ill patient.  Patient's husband is reassured that he can reach out if any questions or concerns arise and that Dr. Virgina Jock will resume care in the morning.  CRITICAL CARE Performed by: Rex Kras   Total critical care time: 76 minutes   Critical care time was exclusive of separately billable procedures and treating other patients.   Critical care was necessary to treat or prevent imminent or life-threatening deterioration.   Critical care was time spent personally by me on the following activities: development of treatment plan with patient's husband Clair Gulling, CCM attending, cardiac electrophysiologist, as well as nursing, evaluation  of patient's response to treatment, examination of patient, obtaining history from her husband, reviewing ER and EMS records, ordering and performing treatments and interventions, ordering and review of laboratory studies, ordering and review of radiographic studies, pulse oximetry and re-evaluation of patient's condition.  This note was created using a voice recognition software as a result there may be grammatical errors inadvertently enclosed that do not reflect the nature of this encounter. Every attempt is made to correct such errors.  Mechele Claude [facility]  Pager: [phone] Office: [phone] 01/02/2021, 11:57 AM   ADDENDUM:  Repeat Troponin 462 > 505 > 887> 5036 (most recent) Repeat Lactic Acid 4.5 (prior 5.2) EKG at 1322 notes NSR w/o injury pattern.   The rise in troponin most -likely due to supply demand ischemia (due to hypotension, now tachycardia, underlying acidosis, AKI) and recent inferior wall STEMI during last hospitalization. EKG done at 1322 today does not show acute injury pattern. Limited echo notes LVEF 50-55%. Monitor for now.   Mechele Claude [facility]  Pager: [phone] Office: [phone] 01/02/21 2:34 PM

## 2021-01-02 NOTE — Progress Notes (Signed)
Orthopedic Tech Progress Note Patient Details:  Colleen [name] 08-Dec-1965 [id number]  Ortho Devices Type of Ortho Device: Knee Immobilizer Ortho Device/Splint Location: rle Ortho Device/Splint Interventions: Ordered,Adjustment,Application   Post Interventions Patient Tolerated: Well Instructions Provided: Care of device,Adjustment of device   Karolee Stamps 01/02/2021, 5:19 AM

## 2021-01-02 NOTE — Progress Notes (Signed)
NAME:  Colleen [name], MRN:  [id number], DOB:  23-Dec-1965, LOS: 0 ADMISSION DATE:  01/02/2021, CONSULTATION DATE:  4/10 REFERRING MD:  Dr. Eston Mould EDP , CHIEF COMPLAINT:  Heart block  History of Present Illness:  55 year old female with PMH as below, which is significant for DM1 on pump. She was recently admitted 3/27 > 4/7 for late presentation STEMI and was in AV block. She had transvenous pacemaker placed and was given time. AV block did improve and she was taken for [facility]. Multiple areas on occlusion. No PCI done. Plans for stress MRI as an outpatient. She presented to [facility] ED 4/10 early AM. She was having vomiting and lethargy at home and given her recent admit husband was worried for heart attack. Upon arrival was lethargic and bradycardic to the 30s. She was placed on transcutaneous pacing, but required high amperage to maintain capture and was intubated for comfort and airway considerations. She was taken to the cardiac cath lab where transvenous pacemaker was placed. PCCM asked to assist with vent/medical management.   Pertinent  Medical History   has a past medical history of Asthma, Complication of anesthesia, Diabetes mellitus, Diabetic macular edema ([place]), and Seasonal allergies.   Significant Hospital Events: Including procedures, antibiotic start and stop dates in addition to other pertinent events   . 4/10 admit for heart block, intubated. Transvenous pacemaker placed.   Interim History / Subjective:  Overnight TVPM placed. On high dose levo. Able to follow commands. Currently not paced  Objective   Blood pressure 129/62, pulse (!) 110, temperature (!) 94.8 F (34.9 C), temperature source Bladder, resp. rate 18, height 5\' 4"  (1.626 m), weight 98.1 kg, SpO2 100 %.    Vent Mode: PRVC FiO2 (%):  [70 %-100 %] 70 % Set Rate:  [18 bmp-22 bmp] 22 bmp Vt Set:  [430 mL] 430 mL PEEP:  [5 cmH20] 5 cmH20 Plateau Pressure:  [15 cmH20-17 cmH20] 17 cmH20  No intake or output data  in the 24 hours ending 01/02/21 0738 Filed Weights   01/02/21 0207 01/02/21 0500  Weight: 97.1 kg 98.1 kg   Physical Exam: General: Well-appearing, no acute distress HENT: [place], AT, ETT in place Eyes: EOMI, no scleral icterus Respiratory: Clear to auscultation bilaterally.  No crackles, wheezing or rales Cardiovascular: RRR, -M/R/G, no JVD GI: BS+, soft, nontender Extremities:-Edema,-tenderness, bloody dressing and oozing at right groin site Neuro: Sedated, follows commands, moves extremities x 4 GU: Foley in place    Labs/imaging that I have personally reviewed  (right click and "Reselect all SmartList Selections" daily)  Na 128 K 5.1 CO2 17 BUN/Cr 55/377 AG 16 Pho 6.5 Mg 1.9 Trop 462>505 LA 4.9   WBC 21.6 INR 2.1  Glucose 300  CXR 01/02/21 ETT and pacer in place. No infiltrate, effusion or edema  Imaging, labs and test noted above have been reviewed independently by me.  Resolved Hospital Problem list     Assessment & Plan:   Heart block: in the setting of recent MI CAD - Transvenous pacemaker placed by Dr. Martinique.  - Management per cardiology - ASA  Cardiogenic shock secondary to CHB -TVPM in place - Levophed infusion to keep MAP > 35mm Hg. Weaning down currently.  - Cardiology and EP following - Hold plavix in setting of bleed until discuss with Cards this am  Right groin bleed related to TVPM placement - Pressure dressing in place - Trend CBC  Acute hypoxemic respiratory failure: impending due to heart block  and lethargy.  - Full vent support - Follow blood gas - Daily SBT/WUA. Hold on extubation until hemodynamically stable  - RASS goal -1 with PAD protocol  AKI with hyperphosphatemia: cardiorenal vs ATN. Prior to admission, she reported weight gain and started on diuretics - Maintain perfusion with pressors - Monitor UOP/Cr - Consider diuretics when pressors weaned  IDDM with current hyperglycemia - CBG monitoring and SSI  - On insulin pump  at home  Best practice (right click and "Reselect all SmartList Selections" daily)  Diet:  NPO Pain/Anxiety/Delirium protocol (if indicated): Yes (RASS goal -1,-2) VAP protocol (if indicated): Yes DVT prophylaxis: Subcutaneous Heparin GI prophylaxis: PPI Glucose control:  SSI Yes Central venous access:  Yes, and it is still needed Arterial line:  N/A Foley:  N/A Mobility:  bed rest  PT consulted: N/A Last date of multidisciplinary goals of care discussion [ 4/10] Code Status:  full code Disposition: ICU  Labs   CBC: Recent Labs  Lab 12/27/20 0337 12/28/20 0416 12/29/20 0335 01/02/21 0136 01/02/21 0141 01/02/21 0248 01/02/21 0305 01/02/21 0406  WBC 5.8 6.1 5.3 9.5  --   --   --  21.6*  NEUTROABS  --   --   --  6.4  --   --   --   --   HGB 9.6* 9.5* 9.2* 10.6* 11.2* 11.9* 11.9* 11.2*  HCT 29.6* 29.1* 28.1* 35.0* 33.0* 35.0* 35.0* 34.4*  MCV 94.0 93.0 91.8 100.6*  --   --   --  92.7  PLT 92* 99* 103* 178  --   --   --  151    Basic Metabolic Panel: Recent Labs  Lab 12/28/20 0416 12/29/20 0335 12/29/20 0819 12/30/20 0415 01/02/21 0136 01/02/21 0141 01/02/21 0248 01/02/21 0305 01/02/21 0406  NA 133*  --  136 134* 133* 130* 130* 129* 128*  K 4.4  --  3.9 3.8 5.3* 5.1 4.8 4.8 5.1  CL 99  --  100 100 94* 96* 97*  --  95*  CO2 27  --  28 28 15*  --   --   --  17*  GLUCOSE 169*  --  122* 53* 313* 286* 329*  --  377*  BUN 30*  --  29* 27* 55* 57* 54*  --  55*  CREATININE 1.62*  --  1.57* 1.50* 3.34* 3.20* 3.10*  --  3.32*  CALCIUM 8.1*  --  8.1* 8.4* 8.3*  --   --   --  7.8*  MG 2.1 1.9  --  1.9 2.1  --   --   --  1.9  PHOS  --   --   --   --   --   --   --   --  6.5*   GFR: Estimated Creatinine Clearance: 21.5 mL/min (A) (by C-G formula based on SCr of 3.32 mg/dL (H)). Recent Labs  Lab 12/28/20 0416 12/29/20 0335 01/02/21 0136 01/02/21 0406  WBC 6.1 5.3 9.5 21.6*  LATICACIDVEN  --   --   --  4.9*    Liver Function Tests: Recent Labs  Lab 12/27/20 0941  01/02/21 0136  AST 61* 45*  ALT 343* 81*  ALKPHOS 152* 153*  BILITOT 1.9* 1.7*  PROT 5.7* 5.6*  ALBUMIN 2.8* 2.9*   No results for input(s): LIPASE, AMYLASE in the last 168 hours. No results for input(s): AMMONIA in the last 168 hours.  ABG    Component Value Date/Time   PHART 7.306 (L) 12/19/2020  2302   PCO2ART 22.4 (L) 12/19/2020 2302   PO2ART 119 (H) 12/19/2020 2302   HCO3 18.9 (L) 01/02/2021 0305   TCO2 20 (L) 01/02/2021 0305   ACIDBASEDEF 8.0 (H) 01/02/2021 0305   O2SAT 88.0 01/02/2021 0305     Coagulation Profile: Recent Labs  Lab 01/02/21 0136 01/02/21 0406  INR 1.9* 2.1*    Cardiac Enzymes: No results for input(s): CKTOTAL, CKMB, CKMBINDEX, TROPONINI in the last 168 hours.  HbA1C: Hgb A1c MFr Bld  Date/Time Value Ref Range Status  01/02/2021 01:36 AM 7.5 (H) 4.8 - 5.6 % Final    Comment:    (NOTE) Pre diabetes:          5.7%-6.4%  Diabetes:              >6.4%  Glycemic control for   <7.0% adults with diabetes   12/19/2020 11:54 AM 7.8 (H) 4.8 - 5.6 % Final    Comment:    (NOTE) Pre diabetes:          5.7%-6.4%  Diabetes:              >6.4%  Glycemic control for   <7.0% adults with diabetes     CBG: Recent Labs  Lab 12/30/20 0623 12/30/20 0655 12/30/20 0751 01/02/21 0413 01/02/21 0729  GLUCAP 54* 73 194* 366* 381*    Critical care time: 45 minutes    The patient is critically ill with multiple organ systems failure and requires high complexity decision making for assessment and support, frequent evaluation and titration of therapies, application of advanced monitoring technologies and extensive interpretation of multiple databases.   Rodman Pickle, M.D. [facility] Pulmonary/Critical Care Medicine 01/02/2021 7:38 AM   Please see Amion for pager number to reach on-call Pulmonary and Critical Care Team.

## 2021-01-02 NOTE — Progress Notes (Signed)
RT NOTE:   Pt transported from Cath Lab to 2H09 without event. Report given to [facility], RRT.

## 2021-01-02 NOTE — Consult Note (Signed)
Cardiology Consultation:   Patient ID: Colleen [name] MRN: [id number]; DOB: Apr 19, 1966  Admit date: 01/02/2021 Date of Consult: 01/02/2021  PCP:  Orpah Melter, MD   [place]  Cardiologist:  No primary care provider on file.  Advanced Practice Provider:  No care team member to display Electrophysiologist:  None        Patient Profile:   Colleen [name] is a 55 y.o. female with a hx of CAD who is being seen today for the evaluation of Syncope , hypotension and abnormal ECG at the request of Admitting physician.  History of Present Illness:   Ms. Colleen [name] is a 55 y.o.Caucasianfemalewith a PMHx of type 1 IDDM-on insulin pump, hypertension, hyperlipidemia, DC from the hospital on 12/30/2020 after an admission for inferior ST elevation, complicated by complete AV block. Based on her DC summary,  Creatinine was elevated at 4.3 on that admission and subsequently increased to up to 5.3.  Given her late presentation and severe acute kidney injury, coronary angiogram could not be performed immediately. Her CHB resolved after few days of follow up. She had a brief episode of PAF/FLUTTER.  Subsequent coronary angiogram ON 12/24/2020 showed ostial RCA occlusion, likely culprit vessel, as well as CTO of mid left circumflex, and moderate mid LAD disease.  Decision was made to perform stress perfusion MRI with gadolinium contrast on an outpatient basis after recovery from recent MI. Patient was DC home on on 12/30/2020.  When seen in the ER , The patient was intubated. ECG reviewed and showed persistent ST elevation inferior leads, associated with heart block.  Patient started complaining of vomiting and lethargy at home with weakness. EMS were called, and while at home, she blacked out. She was brought to the ER and she was fund hypotensive and bradycardic in the 30's. TCP started, she was intubated, and cardiology were asked to evaluate.    Past Medical History:   Diagnosis Date  . Asthma   . Complication of anesthesia    Psuedocholinerasterase deficiency per pt  . Diabetes mellitus    TYPE II  . Diabetic macular edema (HCC)    Pt gets injections in eyes with [facility]  . Seasonal allergies     Past Surgical History:  Procedure Laterality Date  . BACK SURGERY  2009  . CATARACT SURGERY     BILATERAL  . CESAREAN SECTION     X2  . KNEE SURGERY Right   . LEFT HEART CATH AND CORONARY ANGIOGRAPHY N/A 05/08/2017   Procedure: Left Heart Cath and Coronary Angiography;  Surgeon: Nigel Mormon, MD;  Location: [place] [facility];  Service: Cardiovascular;  Laterality: N/A;  . ORIF ANKLE FRACTURE Left 03/23/2016   Procedure: OPEN REDUCTION INTERNAL FIXATION (ORIF) ANKLE FRACTURE MALLEOLUS  MALUNION WITH TIBIAL AND FIBULAR OSTEOTOMY AND AUTOGRAFT;  Surgeon: Wylene Simmer, MD;  Location: [facility];  Service: Orthopedics;  Laterality: Left;  . RIGHT/LEFT HEART CATH AND CORONARY ANGIOGRAPHY N/A 12/24/2020   Procedure: RIGHT/LEFT HEART CATH AND CORONARY ANGIOGRAPHY;  Surgeon: Nigel Mormon, MD;  Location: [place] [facility];  Service: Cardiovascular;  Laterality: N/A;  . SHOULDER ARTHROSCOPY W/ ROTATOR CUFF REPAIR Left   . TEMPORARY PACEMAKER N/A 12/19/2020   Procedure: TEMPORARY PACEMAKER;  Surgeon: Nigel Mormon, MD;  Location: [place] [facility];  Service: Cardiovascular;  Laterality: N/A;  . TEMPORARY PACEMAKER N/A 12/19/2020   Procedure: TEMPORARY PACEMAKER;  Surgeon: Nigel Mormon, MD;  Location: [place] [facility];  Service: Cardiovascular;  Laterality: N/A;     Home Medications:  Prior to Admission medications   Medication Sig Start Date End Date Taking? Authorizing Provider  albuterol (PROVENTIL HFA;VENTOLIN HFA) 108 (90 Base) MCG/ACT inhaler Inhale 1-2 puffs into the lungs every 6 (six) hours as needed for wheezing or shortness of breath.    [provider]  apixaban (ELIQUIS) 5 MG TABS tablet Take 1  tablet (5 mg total) by mouth 2 (two) times daily. 12/30/20   Patwardhan, Reynold Bowen, MD  aspirin 81 MG EC tablet Take 1 tablet (81 mg total) by mouth daily. Swallow whole. 12/30/20   Patwardhan, Reynold Bowen, MD  Cholecalciferol (VITAMIN D) 2000 units tablet Take 2,000 Units by mouth daily.    [provider]  clopidogrel (PLAVIX) 75 MG tablet Take 1 tablet (75 mg total) by mouth daily. 12/30/20   Patwardhan, Reynold Bowen, MD  ferrous sulfate 325 (65 FE) MG tablet Take 325 mg by mouth daily with breakfast. Patient not taking: No sig reported    [provider]  insulin lispro (HUMALOG) 100 UNIT/ML injection Inject into the skin See admin instructions. Insulin Pump - Mini Medtronic 670G    [provider]  loratadine (CLARITIN) 10 MG tablet Take 1 tablet by mouth daily as needed.    [provider]  nitroGLYCERIN (NITROSTAT) 0.4 MG SL tablet Place 1 tablet (0.4 mg total) under the tongue every 5 (five) minutes x 3 doses as needed for chest pain. 12/29/20   Patwardhan, Reynold Bowen, MD  rosuvastatin (CRESTOR) 10 MG tablet Take 10 mg by mouth at bedtime. 06/17/20   [provider]  sacubitril-valsartan (ENTRESTO) 24-26 MG Take 0.5 tablets by mouth 2 (two) times daily. 01/01/21   Cantwell, Celeste C, PA-C  tobramycin (TOBREX) 0.3 % ophthalmic solution Place 2 drops into both eyes as needed (prior to injections).  12/03/18   [provider]  torsemide (DEMADEX) 20 MG tablet Take 1 tablet (20 mg total) by mouth as directed. One tab in the morning Additional 1 tab in the afternoon if there is leg swelling 12/30/20   Patwardhan, Reynold Bowen, MD    Inpatient Medications: Scheduled Meds: . albuterol  2.5 mg Nebulization Q6H  . aspirin  81 mg Per Tube Daily  . budesonide (PULMICORT) nebulizer solution  0.25 mg Nebulization BID  . chlorhexidine gluconate (MEDLINE KIT)  15 mL Mouth Rinse BID  . Chlorhexidine Gluconate Cloth  6 each Topical Q0600  . cholecalciferol  2,000 Units Per  Tube Daily  . clopidogrel  75 mg Per Tube Daily  . docusate  100 mg Per Tube BID  . fentaNYL (SUBLIMAZE) injection  50 mcg Intravenous Once  . heparin  5,000 Units Subcutaneous Q8H  . insulin aspart  0-15 Units Subcutaneous Q4H  . mouth rinse  15 mL Mouth Rinse 10 times per day  . pantoprazole (PROTONIX) IV  40 mg Intravenous QHS  . polyethylene glycol  17 g Per Tube Daily  . rosuvastatin  10 mg Per Tube QHS   Continuous Infusions: . sodium chloride 999 mL/hr (01/02/21 0201)  . fentaNYL infusion INTRAVENOUS 50 mcg/hr (01/02/21 0429)  . norepinephrine (LEVOPHED) Adult infusion 30 mcg/min (01/02/21 0440)  . propofol (DIPRIVAN) infusion 15 mcg/kg/min (01/02/21 0458)   PRN Meds: docusate, fentaNYL, fentaNYL (SUBLIMAZE) injection, fentaNYL (SUBLIMAZE) injection, midazolam, midazolam, ondansetron (ZOFRAN) IV, polyethylene glycol  Allergies:    Allergies  Allergen Reactions  . Other Other (See Comments)  . Succinylcholine Other (See Comments)  Pseudocholinesterase deficiency    Social History:   Social History   Socioeconomic History  . Marital status: Married    Spouse name: Not on file  . Number of children: 2  . Years of education: Not on file  . Highest education level: Not on file  Occupational History  . Not on file  Tobacco Use  . Smoking status: Former Smoker    Packs/day: 0.25    Years: 2.00    Pack years: 0.50    Types: Cigarettes    Quit date: 01/17/1987    Years since quitting: 33.9  . Smokeless tobacco: Never Used  Vaping Use  . Vaping Use: Never used  Substance and Sexual Activity  . Alcohol use: Yes    Comment: occ  . Drug use: No  . Sexual activity: Yes    Comment: PATIENT'S PARTNER WITH VASECTOMY  Other Topics Concern  . Not on file  Social History Narrative  . Not on file   Social Determinants of Health   Financial Resource Strain: Not on file  Food Insecurity: Not on file  Transportation Needs: Not on file  Physical Activity: Not on file   Stress: Not on file  Social Connections: Not on file  Intimate Partner Violence: Not on file    Family History:    Family History  Problem Relation Age of Onset  . Hypertension Father   . Hypertension Paternal Aunt   . Hypertension Paternal Grandmother   . Hypertension Paternal Grandfather      ROS:  Please see the history of present illness.  Otherwise patient is intubated.   Physical Exam/Data:   Vitals:   01/02/21 0300 01/02/21 0305 01/02/21 0323 01/02/21 0355  BP: 117/65 117/64  (!) 93/55  Pulse: 93 93 93 91  Resp: '17 16 18   ' SpO2:    100%  Weight:      Height:       No intake or output data in the 24 hours ending 01/02/21 0507 Last 3 Weights 01/02/2021 12/30/2020 12/29/2020  Weight (lbs) 214 lb 1.1 oz 214 lb 216 lb 14.4 oz  Weight (kg) 97.1 kg 97.07 kg 98.385 kg     Body mass index is 36.74 kg/m.  General:  Well nourished, well developed, intubated and sedated.  HEENT: normal Lymph: no adenopathy Neck: positive for JVD Endocrine:  No thryomegaly Vascular: No carotid bruits; FA pulses 2+ bilaterally without bruits  Cardiac:  normal S1, S2; RRR; no murmur  Lungs:  clear to auscultation bilaterally, no wheezing, rhonchi or rales  Abd: soft, nontender, no hepatomegaly  Ext: no edema Musculoskeletal:  No deformities Skin: warm and dry   EKG:  The EKG was personally reviewed and demonstrates:  NSR with heart block and ST elevation inferior leads   Relevant CV Studies: Echocardiogram 12/19/2020: 1. Left ventricular ejection fraction, by estimation, is 55 to 60%. The  left ventricle has normal function. The left ventricle demonstrates  regional wall motion abnormalities (see scoring diagram/findings for  description). Left ventricular diastolic  parameters are indeterminate. There is mild hypokinesis of the left  ventricular, basal inferolateral wall.  2. Right ventricular systolic function is moderately reduced. The right  ventricular size is moderately  enlarged. Estimated PASP 33 mmHg.  3. Right atrial size was mildly dilated.  4. Tricuspid valve regurgitation is severe.  5. The inferior vena cava is normal in size with <50% respiratory  variability, suggesting right atrial pressure of 8 mmHg.  6. Comapared to previous study report  in 2018, LV/RV wall motion  abnormalities are new.   Laboratory Data:  High Sensitivity Troponin:   Recent Labs  Lab 12/19/20 0715 12/19/20 1154 01/02/21 0136  TROPONINIHS 75,829* >27,000* 462*     Chemistry Recent Labs  Lab 12/29/20 7416 12/30/20 0415 01/02/21 0136 01/02/21 0141 01/02/21 0248 01/02/21 0305  NA 136 134* 133* 130* 130* 129*  K 3.9 3.8 5.3* 5.1 4.8 4.8  CL 100 100 94* 96* 97*  --   CO2 28 28 15*  --   --   --   GLUCOSE 122* 53* 313* 286* 329*  --   BUN 29* 27* 55* 57* 54*  --   CREATININE 1.57* 1.50* 3.34* 3.20* 3.10*  --   CALCIUM 8.1* 8.4* 8.3*  --   --   --   GFRNONAA 38* 41* 16*  --   --   --   ANIONGAP 8 6 24*  --   --   --     Recent Labs  Lab 12/27/20 0941 01/02/21 0136  PROT 5.7* 5.6*  ALBUMIN 2.8* 2.9*  AST 61* 45*  ALT 343* 81*  ALKPHOS 152* 153*  BILITOT 1.9* 1.7*   Hematology Recent Labs  Lab 12/29/20 0335 01/02/21 0136 01/02/21 0141 01/02/21 0248 01/02/21 0305 01/02/21 0406  WBC 5.3 9.5  --   --   --  21.6*  RBC 3.06* 3.48*  --   --   --  3.71*  HGB 9.2* 10.6*   < > 11.9* 11.9* 11.2*  HCT 28.1* 35.0*   < > 35.0* 35.0* 34.4*  MCV 91.8 100.6*  --   --   --  92.7  MCH 30.1 30.5  --   --   --  30.2  MCHC 32.7 30.3  --   --   --  32.6  RDW 14.7 14.5  --   --   --  14.4  PLT 103* 178  --   --   --  255   < > = values in this interval not displayed.   BNPNo results for input(s): BNP, PROBNP in the last 168 hours.  DDimer No results for input(s): DDIMER in the last 168 hours.   Radiology/Studies:  CARDIAC CATHETERIZATION  Result Date: 01/02/2021 Successful insertion of temporary transvenous Pacemaker. Plan: Per CCM. Primary cardiology to  reconsult EP service for consideration of permanent pacemaker.   DG Chest Port 1 View  Result Date: 01/02/2021 CLINICAL DATA:  Heart block intubation EXAM: PORTABLE CHEST 1 VIEW COMPARISON:  12/30/2020, 12/23/2020 FINDINGS: Interval intubation, tip of the endotracheal tube is just above the carina. Esophageal tube tip below the diaphragm but incompletely visualized. Mild cardiomegaly without edema or focal airspace disease. Aortic atherosclerosis IMPRESSION: Interval intubation with tip of the endotracheal tube just above the carina. Cardiomegaly Electronically Signed   By: Donavan Foil M.D.   On: 01/02/2021 01:51   DG CHEST PORT 1 VIEW  Result Date: 12/30/2020 CLINICAL DATA:  Fever. EXAM: PORTABLE CHEST 1 VIEW COMPARISON:  Chest x-ray 12/23/2020. FINDINGS: Mediastinum hilar structures normal. Borderline cardiomegaly again noted. No pulmonary venous congestion. No focal infiltrate. Mild right base subsegmental atelectasis. No pleural effusion noted on today's exam. No pneumothorax. IMPRESSION: 1. Borderline cardiomegaly again noted. No pulmonary venous congestion. 2. No focal infiltrate. Mild right base subsegmental atelectasis. No pleural effusion noted on today's exam. Electronically Signed   By: Marcello Moores  Register   On: 12/30/2020 05:57     Assessment and Plan:   1. Syncope with  heart block, patient is paced Transcutaneously. Will take to cath lab for a TPM placement. ICU bed.  2. Resp failure secondary to bradycardia and hypotension, sp intubation, followed by critical care team. 3. ST elevation inferior leads, previous cath reported RCA is CTO.Patient was given heparin in the ER. Old ECG and new ones reviewed with Dr Martinique and ER physician. Follow trop.  4. Hypotension, started on levophed. Hold all BP medication.  5. DM Type I, per admitting physician.  6. Hyperlipidemia, on statins.  7. Acute on chronic renal failure, consider consult nephrology per admitting physician.     Risk  Assessment/Risk Scores:            For questions or updates, please contact [place] Please consult [email or web address] for contact info under    Signed, Salina April, MD  01/02/2021 5:07 AM

## 2021-01-02 NOTE — Progress Notes (Signed)
[facility] Progress Note Patient Name: Colleen [name] DOB: 1965/11/15 MRN: [id number]   Date of Service  01/02/2021  HPI/Events of Note  64F with T1DM (w/ insulin pump) who was admitted 3/27-4/7 for late presentation of STEMI c/b AV block. No revascularization performed after diagnostic cath done that admission, AV block improved spontaneously. She was discharged home. She re-presents today with n/v/lethargy and was found to be bradycardic to 30s requiring transcutaneous pacing and in cardiogenic shock. She was intubated for management of the discomfort of transcutaneous pacing. She was then taken to cath lab where transvenous pacemaker was inserted. Now returns to ICU still ventilated/sedated.  On my exam, the patient is intubated and sedated. She is on full vent support Vent Mode: PRVC FiO2 (%):  [70 %-100 %] 70 % Set Rate:  [18 bmp-22 bmp] 22 bmp Vt Set:  [430 mL] 430 mL PEEP:  [5 cmH20] 5 cmH20 Plateau Pressure:  [15 cmH20-17 cmH20] 17 cmH20   She is still on levophed at 30 mcg/min. With this, she is maintaining a MAP of 75-80 so there is room to wean this further (MAP target is 65). She is oxygenating well. Suitably sedated on propofol/fentanyl.   eICU Interventions  Full ventilator support at this time as patient is still in shock as evidenced by lactic acid elevation (first value 4.9 on 0400 hr labs) and levophed requirement.   Trend lactic acid Q3H to ensure downtrending with levophed + pacing.  Once her cardiogenic shock has adequately improved, SAT/SBT will be able to be performed. Eventual extubation should be to immediate NIPPV for its LV afterload reducing effects.  Remainder of plan as outlined in the note by Georgann Housekeeper, NP.      Intervention Category Evaluation Type: New Patient Evaluation  Charlott Rakes 01/02/2021, 5:46 AM

## 2021-01-02 NOTE — Progress Notes (Signed)
  Echocardiogram 2D Echocardiogram has been performed.  Colleen [name] 01/02/2021, 11:44 AM

## 2021-01-02 NOTE — Consult Note (Signed)
ELECTROPHYSIOLOGY CONSULT NOTE    Primary Care Physician: Meyers, Stephen, MD Referring Physician:  Dr Tolia  Admit Date: 01/02/2021  Reason for consultation:  AV block  Colleen [name] is a 55 y.o. female with a h/o DM, CAD, and recent admission for AV block who is readmitted with symptomatic bradycardia.  She was admitted 12/30/20 with inferior STEMI.  She had AV block and AKI at the time.  Both resolved.  She did not receive revascularization at the time.  She did reasonably well and was discharged.    She now returns with hypotension and syncope.  Her husband reports that she became progressively ill yesterday evening.  EMS was called and she was found to be hypotensive and bradycardic (30s).   She was observed to have AV block and required temporary pacing.  She has been intubated.  She is quite ill.  Her husband is at bedside and is very concerned.   Past Medical History:  Diagnosis Date  . Asthma   . Complication of anesthesia    Psuedocholinerasterase deficiency per pt  . Diabetes mellitus    TYPE II  . Diabetic macular edema (HCC)    Pt gets injections in eyes with Eyelea  . Seasonal allergies    Past Surgical History:  Procedure Laterality Date  . BACK SURGERY  2009  . CATARACT SURGERY     BILATERAL  . CESAREAN SECTION     X2  . KNEE SURGERY Right   . LEFT HEART CATH AND CORONARY ANGIOGRAPHY N/A 05/08/2017   Procedure: Left Heart Cath and Coronary Angiography;  Surgeon: Patwardhan, Manish J, MD;  Location: [facility];  Service: Cardiovascular;  Laterality: N/A;  . ORIF ANKLE FRACTURE Left 03/23/2016   Procedure: OPEN REDUCTION INTERNAL FIXATION (ORIF) ANKLE FRACTURE MALLEOLUS  MALUNION WITH TIBIAL AND FIBULAR OSTEOTOMY AND AUTOGRAFT;  Surgeon: John Hewitt, MD;  Location: [facility];  Service: Orthopedics;  Laterality: Left;  . RIGHT/LEFT HEART CATH AND CORONARY ANGIOGRAPHY N/A 12/24/2020   Procedure: RIGHT/LEFT HEART CATH AND CORONARY  ANGIOGRAPHY;  Surgeon: Patwardhan, Manish J, MD;  Location: [facility];  Service: Cardiovascular;  Laterality: N/A;  . SHOULDER ARTHROSCOPY W/ ROTATOR CUFF REPAIR Left   . TEMPORARY PACEMAKER N/A 12/19/2020   Procedure: TEMPORARY PACEMAKER;  Surgeon: Patwardhan, Manish J, MD;  Location: [facility];  Service: Cardiovascular;  Laterality: N/A;  . TEMPORARY PACEMAKER N/A 12/19/2020   Procedure: TEMPORARY PACEMAKER;  Surgeon: Patwardhan, Manish J, MD;  Location: [facility];  Service: Cardiovascular;  Laterality: N/A;    . albuterol  2.5 mg Nebulization Q6H  . aspirin  81 mg Per Tube Daily  . budesonide (PULMICORT) nebulizer solution  0.25 mg Nebulization BID  . chlorhexidine gluconate (MEDLINE KIT)  15 mL Mouth Rinse BID  . Chlorhexidine Gluconate Cloth  6 each Topical Q0600  . cholecalciferol  2,000 Units Per Tube Daily  . docusate  100 mg Per Tube BID  . feeding supplement (PROSource TF)  45 mL Per Tube BID  . feeding supplement (VITAL HIGH PROTEIN)  1,000 mL Per Tube Q24H  . fentaNYL (SUBLIMAZE) injection  50 mcg Intravenous Once  . insulin aspart  0-15 Units Subcutaneous Q4H  . insulin detemir  10 Units Subcutaneous Once  . mouth rinse  15 mL Mouth Rinse 10 times per day  . pantoprazole (PROTONIX) IV  40 mg Intravenous QHS  . polyethylene glycol  17 g Per Tube Daily  . rosuvastatin    10 mg Per Tube QHS   . sodium chloride Stopped (01/02/21 0310)  . fentaNYL infusion INTRAVENOUS 150 mcg/hr (01/02/21 0832)  . norepinephrine (LEVOPHED) Adult infusion 30 mcg/min (01/02/21 0600)  . propofol (DIPRIVAN) infusion 35 mcg/kg/min (01/02/21 0812)    Allergies  Allergen Reactions  . Other Other (See Comments)  . Succinylcholine Other (See Comments)    Pseudocholinesterase deficiency    Social History   Socioeconomic History  . Marital status: Married    Spouse name: Not on file  . Number of children: 2  . Years of education: Not on file  . Highest education level:  Not on file  Occupational History  . Not on file  Tobacco Use  . Smoking status: Former Smoker    Packs/day: 0.25    Years: 2.00    Pack years: 0.50    Types: Cigarettes    Quit date: 01/17/1987    Years since quitting: 33.9  . Smokeless tobacco: Never Used  Vaping Use  . Vaping Use: Never used  Substance and Sexual Activity  . Alcohol use: Yes    Comment: occ  . Drug use: No  . Sexual activity: Yes    Comment: PATIENT'S PARTNER WITH VASECTOMY  Other Topics Concern  . Not on file  Social History Narrative  . Not on file   Social Determinants of Health   Financial Resource Strain: Not on file  Food Insecurity: Not on file  Transportation Needs: Not on file  Physical Activity: Not on file  Stress: Not on file  Social Connections: Not on file  Intimate Partner Violence: Not on file    Family History  Problem Relation Age of Onset  . Hypertension Father   . Hypertension Paternal Aunt   . Hypertension Paternal Grandmother   . Hypertension Paternal Grandfather     ROS- pt unable to provide  Physical Exam: Telemetry:  Sinus with intrinsic conduction, PVCs Vitals:   01/02/21 0630 01/02/21 0645 01/02/21 0700 01/02/21 0753  BP: (!) 118/58 117/60 129/62   Pulse: (!) 108 (!) 104 (!) 110   Resp:    (!) 22  Temp:      TempSrc:      SpO2: 100% 100% 100% 100%  Weight:      Height:        GEN- The patient is ill appearing, sedated and intubated Head- normocephalic, atraumatic Eyes-  Sclera clear, conjunctiva pink Oropharynx- ETT Neck- supple,  Lungs-  intubated Heart- Regular rate and rhythm  GI- soft  Extremities- no clubbing, cyanosis, or edema,  + R femoral groin sheath with venous oozing around sheath  EKG-  Labs:   Lab Results  Component Value Date   WBC 21.6 (H) 01/02/2021   HGB 11.2 (L) 01/02/2021   HCT 34.4 (L) 01/02/2021   MCV 92.7 01/02/2021   PLT 255 01/02/2021    Recent Labs  Lab 01/02/21 0136 01/02/21 0141 01/02/21 0406  NA 133*   < >  128*  K 5.3*   < > 5.1  CL 94*   < > 95*  CO2 15*  --  17*  BUN 55*   < > 55*  CREATININE 3.34*   < > 3.32*  CALCIUM 8.3*  --  7.8*  PROT 5.6*  --   --   BILITOT 1.7*  --   --   ALKPHOS 153*  --   --   ALT 81*  --   --   AST 45*  --   --     GLUCOSE 313*   < > 377*   < > = values in this interval not displayed.   Lab Results  Component Value Date   TROPONINI 0.03 ([place]) 03/22/2017     Radiology:  Pacing wire in good position (personally reviewed)  EMS run sheet (Media tab) reveals mobitz I second degree AV block with bradycardia (HR 30s) and subsequent V pacing)  Echo 12/23/20:  EF 50-55%, severe TR  ASSESSMENT AND PLAN:   1. Second degree AV block The patient has symptomatic bradycardia.  This is recurrent, without reversible cause.  Likely initially post MI, though she is not felt to be a candidate for revascularization currently.   I would therefore recommend pacemaker implantation at this time.  Risks, benefits, alternatives to pacemaker implantation were discussed in detail with the patients spouse today.  He understands that the risks include but are not limited to bleeding, infection, pneumothorax, perforation, tamponade, vascular damage, renal failure, MI, stroke, death,  and lead dislodgement and wishes to proceed.   We will allow her eliquis to washout today.   In addition, hopefully we can get her extubated today.  Hopefully we can proceed to pacing tomorrow if she is more stable.  Her temporary pacing electrode threshold is 77m.  She is programmed VVI 60 bpm with output of 564m  She is currently intrinsically conducting.  2. CAD S/p recent inferior STEMI without revascularization. Her prognosis is guarded.  She has high morbidity/ mortality post MI, particularly without revascularization.    EP to follow Dr LaQuentin Oreo see in AM.   JaThompson GrayerMD 01/02/2021  9:05 AM

## 2021-01-02 NOTE — Progress Notes (Signed)
Brief Nutrition Note RD working remotely.   Consult received for enteral/tube feeding initiation and management.  Adult Enteral Nutrition Protocol initiated. Full assessment to follow.  Admitting Dx: Complete heart block (HCC) [I44.2]  Body mass index is 37.12 kg/m. Pt meets criteria for obesity based on current BMI.  Labs:  Recent Labs  Lab 12/30/20 0415 01/02/21 0136 01/02/21 0141 01/02/21 0248 01/02/21 0305 01/02/21 0406  NA 134* 133* 130* 130* 129* 128*  K 3.8 5.3* 5.1 4.8 4.8 5.1  CL 100 94* 96* 97*  --  95*  CO2 28 15*  --   --   --  17*  BUN 27* 55* 57* 54*  --  55*  CREATININE 1.50* 3.34* 3.20* 3.10*  --  3.32*  CALCIUM 8.4* 8.3*  --   --   --  7.8*  MG 1.9 2.1  --   --   --  1.9  PHOS  --   --   --   --   --  6.5*  GLUCOSE 53* 313* 286* 329*  --  377*        Jarome Matin, MS, RD, LDN, CNSC Inpatient Clinical Dietitian RD pager # available in AMION  After hours/weekend pager # available in [facility]

## 2021-01-02 NOTE — Procedures (Signed)
Extubation Procedure Note  Patient Details:   Name: Colleen [name] DOB: 1966/02/26 MRN: [id number]   Airway Documentation:    Vent end date: 01/02/21 Vent end time: 1831   Evaluation  O2 sats: stable throughout Complications: No apparent complications Patient did tolerate procedure well. Bilateral Breath Sounds: Clear,Diminished   Yes, pt could speak.  Pt extubated to 4 l/m [place] per physician order.  Earney Navy 01/02/2021, 6:36 PM

## 2021-01-02 NOTE — ED Notes (Addendum)
Patient being paced at 140Mamps and rate at 70 and capturing

## 2021-01-02 NOTE — ED Triage Notes (Signed)
Patient with history of MI, patient was alert and responsive with EMS prior to transport.  Patient became lethargic, unresponsive and bradycardic in the 30's.  Patient is being paced upon arrival to the ED, was given Versed en route.  Patient was given fluid and an epi drip started.  Patient arrives being bagged and being paced at this time.

## 2021-01-02 NOTE — ED Notes (Signed)
Called office for Nigel Mormon, MD per provider, Dr. Terri Skains is on call tonight.

## 2021-01-02 NOTE — Plan of Care (Signed)

## 2021-01-02 NOTE — Progress Notes (Signed)
Received a page from ER at 136am regarding Colleen [name].   I returned the page and spoke to Dr. Leonette Monarch who informs me that Code STEMI was activated and Dr. Martinique was notified.   Will follow up.   Rex Kras, [place], [facility]  Pager: [phone] Office: [phone]

## 2021-01-02 NOTE — ED Notes (Addendum)
To Cath lab with RN, RT and NT.

## 2021-01-02 NOTE — H&P (Signed)
NAME:  Colleen [name], MRN:  [id number], DOB:  21-Sep-1966, LOS: 0 ADMISSION DATE:  01/02/2021, CONSULTATION DATE:  4/10 REFERRING MD:  Dr. Eston Mould EDP , CHIEF COMPLAINT:  Heart block  History of Present Illness:  55 year old female with PMH as below, which is significant for DM1 on pump. She was recently admitted 3/27 > 4/7 for late presentation STEMI and was in AV block. She had transvenous pacemaker placed and was given time. AV block did improve and she was taken for [facility]. Multiple areas on occlusion. No PCI done. Plans for stress MRI as an outpatient. She presented to [facility] ED 4/10 early AM. She was having vomiting and lethargy at home and given her recent admit husband was worried for heart attack. Upon arrival was lethargic and bradycardic to the 30s. She was placed on transcutaneous pacing, but required high amperage to maintain capture and was intubated for comfort and airway considerations. She was taken to the cardiac cath lab where transvenous pacemaker was placed. PCCM asked to assist with vent/medical management.   Pertinent  Medical History   has a past medical history of Asthma, Complication of anesthesia, Diabetes mellitus, Diabetic macular edema ([place]), and Seasonal allergies.   Significant Hospital Events: Including procedures, antibiotic start and stop dates in addition to other pertinent events   . 4/10 admit for heart block, intubated. Transvenous pacemaker placed.   Interim History / Subjective:    Objective   Blood pressure 117/64, pulse 93, resp. rate 18, height 5\' 4"  (1.626 m), weight 97.1 kg, SpO2 95 %.    Vent Mode: PRVC FiO2 (%):  [70 %-100 %] 70 % Set Rate:  [18 bmp-22 bmp] 22 bmp Vt Set:  [430 mL] 430 mL PEEP:  [5 cmH20] 5 cmH20 Plateau Pressure:  [15 cmH20-17 cmH20] 17 cmH20  No intake or output data in the 24 hours ending 01/02/21 0353 Filed Weights   01/02/21 0207  Weight: 97.1 kg    Examination: General: Obese female on vent HENT: [place]/AT,  PERRL, no JVD Lungs: Clear bilateral breath sounds Cardiovascular: RRR, no MRG. Spontaneous rate 90.  Abdomen: Soft, non-tender, non-distended Extremities: No acute deformity, no edema.  Neuro: Sedated   Labs/imaging that I have personally reviewed  (right click and "Reselect all SmartList Selections" daily)  Na 130 GLucose 329 Creatinine 3.1 Hemoglobin 11.9  Resolved Hospital Problem list     Assessment & Plan:   Heart block: in the setting of recent MI CAD - Transvenous pacemaker placed by Dr. Martinique.  - Management per cardiology  Acute hypoxemic respiratory failure: impending due to heart block and lethargy.  - Full vent support - Follow blood gas - CXR to evaluate tube positioning.  - SAT and SBT in AM  Shock: cardiogenic shock vs sedation related. Recent echo with LVEF 50-55% - Levophed infusion to keep MAP > 63mm Hg. Weaning down currently.   AKI - gentle hydration - Repeat BMP improving  IDDM with current hyperglycemia - CBG monitoring and SSI  - On insulin pump at home  Best practice (right click and "Reselect all SmartList Selections" daily)  Diet:  NPO Pain/Anxiety/Delirium protocol (if indicated): Yes (RASS goal -1,-2) VAP protocol (if indicated): Yes DVT prophylaxis: Subcutaneous Heparin GI prophylaxis: PPI Glucose control:  SSI Yes Central venous access:  Yes, and it is still needed Arterial line:  N/A Foley:  N/A Mobility:  bed rest  PT consulted: N/A Last date of multidisciplinary goals of care discussion [ 4/10] Code Status:  full code Disposition: ICU  Labs   CBC: Recent Labs  Lab 12/27/20 0337 12/28/20 0416 12/29/20 0335 01/02/21 0136 01/02/21 0141 01/02/21 0248 01/02/21 0305  WBC 5.8 6.1 5.3 9.5  --   --   --   NEUTROABS  --   --   --  6.4  --   --   --   HGB 9.6* 9.5* 9.2* 10.6* 11.2* 11.9* 11.9*  HCT 29.6* 29.1* 28.1* 35.0* 33.0* 35.0* 35.0*  MCV 94.0 93.0 91.8 100.6*  --   --   --   PLT 92* 99* 103* 178  --   --   --      Basic Metabolic Panel: Recent Labs  Lab 12/27/20 0337 12/28/20 0416 12/29/20 0335 12/29/20 0819 12/30/20 0415 01/02/21 0136 01/02/21 0141 01/02/21 0248 01/02/21 0305  NA 132* 133*  --  136 134* 133* 130* 130* 129*  K 4.6 4.4  --  3.9 3.8 5.3* 5.1 4.8 4.8  CL 102 99  --  100 100 94* 96* 97*  --   CO2 23 27  --  28 28 15*  --   --   --   GLUCOSE 202* 169*  --  122* 53* 313* 286* 329*  --   BUN 32* 30*  --  29* 27* 55* 57* 54*  --   CREATININE 1.48* 1.62*  --  1.57* 1.50* 3.34* 3.20* 3.10*  --   CALCIUM 8.0* 8.1*  --  8.1* 8.4* 8.3*  --   --   --   MG 2.6* 2.1 1.9  --  1.9 2.1  --   --   --    GFR: Estimated Creatinine Clearance: 22.9 mL/min (A) (by C-G formula based on SCr of 3.1 mg/dL (H)). Recent Labs  Lab 12/27/20 0337 12/28/20 0416 12/29/20 0335 01/02/21 0136  WBC 5.8 6.1 5.3 9.5    Liver Function Tests: Recent Labs  Lab 12/27/20 0941 01/02/21 0136  AST 61* 45*  ALT 343* 81*  ALKPHOS 152* 153*  BILITOT 1.9* 1.7*  PROT 5.7* 5.6*  ALBUMIN 2.8* 2.9*   No results for input(s): LIPASE, AMYLASE in the last 168 hours. No results for input(s): AMMONIA in the last 168 hours.  ABG    Component Value Date/Time   PHART 7.306 (L) 12/19/2020 2302   PCO2ART 22.4 (L) 12/19/2020 2302   PO2ART 119 (H) 12/19/2020 2302   HCO3 18.9 (L) 01/02/2021 0305   TCO2 20 (L) 01/02/2021 0305   ACIDBASEDEF 8.0 (H) 01/02/2021 0305   O2SAT 88.0 01/02/2021 0305     Coagulation Profile: Recent Labs  Lab 01/02/21 0136  INR 1.9*    Cardiac Enzymes: No results for input(s): CKTOTAL, CKMB, CKMBINDEX, TROPONINI in the last 168 hours.  HbA1C: Hgb A1c MFr Bld  Date/Time Value Ref Range Status  01/02/2021 01:36 AM 7.5 (H) 4.8 - 5.6 % Final    Comment:    (NOTE) Pre diabetes:          5.7%-6.4%  Diabetes:              >6.4%  Glycemic control for   <7.0% adults with diabetes   12/19/2020 11:54 AM 7.8 (H) 4.8 - 5.6 % Final    Comment:    (NOTE) Pre diabetes:           5.7%-6.4%  Diabetes:              >6.4%  Glycemic control for   <7.0% adults with diabetes  CBG: Recent Labs  Lab 12/30/20 0419 12/30/20 0523 12/30/20 0623 12/30/20 0655 12/30/20 0751  GLUCAP 60* 91 54* 73 194*    Review of Systems:   Patient is encephalopathic and/or intubated. Therefore history has been obtained from chart review.    Past Medical History:  She,  has a past medical history of Asthma, Complication of anesthesia, Diabetes mellitus, Diabetic macular edema ([place]), and Seasonal allergies.   Surgical History:   Past Surgical History:  Procedure Laterality Date  . BACK SURGERY  2009  . CATARACT SURGERY     BILATERAL  . CESAREAN SECTION     X2  . KNEE SURGERY Right   . LEFT HEART CATH AND CORONARY ANGIOGRAPHY N/A 05/08/2017   Procedure: Left Heart Cath and Coronary Angiography;  Surgeon: Nigel Mormon, MD;  Location: [place] [facility];  Service: Cardiovascular;  Laterality: N/A;  . ORIF ANKLE FRACTURE Left 03/23/2016   Procedure: OPEN REDUCTION INTERNAL FIXATION (ORIF) ANKLE FRACTURE MALLEOLUS  MALUNION WITH TIBIAL AND FIBULAR OSTEOTOMY AND AUTOGRAFT;  Surgeon: Wylene Simmer, MD;  Location: [place];  Service: Orthopedics;  Laterality: Left;  . RIGHT/LEFT HEART CATH AND CORONARY ANGIOGRAPHY N/A 12/24/2020   Procedure: RIGHT/LEFT HEART CATH AND CORONARY ANGIOGRAPHY;  Surgeon: Nigel Mormon, MD;  Location: [place] [facility];  Service: Cardiovascular;  Laterality: N/A;  . SHOULDER ARTHROSCOPY W/ ROTATOR CUFF REPAIR Left   . TEMPORARY PACEMAKER N/A 12/19/2020   Procedure: TEMPORARY PACEMAKER;  Surgeon: Nigel Mormon, MD;  Location: [place] [facility];  Service: Cardiovascular;  Laterality: N/A;  . TEMPORARY PACEMAKER N/A 12/19/2020   Procedure: TEMPORARY PACEMAKER;  Surgeon: Nigel Mormon, MD;  Location: [place] [facility];  Service: Cardiovascular;  Laterality: N/A;     Social History:   reports that she quit smoking  about 33 years ago. Her smoking use included cigarettes. She has a 0.50 pack-year smoking history. She has never used smokeless tobacco. She reports current alcohol use. She reports that she does not use drugs.   Family History:  Her family history includes Hypertension in her father, paternal aunt, paternal grandfather, and paternal grandmother.   Allergies Allergies  Allergen Reactions  . Other Other (See Comments)  . Succinylcholine Other (See Comments)    Pseudocholinesterase deficiency     Home Medications  Prior to Admission medications   Medication Sig Start Date End Date Taking? Authorizing Provider  albuterol (PROVENTIL HFA;VENTOLIN HFA) 108 (90 Base) MCG/ACT inhaler Inhale 1-2 puffs into the lungs every 6 (six) hours as needed for wheezing or shortness of breath.    [provider]  apixaban (ELIQUIS) 5 MG TABS tablet Take 1 tablet (5 mg total) by mouth 2 (two) times daily. 12/30/20   Patwardhan, Reynold Bowen, MD  aspirin 81 MG EC tablet Take 1 tablet (81 mg total) by mouth daily. Swallow whole. 12/30/20   Patwardhan, Reynold Bowen, MD  Cholecalciferol (VITAMIN D) 2000 units tablet Take 2,000 Units by mouth daily.    [provider]  clopidogrel (PLAVIX) 75 MG tablet Take 1 tablet (75 mg total) by mouth daily. 12/30/20   Patwardhan, Reynold Bowen, MD  ferrous sulfate 325 (65 FE) MG tablet Take 325 mg by mouth daily with breakfast. Patient not taking: No sig reported    [provider]  insulin lispro (HUMALOG) 100 UNIT/ML injection Inject into the skin See admin instructions. Insulin Pump - Mini Medtronic 670G    [provider]  loratadine (CLARITIN) 10 MG tablet  Take 1 tablet by mouth daily as needed.    [provider]  nitroGLYCERIN (NITROSTAT) 0.4 MG SL tablet Place 1 tablet (0.4 mg total) under the tongue every 5 (five) minutes x 3 doses as needed for chest pain. 12/29/20   Patwardhan, Reynold Bowen, MD  rosuvastatin (CRESTOR) 10 MG tablet Take 10 mg by  mouth at bedtime. 06/17/20   [provider]  sacubitril-valsartan (ENTRESTO) 24-26 MG Take 0.5 tablets by mouth 2 (two) times daily. 01/01/21   Cantwell, Celeste C, PA-C  tobramycin (TOBREX) 0.3 % ophthalmic solution Place 2 drops into both eyes as needed (prior to injections).  12/03/18   [provider]  torsemide (DEMADEX) 20 MG tablet Take 1 tablet (20 mg total) by mouth as directed. One tab in the morning Additional 1 tab in the afternoon if there is leg swelling 12/30/20   Nigel Mormon, MD     Critical care time: 46 minutes     Georgann Housekeeper, AGACNP-BC [place] for personal pager PCCM on call pager [phone] until 7pm. Please call Elink 7p-7a. [phone]  01/02/2021 4:42 AM

## 2021-01-02 NOTE — Progress Notes (Signed)
Upon shift assessment, this RN noted pt to be oozing from right femoral TVP site. Loanne Drilling MD and Allred MD notified during AM rounds at bedside. Pressure dressing applied. DP/PT pulses doppler and present before and after pressure dressing placed. Plan to go for PPM per Allred MD. Creatinine 3.3 this AM. CCM at bedside and aware. Patient is currently overriding TVP with intrinsic rate 106. Lactic acid repeat resulted 5.2. Loanne Drilling MD notified at bedside. Will continue to monitor patient.   Alma Friendly RN

## 2021-01-03 ENCOUNTER — Other Ambulatory Visit: Payer: Self-pay

## 2021-01-03 ENCOUNTER — Inpatient Hospital Stay (HOSPITAL_COMMUNITY): Payer: 59

## 2021-01-03 ENCOUNTER — Encounter (HOSPITAL_COMMUNITY)
Admission: EM | Disposition: A | Discharge status: Home / Self Care | Payer: Self-pay | Source: Home / Self Care | Attending: Critical Care Medicine

## 2021-01-03 ENCOUNTER — Encounter (HOSPITAL_COMMUNITY): Payer: Self-pay | Admitting: Cardiology

## 2021-01-03 DIAGNOSIS — I442 Atrioventricular block, complete: Secondary | ICD-10-CM | POA: Diagnosis not present

## 2021-01-03 DIAGNOSIS — I2510 Atherosclerotic heart disease of native coronary artery without angina pectoris: Secondary | ICD-10-CM | POA: Diagnosis not present

## 2021-01-03 DIAGNOSIS — R570 Cardiogenic shock: Secondary | ICD-10-CM | POA: Diagnosis not present

## 2021-01-03 DIAGNOSIS — N179 Acute kidney failure, unspecified: Secondary | ICD-10-CM | POA: Diagnosis not present

## 2021-01-03 LAB — GLUCOSE, CAPILLARY
Glucose-Capillary: 207 mg/dL — ABNORMAL HIGH (ref 70–99)
Glucose-Capillary: 145 mg/dL — ABNORMAL HIGH (ref 70–99)
Glucose-Capillary: 115 mg/dL — ABNORMAL HIGH (ref 70–99)
Glucose-Capillary: 92 mg/dL (ref 70–99)
Glucose-Capillary: 120 mg/dL — ABNORMAL HIGH (ref 70–99)

## 2021-01-03 LAB — CBC
HCT: 28.6 % — ABNORMAL LOW (ref 36.0–46.0)
Hemoglobin: 9.3 g/dL — ABNORMAL LOW (ref 12.0–15.0)
MCH: 30 pg (ref 26.0–34.0)
MCHC: 32.5 g/dL (ref 30.0–36.0)
MCV: 92.3 fL (ref 80.0–100.0)
Platelets: 192 10*3/uL (ref 150–400)
RBC: 3.1 MIL/uL — ABNORMAL LOW (ref 3.87–5.11)
RDW: 14.5 % (ref 11.5–15.5)
WBC: 15.1 10*3/uL — ABNORMAL HIGH (ref 4.0–10.5)
nRBC: 0 % (ref 0.0–0.2)

## 2021-01-03 LAB — BASIC METABOLIC PANEL
Anion gap: 12 (ref 5–15)
BUN: 60 mg/dL — ABNORMAL HIGH (ref 6–20)
CO2: 20 mmol/L — ABNORMAL LOW (ref 22–32)
Calcium: 7.7 mg/dL — ABNORMAL LOW (ref 8.9–10.3)
Chloride: 98 mmol/L (ref 98–111)
Creatinine, Ser: 2.73 mg/dL — ABNORMAL HIGH (ref 0.44–1.00)
GFR, Estimated: 20 mL/min — ABNORMAL LOW (ref >60)
Glucose, Bld: 209 mg/dL — ABNORMAL HIGH (ref 70–99)
Potassium: 3.8 mmol/L (ref 3.5–5.1)
Sodium: 130 mmol/L — ABNORMAL LOW (ref 135–145)

## 2021-01-03 LAB — PHOSPHORUS: Phosphorus: 4.9 mg/dL — ABNORMAL HIGH (ref 2.5–4.6)

## 2021-01-03 LAB — COOXEMETRY PANEL
Carboxyhemoglobin: 1.3 % (ref 0.5–1.5)
Methemoglobin: 1.1 % (ref 0.0–1.5)
O2 Saturation: 62.6 %
Total hemoglobin: 9.2 g/dL — ABNORMAL LOW (ref 12.0–16.0)

## 2021-01-03 LAB — APTT: aPTT: 153 seconds — ABNORMAL HIGH (ref 24–36)

## 2021-01-03 LAB — HEPARIN LEVEL (UNFRACTIONATED): Heparin Unfractionated: >2.2 IU/mL — ABNORMAL HIGH (ref 0.30–0.70)

## 2021-01-03 LAB — BRAIN NATRIURETIC PEPTIDE: B Natriuretic Peptide: 1557.4 pg/mL — ABNORMAL HIGH (ref 0.0–100.0)

## 2021-01-03 LAB — LACTIC ACID, PLASMA: Lactic Acid, Venous: 1.8 mmol/L (ref 0.5–1.9)

## 2021-01-03 LAB — TRIGLYCERIDES: Triglycerides: 80 mg/dL (ref <150)

## 2021-01-03 LAB — MAGNESIUM: Magnesium: 1.7 mg/dL (ref 1.7–2.4)

## 2021-01-03 SURGERY — PACEMAKER IMPLANT
Anesthesia: LOCAL

## 2021-01-03 MED ORDER — SODIUM CHLORIDE 0.9 % IV SOLN
2.0000 g | INTRAVENOUS | Status: DC
  Administered 2021-01-03: 2 g via INTRAVENOUS
  Filled 2021-01-03: qty 2

## 2021-01-03 MED ORDER — INSULIN ASPART 100 UNIT/ML ~~LOC~~ SOLN
0.0000 [IU] | Freq: Every day | SUBCUTANEOUS | Status: DC
  Administered 2021-01-04: 3 [IU] via SUBCUTANEOUS
  Administered 2021-01-05: 2 [IU] via SUBCUTANEOUS
  Administered 2021-01-06 – 2021-01-07 (×2): 3 [IU] via SUBCUTANEOUS
  Administered 2021-01-09: 4 [IU] via SUBCUTANEOUS

## 2021-01-03 MED ORDER — SODIUM CHLORIDE 0.9 % IV SOLN: INTRAVENOUS | Status: DC

## 2021-01-03 MED ORDER — INSULIN DETEMIR 100 UNIT/ML ~~LOC~~ SOLN
8.0000 [IU] | Freq: Two times a day (BID) | SUBCUTANEOUS | Status: DC
  Administered 2021-01-03 – 2021-01-06 (×7): 8 [IU] via SUBCUTANEOUS
  Filled 2021-01-03 (×8): qty 0.08

## 2021-01-03 MED ORDER — INSULIN ASPART 100 UNIT/ML ~~LOC~~ SOLN
0.0000 [IU] | Freq: Three times a day (TID) | SUBCUTANEOUS | Status: DC
  Administered 2021-01-04: 2 [IU] via SUBCUTANEOUS
  Administered 2021-01-04: 3 [IU] via SUBCUTANEOUS
  Administered 2021-01-04: 5 [IU] via SUBCUTANEOUS
  Administered 2021-01-05: 1 [IU] via SUBCUTANEOUS
  Administered 2021-01-05 – 2021-01-06 (×2): 3 [IU] via SUBCUTANEOUS
  Administered 2021-01-06: 2 [IU] via SUBCUTANEOUS
  Administered 2021-01-06: 3 [IU] via SUBCUTANEOUS
  Administered 2021-01-07: 2 [IU] via SUBCUTANEOUS
  Administered 2021-01-07: 3 [IU] via SUBCUTANEOUS
  Administered 2021-01-07: 5 [IU] via SUBCUTANEOUS
  Administered 2021-01-08: 2 [IU] via SUBCUTANEOUS
  Administered 2021-01-08: 1 [IU] via SUBCUTANEOUS
  Administered 2021-01-08: 3 [IU] via SUBCUTANEOUS
  Administered 2021-01-09: 5 [IU] via SUBCUTANEOUS
  Administered 2021-01-09: 1 [IU] via SUBCUTANEOUS

## 2021-01-03 MED ORDER — VANCOMYCIN HCL 2000 MG/400ML IV SOLN
2000.0000 mg | Freq: Once | INTRAVENOUS | Status: AC
  Administered 2021-01-03: 2000 mg via INTRAVENOUS
  Filled 2021-01-03: qty 400

## 2021-01-03 MED ORDER — HEPARIN (PORCINE) 25000 UT/250ML-% IV SOLN
1550.0000 [IU]/h | INTRAVENOUS | Status: DC
  Administered 2021-01-03: 1550 [IU]/h via INTRAVENOUS
  Filled 2021-01-03: qty 250

## 2021-01-03 MED ORDER — VANCOMYCIN HCL 1000 MG/200ML IV SOLN: 1000.0000 mg | INTRAVENOUS | Status: DC

## 2021-01-03 MED ORDER — PANTOPRAZOLE SODIUM 40 MG PO TBEC
40.0000 mg | DELAYED_RELEASE_TABLET | Freq: Every day | ORAL | Status: DC
  Administered 2021-01-03 – 2021-01-05 (×3): 40 mg via ORAL
  Filled 2021-01-03 (×3): qty 1

## 2021-01-03 MED ORDER — INSULIN ASPART 100 UNIT/ML ~~LOC~~ SOLN
3.0000 [IU] | Freq: Three times a day (TID) | SUBCUTANEOUS | Status: DC
  Administered 2021-01-03 – 2021-01-05 (×5): 3 [IU] via SUBCUTANEOUS

## 2021-01-03 MED ORDER — ACETAMINOPHEN 325 MG PO TABS
650.0000 mg | ORAL_TABLET | Freq: Four times a day (QID) | ORAL | Status: DC | PRN
  Administered 2021-01-03 – 2021-01-04 (×2): 650 mg via ORAL
  Filled 2021-01-03 (×2): qty 2

## 2021-01-03 MED ORDER — MAGNESIUM SULFATE 2 GM/50ML IV SOLN
2.0000 g | Freq: Once | INTRAVENOUS | Status: AC
  Administered 2021-01-03: 2 g via INTRAVENOUS
  Filled 2021-01-03: qty 50

## 2021-01-03 MED ORDER — ALBUTEROL SULFATE (2.5 MG/3ML) 0.083% IN NEBU
2.5000 mg | INHALATION_SOLUTION | Freq: Four times a day (QID) | RESPIRATORY_TRACT | Status: DC | PRN
Start: 2021-01-03 — End: 2021-01-05
  Administered 2021-01-05: 2.5 mg via RESPIRATORY_TRACT
  Filled 2021-01-03: qty 3

## 2021-01-03 MED ORDER — ACETAMINOPHEN 500 MG PO TABS
500.0000 mg | ORAL_TABLET | Freq: Once | ORAL | Status: AC
  Administered 2021-01-03: 500 mg via ORAL
  Filled 2021-01-03: qty 1

## 2021-01-03 MED ORDER — POTASSIUM CHLORIDE CRYS ER 20 MEQ PO TBCR
20.0000 meq | EXTENDED_RELEASE_TABLET | Freq: Once | ORAL | Status: AC
  Administered 2021-01-03: 20 meq via ORAL
  Filled 2021-01-03: qty 1

## 2021-01-03 MED ORDER — HEPARIN (PORCINE) 25000 UT/250ML-% IV SOLN
1200.0000 [IU]/h | INTRAVENOUS | Status: AC
  Administered 2021-01-04: 1350 [IU]/h via INTRAVENOUS
  Administered 2021-01-05: 1200 [IU]/h via INTRAVENOUS
  Filled 2021-01-03 (×3): qty 250

## 2021-01-03 MED ORDER — INSULIN ASPART 100 UNIT/ML ~~LOC~~ SOLN: 0.0000 [IU] | Freq: Three times a day (TID) | SUBCUTANEOUS | Status: DC

## 2021-01-03 NOTE — Progress Notes (Signed)
ANTICOAGULATION CONSULT NOTE - Initial Consult  Pharmacy Consult for IV heparin Indication: chest pain/ACS, also on Eliquis PTA for episode of afib last admit  Allergies  Allergen Reactions  . Other Other (See Comments)  . Succinylcholine Other (See Comments)    Pseudocholinesterase deficiency    Patient Measurements: Height: 5\' 4"  (162.6 cm) Weight: 105 kg (231 lb 7.7 oz) IBW/kg (Calculated) : 54.7 Heparin Dosing Weight: 77 kg  Vital Signs: Temp: 100.4 F (38 C) (04/11 0400) Temp Source: Bladder (04/11 0400) BP: 112/52 (04/11 1100) Pulse Rate: 82 (04/11 1100)  Labs: Recent Labs    01/02/21 0136 01/02/21 0141 01/02/21 0248 01/02/21 0305 01/02/21 0406 01/02/21 0921 01/02/21 1222 01/03/21 0527  HGB 10.6*   < > 11.9*   < > 11.2*  --  10.3* 9.3*  HCT 35.0*   < > 35.0*   < > 34.4*  --  32.7* 28.6*  PLT 178  --   --   --  255  --  237 192  APTT 34  --   --   --  112*  --   --   --   LABPROT 21.4*  --   --   --  22.5*  --   --   --   INR 1.9*  --   --   --  2.1*  --   --   --   CREATININE 3.34*   < > 3.10*  --  3.32*  --   --  2.73*  TROPONINIHS 462*  --   --   --  505* 887* 5,036*  --    < > = values in this interval not displayed.    Estimated Creatinine Clearance: 27.2 mL/min (A) (by C-G formula based on SCr of 2.73 mg/dL (H)).   Medical History: Past Medical History:  Diagnosis Date  . Asthma   . Complication of anesthesia    Psuedocholinerasterase deficiency per pt  . Diabetes mellitus    TYPE II  . Diabetic macular edema (HCC)    Pt gets injections in eyes with Eyelea  . Seasonal allergies     Medications:  Infusions:  . sodium chloride Stopped (01/02/21 0310)  . sodium chloride    . sodium chloride    . ceFEPime (MAXIPIME) IV Stopped (01/03/21 1002)  . heparin    . magnesium sulfate bolus IVPB 50 mL/hr at 01/03/21 1100  . norepinephrine (LEVOPHED) Adult infusion 7 mcg/min (01/03/21 1100)  . vancomycin 200 mL/hr at 01/03/21 1100     Assessment: 55 yo female with recent Eliquis that was started last admission after an episode of afib.  Eliquis held so far this admission.  Now pharmacy asked to start IV heparin for r/o ACS with rising troponin.  Some bleeding from IV sites yesterday, improved with thrombipad.  During last recent admission, heparin level was therapeutic on heparin at 1650 units/hr (at higher end of range).  Goal of Therapy:  Heparin level 0.3-0.7 units/ml Monitor platelets by anticoagulation protocol: Yes   Plan:  1. Start IV Heparin at 1550 units/hr 2. Check heparin level 8 hrs after gtt starts. 3. Daily heparin level and CBC.  [place] Crane, Roylene Reason, [facility] Clinical Pharmacist  01/03/2021 11:32 AM   [facility] pharmacy phone numbers are listed on [email or web address]

## 2021-01-03 NOTE — Progress Notes (Addendum)
Progress Note  Patient Name: Colleen Fassnacht Pulsifer Date of Encounter: 01/03/2021  [facility] Cardiologist: Dr. Virgina Jock  Subjective   Feeling better today, no CP, no active nausea  Inpatient Medications    Scheduled Meds: . aspirin  81 mg Oral Daily  . budesonide (PULMICORT) nebulizer solution  0.25 mg Nebulization BID  . chlorhexidine  60 mL Topical Once  . Chlorhexidine Gluconate Cloth  6 each Topical Q0600  . cholecalciferol  2,000 Units Oral Daily  . docusate sodium  100 mg Oral BID  . gentamicin irrigation  80 mg Irrigation On Call  . insulin aspart  0-15 Units Subcutaneous Q4H  . mouth rinse  15 mL Mouth Rinse BID  . polyethylene glycol  17 g Oral Daily  . Thrombi-Pad  1 each Topical Once   Continuous Infusions: . sodium chloride Stopped (01/02/21 0310)  . sodium chloride    . sodium chloride    .  ceFAZolin (ANCEF) IV    . dexmedetomidine (PRECEDEX) IV infusion Stopped (01/02/21 1834)  . norepinephrine (LEVOPHED) Adult infusion 10 mcg/min (01/03/21 0700)   PRN Meds: albuterol, docusate sodium, fentaNYL, ondansetron (ZOFRAN) IV, polyethylene glycol   Vital Signs    Vitals:   01/03/21 0630 01/03/21 0645 01/03/21 0700 01/03/21 0715  BP: (!) 106/50 (!) 101/56 (!) 120/47 (!) 118/54  Pulse: 91 88 88 86  Resp: 16 16 15  (!) 22  Temp:      TempSrc:      SpO2: 98% 98% 99% 96%  Weight:      Height:        Intake/Output Summary (Last 24 hours) at 01/03/2021 0807 Last data filed at 01/03/2021 0700 Gross per 24 hour  Intake 817.23 ml  Output 905 ml  Net -87.77 ml   Last 3 Weights 01/03/2021 01/02/2021 01/02/2021  Weight (lbs) 231 lb 7.7 oz 216 lb 4.3 oz 214 lb 1.1 oz  Weight (kg) 105 kg 98.1 kg 97.1 kg      Telemetry    SR 80's, rare V paced beats - Personally Reviewed  ECG    No new EKGs - Personally Reviewed  Physical Exam   GEN: No acute distress.   Neck: No JVD Cardiac: RRR, no murmurs, rubs, or gallops.  Respiratory:  CTA b/l. GI: Soft,  nontender, non-distended  MS: No edema; No deformity. Neuro:  Nonfocal  Psych: Normal affect   Labs    High Sensitivity Troponin:   Recent Labs  Lab 12/19/20 1154 01/02/21 0136 01/02/21 0406 01/02/21 0921 01/02/21 1222  TROPONINIHS >27,000* 462* 505* 887* 5,036*      Chemistry Recent Labs  Lab 12/27/20 7510 12/28/20 0416 01/02/21 0136 01/02/21 0141 01/02/21 0248 01/02/21 0305 01/02/21 0406 01/03/21 0527  NA  --    < > 133*   < > 130* 129* 128* 130*  K  --    < > 5.3*   < > 4.8 4.8 5.1 3.8  CL  --    < > 94*   < > 97*  --  95* 98  CO2  --    < > 15*  --   --   --  17* 20*  GLUCOSE  --    < > 313*   < > 329*  --  377* 209*  BUN  --    < > 55*   < > 54*  --  55* 60*  CREATININE  --    < > 3.34*   < > 3.10*  --  3.32* 2.73*  CALCIUM  --    < > 8.3*  --   --   --  7.8* 7.7*  PROT 5.7*  --  5.6*  --   --   --   --   --   ALBUMIN 2.8*  --  2.9*  --   --   --   --   --   AST 61*  --  45*  --   --   --   --   --   ALT 343*  --  81*  --   --   --   --   --   ALKPHOS 152*  --  153*  --   --   --   --   --   BILITOT 1.9*  --  1.7*  --   --   --   --   --   GFRNONAA  --    < > 16*  --   --   --  16* 20*  ANIONGAP  --    < > 24*  --   --   --  16* 12   < > = values in this interval not displayed.     Hematology Recent Labs  Lab 01/02/21 0406 01/02/21 1222 01/03/21 0527  WBC 21.6* 26.2* 15.1*  RBC 3.71* 3.45* 3.10*  HGB 11.2* 10.3* 9.3*  HCT 34.4* 32.7* 28.6*  MCV 92.7 94.8 92.3  MCH 30.2 29.9 30.0  MCHC 32.6 31.5 32.5  RDW 14.4 14.4 14.5  PLT 255 237 192    BNP Recent Labs  Lab 01/02/21 0406  BNP 275.4*     DDimer No results for input(s): DDIMER in the last 168 hours.   Radiology    CARDIAC CATHETERIZATION Result Date: 01/02/2021 Successful insertion of temporary transvenous Pacemaker. Plan: Per CCM. Primary cardiology to reconsult EP service for consideration of permanent pacemaker.   DG CHEST PORT 1 VIEW Result Date: 01/03/2021 CLINICAL DATA:  Fever  EXAM: PORTABLE CHEST 1 VIEW COMPARISON:  01/02/2021 FINDINGS: Interval extubation. Pulmonary insufflation has been preserved. Lungs are clear. No pneumothorax or pleural effusion. Inferiorly approaching intravenous pacemaker lead is unchanged overlying the expected right ventricle toward the apex. Cardiac size within normal limits. Pulmonary vascularity is normal. No acute bone abnormality. IMPRESSION: Interval extubation with preservation of pulmonary insufflation. Lungs are clear. Electronically Signed   By: Fidela Salisbury MD   On: 01/03/2021 05:41      Cardiac Studies    12/24/2020: LHC  LV end diastolic pressure is normal.   LM: Normal LAD: Prox mod calcific 40% stenosis (Unchanges compared to cath 2018) LCx: Mid 100% occlusion, likely chronic. Reconstitution through retrograde filling from OM2 (New since cath 2018) RCA: Ostial 100% occlusion, likely the culprit for her STEMI with late presentation         Retrograde filling from LAD up to distal RCA (New since cath 2018)  RAL 12 mmHg LVEDP: 10 mmHg  I attempted wiring LCx to see if this lesion was CTO  Will discuss ischemia guided revascularization in future for LCx +/- LAD.   12/19/20; TTE IMPRESSIONS  1. Left ventricular ejection fraction, by estimation, is 55 to 60%. The  left ventricle has normal function. The left ventricle demonstrates  regional wall motion abnormalities (see scoring diagram/findings for  description). Left ventricular diastolic  parameters are indeterminate. There is mild hypokinesis of the left  ventricular, basal inferolateral wall.  2. Right ventricular systolic function is moderately reduced.  The right  ventricular size is moderately enlarged. Estimated PASP 33 mmHg.  3. Right atrial size was mildly dilated.  4. Tricuspid valve regurgitation is severe.  5. The inferior vena cava is normal in size with <50% respiratory  variability, suggesting right atrial pressure of 8 mmHg.  6. Comapared  to previous study report in 2018, LV/RV wall motion  abnormalities are new.   Patient Profile     55 y.o. female w/PMHx of Type I DM admitted to [facility] about 3 days of N/V, had a syncopal event the day of her admission, she was found w/inferior STEMI and CHB, AKI, had emergent temp pacing wire placed, cath delayed 2/2 AKI  During the course of her hospital stay her conduction system did imporve and maintained 1:1 conduction last couple days of her hospitalization and not felt to require PPM implant She also did eventually get cath noting CTO Cx, ostial RCA 100% this felt to be her culprit and a 40% proc LAD lesion LVEF was preserved though had RV failure associated with her MI No intervention was done Planned for out patient stress MRI to assess ischemic territory, viability Discharged 12/30/20 ] She had some pAFib during her hospital stay and d/c on Eliquis (at least short term) with ASA, Plavix.  Readmitted yesterday with recurrent vomiting/lethargy at home, reports state EMS found her at some point bradycardic and required external pacing She was brought emergently to cath lab for temp wire She was also intubated for airway protection given sedation and comfort with external pacing  Subsequently extubated  Remains on levophed this AM  K+ 5.1 > 4.8 >> 5.1 >> 3.8 BUN/Creat 55/3.34 >> 60/2.73 Mag 2.1 AST 45 ALT 81  HS Trop 462 > 505 >  887 >> 5036  WBC 9.5 > 21.6 > 26.2 >> 15.2 H/H 10/35 >>> 9.3/28.6 Plts 178 >> 192  Her husband reports that Thursday/Friday after discharge last week she did minimal, feeling weak and tired but Saturday seemed to perk up and was doing well. Saturday had 2 "back to back" large, normal BMs She was in the recliner and mentioned that she felt weak and next time she got up wanted to go to the bed. She had 2-3 episodes of diarrhea, and he helped her to bed, She felt weak and her took her BP/HR, noting low BPsbut HR 60's-70's They reached out to her MD and  were told to hold the entresto Starte to feel poorly, waek, and her BP again low SBP 60's and HR more erratic 30's-70's She then developed nausea and had an episode of vomiting and that is when he called 911    Assessment & Plan    1. Recurrent Heart block     EMS record reviewed     Had SR, Mobitz I > Mobitz II  As above with recent inferior STEMI had regained posy MI condiuction She again has a temp wire in place SR with 1:1 conduction this AM  2. Leukocytosis     Trending down, low grade 100.4 temp this AM     ? Reactive  3. CAD      As above recent inf STEMI      Primary symptoms N/V with her PMD      Initially this presentation was N/V lethargy it seems and initially was found in SR      HS trop are trending upwards      D/w Dr. Virgina Jock, was going to discuss with patient/husband this AM  ?  Stress MRI, though she would need temp pacing wire out ?revascularization candidate ? ? Is she ischemic again  Dr. Quentin Ore has discussed the case with Dr. Virgina Jock and planned to get her off pressor, follow her clinically today Plan perhaps pacing tomorrow pending decision on ischemic eval   ADDEND Discussed case with Dr. Virgina Jock No plans for stress MRI or ischemic eval currently Kept off plavix (for pacer) Started on heparin gtt for Type II MI Feels she will lkely be a slow wean off levophed 2/2 RV dysfunction. Recommends plan to proceed with PPM implant of WBC and temp resolve Seems empirically on cefepime and vanc on admission       For questions or updates, please contact [place] Please consult [email or web address] for contact info under        Signed, Baldwin [place], PA-C  01/03/2021, 8:07 AM

## 2021-01-03 NOTE — Progress Notes (Signed)
[place] for IV heparin Indication: chest pain/ACS, also on Eliquis PTA for episode of afib last admit  Allergies  Allergen Reactions  . Other Other (See Comments)  . Succinylcholine Other (See Comments)    Pseudocholinesterase deficiency    Patient Measurements: Height: 5\' 4"  (162.6 cm) Weight: 105 kg (231 lb 7.7 oz) IBW/kg (Calculated) : 54.7 Heparin Dosing Weight: 77 kg  Vital Signs: Temp: 99 F (37.2 C) (04/11 1634) Temp Source: Core (04/11 1634) BP: 95/57 (04/11 2000) Pulse Rate: 74 (04/11 2000)  Labs: Recent Labs    01/02/21 0136 01/02/21 0141 01/02/21 0248 01/02/21 0305 01/02/21 0406 01/02/21 0921 01/02/21 1222 01/03/21 0527 01/03/21 1802  HGB 10.6*   < > 11.9*   < > 11.2*  --  10.3* 9.3*  --   HCT 35.0*   < > 35.0*   < > 34.4*  --  32.7* 28.6*  --   PLT 178  --   --   --  255  --  237 192  --   APTT 34  --   --   --  112*  --   --   --  153*  LABPROT 21.4*  --   --   --  22.5*  --   --   --   --   INR 1.9*  --   --   --  2.1*  --   --   --   --   HEPARINUNFRC  --   --   --   --   --   --   --   --  >2.20*  CREATININE 3.34*   < > 3.10*  --  3.32*  --   --  2.73*  --   TROPONINIHS 462*  --   --   --  505* 887* 5,036*  --   --    < > = values in this interval not displayed.    Estimated Creatinine Clearance: 27.2 mL/min (A) (by C-G formula based on SCr of 2.73 mg/dL (H)).   Medical History: Past Medical History:  Diagnosis Date  . Asthma   . Complication of anesthesia    Psuedocholinerasterase deficiency per pt  . Diabetes mellitus    TYPE II  . Diabetic macular edema (HCC)    Pt gets injections in eyes with [place]  . Seasonal allergies     Medications:  Infusions:  . sodium chloride Stopped (01/02/21 0310)  . sodium chloride    . sodium chloride    . ceFEPime (MAXIPIME) IV Stopped (01/03/21 1002)  . heparin 1,550 Units/hr (01/03/21 1800)  . norepinephrine (LEVOPHED) Adult infusion 5 mcg/min (01/03/21  1851)  . [START ON 01/05/2021] vancomycin      Assessment: 55 yo female with recent Eliquis that was started last admission after an episode of afib.  Eliquis held so far this admission.  Now pharmacy asked to start IV heparin for r/o ACS with rising troponin.  Heparin level >2.2 and aPTT elevated as well at 153 - heparin level appears to be slightly altered by somewhat recent DOAC use so will dose via aPTT.  Goal of Therapy:  Heparin level 0.3-0.7 units/ml Monitor platelets by anticoagulation protocol: Yes   Plan:  -Hold heparin x30 minutes then reduce to 1350 units/h -Recheck heparin level and aPTT in 8h  Arrie Senate, PharmD, [place], [facility] Clinical Pharmacist [phone] Please check AMION for all [place] numbers 01/03/2021

## 2021-01-03 NOTE — Plan of Care (Signed)
  Problem: Education: Goal: Knowledge of General Education information will improve Description: Including pain rating scale, medication(s)/side effects and non-pharmacologic comfort measures Outcome: Progressing   Problem: Health Behavior/Discharge Planning: Goal: Ability to manage health-related needs will improve Outcome: Progressing   Problem: Clinical Measurements: Goal: Ability to maintain clinical measurements within normal limits will improve Outcome: Progressing Goal: Will remain free from infection Outcome: Progressing Goal: Diagnostic test results will improve Outcome: Progressing Goal: Respiratory complications will improve Outcome: Progressing Goal: Cardiovascular complication will be avoided Outcome: Progressing   Problem: Activity: Goal: Risk for activity intolerance will decrease Outcome: Progressing   Problem: Nutrition: Goal: Adequate nutrition will be maintained Outcome: Progressing   Problem: Coping: Goal: Level of anxiety will decrease Outcome: Progressing   Problem: Elimination: Goal: Will not experience complications related to urinary retention Outcome: Progressing   Problem: Pain Managment: Goal: General experience of comfort will improve Outcome: Progressing   Problem: Safety: Goal: Ability to remain free from injury will improve Outcome: Progressing   Problem: Skin Integrity: Goal: Risk for impaired skin integrity will decrease Outcome: Progressing   Problem: Cardiac: Goal: Ability to achieve and maintain adequate cardiopulmonary perfusion will improve Outcome: Progressing   Problem: Elimination: Goal: Will not experience complications related to bowel motility Outcome: Not Progressing

## 2021-01-03 NOTE — Progress Notes (Signed)
NAME:  Colleen [name], MRN:  [id number], DOB:  June 29, 1966, LOS: 1 ADMISSION DATE:  01/02/2021, CONSULTATION DATE:  4/10 REFERRING MD:  Dr. Eston Mould EDP , CHIEF COMPLAINT:  Heart block  History of Present Illness:  55 year old female with PMH as below, which is significant for DM1 on pump. She was recently admitted 3/27 > 4/7 for late presentation STEMI and was in AV block. She had transvenous pacemaker placed and was given time. AV block did improve and she was taken for [facility]. Multiple areas on occlusion. No PCI done. Plans for stress MRI as an outpatient. She presented to [facility] ED 4/10 early AM. She was having vomiting and lethargy at home and given her recent admit husband was worried for heart attack. Upon arrival was lethargic and bradycardic to the 30s. She was placed on transcutaneous pacing, but required high amperage to maintain capture and was intubated for comfort and airway considerations. She was taken to the cardiac cath lab where transvenous pacemaker was placed. PCCM asked to assist with vent/medical management.   Pertinent  Medical History   has a past medical history of Asthma, Complication of anesthesia, Diabetes mellitus, Diabetic macular edema ([place]), and Seasonal allergies.   Significant Hospital Events: Including procedures, antibiotic start and stop dates in addition to other pertinent events   . 4/10 admit for heart block, intubated. Transvenous pacemaker placed.   Interim History / Subjective:  Colleen [name] is feeling well today. Breathing is stable, but she has a minimally productive cough today. Febrile overnight.  Objective   Blood pressure (!) 118/54, pulse 86, temperature (!) 100.4 F (38 C), temperature source Bladder, resp. rate (!) 22, height 5\' 4"  (1.626 m), weight 105 kg, SpO2 96 %.    Vent Mode: PSV;CPAP FiO2 (%):  [36 %-60 %] 36 % Set Rate:  [22 bmp] 22 bmp Vt Set:  [430 mL] 430 mL PEEP:  [5 cmH20] 5 cmH20 Pressure Support:  [12 cmH20] 12  cmH20 Plateau Pressure:  [14 cmH20-16 cmH20] 14 cmH20   Intake/Output Summary (Last 24 hours) at 01/03/2021 0753 Last data filed at 01/03/2021 0700 Gross per 24 hour  Intake 817.23 ml  Output 915 ml  Net -97.77 ml   Filed Weights   01/02/21 0207 01/02/21 0500 01/03/21 0448  Weight: 97.1 kg 98.1 kg 105 kg   Physical Exam: General: middle-aged woman laying in bed in NAD HENT: [place]/AT Eyes: anicteric Respiratory: CTAB, breathing comfortably on RA Cardiovascular: S1S2, RRR GI: soft, NT, ND Extremities: no peripheral edema, no cyanosis Derm: warm, dry, no rashes. No cellulitis around scabs on central chest. No wounds on feet Neuro: awake, moving all extremities, answering questions appropriately GU: foley   Labs/imaging that I have personally reviewed  (right click and "Reselect all SmartList Selections" daily)  BNP 1557.4 Coox 63 BUN 60 Creatinine 2.73 Phos 4.9 Lactic acid 1.8 WBC 15.1  CXR> left lateral opacity  Resolved Hospital Problem list     Assessment & Plan:   Complete heart block: in the setting of recent MI CAD - Transvenous pacemaker placed by Dr. [place].  To remain in place today.  Unfortunately this will have to be removed prior to cardiac MRI.  This will be orchestrated by [facility] cardiology.  Will eventually need permanent pacemaker, which will be done after MRI and once afebrile with negative cultures. -Continue aspirin, statin.  Beta-blocker on hold due to heart block.  Cardiogenic shock secondary to CHB; chronic HFpEF -TVPM in place -Continue norepinephrine as  required to maintain MAP greater than 65.  -Daily Coox, BNP -Cardiology and EP following -Continue holding Plavix to reduce risk of bleeding during pacemaker implantation.  Acute blood loss anemia from right groin hematoma related to TVPM placement -Continue to monitor clinically.  Pressure dressing. -Trend CBC -transfuse for Hb <7 or hemodynamically significant bleeding  Acute hypoxemic  respiratory failure> resolved -Incentive spirometry, pulmonary hygiene  AKI with hyperphosphatemia: cardiorenal vs ATN from cardiogenic shock. Prior to admission, she reported weight gain and started on diuretics. AKI improving. -Maintain adequate renal perfusion -Strict I's/O -Continue to monitor renal function  IDDM with current hyperglycemia; on home insulin pump - CBG monitoring and SSI  -Adding long-acting insulin  Fever- unclear etiology. Concern for left pneumonia. -starting empiric antibiotics -blood cx drawn -sputum culture  Hyponatremia likely hypervolemic hyponatremia from heart failure -monitor -needs diuresis eventually, but renal function is limiting factor  Husband updated at bedside.   Best practice (right click and "Reselect all SmartList Selections" daily)  Diet:  Oral Pain/Anxiety/Delirium protocol (if indicated): No VAP protocol (if indicated): Not indicated DVT prophylaxis: Systemic AC GI prophylaxis: PPI Glucose control:  SSI Yes Central venous access:  Yes, and it is still needed Arterial line:  N/A Foley:  N/A Mobility:  bed rest  PT consulted: N/A Last date of multidisciplinary goals of care discussion [ 4/10] Code Status:  full code Disposition: ICU  Labs   CBC: Recent Labs  Lab 12/29/20 0335 01/02/21 0136 01/02/21 0141 01/02/21 0248 01/02/21 0305 01/02/21 0406 01/02/21 1222 01/03/21 0527  WBC 5.3 9.5  --   --   --  21.6* 26.2* 15.1*  NEUTROABS  --  6.4  --   --   --   --   --   --   HGB 9.2* 10.6*   < > 11.9* 11.9* 11.2* 10.3* 9.3*  HCT 28.1* 35.0*   < > 35.0* 35.0* 34.4* 32.7* 28.6*  MCV 91.8 100.6*  --   --   --  92.7 94.8 92.3  PLT 103* 178  --   --   --  255 237 192   < > = values in this interval not displayed.    Basic Metabolic Panel: Recent Labs  Lab 12/29/20 0335 12/29/20 9983 12/30/20 0415 01/02/21 0136 01/02/21 0141 01/02/21 0248 01/02/21 0305 01/02/21 0406 01/03/21 0527  NA  --  136 134* 133* 130* 130* 129*  128* 130*  K  --  3.9 3.8 5.3* 5.1 4.8 4.8 5.1 3.8  CL  --  100 100 94* 96* 97*  --  95* 98  CO2  --  28 28 15*  --   --   --  17* 20*  GLUCOSE  --  122* 53* 313* 286* 329*  --  377* 209*  BUN  --  29* 27* 55* 57* 54*  --  55* 60*  CREATININE  --  1.57* 1.50* 3.34* 3.20* 3.10*  --  3.32* 2.73*  CALCIUM  --  8.1* 8.4* 8.3*  --   --   --  7.8* 7.7*  MG 1.9  --  1.9 2.1  --   --   --  1.9 1.7  PHOS  --   --   --   --   --   --   --  6.5* 4.9*   GFR: Estimated Creatinine Clearance: 27.2 mL/min (A) (by C-G formula based on SCr of 2.73 mg/dL (H)). Recent Labs  Lab 01/02/21 0136 01/02/21 0406 01/02/21 0725 01/02/21  1222 01/03/21 0527  WBC 9.5 21.6*  --  26.2* 15.1*  LATICACIDVEN  --  4.9* 5.2* 4.5* 1.8    Liver Function Tests: Recent Labs  Lab 12/27/20 0941 01/02/21 0136  AST 61* 45*  ALT 343* 81*  ALKPHOS 152* 153*  BILITOT 1.9* 1.7*  PROT 5.7* 5.6*  ALBUMIN 2.8* 2.9*   No results for input(s): LIPASE, AMYLASE in the last 168 hours. No results for input(s): AMMONIA in the last 168 hours.  ABG    Component Value Date/Time   PHART 7.306 (L) 12/19/2020 2302   PCO2ART 22.4 (L) 12/19/2020 2302   PO2ART 119 (H) 12/19/2020 2302   HCO3 18.9 (L) 01/02/2021 0305   TCO2 20 (L) 01/02/2021 0305   ACIDBASEDEF 8.0 (H) 01/02/2021 0305   O2SAT 88.0 01/02/2021 0305     Coagulation Profile: Recent Labs  Lab 01/02/21 0136 01/02/21 0406  INR 1.9* 2.1*    Cardiac Enzymes: No results for input(s): CKTOTAL, CKMB, CKMBINDEX, TROPONINI in the last 168 hours.  HbA1C: Hgb A1c MFr Bld  Date/Time Value Ref Range Status  01/02/2021 01:36 AM 7.5 (H) 4.8 - 5.6 % Final    Comment:    (NOTE) Pre diabetes:          5.7%-6.4%  Diabetes:              >6.4%  Glycemic control for   <7.0% adults with diabetes   12/19/2020 11:54 AM 7.8 (H) 4.8 - 5.6 % Final    Comment:    (NOTE) Pre diabetes:          5.7%-6.4%  Diabetes:              >6.4%  Glycemic control for   <7.0% adults with  diabetes     CBG: Recent Labs  Lab 01/02/21 1517 01/02/21 2016 01/02/21 2339 01/03/21 0418 01/03/21 0743  GLUCAP 139* 99 138* 207* 145*   This patient is critically ill with multiple organ system failure which requires frequent high complexity decision making, assessment, support, evaluation, and titration of therapies. This was completed through the application of advanced monitoring technologies and extensive interpretation of multiple databases. During this encounter critical care time was devoted to patient care services described in this note for 43 minutes.  Julian Hy, DO 01/03/21 4:42 PM [facility]

## 2021-01-03 NOTE — Progress Notes (Signed)
Pharmacy Antibiotic Note  Colleen [name] is a 55 y.o. female admitted on 01/02/2021 with symptomatic bradycardia, Nausea, vomiting.  Now with mild fever, suspected PNA.  Pharmacy has been consulted for vancomycin and cefepime dosing.  Scr elevated, but trending down.  Currently, estimated CrCl ~ 25 ml/min  Plan: Vancomycin 2g x 1, then Vancomycin 1000 IV every 48 hours.  Goal trough 15-20 mcg/mL.   Cefepime 2g IV q 24 hrs Will need to watch renal function closely and adjust dosing appropriately. F/u cultures, clinical course.  Height: 5\' 4"  (162.6 cm) Weight: 105 kg (231 lb 7.7 oz) IBW/kg (Calculated) : 54.7  Temp (24hrs), Avg:99.5 F (37.5 C), Min:98.2 F (36.8 C), Max:100.4 F (38 C)  Recent Labs  Lab 12/29/20 0335 12/29/20 0819 01/02/21 0136 01/02/21 0141 01/02/21 0248 01/02/21 0406 01/02/21 0725 01/02/21 1222 01/03/21 0527  WBC 5.3  --  9.5  --   --  21.6*  --  26.2* 15.1*  CREATININE  --    < > 3.34* 3.20* 3.10* 3.32*  --   --  2.73*  LATICACIDVEN  --   --   --   --   --  4.9* 5.2* 4.5* 1.8   < > = values in this interval not displayed.    Estimated Creatinine Clearance: 27.2 mL/min (A) (by C-G formula based on SCr of 2.73 mg/dL (H)).    Allergies  Allergen Reactions  . Other Other (See Comments)  . Succinylcholine Other (See Comments)    Pseudocholinesterase deficiency    Antimicrobials this admission:  Vanc 4/11 >  Cefepime 4/11 >   Microbiology results:  4/11 BCx x 2 >  4/11 UCx >  4/11 Sputum Cx >   Thank you for allowing pharmacy to be a part of this patient's care.  [place] Crane, Roylene Reason, BCCP Clinical Pharmacist  01/03/2021 12:58 PM   [facility] pharmacy phone numbers are listed on [email or web address]

## 2021-01-03 NOTE — Progress Notes (Addendum)
eLink Physician-Brief Progress Note Patient Name: Colleen [name] DOB: 06/14/1966 MRN: [id number]   Date of Service  01/03/2021  HPI/Events of Note  New fever. Leukocytosis, elevated LA, extubation status, going for Pacemaker in AM. Not on abx. Asking for tylenol.    eICU Interventions  discussed.  stat LA/CBC/CxR/blood culture  to follow through. going for pacemaker in AM, mostly will need abx if labs are abnormal still.      Intervention Category Intermediate Interventions: Other:;Infection - evaluation and management  Elmer Sow 01/03/2021, 4:35 AM

## 2021-01-03 NOTE — Progress Notes (Signed)
Nutrition Brief Note  Initially consulted for TF on 4/10, brief note and TF protocol initiated but pt extubated the same day.   Wt Readings from Last 15 Encounters:  01/03/21 105 kg  12/30/20 97.1 kg  11/15/20 94.3 kg  09/29/20 97.1 kg  10/17/19 93.7 kg  08/18/19 84.2 kg  12/12/17 78 kg  06/12/17 76.2 kg  05/08/17 78.9 kg  03/29/17 79.4 kg  02/09/17 89.7 kg  01/19/17 91.4 kg  03/23/16 79.4 kg  07/09/15 71.7 kg  01/17/12 70.8 kg    Body mass index is 39.73 kg/m.   Current diet order is Heart Healthy, patient is consuming approximately 50-100% of meals at this time. Labs and medications reviewed.   No nutrition interventions warranted at this time. If nutrition issues arise, please consult RD.   Kerman Passey MS, RDN, LDN, CNSC Registered Dietitian III Clinical Nutrition RD Pager and On-Call Pager Number Located in [place]

## 2021-01-03 NOTE — Progress Notes (Signed)
Inpatient Diabetes Program Recommendations  AACE/ADA: New Consensus Statement on Inpatient Glycemic Control  Target Ranges:  Prepandial:   less than 140 mg/dL      Peak postprandial:   less than 180 mg/dL (1-2 hours)      Critically ill patients:  140 - 180 mg/dL   Results for Colleen [name], Colleen [name] (MRN [id number]) as of 01/03/2021 11:26  Ref. Range 01/02/2021 07:29 01/02/2021 11:55 01/02/2021 15:17 01/02/2021 20:16 01/02/2021 23:39 01/03/2021 04:18 01/03/2021 07:43  Glucose-Capillary Latest Ref Range: 70 - 99 mg/dL 381 (H) 198 (H) 139 (H) 99 138 (H) 207 (H) 145 (H)   Review of Glycemic Control  Diabetes history: DM1 (makes NO insulin; requires basal, correction, and carb coverage insulin) Outpatient Diabetes medications: Medtronic insulin pump with Humalog Current orders for Inpatient glycemic control: Levemir 8 units BID, Novolog 0-15 units Q4H  Inpatient Diabetes Program Recommendations:    Insulin: Please consider decreasing Novolog correction to 0-9 units Q4H (or AC&HS if diet tolerated). Please consider ordering Novolog 3 units TID with meals for meal coverage if patient eats at least 50% of meals.   NOTE: Patient has Type 1 DM and uses an insulin pump outpatient. Patient was recently inpatient 12/19/20-12/30/20 and diabetes coordinator spoke with patient and patient's Endocrinologist on 12/20/20 regarding DM control and insulin pump settings. Per chart, patient presented back to the hospital on 01/02/21 and admitted with heart block, acute hypoxemic respiratory failure, shock, AKI. Patient was intubated for management of the discomfort of transcutaneous pacing. Then taken to cath lab where transvenous pacemaker was inserted. Patient is no longer intubated and has diet ordered. Since patient has Type 1 DM, she will need to have basal, correction, and carbohydrate coverage insulin ordered.  Thanks, Barnie Alderman, RN, MSN, CDE Diabetes Coordinator Inpatient Diabetes Program [phone] (Team Pager from  8am to 5pm)

## 2021-01-03 NOTE — Progress Notes (Addendum)
Subjective:  Feels better Extubated No chest pain  1:1 AV conduction on telemetry  Pacemaker in place Rt groin  Objective:  Vital Signs in the last 24 hours: Temp:  [98.2 F (36.8 C)-100.4 F (38 C)] 99.5 F (37.5 C) (04/11 1400) Pulse Rate:  [69-98] 78 (04/11 1415) Resp:  [12-26] 13 (04/11 1415) BP: (62-215)/(39-159) 110/62 (04/11 1415) SpO2:  [92 %-100 %] 99 % (04/11 1415) FiO2 (%):  [36 %-41 %] 36 % (04/10 1831) Weight:  [105 kg] 105 kg (04/11 0448)  Intake/Output from previous day: 04/10 0701 - 04/11 0700 In: 817.2 [I.V.:667.2; NG/GT:150] Out: 915 [Urine:915]  Physical Exam Vitals and nursing note reviewed.  Constitutional:      General: She is not in acute distress.    Appearance: She is well-developed.  HENT:     Head: Normocephalic and atraumatic.  Eyes:     Conjunctiva/sclera: Conjunctivae normal.     Pupils: Pupils are equal, round, and reactive to light.  Neck:     Vascular: No JVD.  Cardiovascular:     Rate and Rhythm: Normal rate and regular rhythm.     Pulses: Normal pulses and intact distal pulses.     Heart sounds: No murmur heard.     Comments: Pacemaker in place Rt groin Pulmonary:     Effort: Pulmonary effort is normal.     Breath sounds: Normal breath sounds. No wheezing or rales.  Abdominal:     General: Bowel sounds are normal.     Palpations: Abdomen is soft.     Tenderness: There is no rebound.  Musculoskeletal:        General: No tenderness. Normal range of motion.     Right lower leg: No edema.     Left lower leg: No edema.  Lymphadenopathy:     Cervical: No cervical adenopathy.  Skin:    General: Skin is warm and dry.  Neurological:     Mental Status: She is alert and oriented to person, place, and time.     Cranial Nerves: No cranial nerve deficit.      Lab Results: BMP Recent Labs    01/02/21 0136 01/02/21 0141 01/02/21 0248 01/02/21 0305 01/02/21 0406 01/03/21 0527  NA 133*   < > 130* 129* 128* 130*  K 5.3*   <  > 4.8 4.8 5.1 3.8  CL 94*   < > 97*  --  95* 98  CO2 15*  --   --   --  17* 20*  GLUCOSE 313*   < > 329*  --  377* 209*  BUN 55*   < > 54*  --  55* 60*  CREATININE 3.34*   < > 3.10*  --  3.32* 2.73*  CALCIUM 8.3*  --   --   --  7.8* 7.7*  GFRNONAA 16*  --   --   --  16* 20*   < > = values in this interval not displayed.    CBC Recent Labs  Lab 01/02/21 0136 01/02/21 0141 01/03/21 0527  WBC 9.5   < > 15.1*  RBC 3.48*   < > 3.10*  HGB 10.6*   < > 9.3*  HCT 35.0*   < > 28.6*  PLT 178   < > 192  MCV 100.6*   < > 92.3  MCH 30.5   < > 30.0  MCHC 30.3   < > 32.5  RDW 14.5   < > 14.5  LYMPHSABS 2.3  --   --  MONOABS 0.7  --   --   EOSABS 0.1  --   --   BASOSABS 0.0  --   --    < > = values in this interval not displayed.    HEMOGLOBIN A1C Lab Results  Component Value Date   HGBA1C 7.5 (H) 01/02/2021   MPG 168.55 01/02/2021    Cardiac Panel (last 3 results) No results for input(s): CKTOTAL, CKMB, TROPONINI, RELINDX in the last 8760 hours.  BNP (last 3 results) Recent Labs    12/21/20 1012 01/02/21 0406 01/03/21 0815  BNP 1,323.7* 275.4* 1,557.4*    TSH Recent Labs    12/21/20 1012  TSH 1.862    Lipid Panel     Component Value Date/Time   CHOL 118 01/02/2021 0136   CHOL 164 10/08/2019 0901   TRIG 80 01/03/2021 0527   HDL 42 01/02/2021 0136   HDL 99 10/08/2019 0901   CHOLHDL 2.8 01/02/2021 0136   VLDL 42 (H) 01/02/2021 0136   LDLCALC 34 01/02/2021 0136   LDLCALC 54 10/08/2019 0901   LDLDIRECT 61 10/08/2019 0901     Hepatic Function Panel Recent Labs    12/20/20 0415 12/21/20 0328 12/23/20 0248 12/27/20 0941 01/02/21 0136  PROT 5.6*   < > 5.1* 5.7* 5.6*  ALBUMIN 3.4*   < > 2.8* 2.8* 2.9*  AST 4,893*   < > 628* 61* 45*  ALT 2,931*   < > 1,379* 343* 81*  ALKPHOS 114   < > 104 152* 153*  BILITOT 2.0*   < > 2.2* 1.9* 1.7*  BILIDIR 0.5*  --   --  0.5*  --   IBILI 1.5*  --   --  1.4*  --    < > = values in this interval not displayed.     Cardiac Studies:  EKG 01/02/2021: Sinus rhythm 97 bpm Inferior infarct, age indeterminate  Echocardiogram 01/02/2021: 1. LIMITED ECHOCARDIOGRAM; Full Study dated 12/19/2020.  2. Left ventricular ejection fraction, by estimation, is 50 to 55%. The  left ventricle has low normal function. Diastolic function not assessed  due to limited study.  3. Right ventricular systolic function is mildly reduced. The right  ventricular size is mildly enlarged. There is mildly elevated pulmonary  artery systolic pressure. The estimated right ventricular systolic  pressure is 43.3 mmHg.  4. Left atrial size was grossly normal in size.  5. The pericardial effusion is posterior to the left ventricle.  6. The mitral valve is grossly normal. No evidence of mitral valve  regurgitation. No evidence of mitral stenosis.  7. The aortic valve is grossly normal.  8. The inferior vena cava is dilated in size with >50% respiratory  variability, suggesting right atrial pressure of 8 mmHg.   Coronary angiography 12/24/2020: LM: Normal LAD: Prox mod calcific 40% stenosis (Unchanges compared to cath 2018) LCx: Mid 100% occlusion, likely chronic. Reconstitution through retrograde filling from OM2 (New since cath 2018) RCA: Ostial 100% occlusion, likely the culprit for her STEMI with late presentation Retrograde filling from LAD up to distal RCA (New since cath 2018)  RAL 12 mmHg LVEDP: 10 mmHg Assessment & Recommendations:  55 y.o.Caucasianfemale,pharmacist by profession,withtype 1 diabetes mellitus, inferior STEMI (11/2020) with no revascularization due to late presentation and AKI, complicated by completete AV block, resolved with 1:1 conduction on discharge, returns with asystolic cardiac arrest  Cardiac arrest: Secondary to asystole, spontaneously resolved on arrival to Cath Lab. Given presentation, temporary pacemaker was placed. Required intubation for airway protection, now  extubated Will need permanent pacemaker before discharge, given labile AV conduction Given her fever T-max 100.80F, leukocytosis, and possibility of pneumonia, will need to await complete resolution of any infection before placement of permanent pacemaker. Appreciate EP input. Management of possible pneumonia as per CCM.  Cardiogenic shock: Secondary to complete AV block and asystole, now improving. Wean off Levophed as tolerated  Type II MI: Recent inferior infarct with residual mid circumflex CTO and moderate mid LAD disease.  Now presented with asystole, cardiogenic shock, leading to troponin elevation. Recommend IV heparin, aspirin for now. Holding Plavix and Eliquis given upcoming pacemaker placement.  Coronary artery disease: Acute inferior STEMI with ostial RCA occlusion in 11/2020. Mid circumflex CTO and mid LAD 70% stenosis. My initial plan was to perform outpatient cardiac MRI with stress perfusion to evaluate for both ischemia and viability.  However, patient is now admitted with asystole, currently has temporary pacemaker and will require permanent pacemaker.  Given her labile AV conduction, I do not think it is worth the risk to remove the temporary pacemaker for a cardiac MRI. At this time, her primary issue is AV conduction for which she will need permanent pacemaker regardless.  Also discussed with CT operator Dr. Martinique regarding possibility of left circumflex CTO PCI.  However, we both agreed that, Risks do not outweigh benefits of any potential revascularization at this time. Continue aspirin, heparin, statin. We will introduce beta-blocker after placement of permanent pacemaker.  I had a long conversation with patient and her husband regarding review of her last hospitalization, current hospitalization, labile nature of her AV conduction, and issues with regards to AV block as well as CAD.  All questions answered to my best ability.  AKI: Prerenal.  Given recent aspirin  use 11/2020 hospitalization, I am very optimistic that her kidneys will recover remarkably.  Continue I/O monitoring.  Type I DM: Patient has reportedly had clotting of her insulin pump ever since being on antiplatelet therapy and Eliquis.  I suspect this may be due to easy bleeding leading to ex vivo clotting in the insulin pump tubing.  Continue insulin as per CCM for now.  May need to discuss this with endocrinologist outpatient.   CRITICAL CARE Performed by: Vernell Leep   Total critical care time: 60 minutes   Critical care time was exclusive of separately billable procedures and treating other patients.   Critical care was necessary to treat or prevent imminent or life-threatening deterioration.   Critical care was time spent personally by me on the following activities: development of treatment plan with patient and/or surrogate as well as nursing, discussions with consultants, evaluation of patient's response to treatment, examination of patient, obtaining history from patient or surrogate, ordering and performing treatments and interventions, ordering and review of laboratory studies, ordering and review of radiographic studies, pulse oximetry and re-evaluation of patient's condition.        Nigel Mormon, MD Pager: [phone] Office: [phone]

## 2021-01-04 DIAGNOSIS — N179 Acute kidney failure, unspecified: Secondary | ICD-10-CM | POA: Diagnosis not present

## 2021-01-04 DIAGNOSIS — R570 Cardiogenic shock: Secondary | ICD-10-CM | POA: Diagnosis not present

## 2021-01-04 DIAGNOSIS — I2510 Atherosclerotic heart disease of native coronary artery without angina pectoris: Secondary | ICD-10-CM | POA: Diagnosis not present

## 2021-01-04 DIAGNOSIS — I442 Atrioventricular block, complete: Secondary | ICD-10-CM | POA: Diagnosis not present

## 2021-01-04 LAB — URINE CULTURE: Culture: <10000 — AB

## 2021-01-04 LAB — CBC
HCT: 28.4 % — ABNORMAL LOW (ref 36.0–46.0)
HCT: 26.3 % — ABNORMAL LOW (ref 36.0–46.0)
Hemoglobin: 9 g/dL — ABNORMAL LOW (ref 12.0–15.0)
Hemoglobin: 8.4 g/dL — ABNORMAL LOW (ref 12.0–15.0)
MCH: 29.6 pg (ref 26.0–34.0)
MCH: 29.8 pg (ref 26.0–34.0)
MCHC: 31.7 g/dL (ref 30.0–36.0)
MCHC: 31.9 g/dL (ref 30.0–36.0)
MCV: 93.4 fL (ref 80.0–100.0)
MCV: 93.3 fL (ref 80.0–100.0)
Platelets: 184 10*3/uL (ref 150–400)
Platelets: 160 10*3/uL (ref 150–400)
RBC: 3.04 MIL/uL — ABNORMAL LOW (ref 3.87–5.11)
RBC: 2.82 MIL/uL — ABNORMAL LOW (ref 3.87–5.11)
RDW: 14.5 % (ref 11.5–15.5)
RDW: 14.4 % (ref 11.5–15.5)
WBC: 6.7 10*3/uL (ref 4.0–10.5)
WBC: 5.4 10*3/uL (ref 4.0–10.5)
nRBC: 0 % (ref 0.0–0.2)
nRBC: 0 % (ref 0.0–0.2)

## 2021-01-04 LAB — GLUCOSE, CAPILLARY
Glucose-Capillary: 152 mg/dL — ABNORMAL HIGH (ref 70–99)
Glucose-Capillary: 204 mg/dL — ABNORMAL HIGH (ref 70–99)
Glucose-Capillary: 243 mg/dL — ABNORMAL HIGH (ref 70–99)
Glucose-Capillary: 296 mg/dL — ABNORMAL HIGH (ref 70–99)
Glucose-Capillary: 273 mg/dL — ABNORMAL HIGH (ref 70–99)

## 2021-01-04 LAB — EXPECTORATED SPUTUM ASSESSMENT W GRAM STAIN, RFLX TO RESP C

## 2021-01-04 LAB — BASIC METABOLIC PANEL
Anion gap: 6 (ref 5–15)
BUN: 43 mg/dL — ABNORMAL HIGH (ref 6–20)
CO2: 24 mmol/L (ref 22–32)
Calcium: 8 mg/dL — ABNORMAL LOW (ref 8.9–10.3)
Chloride: 102 mmol/L (ref 98–111)
Creatinine, Ser: 1.74 mg/dL — ABNORMAL HIGH (ref 0.44–1.00)
GFR, Estimated: 34 mL/min — ABNORMAL LOW (ref >60)
Glucose, Bld: 157 mg/dL — ABNORMAL HIGH (ref 70–99)
Potassium: 4.3 mmol/L (ref 3.5–5.1)
Sodium: 132 mmol/L — ABNORMAL LOW (ref 135–145)

## 2021-01-04 LAB — APTT
aPTT: 109 seconds — ABNORMAL HIGH (ref 24–36)
aPTT: 78 seconds — ABNORMAL HIGH (ref 24–36)

## 2021-01-04 LAB — VANCOMYCIN, RANDOM: Vancomycin Rm: 16

## 2021-01-04 LAB — HEPARIN LEVEL (UNFRACTIONATED): Heparin Unfractionated: 1.64 IU/mL — ABNORMAL HIGH (ref 0.30–0.70)

## 2021-01-04 MED ORDER — GUAIFENESIN-DM 100-10 MG/5ML PO SYRP
5.0000 mL | ORAL_SOLUTION | ORAL | Status: DC | PRN
  Administered 2021-01-04 – 2021-01-09 (×9): 5 mL via ORAL
  Filled 2021-01-04 (×9): qty 5

## 2021-01-04 MED ORDER — SODIUM CHLORIDE 0.9 % IV SOLN
12.5000 mg | Freq: Once | INTRAVENOUS | Status: DC
  Filled 2021-01-04: qty 0.5

## 2021-01-04 MED ORDER — VANCOMYCIN HCL 750 MG/150ML IV SOLN
750.0000 mg | INTRAVENOUS | Status: DC
  Administered 2021-01-04: 750 mg via INTRAVENOUS
  Filled 2021-01-04 (×2): qty 150

## 2021-01-04 MED ORDER — VANCOMYCIN HCL 750 MG/150ML IV SOLN
750.0000 mg | INTRAVENOUS | Status: DC
  Filled 2021-01-04: qty 150

## 2021-01-04 MED ORDER — SODIUM CHLORIDE 0.9 % IV SOLN
2.0000 g | Freq: Two times a day (BID) | INTRAVENOUS | Status: DC
  Administered 2021-01-04 – 2021-01-05 (×3): 2 g via INTRAVENOUS
  Filled 2021-01-04 (×3): qty 2

## 2021-01-04 NOTE — Progress Notes (Signed)
[facility] Progress Note Patient Name: Colleen [name] DOB: 1966/01/10 MRN: [id number]   Date of Service  01/04/2021  HPI/Events of Note  Nurse requesting medication for cough.  eICU Interventions  Plan: 1. Robitussin DM 5 ml PO Q 4 hours PRN cough.     Intervention Category Major Interventions: Other:  Lysle Dingwall 01/04/2021, 11:24 PM

## 2021-01-04 NOTE — Progress Notes (Signed)
[place] for IV heparin Indication: chest pain/ACS, also on Eliquis PTA for episode of afib last admit  Allergies  Allergen Reactions  . Other Other (See Comments)  . Succinylcholine Other (See Comments)    Pseudocholinesterase deficiency    Patient Measurements: Height: 5\' 4"  (162.6 cm) Weight: 97.1 kg (214 lb 1.1 oz) IBW/kg (Calculated) : 54.7 Heparin Dosing Weight: 77 kg  Vital Signs: Temp: 98.2 F (36.8 C) (04/12 0400) Temp Source: Oral (04/12 0400) BP: 139/64 (04/12 0600) Pulse Rate: 66 (04/12 0600)  Labs: Recent Labs    01/02/21 0136 01/02/21 0141 01/02/21 0248 01/02/21 0305 01/02/21 0406 01/02/21 0921 01/02/21 1222 01/03/21 0527 01/03/21 1802 01/04/21 0605  HGB 10.6*   < > 11.9*   < > 11.2*  --  10.3* 9.3*  --   --   HCT 35.0*   < > 35.0*   < > 34.4*  --  32.7* 28.6*  --   --   PLT 178  --   --   --  255  --  237 192  --   --   APTT 34  --   --   --  112*  --   --   --  153* 109*  LABPROT 21.4*  --   --   --  22.5*  --   --   --   --   --   INR 1.9*  --   --   --  2.1*  --   --   --   --   --   HEPARINUNFRC  --   --   --   --   --   --   --   --  >2.20*  --   CREATININE 3.34*   < > 3.10*  --  3.32*  --   --  2.73*  --   --   TROPONINIHS 462*  --   --   --  505* 887* 5,036*  --   --   --    < > = values in this interval not displayed.    Estimated Creatinine Clearance: 26 mL/min (A) (by C-G formula based on SCr of 2.73 mg/dL (H)).   Medical History: Past Medical History:  Diagnosis Date  . Asthma   . Complication of anesthesia    Psuedocholinerasterase deficiency per pt  . Diabetes mellitus    TYPE II  . Diabetic macular edema (HCC)    Pt gets injections in eyes with [facility]  . Seasonal allergies     Medications:  Infusions:  . sodium chloride Stopped (01/02/21 0310)  . sodium chloride    . sodium chloride    . ceFEPime (MAXIPIME) IV Stopped (01/03/21 1002)  . heparin 1,350 Units/hr (01/04/21 0600)  .  norepinephrine (LEVOPHED) Adult infusion 4 mcg/min (01/04/21 0600)  . promethazine (PHENERGAN) injection (IM or IVPB)    . [START ON 01/05/2021] vancomycin      Assessment: 55 yo female with recent Eliquis that was started last admission after an episode of afib.  Eliquis held so far this admission.  Now pharmacy asked to start IV heparin for r/o ACS with rising troponin.  Heparin level >2.2 on last check and aPTT now down to just above goal at 109 - heparin level appears altered by recent DOAC use so will dose adjust based on aPTT. No bleeding or IV issues noted.   Goal of Therapy:  Aptt goal 66-102s Heparin level 0.3-0.7 units/ml  Monitor platelets by anticoagulation protocol: Yes   Plan:  -Reduce heparin to 1200 units/hr -Recheck aPTT in [place] PharmD., BCPS Clinical Pharmacist 01/04/2021 7:33 AM

## 2021-01-04 NOTE — Progress Notes (Signed)
Subjective:  Feels tired No chest pain  1:1 AV conduction on telemetry Few episodes of sinus arrhtymia Pacemaker in place Rt groin  Objective:  Vital Signs in the last 24 hours: Temp:  [98.2 F (36.8 C)-100.4 F (38 C)] 98.2 F (36.8 C) (04/12 0400) Pulse Rate:  [66-98] 66 (04/12 0600) Resp:  [11-23] 16 (04/12 0600) BP: (62-149)/(39-90) 115/62 (04/12 0849) SpO2:  [93 %-100 %] 99 % (04/12 0600) FiO2 (%):  [21 %] 21 % (04/12 0849) Weight:  [97.1 kg] 97.1 kg (04/12 0500)  Intake/Output from previous day: 04/11 0701 - 04/12 0700 In: 1890.6 [P.O.:960; I.V.:334.8; IV Piggyback:595.8] Out: 2870 [Urine:2870]  Physical Exam Vitals and nursing note reviewed.  Constitutional:      General: She is not in acute distress.    Appearance: She is well-developed.  HENT:     Head: Normocephalic and atraumatic.  Eyes:     Conjunctiva/sclera: Conjunctivae normal.     Pupils: Pupils are equal, round, and reactive to light.  Neck:     Vascular: No JVD.  Cardiovascular:     Rate and Rhythm: Normal rate and regular rhythm.     Pulses: Normal pulses and intact distal pulses.     Heart sounds: No murmur heard.     Comments: Pacemaker in place Rt groin Pulmonary:     Effort: Pulmonary effort is normal.     Breath sounds: Normal breath sounds. No wheezing or rales.  Abdominal:     General: Bowel sounds are normal.     Palpations: Abdomen is soft.     Tenderness: There is no rebound.  Musculoskeletal:        General: No tenderness. Normal range of motion.     Right lower leg: No edema.     Left lower leg: No edema.  Lymphadenopathy:     Cervical: No cervical adenopathy.  Skin:    General: Skin is warm and dry.  Neurological:     Mental Status: She is alert and oriented to person, place, and time.     Cranial Nerves: No cranial nerve deficit.      Lab Results: BMP Recent Labs    01/02/21 0406 01/03/21 0527 01/04/21 0727  NA 128* 130* 132*  K 5.1 3.8 4.3  CL 95* 98 102   CO2 17* 20* 24  GLUCOSE 377* 209* 157*  BUN 55* 60* 43*  CREATININE 3.32* 2.73* 1.74*  CALCIUM 7.8* 7.7* 8.0*  GFRNONAA 16* 20* 34*    CBC Recent Labs  Lab 01/02/21 0136 01/02/21 0141 01/04/21 0727  WBC 9.5   < > 6.7  RBC 3.48*   < > 3.04*  HGB 10.6*   < > 9.0*  HCT 35.0*   < > 28.4*  PLT 178   < > 184  MCV 100.6*   < > 93.4  MCH 30.5   < > 29.6  MCHC 30.3   < > 31.7  RDW 14.5   < > 14.5  LYMPHSABS 2.3  --   --   MONOABS 0.7  --   --   EOSABS 0.1  --   --   BASOSABS 0.0  --   --    < > = values in this interval not displayed.    HEMOGLOBIN A1C Lab Results  Component Value Date   HGBA1C 7.5 (H) 01/02/2021   MPG 168.55 01/02/2021    Cardiac Panel (last 3 results) No results for input(s): CKTOTAL, CKMB, TROPONINI, RELINDX in the last 8760 hours.  BNP (last 3 results) Recent Labs    12/21/20 1012 01/02/21 0406 01/03/21 0815  BNP 1,323.7* 275.4* 1,557.4*    TSH Recent Labs    12/21/20 1012  TSH 1.862    Lipid Panel     Component Value Date/Time   CHOL 118 01/02/2021 0136   CHOL 164 10/08/2019 0901   TRIG 80 01/03/2021 0527   HDL 42 01/02/2021 0136   HDL 99 10/08/2019 0901   CHOLHDL 2.8 01/02/2021 0136   VLDL 42 (H) 01/02/2021 0136   LDLCALC 34 01/02/2021 0136   LDLCALC 54 10/08/2019 0901   LDLDIRECT 61 10/08/2019 0901     Hepatic Function Panel Recent Labs    12/20/20 0415 12/21/20 0328 12/23/20 0248 12/27/20 0941 01/02/21 0136  PROT 5.6*   < > 5.1* 5.7* 5.6*  ALBUMIN 3.4*   < > 2.8* 2.8* 2.9*  AST 4,893*   < > 628* 61* 45*  ALT 2,931*   < > 1,379* 343* 81*  ALKPHOS 114   < > 104 152* 153*  BILITOT 2.0*   < > 2.2* 1.9* 1.7*  BILIDIR 0.5*  --   --  0.5*  --   IBILI 1.5*  --   --  1.4*  --    < > = values in this interval not displayed.    Cardiac Studies:  EKG 01/02/2021: Sinus rhythm 97 bpm Inferior infarct, age indeterminate  Echocardiogram 01/02/2021: 1. LIMITED ECHOCARDIOGRAM; Full Study dated 12/19/2020.  2. Left  ventricular ejection fraction, by estimation, is 50 to 55%. The  left ventricle has low normal function. Diastolic function not assessed  due to limited study.  3. Right ventricular systolic function is mildly reduced. The right  ventricular size is mildly enlarged. There is mildly elevated pulmonary  artery systolic pressure. The estimated right ventricular systolic  pressure is 42.6 mmHg.  4. Left atrial size was grossly normal in size.  5. The pericardial effusion is posterior to the left ventricle.  6. The mitral valve is grossly normal. No evidence of mitral valve  regurgitation. No evidence of mitral stenosis.  7. The aortic valve is grossly normal.  8. The inferior vena cava is dilated in size with >50% respiratory  variability, suggesting right atrial pressure of 8 mmHg.   Coronary angiography 12/24/2020: LM: Normal LAD: Prox mod calcific 40% stenosis (Unchanges compared to cath 2018) LCx: Mid 100% occlusion, likely chronic. Reconstitution through retrograde filling from OM2 (New since cath 2018) RCA: Ostial 100% occlusion, likely the culprit for her STEMI with late presentation Retrograde filling from LAD up to distal RCA (New since cath 2018)  RAL 12 mmHg LVEDP: 10 mmHg Assessment & Recommendations:  55 y.o.Caucasianfemale,pharmacist by profession,withtype 1 diabetes mellitus, inferior STEMI (11/2020) with no revascularization due to late presentation and AKI, complicated by completete AV block, resolved with 1:1 conduction on discharge, returns with asystolic cardiac arrest  Cardiac arrest: Secondary to asystole, spontaneously resolved on arrival to Cath Lab. Given presentation, temporary pacemaker was placed. Required intubation for airway protection, now extubated Will need permanent pacemaker before discharge, given labile AV conduction Given her fever T-max 100.18F, leukocytosis, and possibility of pneumonia, will need to await complete resolution of  any infection before placement of permanent pacemaker. Appreciate EP input. Management of possible pneumonia as per CCM.  Cardiogenic shock: Secondary to complete AV block and asystole, now improving. Wean off Levophed as tolerated, currently at 3 mics  Type II MI: Recent inferior infarct with residual mid circumflex CTO and moderate  mid LAD disease.  Now presented with asystole, cardiogenic shock, leading to troponin elevation. Recommend IV heparin, aspirin for now. Holding Plavix and Eliquis given upcoming pacemaker placement.  Coronary artery disease: Acute inferior STEMI with ostial RCA occlusion in 11/2020. Mid circumflex CTO and mid LAD 70% stenosis. My initial plan was to perform outpatient cardiac MRI with stress perfusion to evaluate for both ischemia and viability.  However, patient is now admitted with asystole, currently has temporary pacemaker and will require permanent pacemaker.  Given her labile AV conduction, I do not think it is worth the risk to remove the temporary pacemaker for a cardiac MRI. At this time, her primary issue is AV conduction for which she will need permanent pacemaker regardless.  Also discussed with CT operator Dr. Martinique regarding possibility of left circumflex CTO PCI.  However, we both agreed that, Risks do not outweigh benefits of any potential revascularization at this time. Continue aspirin, heparin, statin. I will attempt to introduce beta-blocker after placement of permanent pacemaker.   I had a long conversation with patient and her husband regarding review of her last hospitalization, current hospitalization, labile nature of her AV conduction, and issues with regards to AV block as well as CAD.  All questions answered to my best ability.  AKI: Prerenal.  Improving Cr down to 1.7. U o/p 1.8 L/24 hrs Will discontinue Foley  Type I DM: Patient has reportedly had clotting of her insulin pump ever since being on antiplatelet therapy and Eliquis.   I suspect this may be due to easy bleeding leading to ex vivo clotting in the insulin pump tubing.  Continue insulin as per CCM for now.  May need to discuss this with endocrinologist outpatient.   CRITICAL CARE Performed by: Vernell Leep   Total critical care time: 30 minutes   Critical care time was exclusive of separately billable procedures and treating other patients.   Critical care was necessary to treat or prevent imminent or life-threatening deterioration.   Critical care was time spent personally by me on the following activities: development of treatment plan with patient and/or surrogate as well as nursing, discussions with consultants, evaluation of patient's response to treatment, examination of patient, obtaining history from patient or surrogate, ordering and performing treatments and interventions, ordering and review of laboratory studies, ordering and review of radiographic studies, pulse oximetry and re-evaluation of patient's condition.        Nigel Mormon, MD Pager: [phone] Office: [phone]

## 2021-01-04 NOTE — Progress Notes (Signed)
NAME:  Colleen [name], MRN:  [id number], DOB:  [birth year], LOS: 2 ADMISSION DATE:  01/02/2021, CONSULTATION DATE:  4/10 REFERRING MD:  Dr. Eston Mould EDP , CHIEF COMPLAINT:  Heart block  History of Present Illness:  55 year old female with PMH as below, which is significant for DM1 on pump. She was recently admitted 3/27 > 4/7 for late presentation STEMI and was in AV block. She had transvenous pacemaker placed and was given time. AV block did improve and she was taken for [facility]. Multiple areas on occlusion. No PCI done. Plans for stress MRI as an outpatient. She presented to [facility] ED 4/10 early AM. She was having vomiting and lethargy at home and given her recent admit husband was worried for heart attack. Upon arrival was lethargic and bradycardic to the 30s. She was placed on transcutaneous pacing, but required high amperage to maintain capture and was intubated for comfort and airway considerations. She was taken to the cardiac cath lab where transvenous pacemaker was placed. PCCM asked to assist with vent/medical management.   Pertinent  Medical History   has a past medical history of Asthma, Complication of anesthesia, Diabetes mellitus, Diabetic macular edema ([place]), and Seasonal allergies.   Significant Hospital Events: Including procedures, antibiotic start and stop dates in addition to other pertinent events   . 4/10 admit for heart block, intubated. Transvenous pacemaker placed. Extubated in the afternoon. . 4/11 febrile, antibitoics started, weaning NE  Interim History / Subjective:  Today she still has a mild sore throat.  She had episode of nausea with diaphoresis overnight of the Zofran.  No longer planning on cMRI this admission.  Objective   Blood pressure 139/64, pulse 66, temperature 98.2 F (36.8 C), temperature source Oral, resp. rate 16, height 5\' 4"  (1.626 m), weight 97.1 kg, SpO2 99 %.        Intake/Output Summary (Last 24 hours) at 01/04/2021 0802 Last data filed  at 01/04/2021 0600 Gross per 24 hour  Intake 1881.53 ml  Output 2795 ml  Net -913.47 ml   Filed Weights   01/02/21 0500 01/03/21 0448 01/04/21 0500  Weight: 98.1 kg 105 kg 97.1 kg   Physical Exam: General: middle aged woman laying in bed in NAD HENT: [place]/AT Eyes: anicteric Respiratory: CTAB, breathing comfortably on RA, stronger today Cardiovascular: S1-S2, regular rate and rhythm, occasional PVCs on telemetry GI: soft, NT, ND Extremities: No significant peripheral edema, no clubbing or cyanosis.  Left foot warmer than the right - chronic per patient Derm: Skin warm, dry.  No rashes. Neuro: Awake and alert, answering questions appropriately, moving all extremities GU: foley   Labs/imaging that I have personally reviewed  (right click and "Reselect all SmartList Selections" daily)   Coox 73% BUN 43 Creatinine 1.74 WBC 6.7 H/H 9.0/28.4  Resolved Hospital Problem list     Assessment & Plan:   Complete heart block: in the setting of recent inferior MI, RCA lesion uncorrected. CAD - Transvenous pacemaker placed by Dr. Martinique.  To remain in place today until PPM placement.  -Continue aspirin, statin.  Beta-blocker on hold due to heart block and shock.  Cardiogenic shock secondary to CHB; chronic HFpEF -TVPM to remain in place -Continue norepinephrine as required to maintain MAP greater than 65- weaning off without significant change in coox. -Daily Coox, BNP -[facility] and EP following -Continue holding Plavix to reduce risk of bleeding during pacemaker implantation.  Acute blood loss anemia from right groin hematoma related to TVPM placement -  Continue to monitor clinically.   -Trend CBC -Transfuse for hemoglobin less than 7 or hemodynamically significant bleeding  Acute hypoxemic respiratory failure> resolved -Incentive spirometry, pulmonary hygiene  AKI with hyperphosphatemia: cardiorenal vs ATN from cardiogenic shock. Prior to admission, she reported  weight gain and started on diuretics. AKI improving. -Continue to maintain adequate renal perfusion -continue to monitor renal function daily. -Strict I/os  -Renally dose meds and avoid nephrotoxic meds.  IDDM with current hyperglycemia; on home insulin pump - CBG monitoring and SSI  -Continue long-acting insulin  Fever- unclear etiology. Concern for possible left pneumonia. -Continue empiric antibiotics -blood cx pending -sputum culture inadequate  Hyponatremia likely hypervolemic hyponatremia from heart failure -monitor -may need diuresis eventually, but renal function is limiting factor and she currently is not hypoxic  Husband updated at bedside during rounds.   Best practice (right click and "Reselect all SmartList Selections" daily)  Diet:  Oral Pain/Anxiety/Delirium protocol (if indicated): No VAP protocol (if indicated): Not indicated DVT prophylaxis: Systemic AC GI prophylaxis: PPI Glucose control:  SSI Yes Central venous access:  Yes, and it is still needed Arterial line:  N/A Foley:  N/A Mobility:  bed rest  PT consulted: N/A Last date of multidisciplinary goals of care discussion [ 4/12] Code Status:  full code Disposition: ICU  Labs   CBC: Recent Labs  Lab 12/29/20 0335 01/02/21 0136 01/02/21 0141 01/02/21 0248 01/02/21 0305 01/02/21 0406 01/02/21 1222 01/03/21 0527  WBC 5.3 9.5  --   --   --  21.6* 26.2* 15.1*  NEUTROABS  --  6.4  --   --   --   --   --   --   HGB 9.2* 10.6*   < > 11.9* 11.9* 11.2* 10.3* 9.3*  HCT 28.1* 35.0*   < > 35.0* 35.0* 34.4* 32.7* 28.6*  MCV 91.8 100.6*  --   --   --  92.7 94.8 92.3  PLT 103* 178  --   --   --  255 237 192   < > = values in this interval not displayed.    Basic Metabolic Panel: Recent Labs  Lab 12/29/20 0335 12/29/20 2585 12/30/20 0415 01/02/21 0136 01/02/21 0141 01/02/21 0248 01/02/21 0305 01/02/21 0406 01/03/21 0527 01/04/21 0727  NA  --    < > 134* 133* 130* 130* 129* 128* 130* 132*  K   --    < > 3.8 5.3* 5.1 4.8 4.8 5.1 3.8 4.3  CL  --    < > 100 94* 96* 97*  --  95* 98 102  CO2  --    < > 28 15*  --   --   --  17* 20* 24  GLUCOSE  --    < > 53* 313* 286* 329*  --  377* 209* 157*  BUN  --    < > 27* 55* 57* 54*  --  55* 60* 43*  CREATININE  --    < > 1.50* 3.34* 3.20* 3.10*  --  3.32* 2.73* 1.74*  CALCIUM  --    < > 8.4* 8.3*  --   --   --  7.8* 7.7* 8.0*  MG 1.9  --  1.9 2.1  --   --   --  1.9 1.7  --   PHOS  --   --   --   --   --   --   --  6.5* 4.9*  --    < > = values  in this interval not displayed.   GFR: Estimated Creatinine Clearance: 40.9 mL/min (A) (by C-G formula based on SCr of 1.74 mg/dL (H)). Recent Labs  Lab 01/02/21 0136 01/02/21 0406 01/02/21 0725 01/02/21 1222 01/03/21 0527  WBC 9.5 21.6*  --  26.2* 15.1*  LATICACIDVEN  --  4.9* 5.2* 4.5* 1.8    Liver Function Tests: Recent Labs  Lab 01/02/21 0136  AST 45*  ALT 81*  ALKPHOS 153*  BILITOT 1.7*  PROT 5.6*  ALBUMIN 2.9*   No results for input(s): LIPASE, AMYLASE in the last 168 hours. No results for input(s): AMMONIA in the last 168 hours.    This patient is critically ill with multiple organ system failure which requires frequent high complexity decision making, assessment, support, evaluation, and titration of therapies. This was completed through the application of advanced monitoring technologies and extensive interpretation of multiple databases. During this encounter critical care time was devoted to patient care services described in this note for 35 minutes.  Julian Hy, DO 01/04/21 1:07 PM [facility]  For contact information, see Amion. If no response to pager, please call PCCM consult pager. After hours, 7PM- 7AM, please call Elink.

## 2021-01-04 NOTE — Progress Notes (Signed)
[place] for IV heparin Indication: chest pain/ACS, also on Eliquis PTA for episode of afib last admit  Allergies  Allergen Reactions  . Other Other (See Comments)  . Succinylcholine Other (See Comments)    Pseudocholinesterase deficiency    Patient Measurements: Height: 5\' 4"  (162.6 cm) Weight: 97.1 kg (214 lb 1.1 oz) IBW/kg (Calculated) : 54.7 Heparin Dosing Weight: 77 kg  Vital Signs: Temp: 98.2 F (36.8 C) (04/12 0400) Temp Source: Oral (04/12 0400) BP: 115/68 (04/12 1030) Pulse Rate: 74 (04/12 1030)  Labs: Recent Labs    01/02/21 0136 01/02/21 0141 01/02/21 0406 01/02/21 0921 01/02/21 1222 01/03/21 0527 01/03/21 1802 01/04/21 0605 01/04/21 0727 01/04/21 1356  HGB 10.6*   < > 11.2*  --  10.3* 9.3*  --   --  9.0* 8.4*  HCT 35.0*   < > 34.4*  --  32.7* 28.6*  --   --  28.4* 26.3*  PLT 178  --  255  --  237 192  --   --  184 160  APTT 34  --  112*  --   --   --  153* 109*  --  78*  LABPROT 21.4*  --  22.5*  --   --   --   --   --   --   --   INR 1.9*  --  2.1*  --   --   --   --   --   --   --   HEPARINUNFRC  --   --   --   --   --   --  >2.20* 1.64*  --   --   CREATININE 3.34*   < > 3.32*  --   --  2.73*  --   --  1.74*  --   TROPONINIHS 462*  --  505* 887* 5,036*  --   --   --   --   --    < > = values in this interval not displayed.    Estimated Creatinine Clearance: 40.9 mL/min (A) (by C-G formula based on SCr of 1.74 mg/dL (H)).   Medical History: Past Medical History:  Diagnosis Date  . Asthma   . Complication of anesthesia    Psuedocholinerasterase deficiency per pt  . Diabetes mellitus    TYPE II  . Diabetic macular edema ([facility])    Pt gets injections in eyes with [place]  . Seasonal allergies     Medications:  Infusions:  . sodium chloride Stopped (01/02/21 0310)  . sodium chloride    . sodium chloride    . ceFEPime (MAXIPIME) IV 2 g (01/04/21 1017)  . heparin 1,200 Units/hr (01/04/21 1000)  .  norepinephrine (LEVOPHED) Adult infusion 3 mcg/min (01/04/21 1000)  . promethazine (PHENERGAN) injection (IM or IVPB)    . vancomycin 750 mg (01/04/21 1201)    Assessment: 55 yo female with recent Eliquis that was started last admission after an episode of afib.  Eliquis held so far this admission.  Now pharmacy asked to start IV heparin for r/o ACS with rising troponin.  Heparin level >2.2 on last check and aPTT now down to goal at 78s after rate adjustment this morning. No bleeding or IV issues noted.   Goal of Therapy:  Aptt goal 66-102s Heparin level 0.3-0.7 units/ml Monitor platelets by anticoagulation protocol: Yes   Plan:  -Continue heparin at 1200 units/hr -Recheck aPTT and heparin level in am  Erin Hearing PharmD., BCPS  Clinical Pharmacist 01/04/2021 2:52 PM

## 2021-01-04 NOTE — Progress Notes (Signed)
Pharmacy Antibiotic Note  Colleen [name] is a 55 y.o. female admitted on 01/02/2021 with symptomatic bradycardia, Nausea, vomiting.  Now with mild fever, suspected PNA.  Pharmacy has been consulted for vancomycin and cefepime dosing.  Renal function improved today with Scr 1.74, CrCl 40.9 ml/min. No fever since yesterday at noon. WBC down 6.7. Cultures ngtd. Random Vancomycin level drawn this morning therapeutic at 16.  Plan: Adjust vancomycin to 750 mg IV q24 hrs (est AUC 510) Adjust cefepime to 2 gm IV q12 hrs Monitor renal function, cultures/sensitivities, clinical progression   Height: 5\' 4"  (162.6 cm) Weight: 97.1 kg (214 lb 1.1 oz) IBW/kg (Calculated) : 54.7  Temp (24hrs), Avg:99.4 F (37.4 C), Min:98.2 F (36.8 C), Max:100.4 F (38 C)  Recent Labs  Lab 01/02/21 0136 01/02/21 0141 01/02/21 0248 01/02/21 0406 01/02/21 0725 01/02/21 1222 01/03/21 0527 01/04/21 0727  WBC 9.5  --   --  21.6*  --  26.2* 15.1* 6.7  CREATININE 3.34* 3.20* 3.10* 3.32*  --   --  2.73* 1.74*  LATICACIDVEN  --   --   --  4.9* 5.2* 4.5* 1.8  --     Estimated Creatinine Clearance: 40.9 mL/min (A) (by C-G formula based on SCr of 1.74 mg/dL (H)).    Allergies  Allergen Reactions  . Other Other (See Comments)  . Succinylcholine Other (See Comments)    Pseudocholinesterase deficiency    Antimicrobials this admission:  Vanc 4/11 >  Cefepime 4/11 >   Dose adjustments 4/12: VR 16 - adjust to vancomycin 750 mg IV q24 hr  Microbiology results:  4/11 BCx x 2 > ngtd 4/11 UCx > <10K insignif growth 4/11 Sputum Cx > cancelled  Richardine Service, PharmD, [place] PGY2 Cardiology Pharmacy Resident Phone: [phone] 01/04/2021  11:23 AM  Please check [email or web address] for unit-specific pharmacy phone numbers.

## 2021-01-04 NOTE — Progress Notes (Addendum)
Progress Note  Patient Name: Colleen [name] Date of Encounter: 01/04/2021  [facility] Cardiologist: Dr. Virgina Jock  Subjective   Feeling better today, no CP, no active nausea but did have some last PM  Inpatient Medications    Scheduled Meds: . aspirin  81 mg Oral Daily  . budesonide (PULMICORT) nebulizer solution  0.25 mg Nebulization BID  . chlorhexidine  60 mL Topical Once  . Chlorhexidine Gluconate Cloth  6 each Topical Q0600  . cholecalciferol  2,000 Units Oral Daily  . docusate sodium  100 mg Oral BID  . insulin aspart  0-5 Units Subcutaneous QHS  . insulin aspart  0-9 Units Subcutaneous TID WC  . insulin aspart  3 Units Subcutaneous TID WC  . insulin detemir  8 Units Subcutaneous BID  . pantoprazole  40 mg Oral Daily  . polyethylene glycol  17 g Oral Daily   Continuous Infusions: . sodium chloride Stopped (01/02/21 0310)  . sodium chloride    . sodium chloride    . ceFEPime (MAXIPIME) IV Stopped (01/03/21 1002)  . heparin 1,350 Units/hr (01/04/21 0600)  . norepinephrine (LEVOPHED) Adult infusion 4 mcg/min (01/04/21 0600)  . promethazine (PHENERGAN) injection (IM or IVPB)    . [START ON 01/05/2021] vancomycin     PRN Meds: acetaminophen, albuterol, docusate sodium, fentaNYL, ondansetron (ZOFRAN) IV, polyethylene glycol   Vital Signs    Vitals:   01/04/21 0300 01/04/21 0400 01/04/21 0500 01/04/21 0600  BP: (!) 131/56 116/64 127/63 139/64  Pulse: 71 70 72 66  Resp: 11 14 12 16   Temp:  98.2 F (36.8 C)    TempSrc:  Oral    SpO2: 99% 100% 98% 99%  Weight:   97.1 kg   Height:        Intake/Output Summary (Last 24 hours) at 01/04/2021 0744 Last data filed at 01/04/2021 0600 Gross per 24 hour  Intake 1890.59 ml  Output 2795 ml  Net -904.41 ml   Last 3 Weights 01/04/2021 01/03/2021 01/02/2021  Weight (lbs) 214 lb 1.1 oz 231 lb 7.7 oz 216 lb 4.3 oz  Weight (kg) 97.1 kg 105 kg 98.1 kg      Telemetry    SR 80's, rare V paced beats - Personally  Reviewed  ECG    No new EKGs - Personally Reviewed  Physical Exam   GEN: No acute distress.   Neck: No JVD Cardiac: RRR, no murmurs, rubs, or gallops.  Respiratory:  CTA b/l. GI: Soft, nontender, non-distended  MS: No edema; No deformity. Neuro:  Nonfocal  Psych: Normal affect   Labs    High Sensitivity Troponin:   Recent Labs  Lab 12/19/20 1154 01/02/21 0136 01/02/21 0406 01/02/21 0921 01/02/21 1222  TROPONINIHS >27,000* 462* 505* 887* 5,036*      Chemistry Recent Labs  Lab 01/02/21 0136 01/02/21 0141 01/02/21 0406 01/03/21 0527 01/04/21 0727  NA 133*   < > 128* 130* 132*  K 5.3*   < > 5.1 3.8 4.3  CL 94*   < > 95* 98 102  CO2 15*  --  17* 20* 24  GLUCOSE 313*   < > 377* 209* 157*  BUN 55*   < > 55* 60* 43*  CREATININE 3.34*   < > 3.32* 2.73* 1.74*  CALCIUM 8.3*  --  7.8* 7.7* 8.0*  PROT 5.6*  --   --   --   --   ALBUMIN 2.9*  --   --   --   --  AST 45*  --   --   --   --   ALT 81*  --   --   --   --   ALKPHOS 153*  --   --   --   --   BILITOT 1.7*  --   --   --   --   GFRNONAA 16*  --  16* 20* 34*  ANIONGAP 24*  --  16* 12 6   < > = values in this interval not displayed.     Hematology Recent Labs  Lab 01/02/21 0406 01/02/21 1222 01/03/21 0527  WBC 21.6* 26.2* 15.1*  RBC 3.71* 3.45* 3.10*  HGB 11.2* 10.3* 9.3*  HCT 34.4* 32.7* 28.6*  MCV 92.7 94.8 92.3  MCH 30.2 29.9 30.0  MCHC 32.6 31.5 32.5  RDW 14.4 14.4 14.5  PLT 255 237 192    BNP Recent Labs  Lab 01/02/21 0406 01/03/21 0815  BNP 275.4* 1,557.4*     DDimer No results for input(s): DDIMER in the last 168 hours.   Radiology    CARDIAC CATHETERIZATION Result Date: 01/02/2021 Successful insertion of temporary transvenous Pacemaker. Plan: Per CCM. Primary cardiology to reconsult EP service for consideration of permanent pacemaker.   DG CHEST PORT 1 VIEW Result Date: 01/03/2021 CLINICAL DATA:  Fever EXAM: PORTABLE CHEST 1 VIEW COMPARISON:  01/02/2021 FINDINGS: Interval  extubation. Pulmonary insufflation has been preserved. Lungs are clear. No pneumothorax or pleural effusion. Inferiorly approaching intravenous pacemaker lead is unchanged overlying the expected right ventricle toward the apex. Cardiac size within normal limits. Pulmonary vascularity is normal. No acute bone abnormality. IMPRESSION: Interval extubation with preservation of pulmonary insufflation. Lungs are clear. Electronically Signed   By: Fidela Salisbury MD   On: 01/03/2021 05:41      Cardiac Studies    12/24/2020: LHC  LV end diastolic pressure is normal.   LM: Normal LAD: Prox mod calcific 40% stenosis (Unchanges compared to cath 2018) LCx: Mid 100% occlusion, likely chronic. Reconstitution through retrograde filling from OM2 (New since cath 2018) RCA: Ostial 100% occlusion, likely the culprit for her STEMI with late presentation         Retrograde filling from LAD up to distal RCA (New since cath 2018)  RAL 12 mmHg LVEDP: 10 mmHg  I attempted wiring LCx to see if this lesion was CTO  Will discuss ischemia guided revascularization in future for LCx +/- LAD.   12/19/20; TTE IMPRESSIONS  1. Left ventricular ejection fraction, by estimation, is 55 to 60%. The  left ventricle has normal function. The left ventricle demonstrates  regional wall motion abnormalities (see scoring diagram/findings for  description). Left ventricular diastolic  parameters are indeterminate. There is mild hypokinesis of the left  ventricular, basal inferolateral wall.  2. Right ventricular systolic function is moderately reduced. The right  ventricular size is moderately enlarged. Estimated PASP 33 mmHg.  3. Right atrial size was mildly dilated.  4. Tricuspid valve regurgitation is severe.  5. The inferior vena cava is normal in size with <50% respiratory  variability, suggesting right atrial pressure of 8 mmHg.  6. Comapared to previous study report in 2018, LV/RV wall motion  abnormalities  are new.   Patient Profile     55 y.o. female w/PMHx of Type I DM admitted to [facility] about 3 days of N/V, had a syncopal event the day of her admission, she was found w/inferior STEMI and CHB, AKI, had emergent temp pacing wire placed, cath delayed 2/2 AKI  During the course of her hospital stay her conduction system did imporve and maintained 1:1 conduction last couple days of her hospitalization and not felt to require PPM implant She also did eventually get cath noting CTO Cx, ostial RCA 100% this felt to be her culprit and a 40% proc LAD lesion LVEF was preserved though had RV failure associated with her MI No intervention was done Planned for out patient stress MRI to assess ischemic territory, viability Discharged 12/30/20 ] She had some pAFib during her hospital stay and d/c on Eliquis (at least short term) with ASA, Plavix.  Readmitted yesterday with recurrent vomiting/lethargy at home, reports state EMS found her at some point bradycardic and required external pacing She was brought emergently to cath lab for temp wire She was also intubated for airway protection given sedation and comfort with external pacing  Subsequently extubated  Remains on levophed this AM  K+ 5.1 > 4.8 >> 5.1 >> 3.8 BUN/Creat 55/3.34 >> 60/2.73 Mag 2.1 AST 45 ALT 81  HS Trop 462 > 505 >  887 >> 5036  WBC 9.5 > 21.6 > 26.2 >> 15.2 H/H 10/35 >>> 9.3/28.6 Plts 178 >> 192  Her husband reports that Thursday/Friday after discharge last week she did minimal, feeling weak and tired but Saturday seemed to perk up and was doing well. Saturday had 2 "back to back" large, normal BMs She was in the recliner and mentioned that she felt weak and next time she got up wanted to go to the bed. She had 2-3 episodes of diarrhea, and he helped her to bed, She felt weak and her took her BP/HR, noting low BPsbut HR 60's-70's They reached out to her MD and were told to hold the entresto Starte to feel poorly, waek, and her  BP again low SBP 60's and HR more erratic 30's-70's She then developed nausea and had an episode of vomiting and that is when he called 911    Assessment & Plan    1. Recurrent Heart block, shock     EMS record reviewed     Had SR, Mobitz I > Mobitz II  As above with recent inferior STEMI had regained posy MI condiuction She again has a temp wire in place SR with 1:1 conduction this AM Had some competitive pacing Threshold today is 43mA and left at 70mA and 40bpm this AM  Still on levophed at 4, BP looks good, can work on wean this AM  2. Leukocytosis     low grade 100.4 temp again yest afternoon     Afebrile this AM     BC pending     CBC pending     Sputum sample not usable     Urine Cx pending     ? Reactive  3. CAD      As above recent inf STEMI      Primary symptoms N/V with her priro MI      Initially this presentation was N/V lethargy it seems and initially was found in SR      HS trop trended trending upwards      Some nausea last night      On ASA and hep gtt, off plavix (for pacer)  As noted and discussed with Dr. Virgina Jock, no plans for ischemic w/u or attempts at CTO at this time.   4. PAfib     None here     Eliquis on hold     4. AKI, CKD  Creat better this am 1.74        We will follow fever curve, labs (BMET and CBC pending) and BC tentatively planned for Thursday Dr. Quentin Ore has seen and discussed with the patient plans      For questions or updates, please contact [facility] Please consult [email or web address] for contact info under        Signed, Baldwin Jamaica, PA-C  01/04/2021, 7:44 AM

## 2021-01-05 ENCOUNTER — Ambulatory Visit: Payer: 59 | Admitting: Cardiology

## 2021-01-05 DIAGNOSIS — R570 Cardiogenic shock: Secondary | ICD-10-CM | POA: Diagnosis not present

## 2021-01-05 DIAGNOSIS — I442 Atrioventricular block, complete: Secondary | ICD-10-CM | POA: Diagnosis not present

## 2021-01-05 DIAGNOSIS — I2510 Atherosclerotic heart disease of native coronary artery without angina pectoris: Secondary | ICD-10-CM

## 2021-01-05 LAB — BASIC METABOLIC PANEL
Anion gap: 4 — ABNORMAL LOW (ref 5–15)
BUN: 35 mg/dL — ABNORMAL HIGH (ref 6–20)
CO2: 25 mmol/L (ref 22–32)
Calcium: 8 mg/dL — ABNORMAL LOW (ref 8.9–10.3)
Chloride: 102 mmol/L (ref 98–111)
Creatinine, Ser: 1.53 mg/dL — ABNORMAL HIGH (ref 0.44–1.00)
GFR, Estimated: 40 mL/min — ABNORMAL LOW (ref >60)
Glucose, Bld: 187 mg/dL — ABNORMAL HIGH (ref 70–99)
Potassium: 4.8 mmol/L (ref 3.5–5.1)
Sodium: 131 mmol/L — ABNORMAL LOW (ref 135–145)

## 2021-01-05 LAB — APTT: aPTT: 77 seconds — ABNORMAL HIGH (ref 24–36)

## 2021-01-05 LAB — CBC
HCT: 27.7 % — ABNORMAL LOW (ref 36.0–46.0)
Hemoglobin: 8.8 g/dL — ABNORMAL LOW (ref 12.0–15.0)
MCH: 29.8 pg (ref 26.0–34.0)
MCHC: 31.8 g/dL (ref 30.0–36.0)
MCV: 93.9 fL (ref 80.0–100.0)
Platelets: 175 10*3/uL (ref 150–400)
RBC: 2.95 MIL/uL — ABNORMAL LOW (ref 3.87–5.11)
RDW: 14.3 % (ref 11.5–15.5)
WBC: 4.3 10*3/uL (ref 4.0–10.5)
nRBC: 0 % (ref 0.0–0.2)

## 2021-01-05 LAB — GLUCOSE, CAPILLARY
Glucose-Capillary: 118 mg/dL — ABNORMAL HIGH (ref 70–99)
Glucose-Capillary: 142 mg/dL — ABNORMAL HIGH (ref 70–99)
Glucose-Capillary: 240 mg/dL — ABNORMAL HIGH (ref 70–99)
Glucose-Capillary: 234 mg/dL — ABNORMAL HIGH (ref 70–99)

## 2021-01-05 LAB — HEPARIN LEVEL (UNFRACTIONATED): Heparin Unfractionated: 1.01 IU/mL — ABNORMAL HIGH (ref 0.30–0.70)

## 2021-01-05 LAB — BRAIN NATRIURETIC PEPTIDE: B Natriuretic Peptide: 1578.7 pg/mL — ABNORMAL HIGH (ref 0.0–100.0)

## 2021-01-05 MED ORDER — ALBUTEROL SULFATE HFA 108 (90 BASE) MCG/ACT IN AERS
1.0000 | INHALATION_SPRAY | Freq: Four times a day (QID) | RESPIRATORY_TRACT | Status: DC | PRN
  Administered 2021-01-05 – 2021-01-06 (×4): 1 via RESPIRATORY_TRACT
  Filled 2021-01-05: qty 6.7

## 2021-01-05 MED ORDER — SODIUM CHLORIDE 0.9 % IV SOLN: INTRAVENOUS | Status: DC

## 2021-01-05 MED ORDER — SODIUM CHLORIDE 0.9 % IV SOLN
2.0000 g | INTRAVENOUS | Status: AC
  Administered 2021-01-05 – 2021-01-07 (×3): 2 g via INTRAVENOUS
  Filled 2021-01-05 (×3): qty 20

## 2021-01-05 MED ORDER — CEFAZOLIN SODIUM-DEXTROSE 2-4 GM/100ML-% IV SOLN
2.0000 g | INTRAVENOUS | Status: AC
  Administered 2021-01-06: 2 g via INTRAVENOUS
  Filled 2021-01-05: qty 100

## 2021-01-05 MED ORDER — CHLORHEXIDINE GLUCONATE 4 % EX LIQD
60.0000 mL | Freq: Once | CUTANEOUS | Status: AC
  Administered 2021-01-05: 4 via TOPICAL

## 2021-01-05 MED ORDER — INSULIN ASPART 100 UNIT/ML ~~LOC~~ SOLN
5.0000 [IU] | Freq: Three times a day (TID) | SUBCUTANEOUS | Status: DC
  Administered 2021-01-05 – 2021-01-10 (×12): 5 [IU] via SUBCUTANEOUS

## 2021-01-05 MED ORDER — CHLORHEXIDINE GLUCONATE 4 % EX LIQD
60.0000 mL | Freq: Once | CUTANEOUS | Status: AC
  Administered 2021-01-06: 4 via TOPICAL
  Filled 2021-01-05: qty 60

## 2021-01-05 MED ORDER — SODIUM CHLORIDE 0.9 % IV SOLN
250.0000 mL | INTRAVENOUS | Status: DC
  Administered 2021-01-06: 250 mL via INTRAVENOUS

## 2021-01-05 MED ORDER — SODIUM CHLORIDE 0.9% FLUSH
3.0000 mL | Freq: Two times a day (BID) | INTRAVENOUS | Status: DC
  Administered 2021-01-05 – 2021-01-09 (×10): 3 mL via INTRAVENOUS

## 2021-01-05 MED ORDER — SODIUM CHLORIDE 0.9% FLUSH: 3.0000 mL | INTRAVENOUS | Status: DC | PRN

## 2021-01-05 MED ORDER — ALBUTEROL SULFATE (2.5 MG/3ML) 0.083% IN NEBU: 3.0000 mL | INHALATION_SOLUTION | Freq: Four times a day (QID) | RESPIRATORY_TRACT | Status: DC | PRN

## 2021-01-05 MED ORDER — SODIUM CHLORIDE 0.9 % IV SOLN
80.0000 mg | INTRAVENOUS | Status: AC
  Administered 2021-01-06: 80 mg
  Filled 2021-01-05: qty 2

## 2021-01-05 MED ORDER — FUROSEMIDE 10 MG/ML IJ SOLN
40.0000 mg | Freq: Every day | INTRAMUSCULAR | Status: DC
  Administered 2021-01-05: 40 mg via INTRAVENOUS
  Filled 2021-01-05: qty 4

## 2021-01-05 NOTE — Progress Notes (Signed)
eLink Physician-Brief Progress Note Patient Name: Colleen [name] DOB: 12-08-1965 MRN: [id number]   Date of Service  01/05/2021  HPI/Events of Note  Request to change Albuterol Nebs to Albuterol MDI  eICU Interventions  Plan: 1. D/C Albuterol Nebs. 2. Albuterol MDI 2 puffs Q 6 hours PRN SOB or wheezing.      Intervention Category Major Interventions: Other:  Lysle Dingwall 01/05/2021, 7:37 PM

## 2021-01-05 NOTE — Progress Notes (Signed)
Subjective:  Feels well No chest pain  1:1 AV conduction on telemetry Few episodes of sinus arrhtymia Pacemaker in place Rt groin Off levophed  Objective:  Vital Signs in the last 24 hours: Temp:  [97.9 F (36.6 C)-98.4 F (36.9 C)] 97.9 F (36.6 C) (04/12 2000) Pulse Rate:  [58-79] 72 (04/13 0745) Resp:  [10-20] 18 (04/13 0745) BP: (75-129)/(51-84) 103/73 (04/13 0745) SpO2:  [94 %-100 %] 95 % (04/13 0745) Weight:  [100.6 kg] 100.6 kg (04/13 0425)  Intake/Output from previous day: 04/12 0701 - 04/13 0700 In: 698.1 [I.V.:348.2; IV Piggyback:349.9] Out: 8242 [Urine:1745]  Physical Exam Vitals and nursing note reviewed.  Constitutional:      General: She is not in acute distress.    Appearance: She is well-developed.  HENT:     Head: Normocephalic and atraumatic.  Eyes:     Conjunctiva/sclera: Conjunctivae normal.     Pupils: Pupils are equal, round, and reactive to light.  Neck:     Vascular: No JVD.  Cardiovascular:     Rate and Rhythm: Normal rate and regular rhythm.     Pulses: Normal pulses and intact distal pulses.     Heart sounds: No murmur heard.     Comments: Pacemaker in place Rt groin Pulmonary:     Effort: Pulmonary effort is normal.     Breath sounds: Normal breath sounds. No wheezing or rales.  Abdominal:     General: Bowel sounds are normal.     Palpations: Abdomen is soft.     Tenderness: There is no rebound.  Musculoskeletal:        General: No tenderness. Normal range of motion.     Right lower leg: Edema (2+) present.     Left lower leg: Edema (2+) present.  Lymphadenopathy:     Cervical: No cervical adenopathy.  Skin:    General: Skin is warm and dry.  Neurological:     Mental Status: She is alert and oriented to person, place, and time.     Cranial Nerves: No cranial nerve deficit.      Lab Results: BMP Recent Labs    01/03/21 0527 01/04/21 0727 01/05/21 0035  NA 130* 132* 131*  K 3.8 4.3 4.8  CL 98 102 102  CO2 20* 24 25   GLUCOSE 209* 157* 187*  BUN 60* 43* 35*  CREATININE 2.73* 1.74* 1.53*  CALCIUM 7.7* 8.0* 8.0*  GFRNONAA 20* 34* 40*    CBC Recent Labs  Lab 01/02/21 0136 01/02/21 0141 01/05/21 0035  WBC 9.5   < > 4.3  RBC 3.48*   < > 2.95*  HGB 10.6*   < > 8.8*  HCT 35.0*   < > 27.7*  PLT 178   < > 175  MCV 100.6*   < > 93.9  MCH 30.5   < > 29.8  MCHC 30.3   < > 31.8  RDW 14.5   < > 14.3  LYMPHSABS 2.3  --   --   MONOABS 0.7  --   --   EOSABS 0.1  --   --   BASOSABS 0.0  --   --    < > = values in this interval not displayed.    HEMOGLOBIN A1C Lab Results  Component Value Date   HGBA1C 7.5 (H) 01/02/2021   MPG 168.55 01/02/2021    Cardiac Panel (last 3 results) No results for input(s): CKTOTAL, CKMB, TROPONINI, RELINDX in the last 8760 hours.  BNP (last 3 results) Recent  Labs    01/02/21 0406 01/03/21 0815 01/05/21 0035  BNP 275.4* 1,557.4* 1,578.7*    TSH Recent Labs    12/21/20 1012  TSH 1.862    Lipid Panel     Component Value Date/Time   CHOL 118 01/02/2021 0136   CHOL 164 10/08/2019 0901   TRIG 80 01/03/2021 0527   HDL 42 01/02/2021 0136   HDL 99 10/08/2019 0901   CHOLHDL 2.8 01/02/2021 0136   VLDL 42 (H) 01/02/2021 0136   LDLCALC 34 01/02/2021 0136   LDLCALC 54 10/08/2019 0901   LDLDIRECT 61 10/08/2019 0901     Hepatic Function Panel Recent Labs    12/20/20 0415 12/21/20 0328 12/23/20 0248 12/27/20 0941 01/02/21 0136  PROT 5.6*   < > 5.1* 5.7* 5.6*  ALBUMIN 3.4*   < > 2.8* 2.8* 2.9*  AST 4,893*   < > 628* 61* 45*  ALT 2,931*   < > 1,379* 343* 81*  ALKPHOS 114   < > 104 152* 153*  BILITOT 2.0*   < > 2.2* 1.9* 1.7*  BILIDIR 0.5*  --   --  0.5*  --   IBILI 1.5*  --   --  1.4*  --    < > = values in this interval not displayed.    Cardiac Studies:  EKG 01/02/2021: Sinus rhythm 97 bpm Inferior infarct, age indeterminate  Echocardiogram 01/02/2021: 1. LIMITED ECHOCARDIOGRAM; Full Study dated 12/19/2020.  2. Left ventricular ejection  fraction, by estimation, is 50 to 55%. The  left ventricle has low normal function. Diastolic function not assessed  due to limited study.  3. Right ventricular systolic function is mildly reduced. The right  ventricular size is mildly enlarged. There is mildly elevated pulmonary  artery systolic pressure. The estimated right ventricular systolic  pressure is 65.5 mmHg.  4. Left atrial size was grossly normal in size.  5. The pericardial effusion is posterior to the left ventricle.  6. The mitral valve is grossly normal. No evidence of mitral valve  regurgitation. No evidence of mitral stenosis.  7. The aortic valve is grossly normal.  8. The inferior vena cava is dilated in size with >50% respiratory  variability, suggesting right atrial pressure of 8 mmHg.   Coronary angiography 12/24/2020: LM: Normal LAD: Prox mod calcific 40% stenosis (Unchanges compared to cath 2018) LCx: Mid 100% occlusion, likely chronic. Reconstitution through retrograde filling from OM2 (New since cath 2018) RCA: Ostial 100% occlusion, likely the culprit for her STEMI with late presentation Retrograde filling from LAD up to distal RCA (New since cath 2018)  RAL 12 mmHg LVEDP: 10 mmHg Assessment & Recommendations:  55 y.o.Caucasianfemale,pharmacist by profession,withtype 1 diabetes mellitus, inferior STEMI (11/2020) with no revascularization due to late presentation and AKI, complicated by completete AV block, resolved with 1:1 conduction on discharge, returns with asystolic cardiac arrest  Cardiac arrest: Secondary to asystole, spontaneously resolved on arrival to Cath Lab. Given presentation, temporary pacemaker was placed. Required intubation for airway protection, now extubated Will need permanent pacemaker before discharge, given labile AV conduction, planned for 01/06/2021 Fever resolved Appreciate EP input. Management of possible pneumonia as per CCM.  Cardiogenic  shock: Secondary to complete AV block and asystole, now resolved. Off Levophed.  Right heart failure: 2+ leg edema. BNP 1578 Start lasix 40 mg IV daily  Type II MI: Recent inferior infarct with residual mid circumflex CTO and moderate mid LAD disease.  Now presented with asystole, cardiogenic shock, leading to troponin elevation. Recommend IV  heparin, aspirin for now. Holding Plavix and Eliquis given upcoming pacemaker placement.  Coronary artery disease: Acute inferior STEMI with ostial RCA occlusion in 11/2020. Mid circumflex CTO and mid LAD 70% stenosis. My initial plan was to perform outpatient cardiac MRI with stress perfusion to evaluate for both ischemia and viability.  However, patient is now admitted with asystole, currently has temporary pacemaker and will require permanent pacemaker.  Given her labile AV conduction, I do not think it is worth the risk to remove the temporary pacemaker for a cardiac MRI. At this time, her primary issue is AV conduction for which she will need permanent pacemaker regardless.  Also discussed with CT operator Dr. Martinique regarding possibility of left circumflex CTO PCI.  However, we both agreed that, Risks do not outweigh benefits of any potential revascularization at this time. Continue aspirin, heparin, statin. I will attempt to introduce beta-blocker after placement of permanent pacemaker.   I had a long conversation with patient and her husband regarding review of her last hospitalization, current hospitalization, labile nature of her AV conduction, and issues with regards to AV block as well as CAD.  All questions answered to my best ability.  AKI: Prerenal.  Resolving Cr down to 1.53. U o/p 1.7 L/24 hrs  Type I DM: Patient has reportedly had clotting of her insulin pump ever since being on antiplatelet therapy and Eliquis.  I suspect this may be due to easy bleeding leading to ex vivo clotting in the insulin pump tubing.  Continue insulin as  per CCM for now.  May need to discuss this with endocrinologist outpatient.   CRITICAL CARE Performed by: Vernell Leep   Total critical care time: 30 minutes   Critical care time was exclusive of separately billable procedures and treating other patients.   Critical care was necessary to treat or prevent imminent or life-threatening deterioration.   Critical care was time spent personally by me on the following activities: development of treatment plan with patient and/or surrogate as well as nursing, discussions with consultants, evaluation of patient's response to treatment, examination of patient, obtaining history from patient or surrogate, ordering and performing treatments and interventions, ordering and review of laboratory studies, ordering and review of radiographic studies, pulse oximetry and re-evaluation of patient's condition.        Nigel Mormon, MD Pager: [phone] Office: [phone]

## 2021-01-05 NOTE — Progress Notes (Signed)
[employer] for IV heparin Indication: chest pain/ACS, also on Eliquis PTA for episode of afib last admit  Allergies  Allergen Reactions  . Other Other (See Comments)  . Succinylcholine Other (See Comments)    Pseudocholinesterase deficiency    Patient Measurements: Height: 5\' 4"  (162.6 cm) Weight: 100.6 kg (221 lb 12.5 oz) IBW/kg (Calculated) : 54.7 Heparin Dosing Weight: 77 kg  Vital Signs: BP: 103/73 (04/13 0745) Pulse Rate: 72 (04/13 0745)  Labs: Recent Labs    01/02/21 1222 01/03/21 0527 01/03/21 1802 01/03/21 1802 01/04/21 0605 01/04/21 0727 01/04/21 1356 01/05/21 0035  HGB 10.3* 9.3*  --   --   --  9.0* 8.4* 8.8*  HCT 32.7* 28.6*  --   --   --  28.4* 26.3* 27.7*  PLT 237 192  --   --   --  184 160 175  APTT  --   --  153*   < > 109*  --  78* 77*  HEPARINUNFRC  --   --  >2.20*  --  1.64*  --   --  1.01*  CREATININE  --  2.73*  --   --   --  1.74*  --  1.53*  TROPONINIHS 5,036*  --   --   --   --   --   --   --    < > = values in this interval not displayed.    Estimated Creatinine Clearance: 47.4 mL/min (A) (by C-G formula based on SCr of 1.53 mg/dL (H)).   Medical History: Past Medical History:  Diagnosis Date  . Asthma   . Complication of anesthesia    Psuedocholinerasterase deficiency per pt  . Diabetes mellitus    TYPE II  . Diabetic macular edema (HCC)    Pt gets injections in eyes with [facility]  . Seasonal allergies     Medications:  Infusions:  . sodium chloride Stopped (01/02/21 0310)  . sodium chloride    . sodium chloride    . [START ON 01/06/2021] sodium chloride    . sodium chloride    .  ceFAZolin (ANCEF) IV    . cefTRIAXone (ROCEPHIN)  IV    . heparin 1,200 Units/hr (01/05/21 0800)  . norepinephrine (LEVOPHED) Adult infusion Stopped (01/05/21 0626)  . promethazine (PHENERGAN) injection (IM or IVPB)      Assessment: 55 yo female with recent Eliquis that was started last admission after an  episode of afib.  Eliquis held so far this admission.  Now pharmacy asked to start IV heparin for r/o ACS with rising troponin.  APTT therapeutic at 77 sec, on drip rate 1200 units/hr. Heparin level still falsely elevated given recent Eliquis. No bleeding or IV issues noted. Plans noted for PPM placement tomorrow, will stop heparin at midnight tonight  Goal of Therapy:  Aptt goal 66-102s Heparin level 0.3-0.7 units/ml Monitor platelets by anticoagulation protocol: Yes   Plan:  -Continue heparin at 1200 units/hr until midnight tonight -F/u [facility] plans postop   Richardine Service, PharmD, BCPS PGY2 Cardiology Pharmacy Resident Phone: [phone] 01/05/2021  10:44 AM  Please check [email or web address] for unit-specific pharmacy phone numbers.

## 2021-01-05 NOTE — Progress Notes (Signed)
Inpatient Diabetes Program Recommendations  AACE/ADA: New Consensus Statement on Inpatient Glycemic Control   Target Ranges:  Prepandial:   less than 140 mg/dL      Peak postprandial:   less than 180 mg/dL (1-2 hours)      Critically ill patients:  140 - 180 mg/dL   Results for Colleen [name], Colleen [name] (MRN [id number]) as of 01/05/2021 10:06  Ref. Range 01/04/2021 06:21 01/04/2021 11:32 01/04/2021 16:25 01/04/2021 17:58 01/04/2021 21:09 01/05/2021 08:17  Glucose-Capillary Latest Ref Range: 70 - 99 mg/dL 152 (H) 204 (H) 243 (H) 296 (H) 273 (H) 118 (H)   Review of Glycemic Control  Diabetes history: DM1 (makes NO insulin; requires basal, correction, and carb coverage insulin) Outpatient Diabetes medications: Medtronic insulin pump with Humalog Current orders for Inpatient glycemic control: Levemir 8 units BID, Novolog 0-9 units TId with meals, Novolog 0-5 units QHS, Novolog 3 units TID with meals  Inpatient Diabetes Program Recommendations:    Insulin:  Please consider increasing Levemir to 9 units BID and increasing meal coverage to Novolog 5 units TID with meals if patient eats at least 50% of meals.  Thanks, Barnie Alderman, RN, MSN, CDE Diabetes Coordinator Inpatient Diabetes Program [phone] (Team Pager from 8am to 5pm)

## 2021-01-05 NOTE — Progress Notes (Addendum)
Progress Note  Patient Name: Colleen [name] Date of Encounter: 01/05/2021  [facility] Cardiologist: Dr. Virgina Jock  Subjective   no CP, feels better Yesterday had some unclear back pain, ?CP Self resolved quickly, evaluated by Dr. Virgina Jock, sheath/temp wire stable ? musculoskeletal Not recurrent  Inpatient Medications    Scheduled Meds: . aspirin  81 mg Oral Daily  . budesonide (PULMICORT) nebulizer solution  0.25 mg Nebulization BID  . chlorhexidine  60 mL Topical Once  . Chlorhexidine Gluconate Cloth  6 each Topical Q0600  . cholecalciferol  2,000 Units Oral Daily  . docusate sodium  100 mg Oral BID  . insulin aspart  0-5 Units Subcutaneous QHS  . insulin aspart  0-9 Units Subcutaneous TID WC  . insulin aspart  3 Units Subcutaneous TID WC  . insulin detemir  8 Units Subcutaneous BID  . pantoprazole  40 mg Oral Daily  . polyethylene glycol  17 g Oral Daily   Continuous Infusions: . sodium chloride Stopped (01/02/21 0310)  . sodium chloride    . sodium chloride    . ceFEPime (MAXIPIME) IV Stopped (01/04/21 2154)  . heparin 1,200 Units/hr (01/05/21 0700)  . norepinephrine (LEVOPHED) Adult infusion Stopped (01/05/21 0626)  . promethazine (PHENERGAN) injection (IM or IVPB)    . vancomycin Stopped (01/04/21 1346)   PRN Meds: acetaminophen, albuterol, docusate sodium, fentaNYL, guaiFENesin-dextromethorphan, ondansetron (ZOFRAN) IV, polyethylene glycol   Vital Signs    Vitals:   01/05/21 0630 01/05/21 0645 01/05/21 0700 01/05/21 0715  BP: 105/62 107/66 95/64 98/67   Pulse: 71 (!) 58 69 73  Resp: 18 18 18 16   Temp:      TempSrc:      SpO2: 100% 100% 100% 100%  Weight:      Height:        Intake/Output Summary (Last 24 hours) at 01/05/2021 0746 Last data filed at 01/05/2021 0700 Gross per 24 hour  Intake 698.08 ml  Output 1745 ml  Net -1046.92 ml   Last 3 Weights 01/05/2021 01/04/2021 01/03/2021  Weight (lbs) 221 lb 12.5 oz 214 lb 1.1 oz 231 lb 7.7 oz   Weight (kg) 100.6 kg 97.1 kg 105 kg      Telemetry    SR 80's, rare V paced beats - Personally Reviewed  ECG    No new EKGs - Personally Reviewed  Physical Exam   unchanged GEN: No acute distress.   Neck: No JVD Cardiac: RRR, no murmurs, rubs, or gallops.  Respiratory:  CTA b/l. GI: Soft, nontender, non-distended  MS: No edema; No deformity. Neuro:  Nonfocal  Psych: Normal affect   Labs    High Sensitivity Troponin:   Recent Labs  Lab 12/19/20 1154 01/02/21 0136 01/02/21 0406 01/02/21 0921 01/02/21 1222  TROPONINIHS >27,000* 462* 505* 887* 5,036*      Chemistry Recent Labs  Lab 01/02/21 0136 01/02/21 0141 01/03/21 0527 01/04/21 0727 01/05/21 0035  NA 133*   < > 130* 132* 131*  K 5.3*   < > 3.8 4.3 4.8  CL 94*   < > 98 102 102  CO2 15*   < > 20* 24 25  GLUCOSE 313*   < > 209* 157* 187*  BUN 55*   < > 60* 43* 35*  CREATININE 3.34*   < > 2.73* 1.74* 1.53*  CALCIUM 8.3*   < > 7.7* 8.0* 8.0*  PROT 5.6*  --   --   --   --   ALBUMIN 2.9*  --   --   --   --  AST 45*  --   --   --   --   ALT 81*  --   --   --   --   ALKPHOS 153*  --   --   --   --   BILITOT 1.7*  --   --   --   --   GFRNONAA 16*   < > 20* 34* 40*  ANIONGAP 24*   < > 12 6 4*   < > = values in this interval not displayed.     Hematology Recent Labs  Lab 01/04/21 0727 01/04/21 1356 01/05/21 0035  WBC 6.7 5.4 4.3  RBC 3.04* 2.82* 2.95*  HGB 9.0* 8.4* 8.8*  HCT 28.4* 26.3* 27.7*  MCV 93.4 93.3 93.9  MCH 29.6 29.8 29.8  MCHC 31.7 31.9 31.8  RDW 14.5 14.4 14.3  PLT 184 160 175    BNP Recent Labs  Lab 01/02/21 0406 01/03/21 0815 01/05/21 0035  BNP 275.4* 1,557.4* 1,578.7*     DDimer No results for input(s): DDIMER in the last 168 hours.   Radiology    CARDIAC CATHETERIZATION Result Date: 01/02/2021 Successful insertion of temporary transvenous Pacemaker. Plan: Per CCM. Primary cardiology to reconsult EP service for consideration of permanent pacemaker.   DG CHEST PORT  1 VIEW Result Date: 01/03/2021 CLINICAL DATA:  Fever EXAM: PORTABLE CHEST 1 VIEW COMPARISON:  01/02/2021 FINDINGS: Interval extubation. Pulmonary insufflation has been preserved. Lungs are clear. No pneumothorax or pleural effusion. Inferiorly approaching intravenous pacemaker lead is unchanged overlying the expected right ventricle toward the apex. Cardiac size within normal limits. Pulmonary vascularity is normal. No acute bone abnormality. IMPRESSION: Interval extubation with preservation of pulmonary insufflation. Lungs are clear. Electronically Signed   By: Fidela Salisbury MD   On: 01/03/2021 05:41      Cardiac Studies    12/24/2020: LHC  LV end diastolic pressure is normal.   LM: Normal LAD: Prox mod calcific 40% stenosis (Unchanges compared to cath 2018) LCx: Mid 100% occlusion, likely chronic. Reconstitution through retrograde filling from OM2 (New since cath 2018) RCA: Ostial 100% occlusion, likely the culprit for her STEMI with late presentation         Retrograde filling from LAD up to distal RCA (New since cath 2018)  RAL 12 mmHg LVEDP: 10 mmHg  I attempted wiring LCx to see if this lesion was CTO  Will discuss ischemia guided revascularization in future for LCx +/- LAD.   12/19/20; TTE IMPRESSIONS  1. Left ventricular ejection fraction, by estimation, is 55 to 60%. The  left ventricle has normal function. The left ventricle demonstrates  regional wall motion abnormalities (see scoring diagram/findings for  description). Left ventricular diastolic  parameters are indeterminate. There is mild hypokinesis of the left  ventricular, basal inferolateral wall.  2. Right ventricular systolic function is moderately reduced. The right  ventricular size is moderately enlarged. Estimated PASP 33 mmHg.  3. Right atrial size was mildly dilated.  4. Tricuspid valve regurgitation is severe.  5. The inferior vena cava is normal in size with <50% respiratory  variability,  suggesting right atrial pressure of 8 mmHg.  6. Comapared to previous study report in 2018, LV/RV wall motion  abnormalities are new.   Patient Profile     55 y.o. female w/PMHx of Type I DM admitted to [facility] about 3 days of N/V, had a syncopal event the day of her admission, she was found w/inferior STEMI and CHB, AKI, had emergent temp pacing wire  placed, cath delayed 2/2 AKI  During the course of her hospital stay her conduction system did imporve and maintained 1:1 conduction last couple days of her hospitalization and not felt to require PPM implant She also did eventually get cath noting CTO Cx, ostial RCA 100% this felt to be her culprit and a 40% proc LAD lesion LVEF was preserved though had RV failure associated with her MI No intervention was done Planned for out patient stress MRI to assess ischemic territory, viability Discharged 12/30/20 ] She had some pAFib during her hospital stay and d/c on Eliquis (at least short term) with ASA, Plavix.  Readmitted yesterday with recurrent vomiting/lethargy at home, reports state EMS found her at some point bradycardic and required external pacing She was brought emergently to cath lab for temp wire She was also intubated for airway protection given sedation and comfort with external pacing  Subsequently extubated  Remains on levophed this AM  K+ 5.1 > 4.8 >> 5.1 >> 3.8 BUN/Creat 55/3.34 >> 60/2.73 Mag 2.1 AST 45 ALT 81  HS Trop 462 > 505 >  887 >> 5036  WBC 9.5 > 21.6 > 26.2 >> 15.2 H/H 10/35 >>> 9.3/28.6 Plts 178 >> 192  Her husband reports that Thursday/Friday after discharge last week she did minimal, feeling weak and tired but Saturday seemed to perk up and was doing well. Saturday had 2 "back to back" large, normal BMs She was in the recliner and mentioned that she felt weak and next time she got up wanted to go to the bed. She had 2-3 episodes of diarrhea, and he helped her to bed, She felt weak and her took her BP/HR,  noting low BPsbut HR 60's-70's They reached out to her MD and were told to hold the entresto Starte to feel poorly, waek, and her BP again low SBP 60's and HR more erratic 30's-70's She then developed nausea and had an episode of vomiting and that is when he called 911    Assessment & Plan    1. Recurrent Heart block, shock     EMS record reviewed     Had SR, Mobitz I > Mobitz II  As above with recent inferior STEMI had regained posy MI condiuction She again has a temp wire in place SR with 1:1 conduction this AM Had some rare paced beats and evidence og heart block occasionally on tele  off levo this AM  2. Leukocytosis     low grade 100.4 temp again yest afternoon     Afebrile for 24 hours     BC x2, neg one day     WBC wnl last 2 days     Sputum sample not usable     Urine Cx neg     ? Reactive  No symptoms of illness  3. CAD      As above recent inf STEMI      Primary symptoms N/V with her priro MI      Initially this presentation was N/V lethargy it seems and initially was found in SR      HS trop trended trending upwards      Some nausea last night      On ASA and hep gtt, off plavix (for pacer)  As noted and discussed with Dr. Virgina Jock, no plans for ischemic w/u or attempts at CTO at this time.   Stop heparin MN tonight  4. PAfib     None here     Eliquis on  hold     4. AKI, CKD     Creat continues to improve  this am 1.53       Dr. Quentin Ore has seen and discussed with the patient plans, revisited procedure, potential risks and benefits, she remains agreeable to proceed Will plan for tomorrow if [facility] remain neg to then      For questions or updates, please contact [place] Please consult [email or web address] for contact info under        Signed, Baldwin [place], PA-C  01/05/2021, 7:46 AM

## 2021-01-05 NOTE — Plan of Care (Signed)

## 2021-01-05 NOTE — Progress Notes (Signed)
NAME:  Colleen [name], MRN:  [id number], DOB:  [birth year], LOS: 3 ADMISSION DATE:  01/02/2021, CONSULTATION DATE:  4/10 REFERRING MD:  Dr. Eston Mould EDP , CHIEF COMPLAINT:  Heart block  History of Present Illness:  55 year old female with PMH as below, which is significant for DM1 on pump. She was recently admitted 3/27 > 4/7 for late presentation STEMI and was in AV block. She had transvenous pacemaker placed and was given time. AV block did improve and she was taken for [facility]. Multiple areas on occlusion. No PCI done. Plans for stress MRI as an outpatient. She presented to [facility] ED 4/10 early AM. She was having vomiting and lethargy at home and given her recent admit husband was worried for heart attack. Upon arrival was lethargic and bradycardic to the 30s. She was placed on transcutaneous pacing, but required high amperage to maintain capture and was intubated for comfort and airway considerations. She was taken to the cardiac cath lab where transvenous pacemaker was placed. PCCM asked to assist with vent/medical management.   Pertinent  Medical History   has a past medical history of Asthma, Complication of anesthesia, Diabetes mellitus, Diabetic macular edema ([place]), and Seasonal allergies.   Significant Hospital Events: Including procedures, antibiotic start and stop dates in addition to other pertinent events   . 4/10 admit for heart block, intubated. Transvenous pacemaker placed. Extubated in the afternoon. . 4/11 febrile, antibitoics started, weaning NE  Interim History / Subjective:  Off and on NE overnight, off since around 6:30 this morning. No wheezing, does not want to continue on pulmicort nebs, and she is not on home ICS and does not frequently use her albuterol.   Objective   Blood pressure 103/73, pulse 72, temperature 97.9 F (36.6 C), temperature source Oral, resp. rate 18, height 5\' 4"  (1.626 m), weight 100.6 kg, SpO2 95 %.        Intake/Output Summary (Last 24  hours) at 01/05/2021 1003 Last data filed at 01/05/2021 0858 Gross per 24 hour  Intake 886.8 ml  Output 950 ml  Net -63.2 ml   Filed Weights   01/03/21 0448 01/04/21 0500 01/05/21 0425  Weight: 105 kg 97.1 kg 100.6 kg   Physical Exam: General: middle aged woman laying in bed in NAD, TVP in place. HENT: [place]/AT, eyes anicteric Respiratory: breathing comfortably on RA, minimal hoarseness. CTAB. Cardiovascular: S1S2, paced rhythm. GI: obese, soft, NT, ND Extremities: bilateral pedal edema, no cyanosis Derm: skin warm and dry, no rashes Neuro: awake, alert, moving all extremities     Labs/imaging that I have personally reviewed  (right click and "Reselect all SmartList Selections" daily)   BUN 35 Creatinine 1.53 WBC 4.3 H/H 8.8/27.7 BNP 1578.7  Resolved Hospital Problem list     Assessment & Plan:   Complete heart block causing asystolic arrest: in the setting of recent inferior MI, RCA lesion uncorrected. CAD - Transvenous pacemaker placed by Dr. [place].  To remain in place until PPM placement tomorrow- has been intermittently still requiring pacing. Needs ongoing ICU monitoring and management.  -Continue aspirin, statin.  Beta-blocker on hold due to heart block and resolving shock.  Cardiogenic shock secondary to CHB; chronic HFpEF -TVPM to remain in place until PPM -Off NE the past few hours; restart if MAP <65 and recheck coox at that time. -Daily Coox, BNP [facility] and EP following -Continue holding Plavix to reduce risk of bleeding during pacemaker implantation.  Acute blood loss anemia from right groin  hematoma related to TVPM placement> stable -Continue to monitor clinically.   -daily CBC -Transfuse for hemoglobin less than 7 or hemodynamically significant bleeding  Acute hypoxemic respiratory failure> resolved -Incentive spirometry, pulmonary hygiene  AKI with hyperphosphatemia: cardiorenal vs ATN from cardiogenic shock. Prior to admission, she  reported weight gain and started on diuretics. AKI resolved, Cr back to recent baseline ~1.5. -Maintain adequate renal perfusion, goal MAP greater than 65 -Continue to monitor renal function and electrolytes -Strict I/os; starting diuretic today -Renally dose meds and avoid nephrotoxic meds.  IDDM with current hyperglycemia; on home insulin pump -Continue long-acting insulin-- no dose change today due to procedure tomorrow and need to be NPO for procedure. She will still need to get her long-acting insulin to prevent DKA.  -increasing mealtime insulin to 5 units TIDAC -SSI PRN -goal BG 140-180  Fever- unclear etiology. Concern for possible left pneumonia. -Continue empiric antibiotics> deescalating to ceftriaxone to complete 5 days unless cultures dictate otherwise. -follow blood cultures until finalized -sputum culture inadequate; urine culture insignificant growth  Hyponatremia likely hypervolemic hyponatremia from heart failure -monitor -diuresis  Patient updated during rounds. Husband not at bedside this morning.  Best practice (right click and "Reselect all SmartList Selections" daily)  Diet:  Oral Pain/Anxiety/Delirium protocol (if indicated): No VAP protocol (if indicated): Not indicated DVT prophylaxis: Systemic AC GI prophylaxis: N/A Glucose control:  SSI Yes and Basal insulin Yes Central venous access:  Yes, and it is still needed Arterial line:  N/A Foley:  N/A Mobility:  bed rest  PT consulted: N/A Last date of multidisciplinary goals of care discussion [ 4/12] Code Status:  full code Disposition: ICU  Labs   CBC: Recent Labs  Lab 01/02/21 0136 01/02/21 0141 01/02/21 1222 01/03/21 0527 01/04/21 0727 01/04/21 1356 01/05/21 0035  WBC 9.5   < > 26.2* 15.1* 6.7 5.4 4.3  NEUTROABS 6.4  --   --   --   --   --   --   HGB 10.6*   < > 10.3* 9.3* 9.0* 8.4* 8.8*  HCT 35.0*   < > 32.7* 28.6* 28.4* 26.3* 27.7*  MCV 100.6*   < > 94.8 92.3 93.4 93.3 93.9  PLT 178    < > 237 192 184 160 175   < > = values in this interval not displayed.    Basic Metabolic Panel: Recent Labs  Lab 12/30/20 0415 01/02/21 0136 01/02/21 0141 01/02/21 0248 01/02/21 0305 01/02/21 0406 01/03/21 0527 01/04/21 0727 01/05/21 0035  NA 134* 133*   < > 130* 129* 128* 130* 132* 131*  K 3.8 5.3*   < > 4.8 4.8 5.1 3.8 4.3 4.8  CL 100 94*   < > 97*  --  95* 98 102 102  CO2 28 15*  --   --   --  17* 20* 24 25  GLUCOSE 53* 313*   < > 329*  --  377* 209* 157* 187*  BUN 27* 55*   < > 54*  --  55* 60* 43* 35*  CREATININE 1.50* 3.34*   < > 3.10*  --  3.32* 2.73* 1.74* 1.53*  CALCIUM 8.4* 8.3*  --   --   --  7.8* 7.7* 8.0* 8.0*  MG 1.9 2.1  --   --   --  1.9 1.7  --   --   PHOS  --   --   --   --   --  6.5* 4.9*  --   --    < > =  values in this interval not displayed.   GFR: Estimated Creatinine Clearance: 47.4 mL/min (A) (by C-G formula based on SCr of 1.53 mg/dL (H)). Recent Labs  Lab 01/02/21 0406 01/02/21 0725 01/02/21 1222 01/03/21 0527 01/04/21 0727 01/04/21 1356 01/05/21 0035  WBC 21.6*  --  26.2* 15.1* 6.7 5.4 4.3  LATICACIDVEN 4.9* 5.2* 4.5* 1.8  --   --   --     Liver Function Tests: Recent Labs  Lab 01/02/21 0136  AST 45*  ALT 81*  ALKPHOS 153*  BILITOT 1.7*  PROT 5.6*  ALBUMIN 2.9*   No results for input(s): LIPASE, AMYLASE in the last 168 hours. No results for input(s): AMMONIA in the last 168 hours.    This patient is critically ill with multiple organ system failure which requires frequent high complexity decision making, assessment, support, evaluation, and titration of therapies. This was completed through the application of advanced monitoring technologies and extensive interpretation of multiple databases. During this encounter critical care time was devoted to patient care services described in this note for 31 minutes.  Julian Hy, DO 01/05/21 10:42 AM [facility]  For contact information, see Amion. If no  response to pager, please call PCCM consult pager. After hours, 7PM- 7AM, please call [place].

## 2021-01-06 ENCOUNTER — Inpatient Hospital Stay (HOSPITAL_COMMUNITY)
Admission: EM | Disposition: A | Discharge status: Home / Self Care | Payer: Self-pay | Source: Home / Self Care | Attending: Critical Care Medicine

## 2021-01-06 DIAGNOSIS — Z950 Presence of cardiac pacemaker: Secondary | ICD-10-CM

## 2021-01-06 DIAGNOSIS — R509 Fever, unspecified: Secondary | ICD-10-CM | POA: Diagnosis not present

## 2021-01-06 DIAGNOSIS — I442 Atrioventricular block, complete: Secondary | ICD-10-CM

## 2021-01-06 DIAGNOSIS — I2510 Atherosclerotic heart disease of native coronary artery without angina pectoris: Secondary | ICD-10-CM | POA: Diagnosis not present

## 2021-01-06 HISTORY — PX: PACEMAKER IMPLANT: EP1218

## 2021-01-06 LAB — IRON AND TIBC
Iron: 51 ug/dL (ref 28–170)
Saturation Ratios: 20 % (ref 10.4–31.8)
TIBC: 259 ug/dL (ref 250–450)
UIBC: 208 ug/dL

## 2021-01-06 LAB — CBC
HCT: 25 % — ABNORMAL LOW (ref 36.0–46.0)
Hemoglobin: 7.8 g/dL — ABNORMAL LOW (ref 12.0–15.0)
MCH: 29.8 pg (ref 26.0–34.0)
MCHC: 31.2 g/dL (ref 30.0–36.0)
MCV: 95.4 fL (ref 80.0–100.0)
Platelets: 151 10*3/uL (ref 150–400)
RBC: 2.62 MIL/uL — ABNORMAL LOW (ref 3.87–5.11)
RDW: 14.3 % (ref 11.5–15.5)
WBC: 3.1 10*3/uL — ABNORMAL LOW (ref 4.0–10.5)
nRBC: 0 % (ref 0.0–0.2)

## 2021-01-06 LAB — APTT: aPTT: 39 seconds — ABNORMAL HIGH (ref 24–36)

## 2021-01-06 LAB — BASIC METABOLIC PANEL
Anion gap: 7 (ref 5–15)
BUN: 36 mg/dL — ABNORMAL HIGH (ref 6–20)
CO2: 23 mmol/L (ref 22–32)
Calcium: 8.5 mg/dL — ABNORMAL LOW (ref 8.9–10.3)
Chloride: 100 mmol/L (ref 98–111)
Creatinine, Ser: 1.69 mg/dL — ABNORMAL HIGH (ref 0.44–1.00)
GFR, Estimated: 35 mL/min — ABNORMAL LOW (ref >60)
Glucose, Bld: 253 mg/dL — ABNORMAL HIGH (ref 70–99)
Potassium: 5 mmol/L (ref 3.5–5.1)
Sodium: 130 mmol/L — ABNORMAL LOW (ref 135–145)

## 2021-01-06 LAB — GLUCOSE, CAPILLARY
Glucose-Capillary: 228 mg/dL — ABNORMAL HIGH (ref 70–99)
Glucose-Capillary: 223 mg/dL — ABNORMAL HIGH (ref 70–99)
Glucose-Capillary: 173 mg/dL — ABNORMAL HIGH (ref 70–99)
Glucose-Capillary: 255 mg/dL — ABNORMAL HIGH (ref 70–99)

## 2021-01-06 LAB — PREPARE RBC (CROSSMATCH)

## 2021-01-06 LAB — HEPARIN LEVEL (UNFRACTIONATED): Heparin Unfractionated: 0.36 IU/mL (ref 0.30–0.70)

## 2021-01-06 LAB — HEMOGLOBIN AND HEMATOCRIT, BLOOD
HCT: 29 % — ABNORMAL LOW (ref 36.0–46.0)
Hemoglobin: 9.2 g/dL — ABNORMAL LOW (ref 12.0–15.0)

## 2021-01-06 LAB — ABO/RH: ABO/RH(D): O POS

## 2021-01-06 SURGERY — PACEMAKER IMPLANT

## 2021-01-06 MED ORDER — ONDANSETRON HCL 4 MG/2ML IJ SOLN: 4.0000 mg | Freq: Four times a day (QID) | INTRAMUSCULAR | Status: DC | PRN

## 2021-01-06 MED ORDER — ACETAMINOPHEN 325 MG PO TABS
325.0000 mg | ORAL_TABLET | ORAL | Status: DC | PRN
  Administered 2021-01-06 – 2021-01-07 (×2): 650 mg via ORAL
  Filled 2021-01-06 (×3): qty 2

## 2021-01-06 MED ORDER — SODIUM CHLORIDE 0.9 % IV SOLN
INTRAVENOUS | Status: AC
  Filled 2021-01-06: qty 2

## 2021-01-06 MED ORDER — FENTANYL CITRATE (PF) 100 MCG/2ML IJ SOLN
INTRAMUSCULAR | Status: AC
  Filled 2021-01-06: qty 2

## 2021-01-06 MED ORDER — FENTANYL CITRATE (PF) 100 MCG/2ML IJ SOLN
INTRAMUSCULAR | Status: DC | PRN
  Administered 2021-01-06: 25 ug via INTRAVENOUS

## 2021-01-06 MED ORDER — FUROSEMIDE 10 MG/ML IJ SOLN
INTRAMUSCULAR | Status: DC | PRN
  Administered 2021-01-06: 20 mg via INTRAMUSCULAR

## 2021-01-06 MED ORDER — HEPARIN (PORCINE) IN NACL 1000-0.9 UT/500ML-% IV SOLN
INTRAVENOUS | Status: DC | PRN
  Administered 2021-01-06: 500 mL

## 2021-01-06 MED ORDER — SODIUM CHLORIDE 0.9% IV SOLUTION: Freq: Once | INTRAVENOUS | Status: AC

## 2021-01-06 MED ORDER — ACETAMINOPHEN 325 MG PO TABS
650.0000 mg | ORAL_TABLET | Freq: Four times a day (QID) | ORAL | Status: DC | PRN
  Administered 2021-01-07 – 2021-01-09 (×3): 650 mg via ORAL
  Filled 2021-01-06 (×2): qty 2

## 2021-01-06 MED ORDER — MIDAZOLAM HCL 5 MG/5ML IJ SOLN
INTRAMUSCULAR | Status: DC | PRN
  Administered 2021-01-06: 1 mg via INTRAVENOUS

## 2021-01-06 MED ORDER — MIDAZOLAM HCL 5 MG/5ML IJ SOLN
INTRAMUSCULAR | Status: AC
  Filled 2021-01-06: qty 5

## 2021-01-06 MED ORDER — LORATADINE 10 MG PO TABS
10.0000 mg | ORAL_TABLET | Freq: Every day | ORAL | Status: DC
  Administered 2021-01-06 – 2021-01-10 (×5): 10 mg via ORAL
  Filled 2021-01-06 (×5): qty 1

## 2021-01-06 MED ORDER — LIDOCAINE HCL (PF) 1 % IJ SOLN
INTRAMUSCULAR | Status: AC
  Filled 2021-01-06: qty 60

## 2021-01-06 MED ORDER — FUROSEMIDE 10 MG/ML IJ SOLN
20.0000 mg | Freq: Once | INTRAMUSCULAR | Status: DC
  Filled 2021-01-06: qty 2

## 2021-01-06 MED ORDER — HEPARIN (PORCINE) IN NACL 1000-0.9 UT/500ML-% IV SOLN
INTRAVENOUS | Status: AC
  Filled 2021-01-06: qty 500

## 2021-01-06 MED ORDER — INSULIN DETEMIR 100 UNIT/ML ~~LOC~~ SOLN
9.0000 [IU] | Freq: Two times a day (BID) | SUBCUTANEOUS | Status: DC
  Administered 2021-01-06 – 2021-01-10 (×8): 9 [IU] via SUBCUTANEOUS
  Filled 2021-01-06 (×10): qty 0.09

## 2021-01-06 MED ORDER — CEFAZOLIN SODIUM-DEXTROSE 2-4 GM/100ML-% IV SOLN
INTRAVENOUS | Status: AC
  Filled 2021-01-06: qty 100

## 2021-01-06 SURGICAL SUPPLY — 10 items
CABLE SURGICAL S-101-97-12 (CABLE) ×4
LEAD ISOFLEX OPT 1944-46CM (Lead) ×2 IMPLANT
LEAD TENDRIL MRI 46CM LPA1200M (Lead) ×4 IMPLANT
LEAD TENDRIL MRI 52CM LPA1200M (Lead) ×2 IMPLANT
MAT PREVALON FULL STRYKER (MISCELLANEOUS) ×2
PACEMAKER ASSURITY DR-RF (Pacemaker) ×2 IMPLANT
PAD PRO RADIOLUCENT 2001M-C (PAD) ×2
SHEATH 8FR PRELUDE SNAP 13 (SHEATH) ×4
SHEATH PROBE COVER 6X72 (BAG) ×2
TRAY PACEMAKER INSERTION (PACKS) ×2

## 2021-01-06 NOTE — Progress Notes (Addendum)
I have seen, examined the patient, and reviewed the above assessment and plan.    Patient doing well. Afebrile. Leukocytosis resolved. Cultures negative. Plan for PPM today. Discussed procedure in detail today with the patient and her husband including the risks and recovery. They wish to proceed. Will plan for this afternoon.  Risks, benefits, alternatives to PPM implantation were discussed in detail with the patient today. The patient understands that the risks include but are not limited to bleeding, infection, pneumothorax, perforation, tamponade, vascular damage, renal failure, MI, stroke, death, and lead dislodgement and wishes to proceed.    Vickie Epley, MD 01/06/2021 9:55 AM   --------------------------------------------------------------------------------------     Progress Note  Patient Name: Colleen [name] Date of Encounter: 01/06/2021  [facility] Cardiologist: Dr. Virgina Jock  Subjective   No CP, no nausea, + cough, no overt SOB  Inpatient Medications    Scheduled Meds: . sodium chloride   Intravenous Once  . aspirin  81 mg Oral Daily  . chlorhexidine  60 mL Topical Once  . chlorhexidine  60 mL Topical Once  . Chlorhexidine Gluconate Cloth  6 each Topical Q0600  . cholecalciferol  2,000 Units Oral Daily  . docusate sodium  100 mg Oral BID  . furosemide  20 mg Intravenous Once  . gentamicin irrigation  80 mg Irrigation On Call  . insulin aspart  0-5 Units Subcutaneous QHS  . insulin aspart  0-9 Units Subcutaneous TID WC  . insulin aspart  5 Units Subcutaneous TID WC  . insulin detemir  8 Units Subcutaneous BID  . loratadine  10 mg Oral Daily  . polyethylene glycol  17 g Oral Daily  . sodium chloride flush  3 mL Intravenous Q12H   Continuous Infusions: . sodium chloride Stopped (01/02/21 0310)  . sodium chloride    . sodium chloride    . sodium chloride 50 mL/hr at 01/06/21 0800  . sodium chloride 10 mL/hr at 01/06/21 0800  .  ceFAZolin (ANCEF)  IV    . cefTRIAXone (ROCEPHIN)  IV Stopped (01/05/21 1225)  . norepinephrine (LEVOPHED) Adult infusion Stopped (01/05/21 0626)  . promethazine (PHENERGAN) injection (IM or IVPB)     PRN Meds: acetaminophen, albuterol, docusate sodium, fentaNYL, guaiFENesin-dextromethorphan, ondansetron (ZOFRAN) IV, polyethylene glycol, sodium chloride flush   Vital Signs    Vitals:   01/06/21 0500 01/06/21 0600 01/06/21 0700 01/06/21 0800  BP: 116/60 117/66 115/64 130/69  Pulse: 73 71 75 80  Resp: 15 13 15 16   Temp:    98.2 F (36.8 C)  TempSrc:    Oral  SpO2: 96% 97% 97% 100%  Weight:      Height:        Intake/Output Summary (Last 24 hours) at 01/06/2021 0854 Last data filed at 01/06/2021 0800 Gross per 24 hour  Intake 1245.7 ml  Output 1600 ml  Net -354.3 ml   Last 3 Weights 01/06/2021 01/05/2021 01/04/2021  Weight (lbs) 225 lb 12 oz 221 lb 12.5 oz 214 lb 1.1 oz  Weight (kg) 102.4 kg 100.6 kg 97.1 kg      Telemetry    SR 80's, more noted 2:1 AVblock with rates to 40's, occ Vpacing- Personally Reviewed  ECG    No new EKGs - Personally Reviewed  Physical Exam   unchanged GEN: No acute distress.   Neck: No JVD Cardiac: RRR, no murmurs, rubs, or gallops.  Respiratory:  CTA b/l. GI: Soft, nontender to deep palpations, non-distended  MS: trace edema; No deformity. R  groin with sheath/temp wire in place is soft, nontender, no bleeding or hematoma Neuro:  Nonfocal  Psych: Normal affect   Labs    High Sensitivity Troponin:   Recent Labs  Lab 12/19/20 1154 01/02/21 0136 01/02/21 0406 01/02/21 0921 01/02/21 1222  TROPONINIHS >27,000* 462* 505* 887* 5,036*      Chemistry Recent Labs  Lab 01/02/21 0136 01/02/21 0141 01/04/21 0727 01/05/21 0035 01/06/21 0739  NA 133*   < > 132* 131* 130*  K 5.3*   < > 4.3 4.8 5.0  CL 94*   < > 102 102 100  CO2 15*   < > 24 25 23   GLUCOSE 313*   < > 157* 187* 253*  BUN 55*   < > 43* 35* 36*  CREATININE 3.34*   < > 1.74* 1.53* 1.69*   CALCIUM 8.3*   < > 8.0* 8.0* 8.5*  PROT 5.6*  --   --   --   --   ALBUMIN 2.9*  --   --   --   --   AST 45*  --   --   --   --   ALT 81*  --   --   --   --   ALKPHOS 153*  --   --   --   --   BILITOT 1.7*  --   --   --   --   GFRNONAA 16*   < > 34* 40* 35*  ANIONGAP 24*   < > 6 4* 7   < > = values in this interval not displayed.     Hematology Recent Labs  Lab 01/04/21 1356 01/05/21 0035 01/06/21 0115  WBC 5.4 4.3 3.1*  RBC 2.82* 2.95* 2.62*  HGB 8.4* 8.8* 7.8*  HCT 26.3* 27.7* 25.0*  MCV 93.3 93.9 95.4  MCH 29.8 29.8 29.8  MCHC 31.9 31.8 31.2  RDW 14.4 14.3 14.3  PLT 160 175 151    BNP Recent Labs  Lab 01/02/21 0406 01/03/21 0815 01/05/21 0035  BNP 275.4* 1,557.4* 1,578.7*     DDimer No results for input(s): DDIMER in the last 168 hours.   Radiology    CARDIAC CATHETERIZATION Result Date: 01/02/2021 Successful insertion of temporary transvenous Pacemaker. Plan: Per CCM. Primary cardiology to reconsult EP service for consideration of permanent pacemaker.   DG CHEST PORT 1 VIEW Result Date: 01/03/2021 CLINICAL DATA:  Fever EXAM: PORTABLE CHEST 1 VIEW COMPARISON:  01/02/2021 FINDINGS: Interval extubation. Pulmonary insufflation has been preserved. Lungs are clear. No pneumothorax or pleural effusion. Inferiorly approaching intravenous pacemaker lead is unchanged overlying the expected right ventricle toward the apex. Cardiac size within normal limits. Pulmonary vascularity is normal. No acute bone abnormality. IMPRESSION: Interval extubation with preservation of pulmonary insufflation. Lungs are clear. Electronically Signed   By: Fidela Salisbury MD   On: 01/03/2021 05:41      Cardiac Studies    12/24/2020: LHC  LV end diastolic pressure is normal.   LM: Normal LAD: Prox mod calcific 40% stenosis (Unchanges compared to cath 2018) LCx: Mid 100% occlusion, likely chronic. Reconstitution through retrograde filling from OM2 (New since cath 2018) RCA: Ostial 100%  occlusion, likely the culprit for her STEMI with late presentation         Retrograde filling from LAD up to distal RCA (New since cath 2018)  RAL 12 mmHg LVEDP: 10 mmHg  I attempted wiring LCx to see if this lesion was CTO  Will discuss ischemia guided revascularization in  future for LCx +/- LAD.   12/19/20; TTE IMPRESSIONS  1. Left ventricular ejection fraction, by estimation, is 55 to 60%. The  left ventricle has normal function. The left ventricle demonstrates  regional wall motion abnormalities (see scoring diagram/findings for  description). Left ventricular diastolic  parameters are indeterminate. There is mild hypokinesis of the left  ventricular, basal inferolateral wall.  2. Right ventricular systolic function is moderately reduced. The right  ventricular size is moderately enlarged. Estimated PASP 33 mmHg.  3. Right atrial size was mildly dilated.  4. Tricuspid valve regurgitation is severe.  5. The inferior vena cava is normal in size with <50% respiratory  variability, suggesting right atrial pressure of 8 mmHg.  6. Comapared to previous study report in 2018, LV/RV wall motion  abnormalities are new.   Patient Profile     55 y.o. female w/PMHx of Type I DM admitted to [facility] about 3 days of N/V, had a syncopal event the day of her admission, she was found w/inferior STEMI and CHB, AKI, had emergent temp pacing wire placed, cath delayed 2/2 AKI  During the course of her hospital stay her conduction system did imporve and maintained 1:1 conduction last couple days of her hospitalization and not felt to require PPM implant She also did eventually get cath noting CTO Cx, ostial RCA 100% this felt to be her culprit and a 40% proc LAD lesion LVEF was preserved though had RV failure associated with her MI No intervention was done Planned for out patient stress MRI to assess ischemic territory, viability Discharged 12/30/20 ] She had some pAFib during her hospital stay  and d/c on Eliquis (at least short term) with ASA, Plavix.  Readmitted yesterday with recurrent vomiting/lethargy at home, reports state EMS found her at some point bradycardic and required external pacing She was brought emergently to cath lab for temp wire She was also intubated for airway protection given sedation and comfort with external pacing  Subsequently extubated  Remains on levophed this AM  K+ 5.1 > 4.8 >> 5.1 >> 3.8 BUN/Creat 55/3.34 >> 60/2.73 Mag 2.1 AST 45 ALT 81  HS Trop 462 > 505 >  887 >> 5036  WBC 9.5 > 21.6 > 26.2 >> 15.2 H/H 10/35 >>> 9.3/28.6 Plts 178 >> 192  Her husband reports that Thursday/Friday after discharge last week she did minimal, feeling weak and tired but Saturday seemed to perk up and was doing well. Saturday had 2 "back to back" large, normal BMs She was in the recliner and mentioned that she felt weak and next time she got up wanted to go to the bed. She had 2-3 episodes of diarrhea, and he helped her to bed, She felt weak and her took her BP/HR, noting low BPsbut HR 60's-70's They reached out to her MD and were told to hold the entresto Starte to feel poorly, waek, and her BP again low SBP 60's and HR more erratic 30's-70's She then developed nausea and had an episode of vomiting and that is when he called 911    Assessment & Plan    1. Recurrent Heart block, shock     EMS record reviewed     Had SR, Mobitz I > Mobitz II  As above with recent inferior STEMI had regained posy MI condiuction She again has a temp wire in place SR mostly with 1:1 some 2:1 note as well  off levo 24hours, stable BP  2. Leukocytosis     low grade  Remains resolved     BC x2, neg 2 days     WBC wnl last 3 days     Sputum sample not usable     Urine Cx neg     ? Reactive  No symptoms of illness Was started on empiric antibiotics for fever earlier in her admission  3. CAD      As above recent inf STEMI      Primary symptoms N/V with her priro MI       Initially this presentation was N/V lethargy it seems and initially was found in SR      HS trop trended trending upwards      Some nausea last night      On ASA and hep gtt, off plavix (for pacer)  As noted and discussed with Dr. Virgina Jock, no plans for ischemic w/u or attempts at CTO at this time. Heparin gtt off last night at [place]  4. PAfib     None here     Eliquis on hold     4. AKI, CKD     Creat generally better, up a bit this AM 1.69 from yesterday     No contrast for ppm today planned     Perhaps 2/2 lasix yesterday and anemia     Labs in AM      5. Anemia     Trending downwards     7.8 this AM     small BM yesterday no melena or blood, no belly pain     Groin/sheath/temp pacer site looks good     occ low back pain is chronic for years for her and massage helps     She mentions historically after a foot surgery she had an unexplained anemia that required PRBC      We discussed plans for 1U PRBC today, with her CAD and upcoming PPM implant procedure,        D/w CCM, with some edema and elevated BNP, will give 20mg  lasix afterwards       Dr. Quentin Ore has seen and examined the patient this AM [facility] x2 for 2 days negative Afebrile Planned for PPM today      For questions or updates, please contact [facility] Please consult [email or web address] for contact info under        Signed, Baldwin Jamaica, PA-C  01/06/2021, 8:54 AM

## 2021-01-06 NOTE — Progress Notes (Signed)
Subjective:  Feels well No chest pain  1:1 AV conduction on telemetry Few episodes of sinus arrhtymia Pacemaker in place Rt groin  Objective:  Vital Signs in the last 24 hours: Temp:  [97.6 F (36.4 C)-98.3 F (36.8 C)] 98.3 F (36.8 C) (04/14 1108) Pulse Rate:  [44-88] 77 (04/14 1045) Resp:  [0-21] 13 (04/14 1045) BP: (85-130)/(49-86) 127/69 (04/14 1045) SpO2:  [92 %-100 %] 100 % (04/14 1246) Weight:  [102.4 kg] 102.4 kg (04/14 0347)  Intake/Output from previous day: 04/13 0701 - 04/14 0700 In: 1244.1 [P.O.:840; I.V.:204.1; IV Piggyback:200] Out: 1600 [Urine:1600]  Physical Exam Vitals and nursing note reviewed.  Constitutional:      General: She is not in acute distress.    Appearance: She is well-developed.  HENT:     Head: Normocephalic and atraumatic.  Eyes:     Conjunctiva/sclera: Conjunctivae normal.     Pupils: Pupils are equal, round, and reactive to light.  Neck:     Vascular: No JVD.  Cardiovascular:     Rate and Rhythm: Normal rate and regular rhythm.     Pulses: Normal pulses and intact distal pulses.     Heart sounds: No murmur heard.     Comments: Pacemaker in place Rt groin Pulmonary:     Effort: Pulmonary effort is normal.     Breath sounds: Normal breath sounds. No wheezing or rales.  Abdominal:     General: Bowel sounds are normal.     Palpations: Abdomen is soft.     Tenderness: There is no rebound.  Musculoskeletal:        General: No tenderness. Normal range of motion.     Right lower leg: No edema.     Left lower leg: No edema.  Lymphadenopathy:     Cervical: No cervical adenopathy.  Skin:    General: Skin is warm and dry.  Neurological:     Mental Status: She is alert and oriented to person, place, and time.     Cranial Nerves: No cranial nerve deficit.      Lab Results: BMP Recent Labs    01/04/21 0727 01/05/21 0035 01/06/21 0739  NA 132* 131* 130*  K 4.3 4.8 5.0  CL 102 102 100  CO2 24 25 23   GLUCOSE 157* 187* 253*   BUN 43* 35* 36*  CREATININE 1.74* 1.53* 1.69*  CALCIUM 8.0* 8.0* 8.5*  GFRNONAA 34* 40* 35*    CBC Recent Labs  Lab 01/02/21 0136 01/02/21 0141 01/06/21 0115  WBC 9.5   < > 3.1*  RBC 3.48*   < > 2.62*  HGB 10.6*   < > 7.8*  HCT 35.0*   < > 25.0*  PLT 178   < > 151  MCV 100.6*   < > 95.4  MCH 30.5   < > 29.8  MCHC 30.3   < > 31.2  RDW 14.5   < > 14.3  LYMPHSABS 2.3  --   --   MONOABS 0.7  --   --   EOSABS 0.1  --   --   BASOSABS 0.0  --   --    < > = values in this interval not displayed.    HEMOGLOBIN A1C Lab Results  Component Value Date   HGBA1C 7.5 (H) 01/02/2021   MPG 168.55 01/02/2021    Cardiac Panel (last 3 results) No results for input(s): CKTOTAL, CKMB, TROPONINI, RELINDX in the last 8760 hours.  BNP (last 3 results) Recent Labs  01/02/21 0406 01/03/21 0815 01/05/21 0035  BNP 275.4* 1,557.4* 1,578.7*    TSH Recent Labs    12/21/20 1012  TSH 1.862    Lipid Panel     Component Value Date/Time   CHOL 118 01/02/2021 0136   CHOL 164 10/08/2019 0901   TRIG 80 01/03/2021 0527   HDL 42 01/02/2021 0136   HDL 99 10/08/2019 0901   CHOLHDL 2.8 01/02/2021 0136   VLDL 42 (H) 01/02/2021 0136   LDLCALC 34 01/02/2021 0136   LDLCALC 54 10/08/2019 0901   LDLDIRECT 61 10/08/2019 0901     Hepatic Function Panel Recent Labs    12/20/20 0415 12/21/20 0328 12/23/20 0248 12/27/20 0941 01/02/21 0136  PROT 5.6*   < > 5.1* 5.7* 5.6*  ALBUMIN 3.4*   < > 2.8* 2.8* 2.9*  AST 4,893*   < > 628* 61* 45*  ALT 2,931*   < > 1,379* 343* 81*  ALKPHOS 114   < > 104 152* 153*  BILITOT 2.0*   < > 2.2* 1.9* 1.7*  BILIDIR 0.5*  --   --  0.5*  --   IBILI 1.5*  --   --  1.4*  --    < > = values in this interval not displayed.    Cardiac Studies:  EKG 01/02/2021: Sinus rhythm 97 bpm Inferior infarct, age indeterminate  Echocardiogram 01/02/2021: 1. LIMITED ECHOCARDIOGRAM; Full Study dated 12/19/2020.  2. Left ventricular ejection fraction, by estimation,  is 50 to 55%. The  left ventricle has low normal function. Diastolic function not assessed  due to limited study.  3. Right ventricular systolic function is mildly reduced. The right  ventricular size is mildly enlarged. There is mildly elevated pulmonary  artery systolic pressure. The estimated right ventricular systolic  pressure is 03.4 mmHg.  4. Left atrial size was grossly normal in size.  5. The pericardial effusion is posterior to the left ventricle.  6. The mitral valve is grossly normal. No evidence of mitral valve  regurgitation. No evidence of mitral stenosis.  7. The aortic valve is grossly normal.  8. The inferior vena cava is dilated in size with >50% respiratory  variability, suggesting right atrial pressure of 8 mmHg.   Coronary angiography 12/24/2020: LM: Normal LAD: Prox mod calcific 40% stenosis (Unchanges compared to cath 2018) LCx: Mid 100% occlusion, likely chronic. Reconstitution through retrograde filling from OM2 (New since cath 2018) RCA: Ostial 100% occlusion, likely the culprit for her STEMI with late presentation Retrograde filling from LAD up to distal RCA (New since cath 2018)  RAL 12 mmHg LVEDP: 10 mmHg Assessment & Recommendations:  55 y.o.Caucasianfemale,pharmacist by profession,withtype 1 diabetes mellitus, inferior STEMI (11/2020) with no revascularization due to late presentation and AKI, complicated by completete AV block, resolved with 1:1 conduction on discharge, returns with asystolic cardiac arrest  Cardiac arrest: Secondary to asystole, spontaneously resolved on arrival to Cath Lab, now resolved.  Intermittent complete AV block: Present on admission/. PPM today  Cardiogenic shock: Secondary to complete AV block and asystole, now resolved  Type II MI: In the setting of cardiac arrest, recent inferior infarct with residual mid circumflex CTO and moderate mid LAD disease. Can stop heparin now Continue aspirin,  statin  Coronary artery disease: Acute inferior STEMI with ostial RCA occlusion in 11/2020. Mid circumflex CTO and mid LAD 70% stenosis. My initial plan was to perform outpatient cardiac MRI with stress perfusion to evaluate for both ischemia and viability.  However, patient is now admitted with  asystole, currently has temporary pacemaker and will require permanent pacemaker.  Given her labile AV conduction, I do not think it is worth the risk to remove the temporary pacemaker for a cardiac MRI. At this time, her primary issue is AV conduction for which she will need permanent pacemaker regardless.  Also discussed with CT operator Dr. Martinique regarding possibility of left circumflex CTO PCI.  However, we both agreed that, Risks do not outweigh benefits of any potential revascularization at this time. Continue aspirin, heparin, statin. Holding P2Y12 I this hospitalization given PPM placement. Will reconsider re-initiation outpatient.  I will attempt to introduce beta-blocker after placement of permanent pacemaker.   PAF: No recurrence this hospitalization. Only a brief episode during index hospitalization (11/2020) Will discontinue eliquis. Monitor for any Afib on pacemaker   Anemia: Most likely anemia of chronic disease. 1 U PRBC today as per EP. Will check iron studies  AKI: Cr 3.3-->2.7-->1.7-->1.5-->1.6 Initially prerenal, no likely due to cardiorenal syndrome in the setting of right heart failure.  Continue IV lasix 40 mg daily, with additional dose of 20 mg after blood transfusion (4/14)  Type I DM: Patient has reportedly had clotting of her insulin pump ever since being on antiplatelet therapy and Eliquis.  I suspect this may be due to easy bleeding leading to ex vivo clotting in the insulin pump tubing.  May need to discuss this with endocrinologist outpatient. As per diabetes coordinator recommendaitons, Continue Levemir 9 U bid, Novolog 0-9units TID with meals, Novolog 0-5 units  QHS, Novolog 5 units TID with meals  CRITICAL CARE Performed by: Vernell Leep   Total critical care time: 30 minutes   Critical care time was exclusive of separately billable procedures and treating other patients.   Critical care was necessary to treat or prevent imminent or life-threatening deterioration.   Critical care was time spent personally by me on the following activities: development of treatment plan with patient and/or surrogate as well as nursing, discussions with consultants, evaluation of patient's response to treatment, examination of patient, obtaining history from patient or surrogate, ordering and performing treatments and interventions, ordering and review of laboratory studies, ordering and review of radiographic studies, pulse oximetry and re-evaluation of patient's condition.        Nigel Mormon, MD Pager: [phone] Office: [phone]

## 2021-01-06 NOTE — Progress Notes (Signed)
Inpatient Diabetes Program Recommendations  AACE/ADA: New Consensus Statement on Inpatient Glycemic Control   Target Ranges:  Prepandial:   less than 140 mg/dL      Peak postprandial:   less than 180 mg/dL (1-2 hours)      Critically ill patients:  140 - 180 mg/dL   Results for KANG, ISHIDA (MRN [id number]) as of 01/06/2021 07:38  Ref. Range 01/05/2021 08:17 01/05/2021 11:21 01/05/2021 16:12 01/05/2021 21:46 01/06/2021 06:36  Glucose-Capillary Latest Ref Range: 70 - 99 mg/dL 118 (H) 142 (H) 240 (H) 234 (H) 228 (H)   Review of Glycemic Control  Diabetes history:DM1 (makes NO insulin; requires basal, correction, and carb coverage insulin) Outpatient Diabetes medications:Medtronic insulin pump with Humalog Current orders for Inpatient glycemic control:Levemir 8 units BID, Novolog 0-9 units TID with meals, Novolog 0-5 units QHS, Novolog 5 units TID with meals  Inpatient Diabetes Program Recommendations:  Insulin: Please consider increasing Levemir to 9 units BID.   Thanks, Barnie Alderman, RN, MSN, CDE Diabetes Coordinator Inpatient Diabetes Program [phone] (Team Pager from 8am to 5pm)

## 2021-01-06 NOTE — Progress Notes (Signed)
NAME:  Colleen [name], MRN:  [id number], DOB:  13-Jul-1966, LOS: 4 ADMISSION DATE:  01/02/2021, CONSULTATION DATE:  4/10 REFERRING MD:  Dr. Eston Mould EDP , CHIEF COMPLAINT:  Heart block  History of Present Illness:  55 year old female with PMH as below, which is significant for DM1 on pump. She was recently admitted 3/27 > 4/7 for late presentation STEMI and was in AV block. She had transvenous pacemaker placed and was given time. AV block did improve and she was taken for [facility]. Multiple areas on occlusion. No PCI done. Plans for stress MRI as an outpatient. She presented to [facility] ED 4/10 early AM. She was having vomiting and lethargy at home and given her recent admit husband was worried for heart attack. Upon arrival was lethargic and bradycardic to the 30s. She was placed on transcutaneous pacing, but required high amperage to maintain capture and was intubated for comfort and airway considerations. She was taken to the cardiac cath lab where transvenous pacemaker was placed. PCCM asked to assist with vent/medical management.   Pertinent  Medical History   has a past medical history of Asthma, Complication of anesthesia, Diabetes mellitus, Diabetic macular edema ([place]), and Seasonal allergies.   Significant Hospital Events: Including procedures, antibiotic start and stop dates in addition to other pertinent events   . 4/10 admit for heart block, intubated. Transvenous pacemaker placed. Extubated in the afternoon. . 4/11 febrile, antibitoics started, weaning NE . 4/14 PPM placement; transferred out of ICU & to [facility] Cardiology's service  Interim History / Subjective:  Remains off vasopressors. She denies complaints other than ongoing nocturnal coughing.  Objective   Blood pressure 116/68, pulse 74, temperature 98.3 F (36.8 C), temperature source Oral, resp. rate 16, height 5\' 3"  (1.6 m), weight 102.1 kg, SpO2 100 %.        Intake/Output Summary (Last 24 hours) at 01/06/2021  1924 Last data filed at 01/06/2021 1830 Gross per 24 hour  Intake 362.7 ml  Output 1551 ml  Net -1188.3 ml   Filed Weights   01/05/21 0425 01/06/21 0347 01/06/21 1837  Weight: 100.6 kg 102.4 kg 102.1 kg   Physical Exam: General: middle aged woman laying in bed in NAD HENT: [place]/AT, eyes anicteric Respiratory: breathing comfortably on RA, CTAB. Voice still hoarse. Cardiovascular: S1S2, reg rate & rhythm GI: obese, soft, NT Extremities: pedal edema persists, TVP in R leg, hematoma mild & soft. Derm: warm, dry, no rashes Neuro: awake, alert, moving all extremities. Normal speech, answering questions appropriately.     Labs/imaging that I have personally reviewed  (right click and "Reselect all SmartList Selections" daily)   BUN 36 Creatinine 1.69 WBC 3.1 H/H 7.8/25  Resolved Hospital Problem list     Assessment & Plan:   Complete heart block causing asystolic arrest: in the setting of recent inferior MI, RCA lesion uncorrected. CAD - Transvenous pacemaker placed by Dr. [place].  To remain in place until PPM placement today. Can transfer out of ICU once PPM has been placed. -Continue aspirin, statin.  Beta-blocker on hold due to heart block and resolving shock-- will defer timing of restarting to Cardiology.  Cardiogenic shock secondary to CHB>> resolved chronic HFpEF --Can resume plavix and GDMT for HFrEF as BP tolerates per Cardiology's recommendations. Will need to monitor renal function when starting back on ACE/ARB [facility] Cardiology and EP following  Acute blood loss anemia from right groin hematoma related to TVPM placement> progressively decreasing despite negative fluid balance -Continue to monitor  clinically> has been stable.  -daily CBC -Transfuse 1 unit today and for hemoglobin less than 7 or hemodynamically significant bleeding  Acute hypoxemic respiratory failure> resolved -Incentive spirometry, pulmonary hygiene  AKI: cardiorenal vs ATN from cardiogenic  shock. Prior to admission, she reported weight gain and started on diuretics. AKI resolved, Cr back to recent baseline ~1.5. -Maintain adequate renal perfusion, goal MAP greater than 65. -con't close monitoring of renal function and electrolytes -renally dose meds, avoid nephrotoxic meds --strict I/Os; goal is euvolemia -Renally dose meds and avoid nephrotoxic meds.  IDDM with current hyperglycemia; on home insulin pump -Continue long-acting insulin-- monitor closely while NPO -increasing mealtime insulin to 5 units TIDAC> on hold today until eating again -SSI PRN -goal BG 140-180  Fever- unclear etiology , now resolved. -Continue empiric antibiotics> deescalating to ceftriaxone to complete 5 days unless cultures dictate otherwise. -follow blood cultures until finalized -sputum culture inadequate; urine culture insignificant growth  Persistent hyponatremia likely hypervolemic hyponatremia from heart failure -monitor -diuresis  Patient and husband updated during rounds this morning.    Transferring out of ICU after PPM. Transferring to [place] tomorrow- d/w Dr. Virgina Jock.  Best practice (right click and "Reselect all SmartList Selections" daily)  Diet:  Oral Pain/Anxiety/Delirium protocol (if indicated): No VAP protocol (if indicated): Not indicated DVT prophylaxis: Systemic AC GI prophylaxis: N/A Glucose control:  SSI Yes and Basal insulin Yes Central venous access:  Yes, and it is still needed Arterial line:  N/A Foley:  N/A Mobility:  bed rest  PT consulted: N/A Last date of multidisciplinary goals of care discussion [ 4/12] Code Status:  full code Disposition: can leave ICU after PPM  Labs   CBC: Recent Labs  Lab 01/02/21 0136 01/02/21 0141 01/03/21 0527 01/04/21 0727 01/04/21 1356 01/05/21 0035 01/06/21 0115 01/06/21 1531  WBC 9.5   < > 15.1* 6.7 5.4 4.3 3.1*  --   NEUTROABS 6.4  --   --   --   --   --   --   --   HGB 10.6*   < >  9.3* 9.0* 8.4* 8.8* 7.8* 9.2*  HCT 35.0*   < > 28.6* 28.4* 26.3* 27.7* 25.0* 29.0*  MCV 100.6*   < > 92.3 93.4 93.3 93.9 95.4  --   PLT 178   < > 192 184 160 175 151  --    < > = values in this interval not displayed.    Basic Metabolic Panel: Recent Labs  Lab 01/02/21 0136 01/02/21 0141 01/02/21 0406 01/03/21 0527 01/04/21 0727 01/05/21 0035 01/06/21 0739  NA 133*   < > 128* 130* 132* 131* 130*  K 5.3*   < > 5.1 3.8 4.3 4.8 5.0  CL 94*   < > 95* 98 102 102 100  CO2 15*  --  17* 20* 24 25 23   GLUCOSE 313*   < > 377* 209* 157* 187* 253*  BUN 55*   < > 55* 60* 43* 35* 36*  CREATININE 3.34*   < > 3.32* 2.73* 1.74* 1.53* 1.69*  CALCIUM 8.3*  --  7.8* 7.7* 8.0* 8.0* 8.5*  MG 2.1  --  1.9 1.7  --   --   --   PHOS  --   --  6.5* 4.9*  --   --   --    < > = values in this interval not displayed.   GFR: Estimated Creatinine Clearance: 42.4 mL/min (A) (by C-G formula based on SCr of  1.69 mg/dL (H)). Recent Labs  Lab 01/02/21 0406 01/02/21 0725 01/02/21 1222 01/03/21 0527 01/04/21 0727 01/04/21 1356 01/05/21 0035 01/06/21 0115  WBC 21.6*  --  26.2* 15.1* 6.7 5.4 4.3 3.1*  LATICACIDVEN 4.9* 5.2* 4.5* 1.8  --   --   --   --     Liver Function Tests: Recent Labs  Lab 01/02/21 0136  AST 45*  ALT 81*  ALKPHOS 153*  BILITOT 1.7*  PROT 5.6*  ALBUMIN 2.9*   No results for input(s): LIPASE, AMYLASE in the last 168 hours. No results for input(s): AMMONIA in the last 168 hours.   Julian Hy, DO 01/06/21 7:24 PM [facility]  For contact information, see Amion. If no response to pager, please call PCCM consult pager. After hours, 7PM- 7AM, please call [place].

## 2021-01-07 ENCOUNTER — Inpatient Hospital Stay (HOSPITAL_COMMUNITY): Payer: 59

## 2021-01-07 ENCOUNTER — Encounter (HOSPITAL_COMMUNITY): Payer: Self-pay | Admitting: Cardiology

## 2021-01-07 LAB — BASIC METABOLIC PANEL
Anion gap: 4 — ABNORMAL LOW (ref 5–15)
BUN: 32 mg/dL — ABNORMAL HIGH (ref 6–20)
CO2: 24 mmol/L (ref 22–32)
Calcium: 8.6 mg/dL — ABNORMAL LOW (ref 8.9–10.3)
Chloride: 99 mmol/L (ref 98–111)
Creatinine, Ser: 1.45 mg/dL — ABNORMAL HIGH (ref 0.44–1.00)
GFR, Estimated: 42 mL/min — ABNORMAL LOW (ref >60)
Glucose, Bld: 245 mg/dL — ABNORMAL HIGH (ref 70–99)
Potassium: 5.4 mmol/L — ABNORMAL HIGH (ref 3.5–5.1)
Sodium: 127 mmol/L — ABNORMAL LOW (ref 135–145)

## 2021-01-07 LAB — CBC
HCT: 28.4 % — ABNORMAL LOW (ref 36.0–46.0)
Hemoglobin: 9.1 g/dL — ABNORMAL LOW (ref 12.0–15.0)
MCH: 29.8 pg (ref 26.0–34.0)
MCHC: 32 g/dL (ref 30.0–36.0)
MCV: 93.1 fL (ref 80.0–100.0)
Platelets: 158 10*3/uL (ref 150–400)
RBC: 3.05 MIL/uL — ABNORMAL LOW (ref 3.87–5.11)
RDW: 14.8 % (ref 11.5–15.5)
WBC: 3.2 10*3/uL — ABNORMAL LOW (ref 4.0–10.5)
nRBC: 0 % (ref 0.0–0.2)

## 2021-01-07 LAB — GLUCOSE, CAPILLARY
Glucose-Capillary: 214 mg/dL — ABNORMAL HIGH (ref 70–99)
Glucose-Capillary: 270 mg/dL — ABNORMAL HIGH (ref 70–99)
Glucose-Capillary: 196 mg/dL — ABNORMAL HIGH (ref 70–99)
Glucose-Capillary: 284 mg/dL — ABNORMAL HIGH (ref 70–99)

## 2021-01-07 MED ORDER — METOPROLOL SUCCINATE ER 25 MG PO TB24: 25.0000 mg | ORAL_TABLET | Freq: Every day | ORAL | Status: DC

## 2021-01-07 MED ORDER — METOPROLOL SUCCINATE ER 25 MG PO TB24
12.5000 mg | ORAL_TABLET | Freq: Every day | ORAL | Status: DC
  Administered 2021-01-07 – 2021-01-10 (×4): 12.5 mg via ORAL
  Filled 2021-01-07 (×4): qty 1

## 2021-01-07 MED ORDER — SODIUM POLYSTYRENE SULFONATE 15 GM/60ML PO SUSP
15.0000 g | Freq: Once | ORAL | Status: AC
  Administered 2021-01-07: 15 g via ORAL
  Filled 2021-01-07: qty 60

## 2021-01-07 MED ORDER — FUROSEMIDE 10 MG/ML IJ SOLN
40.0000 mg | Freq: Every day | INTRAMUSCULAR | Status: DC
  Administered 2021-01-07 – 2021-01-09 (×3): 40 mg via INTRAVENOUS
  Filled 2021-01-07 (×3): qty 4

## 2021-01-07 MED FILL — Lidocaine HCl Local Preservative Free (PF) Inj 1%: INTRAMUSCULAR | Qty: 60 | Status: AC

## 2021-01-07 NOTE — TOC Initial Note (Addendum)
Transition of Care [facility]) - Initial/Assessment Note    Patient Details  Name: Colleen [name] MRN: [id number] Date of Birth: [birth year]  Transition of Care [facility]) CM/SW Contact:    Zenon Mayo, RN Phone Number: 01/07/2021, 8:19 AM  Clinical Narrative:                 NCM spoke with patient, she states she is s/p pacemaker .  She states she never got to go to her outpatient physical therapy with [facility]  Fax  [phone] outpatient physical therapy , but would like to get it set up again. Will need to see if MD would like for patient to continue outpatient pt at dc. TOC team will follow for dc needs. Per MD he states to hold off for now for the outpatient physical therapy . She will be here til Monday.  Expected Discharge Plan: Home/Self Care Barriers to Discharge: No Barriers Identified   Patient Goals and CMS Choice Patient states their goals for this hospitalization and ongoing recovery are:: return home   Choice offered to / list presented to : NA  Expected Discharge Plan and Services Expected Discharge Plan: Home/Self Care In-house Referral: NA Discharge Planning Services: CM Consult Post Acute Care Choice: NA Living arrangements for the past 2 months: Single Family Home                   DME Agency: NA                  Prior Living Arrangements/Services Living arrangements for the past 2 months: Single Family Home Lives with:: Spouse Patient language and need for interpreter reviewed:: Yes Do you feel safe going back to the place where you live?: Yes      Need for Family Participation in Patient Care: Yes (Comment) Care giver support system in place?: Yes (comment) Current home services: Other (comment),DME (outpatient pt, has rolling walker) Criminal Activity/Legal Involvement Pertinent to Current Situation/Hospitalization: No - Comment as needed  Activities of Daily Living Home Assistive Devices/Equipment: None ADL Screening (condition at time  of admission) Patient's cognitive ability adequate to safely complete daily activities?: Yes Is the patient deaf or have difficulty hearing?: No Does the patient have difficulty seeing, even when wearing glasses/contacts?: No Does the patient have difficulty concentrating, remembering, or making decisions?: No Patient able to express need for assistance with ADLs?: Yes Does the patient have difficulty dressing or bathing?: No Independently performs ADLs?: Yes (appropriate for developmental age) Does the patient have difficulty walking or climbing stairs?: No Weakness of Legs: None Weakness of Arms/Hands: None  Permission Sought/Granted                  Emotional Assessment Appearance:: Appears stated age Attitude/Demeanor/Rapport: Engaged Affect (typically observed): Appropriate Orientation: : Oriented to Self,Oriented to Place,Oriented to  Time,Oriented to Situation Alcohol / Substance Use: Not Applicable Psych Involvement: No (comment)  Admission diagnosis:  Complete heart block ([place]) [I44.2] Patient Active Problem List   Diagnosis Date Noted  . Elevated troponin 01/02/2021  . Endotracheally intubated 01/02/2021  . Complete heart block ([place]) 01/02/2021  . Acute respiratory failure with hypoxemia ([place])   . Shock ([place])   . AKI (acute kidney injury) ([place])   . Type 1 diabetes mellitus with hyperglycemia ([place])   . Long-term insulin use ([place])   . History of ST elevation myocardial infarction (STEMI)   . Symptomatic bradycardia   . Paroxysmal atrial flutter ([place]) 12/28/2020  .  Paroxysmal atrial fibrillation ([place]) 12/28/2020  . ST elevation myocardial infarction involving right coronary artery ([place])   . Chest pain   . Heart block AV complete ([place]) 12/19/2020  . AV block 12/19/2020  . Syncope   . Coronary artery disease 10/07/2019  . Essential hypertension 10/07/2019  . Exertional shortness of breath 05/08/2017  . Chronic venous insufficiency 01/19/2017  . Type II  diabetes mellitus ([place]) 01/17/2012  . Serum cholinesterase deficiency ([place]) 01/17/2012  . Premature menopause 01/17/2012  . GERD (gastroesophageal reflux disease)   . Asthma    PCP:  Orpah Melter, MD Pharmacy:   [id number] [place], [address] [place] [address] Phone: [phone] Fax: [phone]  [place], [address], [address] [place], [place] [address] Phone: [phone] Fax: [place] [address] [address] Phone: [phone] Fax: [phone]     Social Determinants of Health (SDOH) Interventions    Readmission Risk Interventions Readmission Risk Prevention Plan 01/07/2021 12/30/2020  Transportation Screening Complete Complete  PCP or Specialist Appt within 5-7 Days - Complete  Home Care Screening - Complete  Medication Review (RN CM) - Complete  HRI or Home Care Consult Complete -  Social Work Consult for [place] Planning/Counseling Complete -  Palliative Care Screening Not Applicable -  Medication Review Press photographer) Complete -  Some recent data might be hidden

## 2021-01-07 NOTE — Progress Notes (Signed)
Progress Note  Patient Name: Colleen [name] Date of Encounter: 01/07/2021  [facility] Cardiologist: Dr. Virgina Jock  Subjective   No CP, SOB, minimal site PPM discomfort  Inpatient Medications    Scheduled Meds: . aspirin  81 mg Oral Daily  . Chlorhexidine Gluconate Cloth  6 each Topical Q0600  . cholecalciferol  2,000 Units Oral Daily  . docusate sodium  100 mg Oral BID  . furosemide  20 mg Intravenous Once  . furosemide  40 mg Intravenous Daily  . insulin aspart  0-5 Units Subcutaneous QHS  . insulin aspart  0-9 Units Subcutaneous TID WC  . insulin aspart  5 Units Subcutaneous TID WC  . insulin detemir  9 Units Subcutaneous BID  . loratadine  10 mg Oral Daily  . metoprolol succinate  12.5 mg Oral Daily  . polyethylene glycol  17 g Oral Daily  . sodium chloride flush  3 mL Intravenous Q12H   Continuous Infusions: . sodium chloride Stopped (01/02/21 0310)  . sodium chloride    . cefTRIAXone (ROCEPHIN)  IV 2 g (01/06/21 1122)  . norepinephrine (LEVOPHED) Adult infusion Stopped (01/05/21 0626)  . promethazine (PHENERGAN) injection (IM or IVPB)     PRN Meds: acetaminophen, acetaminophen, albuterol, docusate sodium, fentaNYL, guaiFENesin-dextromethorphan, ondansetron (ZOFRAN) IV, polyethylene glycol   Vital Signs    Vitals:   01/07/21 0015 01/07/21 0425 01/07/21 0437 01/07/21 0735  BP: 116/68 138/78  135/67  Pulse: 70 78  72  Resp: 16 16  17   Temp: 98.4 F (36.9 C) 98.2 F (36.8 C)  98.3 F (36.8 C)  TempSrc: Oral Oral  Oral  SpO2: 97% 100%  100%  Weight:   101.6 kg   Height:        Intake/Output Summary (Last 24 hours) at 01/07/2021 1028 Last data filed at 01/07/2021 0906 Gross per 24 hour  Intake 1228 ml  Output 1901 ml  Net -673 ml   Last 3 Weights 01/07/2021 01/06/2021 01/06/2021  Weight (lbs) 223 lb 15.8 oz 225 lb 1.4 oz 225 lb 12 oz  Weight (kg) 101.6 kg 102.1 kg 102.4 kg      Telemetry    SR intermittent SR/VP- Personally Reviewed  ECG     SR, 1st degree Avblock - Personally Reviewed  Physical Exam    GEN: No acute distress.   Neck: No JVD Cardiac: RRR, no murmurs, rubs, or gallops.  Respiratory:  CTA b/l. GI: Soft, nontender to deep palpations, non-distended  MS: trace edema; No deformity. Neuro:  Nonfocal  Psych: Normal affect   R groin is stable, no hematoma, nontender L chest: PPM site is clean, dry, no bleeding or hematoma  Labs    High Sensitivity Troponin:   Recent Labs  Lab 12/19/20 1154 01/02/21 0136 01/02/21 0406 01/02/21 0921 01/02/21 1222  TROPONINIHS >27,000* 462* 505* 887* 5,036*      Chemistry Recent Labs  Lab 01/02/21 0136 01/02/21 0141 01/05/21 0035 01/06/21 0739 01/07/21 0334  NA 133*   < > 131* 130* 127*  K 5.3*   < > 4.8 5.0 5.4*  CL 94*   < > 102 100 99  CO2 15*   < > 25 23 24   GLUCOSE 313*   < > 187* 253* 245*  BUN 55*   < > 35* 36* 32*  CREATININE 3.34*   < > 1.53* 1.69* 1.45*  CALCIUM 8.3*   < > 8.0* 8.5* 8.6*  PROT 5.6*  --   --   --   --  ALBUMIN 2.9*  --   --   --   --   AST 45*  --   --   --   --   ALT 81*  --   --   --   --   ALKPHOS 153*  --   --   --   --   BILITOT 1.7*  --   --   --   --   GFRNONAA 16*   < > 40* 35* 42*  ANIONGAP 24*   < > 4* 7 4*   < > = values in this interval not displayed.     Hematology Recent Labs  Lab 01/05/21 0035 01/06/21 0115 01/06/21 1531 01/07/21 0334  WBC 4.3 3.1*  --  3.2*  RBC 2.95* 2.62*  --  3.05*  HGB 8.8* 7.8* 9.2* 9.1*  HCT 27.7* 25.0* 29.0* 28.4*  MCV 93.9 95.4  --  93.1  MCH 29.8 29.8  --  29.8  MCHC 31.8 31.2  --  32.0  RDW 14.3 14.3  --  14.8  PLT 175 151  --  158    BNP Recent Labs  Lab 01/02/21 0406 01/03/21 0815 01/05/21 0035  BNP 275.4* 1,557.4* 1,578.7*     DDimer No results for input(s): DDIMER in the last 168 hours.   Radiology    01/07/21 CXR IMPRESSION: Left subclavian dual lead pacemaker with leads within the right atrium and right ventricle. No pneumothorax.    Cardiac  Studies    12/24/2020: LHC  LV end diastolic pressure is normal.   LM: Normal LAD: Prox mod calcific 40% stenosis (Unchanges compared to cath 2018) LCx: Mid 100% occlusion, likely chronic. Reconstitution through retrograde filling from OM2 (New since cath 2018) RCA: Ostial 100% occlusion, likely the culprit for her STEMI with late presentation         Retrograde filling from LAD up to distal RCA (New since cath 2018)  RAL 12 mmHg LVEDP: 10 mmHg  I attempted wiring LCx to see if this lesion was CTO  Will discuss ischemia guided revascularization in future for LCx +/- LAD.   12/19/20; TTE IMPRESSIONS  1. Left ventricular ejection fraction, by estimation, is 55 to 60%. The  left ventricle has normal function. The left ventricle demonstrates  regional wall motion abnormalities (see scoring diagram/findings for  description). Left ventricular diastolic  parameters are indeterminate. There is mild hypokinesis of the left  ventricular, basal inferolateral wall.  2. Right ventricular systolic function is moderately reduced. The right  ventricular size is moderately enlarged. Estimated PASP 33 mmHg.  3. Right atrial size was mildly dilated.  4. Tricuspid valve regurgitation is severe.  5. The inferior vena cava is normal in size with <50% respiratory  variability, suggesting right atrial pressure of 8 mmHg.  6. Comapared to previous study report in 2018, LV/RV wall motion  abnormalities are new.   Patient Profile     55 y.o. female w/PMHx of Type I DM admitted to [facility] about 3 days of N/V, had a syncopal event the day of her admission, she was found w/inferior STEMI and CHB, AKI, had emergent temp pacing wire placed, cath delayed 2/2 AKI  During the course of her hospital stay her conduction system did imporve and maintained 1:1 conduction last couple days of her hospitalization and not felt to require PPM implant She also did eventually get cath noting CTO Cx, ostial RCA 100%  this felt to be her culprit and a 40% proc LAD lesion  LVEF was preserved though had RV failure associated with her MI No intervention was done Planned for out patient stress MRI to assess ischemic territory, viability Discharged 12/30/20 ] She had some pAFib during her hospital stay and d/c on Eliquis (at least short term) with ASA, Plavix.  Readmitted yesterday with recurrent vomiting/lethargy at home, reports state EMS found her at some point bradycardic and required external pacing She was brought emergently to cath lab for temp wire She was also intubated for airway protection given sedation and comfort with external pacing  Subsequently extubated  Remains on levophed this AM  K+ 5.1 > 4.8 >> 5.1 >> 3.8 BUN/Creat 55/3.34 >> 60/2.73 Mag 2.1 AST 45 ALT 81  HS Trop 462 > 505 >  887 >> 5036  WBC 9.5 > 21.6 > 26.2 >> 15.2 H/H 10/35 >>> 9.3/28.6 Plts 178 >> 192  Her husband reports that Thursday/Friday after discharge last week she did minimal, feeling weak and tired but Saturday seemed to perk up and was doing well. Saturday had 2 "back to back" large, normal BMs She was in the recliner and mentioned that she felt weak and next time she got up wanted to go to the bed. She had 2-3 episodes of diarrhea, and he helped her to bed, She felt weak and her took her BP/HR, noting low BPsbut HR 60's-70's They reached out to her MD and were told to hold the entresto Starte to feel poorly, waek, and her BP again low SBP 60's and HR more erratic 30's-70's She then developed nausea and had an episode of vomiting and that is when he called 911    Assessment & Plan    1. Recurrent Heart block, shock     EMS record reviewed     Had SR, Mobitz I > Mobitz II  As above with recent inferior STEMI had regained posy MI condiuction  S/p PPM implant yesterday with Dr. Quentin Ore Site is stable Device check this am with intact function CXR this Am without ptx Wound care and activity instructions  were reviewed with the patient and her husband Post implant follow up is in place  2. Leukocytosis     low grade  Remains resolved     BC x2, neg 4 days     WBC wnl last 4 days     Sputum sample not usable     Urine Cx neg     Likely reactive  No symptoms of illness Was started on empiric antibiotics for fever earlier in her admission  3. CAD      As above recent inf STEMI      Primary symptoms N/V with her priro MI      Initially this presentation was N/V lethargy it seems and initially was found in SR      HS trop trended trending upwards      Some nausea last night      On ASA and hep gtt, off plavix (for pacer)  As noted and discussed with Dr. Virgina Jock, no plans for ischemic w/u or attempts at CTO at this time.  PLEASE do not resume Plavix until 01/10/21 post pacer implant  4. PAfib     None here      In d/w Dr. Virgina Jock, no plans to resume Eliquis, will watch for AFib via her device for burden/recurrance    4. AKI, CKD     Creat Trending down      5. Anemia     1u  PRBC yesterday     H/H responded     C/w attending       Dr. Quentin Ore has seen and examined the patient and OK to discharge from EP perspective EP will sign off Please recall if needed    For questions or updates, please contact [place] Please consult [email or web address] for contact info under        Signed, Baldwin [place], PA-C  01/07/2021, 10:28 AM

## 2021-01-07 NOTE — Evaluation (Signed)
Occupational Therapy Evaluation Patient Details Name: Colleen [name] MRN: [id number] DOB: [birth year] Today's Date: 01/07/2021    History of Present Illness Pt is a 55 y/o female admitted 3/27 secondary to nausea/vomiting. Found to have complete heart block and STEMI and was taken to have temporary pacer placed on 3/27. Pacer leads removed on 4/4. Pt is also s/p heart cath on 4/1. Brady to 77s on 12/28/2020. PMHx of type-1 insulin-dependent DM, HTN, CAD, CKDIII, left ankle arthrodesis 2/2 Charcot joint.  S/p Left subclavian dual lead pacemaker placement 4/15.   Clinical Impression   Patient was admitted for the procedure above.  PTA she lives with her spouse who is able to assist as needed.  She was home from her last admit about a day and a half, and really had not gotten into any routine.  She plans on returning home when cleared medically, and will follow up with outpatient rehab.  Currently she is needing up to supervision for basic in room mobility at RW level, and Min A for lower body ADL due to L UE AROM restrictions.  OT will follow up in the acute setting to ensure max functional potential, and assist with family training as needed.  No post acute OT is anticipated.  Pacemaker precautions reviewed, patient verbalizing understanding.      Follow Up Recommendations  No OT follow up    Equipment Recommendations  None recommended by OT    Recommendations for Other Services       Precautions / Restrictions Precautions Precautions: Fall Precaution Comments: monitor HR Restrictions Weight Bearing Restrictions: Yes LUE Weight Bearing: Partial weight bearing LUE Partial Weight Bearing Percentage or Pounds: limit pushing and pulling, limit AROM.      Mobility Bed Mobility Overal bed mobility: Needs Assistance Bed Mobility: Supine to Sit;Sit to Supine     Supine to sit: Supervision Sit to supine: Supervision   General bed mobility comments: verbal cues to limit pushing and  pulling with LUE.  Patient able to hook legs around EOB and pull her hips forward. Patient Response: Cooperative  Transfers Overall transfer level: Needs assistance Equipment used: Rolling walker (2 wheeled) Transfers: Sit to/from [facility] Sit to Stand: Supervision Stand pivot transfers: Supervision            Balance Overall balance assessment: Needs assistance Sitting-balance support: Feet supported Sitting balance-Leahy Scale: Good     Standing balance support: Bilateral upper extremity supported Standing balance-Leahy Scale: Fair Standing balance comment: able to static stand without holding onto RW, but requests RW for EC and stability.                           ADL either performed or assessed with clinical judgement   ADL Overall ADL's : Needs assistance/impaired     Grooming: Wash/dry hands;Wash/dry face;Supervision/safety;Standing               Lower Body Dressing: Minimal assistance;Sitting/lateral leans   Toilet Transfer: Supervision/safety;RW;Ambulation;Regular Toilet   Toileting- Clothing Manipulation and Hygiene: Supervision/safety;Sit to/from stand       Functional mobility during ADLs: Supervision/safety;Rolling walker       Vision Baseline Vision/History: Wears glasses Wears Glasses: At all times Patient Visual Report: No change from baseline       Perception     Praxis      Pertinent Vitals/Pain Pain Assessment: 0-10 Pain Score: 2  Pain Location: L shoulder complex Pain Descriptors / Indicators: Tender Pain Intervention(s):  Monitored during session     Hand Dominance Right   Extremity/Trunk Assessment Upper Extremity Assessment Upper Extremity Assessment: LUE deficits/detail LUE Deficits / Details: pacemaker restrictions LUE Sensation: WNL LUE Coordination: WNL   Lower Extremity Assessment Lower Extremity Assessment: Defer to PT evaluation   Cervical / Trunk Assessment Cervical / Trunk  Assessment: Normal   Communication Communication Communication: No difficulties   Cognition Arousal/Alertness: Awake/alert Behavior During Therapy: WFL for tasks assessed/performed Overall Cognitive Status: Within Functional Limits for tasks assessed                                                      Home Living Family/patient expects to be discharged to:: Private residence Living Arrangements: Spouse/significant other Available Help at Discharge: Family Type of Home: House Home Access: Level entry     Home Layout: Other (Comment)     Bathroom Shower/Tub: Occupational psychologist: Handicapped height     Home Equipment: Shower seat - built in;Grab bars - tub/shower;Cane - single point          Prior Functioning/Environment Level of Independence: Independent with assistive device(s)        Comments: Occasionally uses cane for mobility for balance. Pt reports recently retired and on disability due to Charcot and current illnesses.        OT Problem List: Decreased activity tolerance;Impaired balance (sitting and/or standing);Increased edema      OT Treatment/Interventions: Self-care/ADL training;Energy conservation;Balance training;Therapeutic activities    OT Goals(Current goals can be found in the care plan section) Acute Rehab OT Goals Patient Stated Goal: To recover and feel better. OT Goal Formulation: With patient Time For Goal Achievement: 01/21/21 Potential to Achieve Goals: Good ADL Goals Pt Will Perform Grooming: with modified independence;sitting;standing Pt Will Perform Lower Body Bathing: with modified independence;sit to/from stand Pt Will Perform Lower Body Dressing: with modified independence;sit to/from stand Pt Will Transfer to Toilet: with modified independence;ambulating;regular height toilet  OT Frequency: Min 1X/week   Barriers to D/C:    none noted       Co-evaluation              AM-PAC OT "6  Clicks" Daily Activity     Outcome Measure Help from another person eating meals?: None Help from another person taking care of personal grooming?: None Help from another person toileting, which includes using toliet, bedpan, or urinal?: None Help from another person bathing (including washing, rinsing, drying)?: A Little Help from another person to put on and taking off regular upper body clothing?: A Little Help from another person to put on and taking off regular lower body clothing?: A Little 6 Click Score: 21   End of Session Equipment Utilized During Treatment: Gait belt;Rolling walker Nurse Communication: Mobility status  Activity Tolerance: Patient tolerated treatment well Patient left: in bed;with call bell/phone within reach;with nursing/sitter in room  OT Visit Diagnosis: Other abnormalities of gait and mobility (R26.89);Pain Pain - Right/Left: Left Pain - part of body: Shoulder                Time: 7672-0947 OT Time Calculation (min): 19 min Charges:  OT General Charges $OT Visit: 1 Visit OT Evaluation $OT Eval Moderate Complexity: 1 Mod  01/07/2021  Rich, OTR/L  Acute Rehabilitation Services  Office:  [phone]   Janice Coffin  Kishaun Erekson 01/07/2021, 3:42 PM

## 2021-01-07 NOTE — Progress Notes (Addendum)
Subjective:  Feels well No chest pain  S/p pacemaker placement 4/14  Objective:  Vital Signs in the last 24 hours: Temp:  [98.1 F (36.7 C)-99.7 F (37.6 C)] 98.3 F (36.8 C) (04/15 0735) Pulse Rate:  [0-143] 72 (04/15 0735) Resp:  [0-86] 17 (04/15 0735) BP: (94-149)/(58-85) 135/67 (04/15 0735) SpO2:  [0 %-100 %] 100 % (04/15 0735) Weight:  [101.6 kg-102.1 kg] 101.6 kg (04/15 0437)  Intake/Output from previous day: 04/14 0701 - 04/15 0700 In: 1001.6 [P.O.:720; I.V.:16.6; Blood:265] Out: 0938 [Urine:1900; Stool:1]  Physical Exam Vitals and nursing note reviewed.  Constitutional:      General: She is not in acute distress.    Appearance: She is well-developed.  HENT:     Head: Normocephalic and atraumatic.  Eyes:     Conjunctiva/sclera: Conjunctivae normal.     Pupils: Pupils are equal, round, and reactive to light.  Neck:     Vascular: No JVD.  Cardiovascular:     Rate and Rhythm: Normal rate and regular rhythm.     Pulses: Normal pulses and intact distal pulses.     Heart sounds: No murmur heard.     Comments: Pacemaker in place Rt groin Pulmonary:     Effort: Pulmonary effort is normal.     Breath sounds: Normal breath sounds. No wheezing or rales.  Chest:     Comments: S/p pacemaker left upper chest Dressing in place No hematoma Abdominal:     General: Bowel sounds are normal.     Palpations: Abdomen is soft.     Tenderness: There is no rebound.  Musculoskeletal:        General: No tenderness. Normal range of motion.     Right lower leg: Edema (1+) present.     Left lower leg: Edema (1+) present.  Lymphadenopathy:     Cervical: No cervical adenopathy.  Skin:    General: Skin is warm and dry.  Neurological:     Mental Status: She is alert and oriented to person, place, and time.     Cranial Nerves: No cranial nerve deficit.      Lab Results: BMP Recent Labs    01/05/21 0035 01/06/21 0739 01/07/21 0334  NA 131* 130* 127*  K 4.8 5.0 5.4*  CL  102 100 99  CO2 25 23 24   GLUCOSE 187* 253* 245*  BUN 35* 36* 32*  CREATININE 1.53* 1.69* 1.45*  CALCIUM 8.0* 8.5* 8.6*  GFRNONAA 40* 35* 42*    CBC Recent Labs  Lab 01/02/21 0136 01/02/21 0141 01/07/21 0334  WBC 9.5   < > 3.2*  RBC 3.48*   < > 3.05*  HGB 10.6*   < > 9.1*  HCT 35.0*   < > 28.4*  PLT 178   < > 158  MCV 100.6*   < > 93.1  MCH 30.5   < > 29.8  MCHC 30.3   < > 32.0  RDW 14.5   < > 14.8  LYMPHSABS 2.3  --   --   MONOABS 0.7  --   --   EOSABS 0.1  --   --   BASOSABS 0.0  --   --    < > = values in this interval not displayed.    HEMOGLOBIN A1C Lab Results  Component Value Date   HGBA1C 7.5 (H) 01/02/2021   MPG 168.55 01/02/2021    Cardiac Panel (last 3 results) No results for input(s): CKTOTAL, CKMB, TROPONINI, RELINDX in the last 8760 hours.  BNP (last 3 results) Recent Labs    01/02/21 0406 01/03/21 0815 01/05/21 0035  BNP 275.4* 1,557.4* 1,578.7*    TSH Recent Labs    12/21/20 1012  TSH 1.862    Lipid Panel     Component Value Date/Time   CHOL 118 01/02/2021 0136   CHOL 164 10/08/2019 0901   TRIG 80 01/03/2021 0527   HDL 42 01/02/2021 0136   HDL 99 10/08/2019 0901   CHOLHDL 2.8 01/02/2021 0136   VLDL 42 (H) 01/02/2021 0136   LDLCALC 34 01/02/2021 0136   LDLCALC 54 10/08/2019 0901   LDLDIRECT 61 10/08/2019 0901     Hepatic Function Panel Recent Labs    12/20/20 0415 12/21/20 0328 12/23/20 0248 12/27/20 0941 01/02/21 0136  PROT 5.6*   < > 5.1* 5.7* 5.6*  ALBUMIN 3.4*   < > 2.8* 2.8* 2.9*  AST 4,893*   < > 628* 61* 45*  ALT 2,931*   < > 1,379* 343* 81*  ALKPHOS 114   < > 104 152* 153*  BILITOT 2.0*   < > 2.2* 1.9* 1.7*  BILIDIR 0.5*  --   --  0.5*  --   IBILI 1.5*  --   --  1.4*  --    < > = values in this interval not displayed.    Cardiac Studies:  EKG 01/02/2021: Sinus rhythm 97 bpm Inferior infarct, age indeterminate  Echocardiogram 01/02/2021: 1. LIMITED ECHOCARDIOGRAM; Full Study dated 12/19/2020.  2.  Left ventricular ejection fraction, by estimation, is 50 to 55%. The  left ventricle has low normal function. Diastolic function not assessed  due to limited study.  3. Right ventricular systolic function is mildly reduced. The right  ventricular size is mildly enlarged. There is mildly elevated pulmonary  artery systolic pressure. The estimated right ventricular systolic  pressure is 62.8 mmHg.  4. Left atrial size was grossly normal in size.  5. The pericardial effusion is posterior to the left ventricle.  6. The mitral valve is grossly normal. No evidence of mitral valve  regurgitation. No evidence of mitral stenosis.  7. The aortic valve is grossly normal.  8. The inferior vena cava is dilated in size with >50% respiratory  variability, suggesting right atrial pressure of 8 mmHg.   Coronary angiography 12/24/2020: LM: Normal LAD: Prox mod calcific 40% stenosis (Unchanges compared to cath 2018) LCx: Mid 100% occlusion, likely chronic. Reconstitution through retrograde filling from OM2 (New since cath 2018) RCA: Ostial 100% occlusion, likely the culprit for her STEMI with late presentation Retrograde filling from LAD up to distal RCA (New since cath 2018)  RAL 12 mmHg LVEDP: 10 mmHg Assessment & Recommendations:  55 y.o.Caucasianfemale,pharmacist by profession,withtype 1 diabetes mellitus, inferior STEMI (11/2020) with no revascularization due to late presentation and AKI, complicated by completete AV block, resolved with 1:1 conduction on discharge, returns with asystolic cardiac arrest  Cardiac arrest: Secondary to asystole, spontaneously resolved on arrival to Cath Lab, now resolved.  Intermittent complete AV block: Present on admission Now s/p PPM 4/14 Intermittent pacing seen on telemetry  Cardiogenic shock: Secondary to complete AV block and asystole, now resolved  Type II MI: In the setting of cardiac arrest, recent inferior infarct with residual  mid circumflex CTO and moderate mid LAD disease. Can stop heparin now Continue aspirin, statin  Coronary artery disease: Acute inferior STEMI with ostial RCA occlusion in 11/2020. Mid circumflex CTO and mid LAD 70% stenosis. My initial plan was to perform outpatient cardiac MRI with  stress perfusion to evaluate for both ischemia and viability.  However, patient is now admitted with asystole, currently has temporary pacemaker and will require permanent pacemaker.  Given her labile AV conduction, I do not think it is worth the risk to remove the temporary pacemaker for a cardiac MRI. At this time, her primary issue is AV conduction for which she will need permanent pacemaker regardless.  Also discussed with CT operator Dr. Martinique regarding possibility of left circumflex CTO PCI.  However, we both agreed that, Risks do not outweigh benefits of any potential revascularization at this time. Continue aspirin, heparin, statin. Holding P2Y12 I this hospitalization given PPM placement. Will reconsider re-initiation outpatient.  I will attempt to introduce beta-blocker after placement of permanent pacemaker.   PAF: No recurrence this hospitalization. Only a brief episode during index hospitalization (11/2020) Discontinued eliquis. Monitor for any Afib on pacemaker   Anemia: Most likely anemia of chronic disease. 1 U PRBC 4/14 as per EP. Iron studies normal  AKI: Cr 3.3-->2.7-->1.7-->1.5-->1.6-->1.4 Initially prerenal, now resolving Continue IV lasix 40 mg daily given right heart failure  Type I DM: Patient has reportedly had clotting of her insulin pump ever since being on antiplatelet therapy and Eliquis.  I suspect this may be due to easy bleeding leading to ex vivo clotting in the insulin pump tubing.  May need to discuss this with endocrinologist outpatient. As per diabetes coordinator recommendaitons, Continue Levemir 9 U bid, Novolog 0-9units TID with meals, Novolog 0-5 units QHS, Novolog 5  units TID with meals She will resume insulin pump on discharge home  Discharge planning: OT/PT, cardiac rehab, CM Anticipate discharge on 4/18 Still needs inpatient diuresis    Nigel Mormon, MD Pager: [phone] Office: [phone]

## 2021-01-07 NOTE — Progress Notes (Signed)
CARDIAC REHAB PHASE I   Education completed with pt. Reviewed importance of restrictions, and easing back into exercise. Pt grateful for care she has received. Provided support and encouragement. Pt feeling much better since PPM implant. Receiving lasix plans to ambulate later. Encouraged OOB throughout weekend. Will continue to follow.  [id number] Rufina Falco, RN BSN 01/07/2021 2:18 PM

## 2021-01-07 NOTE — Discharge Instructions (Signed)
    Supplemental Discharge Instructions for  Pacemaker/Defibrillator Patients   Activity No heavy lifting or vigorous activity with your left/right arm for 6 to 8 weeks.  Do not raise your left/right arm above your head for one week.  Gradually raise your affected arm as drawn below.             01/14/21                     01/15/21                    01/16/21                  01/17/21 __  NO DRIVING until cleared to at your wound check visit AND Dr. Virgina Jock  WOUND CARE - Keep the wound area clean and dry.  Do not get this area wet , no showers until cleared to at your wound check visit - The tape/steri-strips on your wound will fall off; do not pull them off.  No bandage is needed on the site.  DO  NOT apply any creams, oils, or ointments to the wound area. - If you notice any drainage or discharge from the wound, any swelling or bruising at the site, or you develop a fever > 101? F after you are discharged home, call the office at once.  Special Instructions - You are still able to use cellular telephones; use the ear opposite the side where you have your pacemaker/defibrillator.  Avoid carrying your cellular phone near your device. - When traveling through airports, show security personnel your identification card to avoid being screened in the metal detectors.  Ask the security personnel to use the hand wand. - Avoid arc welding equipment, MRI testing (magnetic resonance imaging), TENS units (transcutaneous nerve stimulators).  Call the office for questions about other devices. - Avoid electrical appliances that are in poor condition or are not properly grounded. - Microwave ovens are safe to be near or to operate.

## 2021-01-07 NOTE — Evaluation (Signed)
Physical Therapy Evaluation Patient Details Name: Colleen [name] MRN: [id number] DOB: 11/25/1965 Today's Date: 01/07/2021   History of Present Illness  55 y.o. female presents to [facility] ED on 01/02/2021 with reports of worsening lethargy and hypotension. Pt was reported to have syncopal episode when transferring to stretcher. EKG remarkable for complete heart block, pt started on transcutaneous pacing. Pt required intubation on 4/10, extubated same day. Pt underwent PPM placement 4/14. Of note pt with recent admission in March for STEMI without intervention. PMH includes ashtma, DMII, macular edema.  Clinical Impression  Pt presents to PT with deficits in endurance, balance, power, strength, gait, and functional mobility. Pt demonstrates good knowledge of pacemaker precautions but requires intermittent verbal cueing for implementation during mobility. Pt fatigues quickly and will benefit from a gradual increase in frequency and duration of mobility over the weekend and once discharge. PT recommends the patient ambulate out of the room at least 3 times daily for the remainder of this admission. PT also recommends outpatient PT at the time of discharge.    Follow Up Recommendations Outpatient PT;Supervision - Intermittent    Equipment Recommendations  None recommended by PT (pt owns RW)    Recommendations for Other Services       Precautions / Restrictions Precautions Precautions: ICD/Pacemaker Precaution Comments: monitor HR Restrictions Weight Bearing Restrictions: Yes LUE Weight Bearing: Partial weight bearing LUE Partial Weight Bearing Percentage or Pounds: limit pushing/pulling, limit AROM and shoulder flexion/horizontal abduction      Mobility  Bed Mobility Overal bed mobility: Needs Assistance Bed Mobility: Supine to Sit;Sit to Supine     Supine to sit: Supervision Sit to supine: Supervision   General bed mobility comments: verbal cues to maintain PPM precautions     Transfers Overall transfer level: Needs assistance Equipment used: None Transfers: Sit to/from Stand Sit to Stand: Supervision Stand pivot transfers: Supervision          Ambulation/Gait Ambulation/Gait assistance: Supervision Gait Distance (Feet): 140 Feet Assistive device: Rolling walker (2 wheeled) Gait Pattern/deviations: Step-through pattern Gait velocity: reduced Gait velocity interpretation: <1.8 ft/sec, indicate of risk for recurrent falls General Gait Details: pt with slowed step-through gait  Stairs            Wheelchair Mobility    Modified Rankin (Stroke Patients Only)       Balance Overall balance assessment: Needs assistance Sitting-balance support: No upper extremity supported;Feet supported Sitting balance-Leahy Scale: Good     Standing balance support: No upper extremity supported Standing balance-Leahy Scale: Fair Standing balance comment: able to static stand without holding onto RW, but requests RW for EC and stability.                             Pertinent Vitals/Pain Pain Assessment: No/denies pain Pain Score: 2  Pain Location: L shoulder complex Pain Descriptors / Indicators: Tender Pain Intervention(s): Monitored during session    Home Living Family/patient expects to be discharged to:: Private residence Living Arrangements: Spouse/significant other Available Help at Discharge: Family Type of Home: House Home Access: Level entry     Home Layout: Other (Comment) Home Equipment: Shower seat - built in;Grab bars - tub/shower;Cane - single point;Walker - 2 wheels      Prior Function Level of Independence: Independent with assistive device(s)         Comments: at baseline pt intermittently utilizes cane, had progressed to no device since prior admission     Hand Dominance  Dominant Hand: Right    Extremity/Trunk Assessment   Upper Extremity Assessment Upper Extremity Assessment: Defer to OT  evaluation LUE Deficits / Details: pacemaker restrictions LUE Sensation: WNL LUE Coordination: WNL    Lower Extremity Assessment Lower Extremity Assessment: Generalized weakness    Cervical / Trunk Assessment Cervical / Trunk Assessment: Normal  Communication   Communication: No difficulties  Cognition Arousal/Alertness: Awake/alert Behavior During Therapy: WFL for tasks assessed/performed Overall Cognitive Status: Within Functional Limits for tasks assessed                                        General Comments General comments (skin integrity, edema, etc.): VSS, pt fatigues quickly    Exercises     Assessment/Plan    PT Assessment Patient needs continued PT services  PT Problem List Decreased strength;Decreased activity tolerance;Decreased balance;Decreased mobility;Cardiopulmonary status limiting activity       PT Treatment Interventions DME instruction;Gait training;Therapeutic activities;Functional mobility training;Therapeutic exercise;Balance training;Patient/family education    PT Goals (Current goals can be found in the Care Plan section)  Acute Rehab PT Goals Patient Stated Goal: to return to prior level of function PT Goal Formulation: With patient Time For Goal Achievement: 01/21/21 Potential to Achieve Goals: Good    Frequency Min 3X/week   Barriers to discharge        Co-evaluation               AM-PAC PT "6 Clicks" Mobility  Outcome Measure Help needed turning from your back to your side while in a flat bed without using bedrails?: A Little Help needed moving from lying on your back to sitting on the side of a flat bed without using bedrails?: A Little Help needed moving to and from a bed to a chair (including a wheelchair)?: A Little Help needed standing up from a chair using your arms (e.g., wheelchair or bedside chair)?: A Little Help needed to walk in hospital room?: A Little Help needed climbing 3-5 steps with a  railing? : A Lot 6 Click Score: 17    End of Session   Activity Tolerance: Patient limited by fatigue Patient left: in bed;with call bell/phone within reach;with family/visitor present Nurse Communication: Mobility status PT Visit Diagnosis: Other abnormalities of gait and mobility (R26.89);Muscle weakness (generalized) (M62.81)    Time: 0981-1914 PT Time Calculation (min) (ACUTE ONLY): 14 min   Charges:   PT Evaluation $PT Eval Moderate Complexity: 1 Mod        Zenaida Niece, PT, DPT Acute Rehabilitation Pager: [phone]   Zenaida Niece 01/07/2021, 5:44 PM

## 2021-01-08 LAB — CULTURE, BLOOD (ROUTINE X 2)
Culture: NO GROWTH
Culture: NO GROWTH

## 2021-01-08 LAB — TYPE AND SCREEN
ABO/RH(D): O POS
Antibody Screen: NEGATIVE
Unit division: 0

## 2021-01-08 LAB — BASIC METABOLIC PANEL
Anion gap: 8 (ref 5–15)
BUN: 35 mg/dL — ABNORMAL HIGH (ref 6–20)
CO2: 25 mmol/L (ref 22–32)
Calcium: 8.5 mg/dL — ABNORMAL LOW (ref 8.9–10.3)
Chloride: 98 mmol/L (ref 98–111)
Creatinine, Ser: 1.45 mg/dL — ABNORMAL HIGH (ref 0.44–1.00)
GFR, Estimated: 42 mL/min — ABNORMAL LOW (ref >60)
Glucose, Bld: 220 mg/dL — ABNORMAL HIGH (ref 70–99)
Potassium: 4.8 mmol/L (ref 3.5–5.1)
Sodium: 131 mmol/L — ABNORMAL LOW (ref 135–145)

## 2021-01-08 LAB — CBC
HCT: 27.1 % — ABNORMAL LOW (ref 36.0–46.0)
Hemoglobin: 8.6 g/dL — ABNORMAL LOW (ref 12.0–15.0)
MCH: 29.6 pg (ref 26.0–34.0)
MCHC: 31.7 g/dL (ref 30.0–36.0)
MCV: 93.1 fL (ref 80.0–100.0)
Platelets: 164 10*3/uL (ref 150–400)
RBC: 2.91 MIL/uL — ABNORMAL LOW (ref 3.87–5.11)
RDW: 14.7 % (ref 11.5–15.5)
WBC: 3.9 10*3/uL — ABNORMAL LOW (ref 4.0–10.5)
nRBC: 0 % (ref 0.0–0.2)

## 2021-01-08 LAB — GLUCOSE, CAPILLARY
Glucose-Capillary: 198 mg/dL — ABNORMAL HIGH (ref 70–99)
Glucose-Capillary: 149 mg/dL — ABNORMAL HIGH (ref 70–99)
Glucose-Capillary: 212 mg/dL — ABNORMAL HIGH (ref 70–99)
Glucose-Capillary: 164 mg/dL — ABNORMAL HIGH (ref 70–99)

## 2021-01-08 LAB — BPAM RBC
Blood Product Expiration Date: 202205152359
ISSUE DATE / TIME: 202204141047
Unit Type and Rh: 5100

## 2021-01-08 NOTE — Plan of Care (Signed)
  Problem: Clinical Measurements: Goal: Ability to maintain clinical measurements within normal limits will improve Outcome: Progressing   Problem: Activity: Goal: Risk for activity intolerance will decrease Outcome: Progressing   Problem: Pain Managment: Goal: General experience of comfort will improve Outcome: Progressing   Problem: Education: Goal: Knowledge of General Education information will improve Description: Including pain rating scale, medication(s)/side effects and non-pharmacologic comfort measures Outcome: Adequate for Discharge   Problem: Health Behavior/Discharge Planning: Goal: Ability to manage health-related needs will improve Outcome: Adequate for Discharge   Problem: Clinical Measurements: Goal: Will remain free from infection Outcome: Adequate for Discharge Goal: Diagnostic test results will improve Outcome: Adequate for Discharge Goal: Respiratory complications will improve Outcome: Adequate for Discharge Goal: Cardiovascular complication will be avoided Outcome: Adequate for Discharge   Problem: Nutrition: Goal: Adequate nutrition will be maintained Outcome: Adequate for Discharge   Problem: Coping: Goal: Level of anxiety will decrease Outcome: Adequate for Discharge   Problem: Elimination: Goal: Will not experience complications related to bowel motility Outcome: Adequate for Discharge Goal: Will not experience complications related to urinary retention Outcome: Adequate for Discharge   Problem: Safety: Goal: Ability to remain free from injury will improve Outcome: Adequate for Discharge   Problem: Skin Integrity: Goal: Risk for impaired skin integrity will decrease Outcome: Adequate for Discharge   Problem: Education: Goal: Knowledge of cardiac device and self-care will improve Outcome: Adequate for Discharge Goal: Ability to safely manage health related needs after discharge will improve Outcome: Adequate for Discharge Goal:  Individualized Educational Video(s) Outcome: Adequate for Discharge   Problem: Cardiac: Goal: Ability to achieve and maintain adequate cardiopulmonary perfusion will improve Outcome: Adequate for Discharge

## 2021-01-08 NOTE — Plan of Care (Signed)
  Problem: Clinical Measurements: Goal: Diagnostic test results will improve Outcome: Progressing   Problem: Clinical Measurements: Goal: Cardiovascular complication will be avoided Outcome: Progressing   Problem: Activity: Goal: Risk for activity intolerance will decrease Outcome: Progressing

## 2021-01-08 NOTE — Progress Notes (Addendum)
Subjective:  Feels well No chest pain  S/p pacemaker placement 4/14  Objective:  Vital Signs in the last 24 hours: Temp:  [98 F (36.7 C)-98.4 F (36.9 C)] 98.3 F (36.8 C) (04/16 0737) Pulse Rate:  [66-78] 66 (04/16 0737) Resp:  [16-19] 17 (04/16 0737) BP: (118-142)/(65-75) 127/72 (04/16 0737) SpO2:  [97 %-100 %] 100 % (04/16 0737) Weight:  [100.9 kg] 100.9 kg (04/16 0007)  Intake/Output from previous day: 04/15 0701 - 04/16 0700 In: 5732 [P.O.:1220] Out: 1950 [Urine:1950]  Physical Exam Vitals and nursing note reviewed.  Constitutional:      General: She is not in acute distress.    Appearance: She is well-developed.  HENT:     Head: Normocephalic and atraumatic.  Eyes:     Conjunctiva/sclera: Conjunctivae normal.     Pupils: Pupils are equal, round, and reactive to light.  Neck:     Vascular: No JVD.  Cardiovascular:     Rate and Rhythm: Normal rate and regular rhythm.     Pulses: Normal pulses and intact distal pulses.     Heart sounds: No murmur heard.   Pulmonary:     Effort: Pulmonary effort is normal.     Breath sounds: Normal breath sounds. No wheezing or rales.  Chest:     Comments: S/p pacemaker left upper chest Steristrips in place No hematoma Abdominal:     General: Bowel sounds are normal.     Palpations: Abdomen is soft.     Tenderness: There is no rebound.  Musculoskeletal:        General: No tenderness. Normal range of motion.     Right lower leg: Edema (1+) present.     Left lower leg: Edema (1+) present.  Lymphadenopathy:     Cervical: No cervical adenopathy.  Skin:    General: Skin is warm and dry.  Neurological:     Mental Status: She is alert and oriented to person, place, and time.     Cranial Nerves: No cranial nerve deficit.      Lab Results: BMP Recent Labs    01/06/21 0739 01/07/21 0334 01/08/21 0228  NA 130* 127* 131*  K 5.0 5.4* 4.8  CL 100 99 98  CO2 23 24 25   GLUCOSE 253* 245* 220*  BUN 36* 32* 35*   CREATININE 1.69* 1.45* 1.45*  CALCIUM 8.5* 8.6* 8.5*  GFRNONAA 35* 42* 42*    CBC Recent Labs  Lab 01/02/21 0136 01/02/21 0141 01/08/21 0228  WBC 9.5   < > 3.9*  RBC 3.48*   < > 2.91*  HGB 10.6*   < > 8.6*  HCT 35.0*   < > 27.1*  PLT 178   < > 164  MCV 100.6*   < > 93.1  MCH 30.5   < > 29.6  MCHC 30.3   < > 31.7  RDW 14.5   < > 14.7  LYMPHSABS 2.3  --   --   MONOABS 0.7  --   --   EOSABS 0.1  --   --   BASOSABS 0.0  --   --    < > = values in this interval not displayed.    HEMOGLOBIN A1C Lab Results  Component Value Date   HGBA1C 7.5 (H) 01/02/2021   MPG 168.55 01/02/2021    Cardiac Panel (last 3 results) No results for input(s): CKTOTAL, CKMB, TROPONINI, RELINDX in the last 8760 hours.  BNP (last 3 results) Recent Labs    01/02/21 0406 01/03/21  0815 01/05/21 0035  BNP 275.4* 1,557.4* 1,578.7*    TSH Recent Labs    12/21/20 1012  TSH 1.862    Lipid Panel     Component Value Date/Time   CHOL 118 01/02/2021 0136   CHOL 164 10/08/2019 0901   TRIG 80 01/03/2021 0527   HDL 42 01/02/2021 0136   HDL 99 10/08/2019 0901   CHOLHDL 2.8 01/02/2021 0136   VLDL 42 (H) 01/02/2021 0136   LDLCALC 34 01/02/2021 0136   LDLCALC 54 10/08/2019 0901   LDLDIRECT 61 10/08/2019 0901     Hepatic Function Panel Recent Labs    12/20/20 0415 12/21/20 0328 12/23/20 0248 12/27/20 0941 01/02/21 0136  PROT 5.6*   < > 5.1* 5.7* 5.6*  ALBUMIN 3.4*   < > 2.8* 2.8* 2.9*  AST 4,893*   < > 628* 61* 45*  ALT 2,931*   < > 1,379* 343* 81*  ALKPHOS 114   < > 104 152* 153*  BILITOT 2.0*   < > 2.2* 1.9* 1.7*  BILIDIR 0.5*  --   --  0.5*  --   IBILI 1.5*  --   --  1.4*  --    < > = values in this interval not displayed.    Cardiac Studies:  EKG 01/02/2021: Sinus rhythm 97 bpm Inferior infarct, age indeterminate  Echocardiogram 01/02/2021: 1. LIMITED ECHOCARDIOGRAM; Full Study dated 12/19/2020.  2. Left ventricular ejection fraction, by estimation, is 50 to 55%. The   left ventricle has low normal function. Diastolic function not assessed  due to limited study.  3. Right ventricular systolic function is mildly reduced. The right  ventricular size is mildly enlarged. There is mildly elevated pulmonary  artery systolic pressure. The estimated right ventricular systolic  pressure is 16.1 mmHg.  4. Left atrial size was grossly normal in size.  5. The pericardial effusion is posterior to the left ventricle.  6. The mitral valve is grossly normal. No evidence of mitral valve  regurgitation. No evidence of mitral stenosis.  7. The aortic valve is grossly normal.  8. The inferior vena cava is dilated in size with >50% respiratory  variability, suggesting right atrial pressure of 8 mmHg.   Coronary angiography 12/24/2020: LM: Normal LAD: Prox mod calcific 40% stenosis (Unchanges compared to cath 2018) LCx: Mid 100% occlusion, likely chronic. 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 Continue aspirin, statin  Coronary artery disease: Acute inferior STEMI with ostial RCA occlusion in 11/2020. Mid circumflex CTO and mid LAD 70% stenosis. My initial plan was to perform outpatient  cardiac MRI with stress perfusion to evaluate for both ischemia and viability.  However, patient is now admitted with asystole, currently has temporary pacemaker and will require permanent pacemaker.  Given her labile AV conduction, I do not think it is worth the risk to remove  the temporary pacemaker for a cardiac MRI. At this time, her primary issue is AV conduction for which she will need permanent pacemaker regardless.  Also discussed with CT operator Dr. Martinique regarding possibility of left circumflex CTO PCI.  However, we both agreed that, Risks do not outweigh benefits of any potential revascularization at this time. Continue aspirin, heparin, statin. Holding P2Y12 I this hospitalization given PPM placement. Will reconsider re-initiation outpatient.  I will attempt to introduce beta-blocker after placement of permanent pacemaker.   PAF: No recurrence this hospitalization. Only a brief episode during index hospitalization (11/2020) Discontinued eliquis. Monitor for any Afib on pacemaker   Anemia: Most likely anemia of chronic disease, as well as secondary to dilution from volume overload 1 U PRBC 4/14 as per EP. Iron studies normal  AKI: Cr 3.3-->2.7-->1.7-->1.5-->1.6-->1.4 Initially prerenal, now resolved Continue IV lasix 40 mg daily given right heart failure  Type I DM: Patient has reportedly had clotting of her insulin pump ever since being on antiplatelet therapy and Eliquis.  I suspect this may be due to easy bleeding leading to ex vivo clotting in the insulin pump tubing.  May need to discuss this with endocrinologist outpatient. As per diabetes coordinator recommendaitons, Continue Levemir 9 U bid, Novolog 0-9units TID with meals, Novolog 0-5 units QHS, Novolog 5 units TID with meals She will resume insulin pump on discharge home  Cough: Likely post pneumonia. Continue robitussin  Discharge planning: Appreciate OT/PT, cardiac rehab, CM Anticipate discharge on 4/18 pending  further inpatient IV diuresis    Nigel Mormon, MD Pager: [phone] Office: [phone]

## 2021-01-08 NOTE — Progress Notes (Signed)
CARDIAC REHAB PHASE I   CR RN in to ambulate with patient. Patient recently received diuretic and resting in bed with [place] cath. States she has recently walked hallway independently with RW and will continue to ambulate 3 x day between diuresing. Patient declines ambulation at this time. Anticipate D/C home Monday per Dr. Virgina Jock.  Marti Mclane Minus Breeding RN, BSN

## 2021-01-09 LAB — BASIC METABOLIC PANEL
Anion gap: 7 (ref 5–15)
BUN: 35 mg/dL — ABNORMAL HIGH (ref 6–20)
CO2: 28 mmol/L (ref 22–32)
Calcium: 8.2 mg/dL — ABNORMAL LOW (ref 8.9–10.3)
Chloride: 97 mmol/L — ABNORMAL LOW (ref 98–111)
Creatinine, Ser: 1.32 mg/dL — ABNORMAL HIGH (ref 0.44–1.00)
GFR, Estimated: 47 mL/min — ABNORMAL LOW (ref >60)
Glucose, Bld: 132 mg/dL — ABNORMAL HIGH (ref 70–99)
Potassium: 4.2 mmol/L (ref 3.5–5.1)
Sodium: 132 mmol/L — ABNORMAL LOW (ref 135–145)

## 2021-01-09 LAB — GLUCOSE, CAPILLARY
Glucose-Capillary: 101 mg/dL — ABNORMAL HIGH (ref 70–99)
Glucose-Capillary: 130 mg/dL — ABNORMAL HIGH (ref 70–99)
Glucose-Capillary: 263 mg/dL — ABNORMAL HIGH (ref 70–99)
Glucose-Capillary: 322 mg/dL — ABNORMAL HIGH (ref 70–99)

## 2021-01-09 MED ORDER — SACUBITRIL-VALSARTAN 24-26 MG PO TABS
1.0000 | ORAL_TABLET | Freq: Two times a day (BID) | ORAL | Status: DC
  Administered 2021-01-09 – 2021-01-10 (×3): 1 via ORAL
  Filled 2021-01-09 (×3): qty 1

## 2021-01-09 MED ORDER — FUROSEMIDE 10 MG/ML IJ SOLN
20.0000 mg | Freq: Once | INTRAMUSCULAR | Status: AC
  Administered 2021-01-09: 20 mg via INTRAVENOUS
  Filled 2021-01-09: qty 2

## 2021-01-09 NOTE — Progress Notes (Signed)
Subjective:  Feels well No chest pain  S/p pacemaker placement 4/14 3.6 L urine output/24 hrs  Objective:  Vital Signs in the last 24 hours: Temp:  [97.5 F (36.4 C)-98.6 F (37 C)] 98 F (36.7 C) (04/17 0835) Pulse Rate:  [61-68] 67 (04/17 1000) Resp:  [17-20] 20 (04/17 0835) BP: (113-145)/(66-74) 145/68 (04/17 1000) SpO2:  [96 %-100 %] 100 % (04/17 1000) Weight:  [100.3 kg] 100.3 kg (04/17 0342)  Intake/Output from previous day: 04/16 0701 - 04/17 0700 In: 1200 [P.O.:1200] Out: 3601 [Urine:3600; Stool:1]  Physical Exam Vitals and nursing note reviewed.  Constitutional:      General: She is not in acute distress.    Appearance: She is well-developed.  HENT:     Head: Normocephalic and atraumatic.  Eyes:     Conjunctiva/sclera: Conjunctivae normal.     Pupils: Pupils are equal, round, and reactive to light.  Neck:     Vascular: No JVD.  Cardiovascular:     Rate and Rhythm: Normal rate and regular rhythm.     Pulses: Normal pulses and intact distal pulses.     Heart sounds: No murmur heard.   Pulmonary:     Effort: Pulmonary effort is normal.     Breath sounds: Normal breath sounds. No wheezing or rales.  Chest:     Comments: S/p pacemaker left upper chest Steristrips in place No hematoma Abdominal:     General: Bowel sounds are normal.     Palpations: Abdomen is soft.     Tenderness: There is no rebound.  Musculoskeletal:        General: No tenderness. Normal range of motion.     Right lower leg: Edema (1+) present.     Left lower leg: Edema (1+) present.  Lymphadenopathy:     Cervical: No cervical adenopathy.  Skin:    General: Skin is warm and dry.  Neurological:     Mental Status: She is alert and oriented to person, place, and time.     Cranial Nerves: No cranial nerve deficit.      Lab Results: BMP Recent Labs    01/07/21 0334 01/08/21 0228 01/09/21 0334  NA 127* 131* 132*  K 5.4* 4.8 4.2  CL 99 98 97*  CO2 24 25 28   GLUCOSE 245*  220* 132*  BUN 32* 35* 35*  CREATININE 1.45* 1.45* 1.32*  CALCIUM 8.6* 8.5* 8.2*  GFRNONAA 42* 42* 47*    CBC Recent Labs  Lab 01/08/21 0228  WBC 3.9*  RBC 2.91*  HGB 8.6*  HCT 27.1*  PLT 164  MCV 93.1  MCH 29.6  MCHC 31.7  RDW 14.7    HEMOGLOBIN A1C Lab Results  Component Value Date   HGBA1C 7.5 (H) 01/02/2021   MPG 168.55 01/02/2021    Cardiac Panel (last 3 results) No results for input(s): CKTOTAL, CKMB, TROPONINI, RELINDX in the last 8760 hours.  BNP (last 3 results) Recent Labs    01/02/21 0406 01/03/21 0815 01/05/21 0035  BNP 275.4* 1,557.4* 1,578.7*    TSH Recent Labs    12/21/20 1012  TSH 1.862    Lipid Panel     Component Value Date/Time   CHOL 118 01/02/2021 0136   CHOL 164 10/08/2019 0901   TRIG 80 01/03/2021 0527   HDL 42 01/02/2021 0136   HDL 99 10/08/2019 0901   CHOLHDL 2.8 01/02/2021 0136   VLDL 42 (H) 01/02/2021 0136   LDLCALC 34 01/02/2021 0136   LDLCALC 54 10/08/2019 0901  LDLDIRECT 61 10/08/2019 0901     Hepatic Function Panel Recent Labs    12/20/20 0415 12/21/20 0328 12/23/20 0248 12/27/20 0941 01/02/21 0136  PROT 5.6*   < > 5.1* 5.7* 5.6*  ALBUMIN 3.4*   < > 2.8* 2.8* 2.9*  AST 4,893*   < > 628* 61* 45*  ALT 2,931*   < > 1,379* 343* 81*  ALKPHOS 114   < > 104 152* 153*  BILITOT 2.0*   < > 2.2* 1.9* 1.7*  BILIDIR 0.5*  --   --  0.5*  --   IBILI 1.5*  --   --  1.4*  --    < > = values in this interval not displayed.    Cardiac Studies:  EKG 01/02/2021: Sinus rhythm 97 bpm Inferior infarct, age indeterminate  Echocardiogram 01/02/2021: 1. LIMITED ECHOCARDIOGRAM; Full Study dated 12/19/2020.  2. Left ventricular ejection fraction, by estimation, is 50 to 55%. The  left ventricle has low normal function. Diastolic function not assessed  due to limited study.  3. Right ventricular systolic function is mildly reduced. The right  ventricular size is mildly enlarged. There is mildly elevated pulmonary   artery systolic pressure. The estimated right ventricular systolic  pressure is 88.4 mmHg.  4. Left atrial size was grossly normal in size.  5. The pericardial effusion is posterior to the left ventricle.  6. The mitral valve is grossly normal. No evidence of mitral valve  regurgitation. No evidence of mitral stenosis.  7. The aortic valve is grossly normal.  8. The inferior vena cava is dilated in size with >50% respiratory  variability, suggesting right atrial pressure of 8 mmHg.   Coronary angiography 12/24/2020: LM: Normal LAD: Prox mod calcific 40% stenosis (Unchanges compared to cath 2018) LCx: Mid 100% occlusion, likely chronic. 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 Continue aspirin, statin  Coronary artery disease: Acute inferior STEMI with ostial RCA occlusion in 11/2020. Mid circumflex CTO and mid LAD 70% stenosis. My initial plan was to perform outpatient cardiac MRI with stress perfusion to evaluate for both ischemia and viability.  However, patient is now admitted with asystole, currently has temporary pacemaker and will require permanent pacemaker.  Given her labile AV conduction, I do not think  it is worth the risk to remove the temporary pacemaker for a cardiac MRI. At this time, her primary issue is AV conduction for which she will need permanent pacemaker regardless.  Also discussed with CT operator Dr. Martinique regarding possibility of left circumflex CTO PCI.  However, we both agreed that, Risks do not outweigh benefits of any potential revascularization at this time. Continue aspirin, heparin, statin. Holding P2Y12 I this hospitalization given PPM placement. Will reconsider re-initiation outpatient.  Tolerating metoprolol succinate 12.5 mg daily  Right heart failure: Added Entresto 24-26 mg bid today Tolerating  metoprolol succinate 12.5 mg daily.  PAF: No recurrence this hospitalization. Only a brief episode during index hospitalization (11/2020) Discontinued eliquis. Monitor for any Afib on pacemaker   Anemia: Most likely anemia of chronic disease, as well as secondary to dilution from volume overload 1 U PRBC 4/14 as per EP. Iron studies normal  AKI: Cr 3.3-->2.7-->1.7-->1.5-->1.6-->1.4-->1.3 Initially prerenal, now resolved Continue IV lasix 40 mg daily given right heart failure  Type I DM: Patient has reportedly had clotting of her insulin pump ever since being on antiplatelet therapy and Eliquis.  I suspect this may be due to easy bleeding leading to ex vivo clotting in the insulin pump tubing.  May need to discuss this with endocrinologist outpatient. As per diabetes coordinator recommendaitons, Continue Levemir 9 U bid, Novolog 0-9units TID with meals, Novolog 0-5 units QHS, Novolog 5 units TID with meals She will resume insulin pump on discharge home  Cough: Likely post pneumonia. Improving  Discharge planning: Appreciate OT/PT, cardiac rehab, CM Anticipate discharge on 4/18 pending further inpatient IV diuresis    Nigel Mormon, MD Pager: [phone] Office: [phone]

## 2021-01-10 ENCOUNTER — Other Ambulatory Visit (HOSPITAL_COMMUNITY): Payer: Self-pay

## 2021-01-10 ENCOUNTER — Other Ambulatory Visit: Payer: Self-pay | Admitting: Cardiology

## 2021-01-10 DIAGNOSIS — N179 Acute kidney failure, unspecified: Secondary | ICD-10-CM

## 2021-01-10 LAB — CBC
HCT: 28.7 % — ABNORMAL LOW (ref 36.0–46.0)
Hemoglobin: 9.2 g/dL — ABNORMAL LOW (ref 12.0–15.0)
MCH: 30.3 pg (ref 26.0–34.0)
MCHC: 32.1 g/dL (ref 30.0–36.0)
MCV: 94.4 fL (ref 80.0–100.0)
Platelets: 199 10*3/uL (ref 150–400)
RBC: 3.04 MIL/uL — ABNORMAL LOW (ref 3.87–5.11)
RDW: 15.2 % (ref 11.5–15.5)
WBC: 3.3 10*3/uL — ABNORMAL LOW (ref 4.0–10.5)
nRBC: 0 % (ref 0.0–0.2)

## 2021-01-10 LAB — BASIC METABOLIC PANEL
Anion gap: 7 (ref 5–15)
BUN: 40 mg/dL — ABNORMAL HIGH (ref 6–20)
CO2: 29 mmol/L (ref 22–32)
Calcium: 8 mg/dL — ABNORMAL LOW (ref 8.9–10.3)
Chloride: 97 mmol/L — ABNORMAL LOW (ref 98–111)
Creatinine, Ser: 1.68 mg/dL — ABNORMAL HIGH (ref 0.44–1.00)
GFR, Estimated: 35 mL/min — ABNORMAL LOW (ref >60)
Glucose, Bld: 224 mg/dL — ABNORMAL HIGH (ref 70–99)
Potassium: 4.6 mmol/L (ref 3.5–5.1)
Sodium: 133 mmol/L — ABNORMAL LOW (ref 135–145)

## 2021-01-10 MED ORDER — METOPROLOL SUCCINATE ER 25 MG PO TB24
12.5000 mg | ORAL_TABLET | Freq: Every day | ORAL | 3 refills | Status: DC
  Filled 2021-01-10: qty 15, 30d supply, fill #0

## 2021-01-10 NOTE — Addendum Note (Signed)
Addended by: Nigel Mormon on: 01/10/2021 09:30 AM   Modules accepted: Orders

## 2021-01-10 NOTE — TOC Transition Note (Signed)
Transition of Care [facility]) - CM/SW Discharge Note   Patient Details  Name: TALIAH PORCHE MRN: [id number] Date of Birth: 1965-10-12  [facility] CM/SW Contact:  Zenon Mayo, RN Phone Number: 01/10/2021, 9:37 AM   Clinical Narrative:    Patient is for dc today, she will be going to outpatient physical therapy.  Per MD , he will give her the script when she comes to see him on her follow up as outpatient at his office.  This NCM informed patient of this information.  She has no other needs.  Final next level of care: Home/Self Care Barriers to Discharge: No Barriers Identified   Patient Goals and CMS Choice Patient states their goals for this hospitalization and ongoing recovery are:: return home   Choice offered to / list presented to : NA  Discharge Placement                       Discharge Plan and Services In-house Referral: NA Discharge Planning Services: CM Consult Post Acute Care Choice: NA            DME Agency: NA       HH Arranged: NA          Social Determinants of Health (SDOH) Interventions     Readmission Risk Interventions Readmission Risk Prevention Plan 01/07/2021 12/30/2020  Transportation Screening Complete Complete  PCP or Specialist Appt within 5-7 Days - Complete  Home Care Screening - Complete  Medication Review (RN CM) - Complete  HRI or Home Care Consult Complete -  Social Work Consult for [place] Planning/Counseling Complete -  Palliative Care Screening Not Applicable -  Medication Review Press photographer) Complete -  Some recent data might be hidden

## 2021-01-10 NOTE — Progress Notes (Signed)
CARDIAC REHAB PHASE I   Offered to walk with pt. Pt states independent ambulation with front wheel walker. Feeling more herself and gaining her strength back. Reviewed restrictions and exercise guidelines. Previously referred to [facility] [place]. Pt grateful for care she has received.   [phone] Rufina Falco, RN BSN 01/10/2021 9:49 AM

## 2021-01-10 NOTE — Progress Notes (Signed)
D/C instructions given and reviewed. Tele and IV removed, tolerated well. Awaiting TOC med delivery. 

## 2021-01-10 NOTE — Progress Notes (Signed)
All belongings packed and taken with patient at this time.  Being discharged with Husband to personal car.  Rx received from pharmacy.  No further questions regarding discharge.  No s/s of distress denies pain.

## 2021-01-10 NOTE — Discharge Summary (Signed)
Physician Discharge Summary  Patient ID: Colleen [name] MRN: [id number] DOB/AGE: 57-Feb-1967 57 y.o.  Admit date: 01/02/2021 Discharge date: 01/10/2021  Primary Discharge Diagnosis: Asystole Cardiac arrest Complete AV block Type 2 myocardial infarction Right heart failure Acute kidney injury  Secondary Discharge Diagnosis: Coronary artery disease Type 1 diabetes melitus Chronic kidney disease  Hospital Course:   55 y.o. Caucasian female  with hypertension, type 1 diabetes mellitus, recent history of acute myocardial infarction secondary to ostial RCA occlusion, medically managed due to late presentation acute kidney injury, complicated by complete AV block resolved on discharge.  Patient was discharged home on 12/30/2020 after thorough evaluation by electrophysiology and confirmation of improvement in AV conduction, not requiring pacemaker.  However, since going home, patient had worsening lightheadedness and episode of cardiac arrest.  She was brought to the emergency room underwent CPR and intubation.  She was taken emergently to Cath Lab given her complete AV block (EKG not available on chart), and underwent emergency temporary pacemaker placement by Dr. Martinique.  Patient also acute kidney injury with creatinine as high as 3.3.  Through the hospital course, patient remarkably improved, was successfully extubated.  She was treated for possible pneumonia with IV antibiotics.  After repeat blood cultures were negative, she then underwent permanent pacemaker placement.  Troponin was mildly elevated through the hospital course, attributed to be type II myocardial infarction.  Given no recurrence of atrial fibrillation, however Eliquis was discontinued.  Plavix was held given recent pacemaker placement, and will be resumed on follow-up.  She was diuresed net -8L with IV Lasix.  On 01/09/2021, [name] was reintroduced given her right-sided heart failure.  Her creatinine did increase from  1.3-1.68.  However, she put out 3 L of urine.  I suspect increase in creatinine was owing to overdiuresis as well as initiation of Entresto.  On discharge, I discontinued IV Lasix placed patient on low-dose diuretics of p.o. torsemide 20 mg daily.  Continued Entresto 24-26 mg twice daily, as well as metoprolol succinate 12.5 mg daily for medical management of right-sided heart failure.  Follow-up outpatient level was arranged for 01/12/2021, followed by outpatient follow-up on 01/14/2021.  Physical therapy, Occupational Therapy, and cardiac rehab evaluated the patient prior to discharge.     Discharge Exam: Blood pressure 128/69, pulse 63, temperature 98.1 F (36.7 C), temperature source Oral, resp. rate 18, height 5\' 3"  (1.6 m), weight 99.2 kg, SpO2 100 %.   Physical Exam Vitals and nursing note reviewed.  Constitutional:      General: She is not in acute distress.    Appearance: She is well-developed.  HENT:     Head: Normocephalic and atraumatic.  Eyes:     Conjunctiva/sclera: Conjunctivae normal.     Pupils: Pupils are equal, round, and reactive to light.  Neck:     Vascular: No JVD.  Cardiovascular:     Rate and Rhythm: Normal rate and regular rhythm.     Pulses: Normal pulses and intact distal pulses.     Heart sounds: No murmur heard.   Pulmonary:     Effort: Pulmonary effort is normal.     Breath sounds: Normal breath sounds. No wheezing or rales.  Chest:     Comments: Pacemaker site on left upper chest without hematoma Steristrips in place Abdominal:     General: Bowel sounds are normal.     Palpations: Abdomen is soft.     Tenderness: There is no rebound.  Musculoskeletal:  General: No tenderness. Normal range of motion.     Right lower leg: No edema.     Left lower leg: No edema.  Lymphadenopathy:     Cervical: No cervical adenopathy.  Skin:    General: Skin is warm and dry.  Neurological:     Mental Status: She is alert and oriented to person, place, and  time.     Cranial Nerves: No cranial nerve deficit.        Significant Diagnostic Studies:  EKG 01/07/2021: Sinus rhythm with 1st degree A-V block with Premature atrial complexes with Abberant conduction  EKG 01/02/2021: Sinus rhythm 97 bpm Inferior infarct, age indeterminate  Echocardiogram 01/02/2021: 1. LIMITED ECHOCARDIOGRAM; Full Study dated 12/19/2020.  2. Left ventricular ejection fraction, by estimation, is 50 to 55%. The  left ventricle has low normal function. Diastolic function not assessed  due to limited study.  3. Right ventricular systolic function is mildly reduced. The right  ventricular size is mildly enlarged. There is mildly elevated pulmonary  artery systolic pressure. The estimated right ventricular systolic  pressure is 94.8 mmHg.  4. Left atrial size was grossly normal in size.  5. The pericardial effusion is posterior to the left ventricle.  6. The mitral valve is grossly normal. No evidence of mitral valve  regurgitation. No evidence of mitral stenosis.  7. The aortic valve is grossly normal.  8. The inferior vena cava is dilated in size with >50% respiratory  variability, suggesting right atrial pressure of 8 mmHg.   Coronary angiography 12/24/2020: LM: Normal LAD: Prox mod calcific 40% stenosis (Unchanges compared to cath 2018) LCx: Mid 100% occlusion, likely chronic. Reconstitution through retrograde filling from OM2 (New since cath 2018) RCA: Ostial 100% occlusion, likely the culprit for her STEMI with late presentation Retrograde filling from LAD up to distal RCA (New since cath 2018)  RAL 12 mmHg LVEDP: 10 mmHg  Labs:   Lab Results  Component Value Date   WBC 3.3 (L) 01/10/2021   HGB 9.2 (L) 01/10/2021   HCT 28.7 (L) 01/10/2021   MCV 94.4 01/10/2021   PLT 199 01/10/2021    Recent Labs  Lab 01/10/21 0409  NA 133*  K 4.6  CL 97*  CO2 29  BUN 40*  CREATININE 1.68*  CALCIUM 8.0*  GLUCOSE 224*    Lipid Panel      Component Value Date/Time   CHOL 118 01/02/2021 0136   CHOL 164 10/08/2019 0901   TRIG 80 01/03/2021 0527   HDL 42 01/02/2021 0136   HDL 99 10/08/2019 0901   CHOLHDL 2.8 01/02/2021 0136   VLDL 42 (H) 01/02/2021 0136   LDLCALC 34 01/02/2021 0136   LDLCALC 54 10/08/2019 0901    BNP (last 3 results) Recent Labs    01/02/21 0406 01/03/21 0815 01/05/21 0035  BNP 275.4* 1,557.4* 1,578.7*    HEMOGLOBIN A1C Lab Results  Component Value Date   HGBA1C 7.5 (H) 01/02/2021   MPG 168.55 01/02/2021    Cardiac Panel (last 3 results) Results for CAMYRA, VAETH (MRN [id number]) as of 01/10/2021 16:24  Ref. Range 01/02/2021 01:36 01/02/2021 04:06 01/02/2021 09:21 01/02/2021 12:22  Troponin I (High Sensitivity) Latest Ref Range: <18 ng/L 462 (HH) 505 (HH) 887 (HH) 5,036 (HH)   TSH Recent Labs    12/21/20 1012  TSH 1.862    Radiology: DG Chest 1 View  Result Date: 12/20/2020 CLINICAL DATA:  Transvenous pacemaker placement EXAM: CHEST  1 VIEW COMPARISON:  09/25/2011 FINDINGS: Inferiorly approaching trans  venous pacemaker lead is seen overlying the expected right ventricle. Lung volumes are small, but are symmetric and are clear. Right internal jugular central venous catheter tip overlies the expected innominate vein. No pneumothorax or pleural effusion. Cardiac size is within normal limits. Pulmonary vascularity is normal. No acute bone abnormality. IMPRESSION: Inferiorly approaching trans venous pacemaker lead overlies the expected right ventricle. Electronically Signed   By: Fidela Salisbury MD   On: 12/20/2020 00:03   DG Chest 1 View  Result Date: 12/19/2020 CLINICAL DATA:  Encounter for central line placement. Verification of temporary pacing lead location. EXAM: CHEST  1 VIEW COMPARISON:  Radiograph earlier today. FINDINGS: Single fluoroscopic spot view of the lower chest is obtained. The right internal jugular central catheter on prior radiograph is not seen on this view. There multiple  overlying monitoring devices. Presumed pacing lead from inferior projects over the expected location of the central/right heart. IMPRESSION: Single fluoroscopic spot view of the lower chest. Pacing lead is tentatively visualized projecting over the central/right heart. Electronically Signed   By: Keith Rake M.D.   On: 12/19/2020 22:07   DG Chest 2 View  Result Date: 01/07/2021 CLINICAL DATA:  Pacemaker placement EXAM: CHEST - 2 VIEW COMPARISON:  01/03/2021 FINDINGS: Lungs are clear. No pneumothorax or pleural effusion. Left subclavian dual lead pacemaker has been place with leads overlying the right atrium and right ventricle. Cardiac size within normal limits. Pulmonary vascularity is normal. No acute bone abnormality. IMPRESSION: Left subclavian dual lead pacemaker with leads within the right atrium and right ventricle. No pneumothorax. Electronically Signed   By: Fidela Salisbury MD   On: 01/07/2021 07:29   CT Head Wo Contrast  Result Date: 12/19/2020 CLINICAL DATA:  Confusion.  Recent fall EXAM: CT HEAD WITHOUT CONTRAST TECHNIQUE: Contiguous axial images were obtained from the base of the skull through the vertex without intravenous contrast. COMPARISON:  03/22/2017 FINDINGS: Brain: No evidence of infarction, hemorrhage, hydrocephalus, extra-axial collection or mass lesion/mass effect. Vascular: Negative Skull: Negative for fracture Sinuses/Orbits: Bilateral cataract resection. Other: These results were called by telephone at the time of interpretation on 12/19/2020 at 8:31 am to provider MELANIE BELFI , who verbally acknowledged these results. IMPRESSION: Normal appearance of the brain. Electronically Signed   By: Monte Fantasia M.D.   On: 12/19/2020 08:31   CARDIAC CATHETERIZATION  Result Date: 01/02/2021 Successful insertion of temporary transvenous Pacemaker. Plan: Per CCM. Primary cardiology to reconsult EP service for consideration of permanent pacemaker.   CARDIAC  CATHETERIZATION  Result Date: 11/03/9369  LV end diastolic pressure is normal.  LM: Normal LAD: Prox mod calcific 40% stenosis (Unchanges compared to cath 2018) LCx: Mid 100% occlusion, likely chronic. Reconstitution through retrograde filling from OM2 (New since cath 2018) RCA: Ostial 100% occlusion, likely the culprit for her STEMI with late presentation         Retrograde filling from LAD up to distal RCA (New since cath 2018) RAL 12 mmHg LVEDP: 10 mmHg I attempted wiring LCx to see if this lesion was CTO Will discuss ischemia guided revascularization in future for LCx +/- LAD. Nigel Mormon, MD Pager: [phone] Office: [phone]  CARDIAC CATHETERIZATION  Addendum Date: 12/19/2020   Successful temporary pacemaker placement. Nigel Mormon, MD Pager: [phone] Office: [phone]   Result Date: 12/19/2020 Successful temporary pacemaker placement. Nigel Mormon, MD Pager: [phone] Office: [phone]   CARDIAC CATHETERIZATION  Result Date: 12/19/2020 Successful temporary pacemaker placement Nigel Mormon, MD Pager: [phone] Office: [phone]  US RENAL  Result Date: 12/19/2020 CLINICAL DATA:  55 year old female with acute kidney injury. EXAM: RENAL / URINARY TRACT ULTRASOUND COMPLETE COMPARISON:  10/15/2016 MR FINDINGS: Right Kidney: Renal measurements: 8.9 x 4.3 x 5.5 cm = volume: 109 mL. Echogenicity within normal limits. No solid mass or hydronephrosis visualized. A 2 cm cyst is noted. Left Kidney: Renal measurements: 9.8 x 4.7 x 4.7 cm = volume: 112 mL. Echogenicity within normal limits. No solid mass or hydronephrosis visualized. Two cysts are noted. Bladder: Appears normal for degree of bladder distention. Other: None. IMPRESSION: Unremarkable renal ultrasound except for scattered renal cysts. No hydronephrosis. Electronically Signed   By: Margarette Canada M.D.   On: 12/19/2020 14:41   DG CHEST PORT 1 VIEW  Result Date: 01/03/2021 CLINICAL DATA:   Fever EXAM: PORTABLE CHEST 1 VIEW COMPARISON:  01/02/2021 FINDINGS: Interval extubation. Pulmonary insufflation has been preserved. Lungs are clear. No pneumothorax or pleural effusion. Inferiorly approaching intravenous pacemaker lead is unchanged overlying the expected right ventricle toward the apex. Cardiac size within normal limits. Pulmonary vascularity is normal. No acute bone abnormality. IMPRESSION: Interval extubation with preservation of pulmonary insufflation. Lungs are clear. Electronically Signed   By: Fidela Salisbury MD   On: 01/03/2021 05:41   DG Chest Port 1 View  Result Date: 01/02/2021 CLINICAL DATA:  Intubation EXAM: PORTABLE CHEST 1 VIEW COMPARISON:  Earlier today FINDINGS: Endotracheal tube with tip between the clavicular heads and carina. The enteric tube reaches the stomach. Pacer lead from below with tip at the right ventricular level. Cardiomegaly which is unchanged. Artifact from EKG leads. No convincing edema. No effusion or pneumothorax. IMPRESSION: Temporary pacer lead from below with tip over the right ventricular level. Otherwise stable from earlier the same day. Electronically Signed   By: Monte Fantasia M.D.   On: 01/02/2021 05:20   DG Chest Port 1 View  Result Date: 01/02/2021 CLINICAL DATA:  Heart block intubation EXAM: PORTABLE CHEST 1 VIEW COMPARISON:  12/30/2020, 12/23/2020 FINDINGS: Interval intubation, tip of the endotracheal tube is just above the carina. Esophageal tube tip below the diaphragm but incompletely visualized. Mild cardiomegaly without edema or focal airspace disease. Aortic atherosclerosis IMPRESSION: Interval intubation with tip of the endotracheal tube just above the carina. Cardiomegaly Electronically Signed   By: Donavan Foil M.D.   On: 01/02/2021 01:51   DG CHEST PORT 1 VIEW  Result Date: 12/30/2020 CLINICAL DATA:  Fever. EXAM: PORTABLE CHEST 1 VIEW COMPARISON:  Chest x-ray 12/23/2020. FINDINGS: Mediastinum hilar structures normal. Borderline  cardiomegaly again noted. No pulmonary venous congestion. No focal infiltrate. Mild right base subsegmental atelectasis. No pleural effusion noted on today's exam. No pneumothorax. IMPRESSION: 1. Borderline cardiomegaly again noted. No pulmonary venous congestion. 2. No focal infiltrate. Mild right base subsegmental atelectasis. No pleural effusion noted on today's exam. Electronically Signed   By: Marcello Moores  Register   On: 12/30/2020 05:57   DG CHEST PORT 1 VIEW  Result Date: 12/23/2020 CLINICAL DATA:  Chest pain. EXAM: PORTABLE CHEST 1 VIEW COMPARISON:  12/20/2020. FINDINGS: Cardiomegaly. No pulmonary venous congestion. Low lung volumes with mild bibasilar atelectasis. Tiny bilateral pleural effusions can not be excluded. No pneumothorax. IMPRESSION: 1.  Cardiomegaly.  No pulmonary venous congestion. 2. Low lung volumes with mild bibasilar atelectasis. Tiny bilateral pleural effusions cannot be excluded. Electronically Signed   By: Marcello Moores  Register   On: 12/23/2020 12:36   DG CHEST PORT 1 VIEW  Result Date: 12/20/2020 CLINICAL DATA:  Temporary pacemaker. EXAM: PORTABLE CHEST 1 VIEW  COMPARISON:  12/19/2020. FINDINGS: Trachea is midline. Heart is enlarged, stable. Thoracic aorta is calcified. Right IJ central line tip is in the brachiocephalic vein junction region. Transvenous pacemaker is seen from an IVC approach with tip projecting over the right ventricle. Defect related pads are in place. Lungs are somewhat low in volume with minimal bibasilar subsegmental atelectasis. No pleural fluid. IMPRESSION: Low lung volumes with bibasilar subsegmental atelectasis. Electronically Signed   By: Lorin Picket M.D.   On: 12/20/2020 10:00   DG Chest Port 1 View  Result Date: 12/19/2020 CLINICAL DATA:  Temporary cardiac pacemaker placement EXAM: PORTABLE CHEST 1 VIEW COMPARISON:  08/26/2015 FINDINGS: UPPER limits normal heart size again noted. A lead extending from the abdomen is noted with tip overlying the  mid-LEFT heart. A RIGHT IJ central venous catheter sheath is present with tip overlying the mid SVC. There is no evidence of focal airspace disease, pulmonary edema, suspicious pulmonary nodule/mass, pleural effusion, or pneumothorax. No acute bony abnormalities are identified. IMPRESSION: Lead extending from the abdomen overlying the mid-LEFT heart. No pneumothorax. RIGHT IJ central venous catheter sheath. Electronically Signed   By: Margarette Canada M.D.   On: 12/19/2020 11:33   DG C-Arm 1-60 Min  Result Date: 12/19/2020 CLINICAL DATA:  Verification of temporary pacing lead location. EXAM: DG C-ARM 1-60 MIN FLUOROSCOPY TIME:  Fluoroscopy Time:  179 seconds Radiation Exposure Index (if provided by the fluoroscopic device): 41.45 mGy Number of Acquired Spot Images: 1 COMPARISON:  Radiograph earlier this day. FINDINGS: Single fluoroscopic spot view of the lower chest is obtained. The right internal jugular central catheter on prior radiograph is not seen on this view. There multiple overlying monitoring devices. Presumed pacing lead from inferior projects over the expected location of the central/right heart. IMPRESSION: Single fluoroscopic spot view of the lower chest. Pacing lead is tentatively visualized projecting over the central/right heart. Electronically Signed   By: Keith Rake M.D.   On: 12/19/2020 22:15   ECHOCARDIOGRAM COMPLETE  Result Date: 12/19/2020    ECHOCARDIOGRAM LIMITED REPORT   Patient Name:   Colleen [name] Date of Exam: 12/19/2020 Medical Rec #:  [id number]        Height:       63.0 in Accession #:    [id number]       Weight:       205.0 lb Date of Birth:  09-Sep-1966        BSA:          1.954 m Patient Age:    14 years         BP:           116/76 mmHg Patient Gender: F                HR:           60 bpm. Exam Location:  Inpatient Procedure: 2D Echo and Intracardiac Opacification Agent Indications:     abnormal ecg  History:         Patient has no prior history of Echocardiogram  examinations.                  Arrythmias:complete AV block; Risk Factors:Diabetes and                  Hypertension.  Sonographer:     Johny Chess RDCS Referring Phys:  [id number] [facility] Aeon Kessner Diagnosing Phys: Vernell Leep MD IMPRESSIONS  1. Left ventricular ejection fraction, by estimation, is 55 to 60%.  The left ventricle has normal function. The left ventricle demonstrates regional wall motion abnormalities (see scoring diagram/findings for description). Left ventricular diastolic parameters are indeterminate. There is mild hypokinesis of the left ventricular, basal inferolateral wall.  2. Right ventricular systolic function is moderately reduced. The right ventricular size is moderately enlarged. Estimated PASP 33 mmHg.  3. Right atrial size was mildly dilated.  4. Tricuspid valve regurgitation is severe.  5. The inferior vena cava is normal in size with <50% respiratory variability, suggesting right atrial pressure of 8 mmHg.  6. Comapared to previous study report in 2018, LV/RV wall motion abnormalities are new. FINDINGS  Left Ventricle: Left ventricular ejection fraction, by estimation, is 55 to 60%. The left ventricle has normal function. The left ventricle demonstrates regional wall motion abnormalities. Mild hypokinesis of the left ventricular, basal inferolateral wall. Definity contrast agent was given IV to delineate the left ventricular endocardial borders. Abnormal (paradoxical) septal motion, consistent with RV pacemaker. Left ventricular diastolic parameters are indeterminate. Right Ventricle: The right ventricular size is moderately enlarged. No increase in right ventricular wall thickness. Right ventricular systolic function is moderately reduced. There is moderately elevated pulmonary artery systolic pressure. The tricuspid  regurgitant velocity is 2.39 m/s, and with an assumed right atrial pressure of 8 mmHg, the estimated right ventricular systolic pressure is 33.2 mmHg. Left  Atrium: Left atrial size was normal in size. Right Atrium: Right atrial size was mildly dilated. Pericardium: There is no evidence of pericardial effusion. Mitral Valve: The mitral valve is grossly normal. Tricuspid Valve: The tricuspid valve is grossly normal. Tricuspid valve regurgitation is severe. Aortic Valve: The aortic valve is grossly normal. Aortic valve regurgitation is not visualized. Pulmonic Valve: The pulmonic valve was grossly normal. Pulmonic valve regurgitation is not visualized. Aorta: The aortic root and ascending aorta are structurally normal, with no evidence of dilitation. Venous: The inferior vena cava is normal in size with less than 50% respiratory variability, suggesting right atrial pressure of 8 mmHg. Additional Comments: A device lead is visualized in the inferior vena cava. LEFT VENTRICLE PLAX 2D LVIDd:         4.40 cm LVIDs:         3.50 cm LV PW:         1.00 cm LV IVS:        1.00 cm LVOT diam:     1.60 cm LVOT Area:     2.01 cm  RIGHT VENTRICLE         IVC TAPSE (M-mode): 1.2 cm  IVC diam: 2.10 cm LEFT ATRIUM             Index       RIGHT ATRIUM           Index LA diam:        2.70 cm 1.38 cm/m  RA Area:     16.40 cm LA Vol (A2C):   38.6 ml 19.75 ml/m RA Volume:   47.30 ml  24.21 ml/m LA Vol (A4C):   37.5 ml 19.19 ml/m LA Biplane Vol: 40.2 ml 20.57 ml/m   AORTA Ao Root diam: 3.10 cm Ao Asc diam:  3.30 cm TRICUSPID VALVE TR Peak grad:   22.8 mmHg TR Vmax:        239.00 cm/s  SHUNTS Systemic Diam: 1.60 cm Vernell Leep MD Electronically signed by Vernell Leep MD Signature Date/Time: 12/19/2020/4:19:20 PM    Final    ECHOCARDIOGRAM LIMITED  Result Date: 01/02/2021    ECHOCARDIOGRAM LIMITED  REPORT   Patient Name:   Colleen [name] Date of Exam: 01/02/2021 Medical Rec #:  [id number]        Height:       64.0 in Accession #:    [id number]       Weight:       216.3 lb Date of Birth:  09-20-1966        BSA:          2.022 m Patient Age:    19 years         BP:            129/69 mmHg Patient Gender: F                HR:           103 bpm. Exam Location:  Inpatient Procedure: Limited Echo, Cardiac Doppler, Color Doppler and Intracardiac            Opacification Agent Indications:     Cardiogenic shock  History:         Patient has prior history of Echocardiogram examinations, most                  recent 12/23/2020. CAD, Arrythmias:AV block,                  Signs/Symptoms:Syncope; Risk Factors:Hypertension, Diabetes and                  Morbid obesity. Cardiogenic shock.  Sonographer:     Dustin Flock RDCS Referring Phys:  [id number] [facility] Pickle Diagnosing Phys: Rex Kras DO IMPRESSIONS  1. LIMITED ECHOCARDIOGRAM; Full Study dated 12/19/2020.  2. Left ventricular ejection fraction, by estimation, is 50 to 55%. The left ventricle has low normal function. Diastolic function not assessed due to limited study.  3. Right ventricular systolic function is mildly reduced. The right ventricular size is mildly enlarged. There is mildly elevated pulmonary artery systolic pressure. The estimated right ventricular systolic pressure is 40.1 mmHg.  4. Left atrial size was grossly normal in size.  5. The pericardial effusion is posterior to the left ventricle.  6. The mitral valve is grossly normal. No evidence of mitral valve regurgitation. No evidence of mitral stenosis.  7. The aortic valve is grossly normal.  8. The inferior vena cava is dilated in size with >50% respiratory variability, suggesting right atrial pressure of 8 mmHg. Comparison(s): Prior study dated 12/23/2020: LVEF 50-55%, RV size moderately enlarged and function moderately reduced, PASP 33mmHg, [place] lead seen in RV, Severe TR, RAP 44mmHg. Conclusion(s)/Recommendation(s): No left ventricular mural or apical thrombus/thrombi. FINDINGS  Left Ventricle: Mild hypokinesis of the basal inferolateral wall. Left ventricular ejection fraction, by estimation, is 50 to 55%. The left ventricle has low normal function. Definity  contrast agent was given IV to delineate the left ventricular endocardial borders. There is no left ventricular hypertrophy. Diastolic function not assessed due to limited study. Right Ventricle: Device lead is visualized in the inferior vena and right sided cardiac chambers. The right ventricular size is mildly enlarged. Right vetricular wall thickness was not well visualized. Right ventricular systolic function is mildly reduced. There is mildly elevated pulmonary artery systolic pressure. The tricuspid regurgitant velocity is 2.66 m/s, and with an assumed right atrial pressure of 8 mmHg, the estimated right ventricular systolic pressure is 02.7 mmHg. Left Atrium: Left atrial size was grossly normal in size. Right Atrium: Right atrial size was not well visualized. Pericardium: Trivial pericardial effusion is  present. The pericardial effusion is posterior to the left ventricle. Mitral Valve: The mitral valve is grossly normal. Mild mitral annular calcification. No evidence of mitral valve stenosis. Tricuspid Valve: The tricuspid valve is grossly normal. Tricuspid valve regurgitation is mild . No evidence of tricuspid stenosis. Aortic Valve: The aortic valve is grossly normal. Pulmonic Valve: The pulmonic valve was not well visualized. Aorta: The aortic root is normal in size and structure. Venous: The inferior vena cava is dilated in size with greater than 50% respiratory variability, suggesting right atrial pressure of 8 mmHg. IAS/Shunts: The interatrial septum was not well visualized. LEFT VENTRICLE PLAX 2D LVIDd:         4.80 cm     Diastology LVIDs:         3.00 cm     LV e' medial:    6.96 cm/s LV PW:         1.10 cm     LV E/e' medial:  14.0 LV IVS:        0.90 cm     LV e' lateral:   7.18 cm/s                            LV E/e' lateral: 13.6  LV Volumes (MOD) LV vol d, MOD A4C: 79.0 ml LV vol s, MOD A4C: 39.8 ml LV SV MOD A4C:     79.0 ml RIGHT VENTRICLE RV Basal diam:  3.40 cm RV S prime:     6.96 cm/s  TAPSE (M-mode): 2.0 cm LEFT ATRIUM         Index LA diam:    2.80 cm 1.38 cm/m   AORTA Ao Root diam: 2.90 cm MITRAL VALVE                TRICUSPID VALVE MV Area (PHT): 7.99 cm     TR Peak grad:   28.3 mmHg MV Decel Time: 95 msec      TR Vmax:        266.00 cm/s MV E velocity: 97.40 cm/s MV A velocity: 115.00 cm/s MV E/A ratio:  0.85 Sunit Tolia DO Electronically signed by Rex Kras DO Signature Date/Time: 01/02/2021/2:28:55 PM    Final    ECHOCARDIOGRAM LIMITED  Result Date: 12/23/2020    ECHOCARDIOGRAM LIMITED REPORT   Patient Name:   Colleen [name] Date of Exam: 12/23/2020 Medical Rec #:  [id number]        Height:       63.0 in Accession #:    [id number]       Weight:       216.0 lb Date of Birth:  21-Oct-1965        BSA:          1.998 m Patient Age:    27 years         BP:           109/62 mmHg Patient Gender: F                HR:           50 bpm. Exam Location:  Inpatient Procedure: Limited Echo, Limited Color Doppler, Cardiac Doppler and Intracardiac            Opacification Agent Indications:    Acute myocardial infarction, unspecified I21.9  History:        Patient has prior history of Echocardiogram examinations, most  recent 12/19/2020. Risk Factors:Diabetes.  Sonographer:    Bernadene Person RDCS Referring Phys: [id number] Crane  1. Abnormal septal wall motion due to right ventricular pacing. Left ventricular ejection fraction, by estimation, is 50 to 55%. The left ventricle has low normal function.  2. Right ventricular systolic function is moderately reduced. The right ventricular size is moderately enlarged. There is normal pulmonary artery systolic pressure. Estimated PASP 21 mmHg.  3. Pacemaker lead seen in right ventricle.  4. Tricuspid valve regurgitation is severe.  5. The inferior vena cava is normal in size with <50% respiratory variability, suggesting right atrial pressure of 8 mmHg.  6. Compared to previous study on 12/19/2020, RV pacing is new.  FINDINGS  Left Ventricle: Abnormal septal wall motion due to right ventricular pacing. Left ventricular ejection fraction, by estimation, is 50 to 55%. The left ventricle has low normal function. Definity contrast agent was given IV to delineate the left ventricular endocardial borders. Right Ventricle: The right ventricular size is moderately enlarged. Right vetricular wall thickness was not assessed. Right ventricular systolic function is moderately reduced. There is normal pulmonary artery systolic pressure. The tricuspid regurgitant  velocity is 2.10 m/s, and with an assumed right atrial pressure of 8 mmHg, the estimated right ventricular systolic pressure is 94.1 mmHg. Pericardium: There is no evidence of pericardial effusion. Tricuspid Valve: Tricuspid valve regurgitation is severe. Venous: The inferior vena cava is normal in size with less than 50% respiratory variability, suggesting right atrial pressure of 8 mmHg. LEFT VENTRICLE PLAX 2D LVIDd:         4.30 cm LVIDs:         3.20 cm LV PW:         1.00 cm LV IVS:        0.90 cm LVOT diam:     1.90 cm LV SV:         34 LV SV Index:   17 LVOT Area:     2.84 cm  RIGHT VENTRICLE TAPSE (M-mode): 1.2 cm LEFT ATRIUM         Index LA diam:    3.00 cm 1.50 cm/m  AORTIC VALVE LVOT Vmax:   70.50 cm/s LVOT Vmean:  48.700 cm/s LVOT VTI:    0.122 m  AORTA Ao Root diam: 3.20 cm TRICUSPID VALVE TR Peak grad:   17.6 mmHg TR Vmax:        210.00 cm/s  SHUNTS Systemic VTI:  0.12 m Systemic Diam: 1.90 cm Regena Delucchi MD Electronically signed by Vernell Leep MD Signature Date/Time: 12/23/2020/5:01:33 PM    Final       FOLLOW UP PLANS AND APPOINTMENTS Discharge Instructions    Call MD for:  redness, tenderness, or signs of infection (pain, swelling, redness, odor or green/yellow discharge around incision site)   Complete by: As directed    Diet - low sodium heart healthy   Complete by: As directed    Increase activity slowly   Complete by: As directed     Leave dressing on - Keep it clean, dry, and intact until clinic visit   Complete by: As directed      Allergies as of 01/10/2021      Reactions   Other Other (See Comments)   Succinylcholine Other (See Comments)   Pseudocholinesterase deficiency      Medication List    STOP taking these medications   clopidogrel 75 MG tablet Commonly known as: PLAVIX   Eliquis 5 MG Tabs tablet Generic  drug: apixaban   ferrous sulfate 325 (65 FE) MG tablet     TAKE these medications   albuterol 108 (90 Base) MCG/ACT inhaler Commonly known as: VENTOLIN HFA Inhale 1-2 puffs into the lungs every 6 (six) hours as needed for wheezing or shortness of breath.   Aspirin Low Dose 81 MG EC tablet Generic drug: aspirin Take 1 tablet (81 mg total) by mouth daily. Swallow whole.   insulin lispro 100 UNIT/ML injection Commonly known as: HUMALOG Inject into the skin See admin instructions. Insulin Pump - Mini Medtronic 670G   loratadine 10 MG tablet Commonly known as: CLARITIN Take 1 tablet by mouth daily as needed for allergies.   metoprolol succinate 25 MG 24 hr tablet Commonly known as: Toprol XL Take 1/2 tablet (12.5 mg total) by mouth daily.   nitroGLYCERIN 0.4 MG SL tablet Commonly known as: NITROSTAT Place 1 tablet (0.4 mg total) under the tongue every 5 (five) minutes x 3 doses as needed for chest pain.   rosuvastatin 10 MG tablet Commonly known as: CRESTOR Take 10 mg by mouth at bedtime.   sacubitril-valsartan 24-26 MG Commonly known as: ENTRESTO Take 0.5 tablets by mouth 2 (two) times daily.   tobramycin 0.3 % ophthalmic solution Commonly known as: TOBREX Place 2 drops into both eyes as needed (prior to injections).   torsemide 20 MG tablet Commonly known as: DEMADEX Take 1 tablet (20 mg total) by mouth in the morning as directed. May take 1 additional tab in the afternoon if there is leg swelling (Take 1 tablet (20 mg total) by mouth as directed. One tab in the  morning Additional 1 tab in the afternoon if there is leg swelling) What changed:   when to take this  reasons to take this  additional instructions   Vitamin D 50 MCG (2000 UT) tablet Take 2,000 Units by mouth daily.            Discharge Care Instructions  (From admission, onward)         Start     Ordered   01/10/21 0000  Leave dressing on - Keep it clean, dry, and intact until clinic visit        01/10/21 [place] [facility] Follow up.   Specialty: Cardiology Why: 01/18/21 @ 8:40AM, wound check visit Contact information: [address], [address] [place]       Vickie Epley, MD Follow up.   Specialties: Cardiology, Radiology Why: 04/22/21 @ 2:00PM Contact information: [address] [place] [address] [phone]        Alethia Berthold, PA-C Follow up on 01/14/2021.   Specialty: Cardiology Why: 10:00 AM Contact information: [place] [address] [phone]        Orpah Melter, MD. Daphane Shepherd on 01/14/2021.   Specialty: Family Medicine Why: @3 :30 Contact information: [place] [address] [phone]                 Nigel Mormon, MD Pager: [phone] Office: [phone]

## 2021-01-10 NOTE — Plan of Care (Signed)

## 2021-01-13 ENCOUNTER — Telehealth (HOSPITAL_COMMUNITY): Payer: Self-pay

## 2021-01-13 LAB — BASIC METABOLIC PANEL
BUN/Creatinine Ratio: 30 — ABNORMAL HIGH (ref 9–23)
BUN: 43 mg/dL — ABNORMAL HIGH (ref 6–24)
CO2: 25 mmol/L (ref 20–29)
Calcium: 8.6 mg/dL — ABNORMAL LOW (ref 8.7–10.2)
Chloride: 95 mmol/L — ABNORMAL LOW (ref 96–106)
Creatinine, Ser: 1.43 mg/dL — ABNORMAL HIGH (ref 0.57–1.00)
Glucose: 126 mg/dL — ABNORMAL HIGH (ref 65–99)
Potassium: 4.8 mmol/L (ref 3.5–5.2)
Sodium: 134 mmol/L (ref 134–144)
eGFR: 43 mL/min/{1.73_m2} — ABNORMAL LOW (ref >59)

## 2021-01-13 NOTE — Telephone Encounter (Signed)
Attempted to call patient in regards to Cardiac Rehab - LM on VM 

## 2021-01-13 NOTE — Telephone Encounter (Signed)
Pt insurance is active and benefits verified through [facility]. Co-pay $0.00, DED $1,000.00/$1,000.00 met, out of pocket $2,500.00/$2,500.00 met, co-insurance 10%. No pre-authorization required. Passport, 01/13/21 @ 11:24PM, [id number]  Will contact patient to see if she is interested in the Cardiac Rehab Program. If interested, patient will need to complete follow up appt. Once completed, patient will be contacted for scheduling upon review by the RN Navigator.

## 2021-01-14 ENCOUNTER — Other Ambulatory Visit: Payer: Self-pay

## 2021-01-14 ENCOUNTER — Encounter: Payer: Self-pay | Admitting: Cardiology

## 2021-01-14 ENCOUNTER — Ambulatory Visit: Payer: 59 | Admitting: Cardiology

## 2021-01-14 VITALS — BP 136/65 | HR 64 | Temp 98.2°F | Resp 16 | Ht 63.0 in | Wt 222.0 lb

## 2021-01-14 DIAGNOSIS — I252 Old myocardial infarction: Secondary | ICD-10-CM

## 2021-01-14 DIAGNOSIS — I2510 Atherosclerotic heart disease of native coronary artery without angina pectoris: Secondary | ICD-10-CM

## 2021-01-14 DIAGNOSIS — I442 Atrioventricular block, complete: Secondary | ICD-10-CM

## 2021-01-14 DIAGNOSIS — I50813 Acute on chronic right heart failure: Secondary | ICD-10-CM

## 2021-01-14 DIAGNOSIS — D649 Anemia, unspecified: Secondary | ICD-10-CM

## 2021-01-14 DIAGNOSIS — I50812 Chronic right heart failure: Secondary | ICD-10-CM | POA: Insufficient documentation

## 2021-01-14 MED ORDER — TICAGRELOR 60 MG PO TABS: 90.0000 mg | ORAL_TABLET | Freq: Two times a day (BID) | ORAL | Status: DC

## 2021-01-14 MED ORDER — TORSEMIDE 20 MG PO TABS: 20.0000 mg | ORAL_TABLET | Freq: Two times a day (BID) | ORAL | 1 refills | Status: DC | PRN

## 2021-01-14 MED ORDER — DAPAGLIFLOZIN PROPANEDIOL 10 MG PO TABS: 10.0000 mg | ORAL_TABLET | Freq: Every day | ORAL | 2 refills | Status: DC

## 2021-01-14 NOTE — Progress Notes (Signed)
Follow up visit  Subjective:   Colleen [name], female    DOB: [birth year], 55 y.o.   MRN: [id number]   HPI   Chief Complaint  Patient presents with  . Coronary Artery Disease  . Code STEMI  . Hospitalization Follow-up    55 y.o. Caucasian female  with hypertension, type 1 diabetes mellitus, CAD, late presentation inferior MI (12/9447), complicated by complete AV block and AKI/CKD, RV failure, resolution of AV block and AKI on discharge, followed by repeat admission with asystole cardiac arrest (12/2020), now s/p PPM  Patient is here with her husband today.  Since hospital discharge on 01/10/2021, she has only gained 2 pounds, but has had significant worsening of her leg swelling.  She has started walking short distances at home, using a cane given her neuropathy.  She reports feeling tired, but denies any chest pain.  She is compliant with her medical therapy.  Blood pressure is normal for the most part, except at night it gets high 80s/50s at times.  She has only occasional lightheadedness, but denies any presyncope or syncope.  Pacemaker site has Steri-Strips on.  She has had occasional sharp pain at the site of pacemaker.   Discharge summary 01/10/2021: 55 y.o. Caucasian female  with hypertension, type 1 diabetes mellitus, recent history of acute myocardial infarction secondary to ostial RCA occlusion, medically managed due to late presentation acute kidney injury, complicated by complete AV block resolved on discharge.  Patient was discharged home on 12/30/2020 after thorough evaluation by electrophysiology and confirmation of improvement in AV conduction, not requiring pacemaker.  However, since going home, patient had worsening lightheadedness and episode of cardiac arrest.  She was brought to the emergency room underwent CPR and intubation.  She was taken emergently to Cath Lab given her complete AV block (EKG not available on chart), and underwent emergency temporary pacemaker  placement by Dr. Martinique.  Patient also acute kidney injury with creatinine as high as 3.3.  Through the hospital course, patient remarkably improved, was successfully extubated.  She was treated for possible pneumonia with IV antibiotics.  After repeat blood cultures were negative, she then underwent permanent pacemaker placement.  Troponin was mildly elevated through the hospital course, attributed to be type II myocardial infarction.  Given no recurrence of atrial fibrillation, however Eliquis was discontinued.  Plavix was held given recent pacemaker placement, and will be resumed on follow-up.  She was diuresed net -8L with IV Lasix.  On 01/09/2021, [name] was reintroduced given her right-sided heart failure.  Her creatinine did increase from 1.3-1.68.  However, she put out 3 L of urine.  I suspect increase in creatinine was owing to overdiuresis as well as initiation of Entresto.  On discharge, I discontinued IV Lasix placed patient on low-dose diuretics of p.o. torsemide 20 mg daily.  Continued Entresto 24-26 mg twice daily, as well as metoprolol succinate 12.5 mg daily for medical management of right-sided heart failure.  Follow-up outpatient level was arranged for 01/12/2021, followed by outpatient follow-up on 01/14/2021.  Physical therapy, Occupational Therapy, and cardiac rehab evaluated the patient prior to discharge.      Current Outpatient Medications on File Prior to Visit  Medication Sig Dispense Refill  . albuterol (PROVENTIL HFA;VENTOLIN HFA) 108 (90 Base) MCG/ACT inhaler Inhale 1-2 puffs into the lungs every 6 (six) hours as needed for wheezing or shortness of breath.    Marland Kitchen aspirin 81 MG EC tablet Take 1 tablet (81 mg total) by mouth daily. Swallow whole.  30 tablet 1  . Cholecalciferol (VITAMIN D) 2000 units tablet Take 2,000 Units by mouth daily.    Marland Kitchen docusate sodium (COLACE) 100 MG capsule Take 100 mg by mouth 2 (two) times daily.    . insulin lispro (HUMALOG) 100 UNIT/ML  injection Inject into the skin See admin instructions. Insulin Pump - Mini Medtronic 670G    . loratadine (CLARITIN) 10 MG tablet Take 1 tablet by mouth daily as needed for allergies.    . metoprolol succinate (TOPROL XL) 25 MG 24 hr tablet Take 1/2 tablet (12.5 mg total) by mouth daily. 30 tablet 3  . nitroGLYCERIN (NITROSTAT) 0.4 MG SL tablet Place 1 tablet (0.4 mg total) under the tongue every 5 (five) minutes x 3 doses as needed for chest pain. 25 tablet 1  . rosuvastatin (CRESTOR) 10 MG tablet Take 10 mg by mouth at bedtime.    . sacubitril-valsartan (ENTRESTO) 24-26 MG Take 0.5 tablets by mouth 2 (two) times daily. (Patient taking differently: Take 1 tablet by mouth 2 (two) times daily.) 60 tablet 1  . tobramycin (TOBREX) 0.3 % ophthalmic solution Place 2 drops into both eyes as needed (prior to injections).     . torsemide (DEMADEX) 20 MG tablet Take 1 tablet (20 mg total) by mouth as directed. One tab in the morning Additional 1 tab in the afternoon if there is leg swelling (Patient taking differently: Take 20 mg by mouth 2 (two) times daily as needed (leg swelling).) 60 tablet 1   No current facility-administered medications on file prior to visit.    Cardiovascular & other pertient studies:  EKG 01/14/2021: Sinus rhythm 66 bpm First degree A-V block  Low voltage in precordial leads.  Inferior infarct -age undetermined   Recent labs: 01/12/2021: Glucose 126, BUN/Cr 43/1.43. EGFR 30. Na/K 134/4.8.  H/H 9.2/28.7. MCV 94. Platelets 199 HbA1C 7.5% Chol 118, TG 210, HDL 42, LDL 34  01/02/2021: AST/ALT 45/81 T.bili 1.4 AlKP 153  11/2020: TSH 1.8 normal   Review of Systems  Cardiovascular: Positive for dyspnea on exertion and leg swelling. Negative for chest pain, palpitations and syncope.         Vitals:   01/14/21 1004  BP: 136/65  Pulse: 64  Resp: 16  Temp: 98.2 F (36.8 C)  SpO2: 100%     Body mass index is 39.33 kg/m. Filed Weights   01/14/21 1004   Weight: 222 lb (100.7 kg)     Objective:   Physical Exam Vitals and nursing note reviewed.  Constitutional:      General: She is not in acute distress. Neck:     Vascular: No JVD.  Cardiovascular:     Rate and Rhythm: Normal rate and regular rhythm.     Heart sounds: Normal heart sounds. No murmur heard.   Pulmonary:     Effort: Pulmonary effort is normal.     Breath sounds: Normal breath sounds. No wheezing or rales.  Musculoskeletal:     Right lower leg: Edema (2+) present.     Left lower leg: Edema (2+) present.           Assessment & Recommendations:   55 y.o. Caucasian female  with hypertension, type 1 diabetes mellitus, CAD, late presentation inferior MI (10/8411), complicated by complete AV block and AKI/CKD, RV failure, resolution of AV block and AKI on discharge, followed by repeat admission with asystole cardiac arrest (12/2020), now s/p PPM, anemia  CAD: Late presentation inferior STEMI with occluded RCA.  She also has  moderate proximal/mid LAD disease, and left circumflex CTO. Currently, she has no angina symptoms.  Continue aggressive medical management. Continue aspirin 81 mg daily.  I will check with EP if it would be okay to resume Brilinta, given pacemaker implant on 01/06/2021.  I have given her samples, to be started after confirmation with EP. Continue metoprolol succinate 12.5 mg daily, Crestor 10 mg daily. After adequate treatment of her RV failure, I will consider performing stress test to evaluate for ischemia.  Right-sided heart failure: Acute on chronic Secondary to completed RV infarct. Increase torsemide 20 mg daily to 20 mg twice daily. Also added Farxiga 10 mg daily. Recommend daily weight checks.  Repeat BMP, BNP in 7-10 days.  Complete AV block: Intermittent, now s/p PPM Currently in sinus rhythm with first-degree AV block. No hematoma at pacemaker site.  Wound check on 01/18/2021.  CKD 3b: She has tolerated 2 episodes of acute kidney  injury after her recent hospitalizations.  Fortunately, she is recovered fairly well even after creatinine elevation to as high as 5.2. Currently, GFR 43, creatinine 1.43. She has upcoming follow-up with Dr. Hollie Salk next week.  Type 1 DM: Continue insulin pump.  Follow-up with Dr. Chalmers Cater Given her recent CAD and heart failure, I have added Farxiga 10 mg daily.  Anemia: Likely anemia of chronic disease, improving Repeat CBC in 1 week    Nigel Mormon, MD Pager: [phone] Office: [phone]

## 2021-01-18 ENCOUNTER — Other Ambulatory Visit: Payer: Self-pay

## 2021-01-18 ENCOUNTER — Ambulatory Visit (INDEPENDENT_AMBULATORY_CARE_PROVIDER_SITE_OTHER): Payer: 59 | Admitting: Emergency Medicine

## 2021-01-18 DIAGNOSIS — I4430 Unspecified atrioventricular block: Secondary | ICD-10-CM

## 2021-01-18 LAB — CUP PACEART INCLINIC DEVICE CHECK
Battery Remaining Longevity: 128 mo
Battery Voltage: 3.16 V
Brady Statistic RA Percent Paced: 6 %
Brady Statistic RV Percent Paced: 13 %
Date Time Interrogation Session: 20220426103624
Implantable Lead Implant Date: 20220414
Implantable Lead Implant Date: 20220414
Implantable Lead Location: 753859
Implantable Lead Location: 753860
Implantable Lead Model: 1944
Implantable Pulse Generator Implant Date: 20220414
Lead Channel Impedance Value: 562.5 Ohm
Lead Channel Impedance Value: 587.5 Ohm
Lead Channel Pacing Threshold Amplitude: 0.75 V
Lead Channel Pacing Threshold Amplitude: 1.75 V
Lead Channel Pacing Threshold Pulse Width: 0.5 ms
Lead Channel Pacing Threshold Pulse Width: 0.8 ms
Lead Channel Sensing Intrinsic Amplitude: 12 mV
Lead Channel Sensing Intrinsic Amplitude: 2.5 mV
Lead Channel Setting Pacing Amplitude: 3.5 V
Lead Channel Setting Pacing Amplitude: 3.5 V
Lead Channel Setting Pacing Pulse Width: 0.5 ms
Lead Channel Setting Sensing Sensitivity: 2 mV
Pulse Gen Model: 2272
Pulse Gen Serial Number: 3920245

## 2021-01-18 NOTE — Progress Notes (Signed)
Wound check appointment. Steri-strips removed. Wound without redness or edema. Incision edges approximated, wound well healed. Normal device function. RV Thresholds, P/R wave sensing, and impedances consistent with implant measurements.  RA thresholds noted elevated 2.25 @0 .5 or 1.75V @ 0.10ms, reviewed results with Dr. Quentin Ore who ordered reprogramming of RA lead output to 3.5v@ 0.68ms.  Device programmed at 3.5V for extra safety margin until 3 month visit. Histogram distribution appropriate for patient and level of activity. No mode switches or high ventricular rates noted. Patient educated about wound care, arm mobility, lifting restrictions. Patient is enrolled in remote monitoring, next scheduled check 04/08/21.  ROV with Dr. Quentin Ore 04/22/21.

## 2021-01-27 ENCOUNTER — Encounter: Payer: Self-pay | Admitting: Cardiology

## 2021-01-27 ENCOUNTER — Other Ambulatory Visit: Payer: Self-pay

## 2021-01-27 ENCOUNTER — Ambulatory Visit: Payer: 59 | Admitting: Cardiology

## 2021-01-27 ENCOUNTER — Telehealth (HOSPITAL_COMMUNITY): Payer: Self-pay

## 2021-01-27 VITALS — BP 131/71 | HR 68 | Temp 98.0°F | Resp 16 | Ht 63.0 in | Wt 205.0 lb

## 2021-01-27 DIAGNOSIS — I252 Old myocardial infarction: Secondary | ICD-10-CM

## 2021-01-27 DIAGNOSIS — I50812 Chronic right heart failure: Secondary | ICD-10-CM

## 2021-01-27 DIAGNOSIS — I442 Atrioventricular block, complete: Secondary | ICD-10-CM

## 2021-01-27 DIAGNOSIS — I2510 Atherosclerotic heart disease of native coronary artery without angina pectoris: Secondary | ICD-10-CM

## 2021-01-27 DIAGNOSIS — I50813 Acute on chronic right heart failure: Secondary | ICD-10-CM | POA: Insufficient documentation

## 2021-01-27 HISTORY — DX: Acute on chronic right heart failure: I50.813

## 2021-01-27 MED ORDER — TICAGRELOR 90 MG PO TABS: 90.0000 mg | ORAL_TABLET | Freq: Two times a day (BID) | ORAL | 3 refills | Status: DC

## 2021-01-27 MED ORDER — METOPROLOL SUCCINATE ER 25 MG PO TB24: 12.5000 mg | ORAL_TABLET | Freq: Every day | ORAL | 3 refills | Status: DC

## 2021-01-27 MED ORDER — LOSARTAN POTASSIUM 25 MG PO TABS
12.5000 mg | ORAL_TABLET | Freq: Every day | ORAL | 3 refills | Status: DC
Start: 2021-01-27 — End: 2021-02-05

## 2021-01-27 NOTE — Progress Notes (Signed)
Follow up visit  Subjective:   Colleen [name], female    DOB: 12-Nov-1965, 55 y.o.   MRN: [id number]   HPI   Chief Complaint  Patient presents with  . Coronary Artery Disease  . Follow-up    2 week  . Medication Problem    55 y.o. Caucasian female  with hypertension, type 1 diabetes mellitus, CAD, late presentation inferior MI (12/6801), complicated by complete AV block and AKI/CKD, RV failure, resolution of AV block and AKI on discharge, followed by repeat admission with asystole cardiac arrest (12/2020), now s/p PPM  Colleen [name] was rightly stopped by Dr. Hollie Salk and Dr. Chalmers Cater, given her type 1, and not type 2 DM. Patient is doing remarkably better with resolution of leg edema. She is nly taking torsemide as needed. She still has phases of hypotension with SBP as low as 90 mmHg.   Discharge summary 01/10/2021: 55 y.o. Caucasian female  with hypertension, type 1 diabetes mellitus, recent history of acute myocardial infarction secondary to ostial RCA occlusion, medically managed due to late presentation acute kidney injury, complicated by complete AV block resolved on discharge.  Patient was discharged home on 12/30/2020 after thorough evaluation by electrophysiology and confirmation of improvement in AV conduction, not requiring pacemaker.  However, since going home, patient had worsening lightheadedness and episode of cardiac arrest.  She was brought to the emergency room underwent CPR and intubation.  She was taken emergently to Cath Lab given her complete AV block (EKG not available on chart), and underwent emergency temporary pacemaker placement by Dr. Martinique.  Patient also acute kidney injury with creatinine as high as 3.3.  Through the hospital course, patient remarkably improved, was successfully extubated.  She was treated for possible pneumonia with IV antibiotics.  After repeat blood cultures were negative, she then underwent permanent pacemaker placement.  Troponin was mildly  elevated through the hospital course, attributed to be type II myocardial infarction.  Given no recurrence of atrial fibrillation, however Eliquis was discontinued.  Plavix was held given recent pacemaker placement, and will be resumed on follow-up.  She was diuresed net -8L with IV Lasix.  On 01/09/2021, Colleen [name] was reintroduced given her right-sided heart failure.  Her creatinine did increase from 1.3-1.68.  However, she put out 3 L of urine.  I suspect increase in creatinine was owing to overdiuresis as well as initiation of Entresto.  On discharge, I discontinued IV Lasix placed patient on low-dose diuretics of p.o. torsemide 20 mg daily.  Continued Entresto 24-26 mg twice daily, as well as metoprolol succinate 12.5 mg daily for medical management of right-sided heart failure.  Follow-up outpatient level was arranged for 01/12/2021, followed by outpatient follow-up on 01/14/2021.  Physical therapy, Occupational Therapy, and cardiac rehab evaluated the patient prior to discharge.      Current Outpatient Medications on File Prior to Visit  Medication Sig Dispense Refill  . albuterol (PROVENTIL HFA;VENTOLIN HFA) 108 (90 Base) MCG/ACT inhaler Inhale 1-2 puffs into the lungs every 6 (six) hours as needed for wheezing or shortness of breath.    Marland Kitchen aspirin 81 MG EC tablet Take 1 tablet (81 mg total) by mouth daily. Swallow whole. 30 tablet 1  . Cholecalciferol (VITAMIN D) 2000 units tablet Take 2,000 Units by mouth daily.    . insulin lispro (HUMALOG) 100 UNIT/ML injection Inject into the skin See admin instructions. Insulin Pump - Mini Medtronic 670G    . loratadine (CLARITIN) 10 MG tablet Take 1 tablet by mouth daily as  needed for allergies.    . metoprolol succinate (TOPROL XL) 25 MG 24 hr tablet Take 1/2 tablet (12.5 mg total) by mouth daily. 30 tablet 3  . nitroGLYCERIN (NITROSTAT) 0.4 MG SL tablet Place 1 tablet (0.4 mg total) under the tongue every 5 (five) minutes x 3 doses as needed for chest  pain. 25 tablet 1  . rosuvastatin (CRESTOR) 10 MG tablet Take 10 mg by mouth at bedtime.    . sacubitril-valsartan (ENTRESTO) 24-26 MG Take 0.5 tablets by mouth 2 (two) times daily. (Patient taking differently: Take 1 tablet by mouth 2 (two) times daily.) 60 tablet 1  . tobramycin (TOBREX) 0.3 % ophthalmic solution Place 2 drops into both eyes as needed (prior to injections).     . torsemide (DEMADEX) 20 MG tablet Take 1 tablet (20 mg total) by mouth 2 (two) times daily as needed (leg swelling). 60 tablet 1   Current Facility-Administered Medications on File Prior to Visit  Medication Dose Route Frequency Provider Last Rate Last Admin  . ticagrelor (BRILINTA) tablet 90 mg  90 mg Oral BID Colleen [name], Reynold Bowen, MD        Cardiovascular & other pertient studies:  EKG 01/14/2021: Sinus rhythm 66 bpm First degree A-V block  Low voltage in precordial leads.  Inferior infarct -age undetermined   Recent labs: 01/12/2021: Glucose 126, BUN/Cr 43/1.43. EGFR 30. Na/K 134/4.8.  H/H 9.2/28.7. MCV 94. Platelets 199 HbA1C 7.5% Chol 118, TG 210, HDL 42, LDL 34  01/02/2021: AST/ALT 45/81 T.bili 1.4 AlKP 153  11/2020: TSH 1.8 normal   Review of Systems  Cardiovascular: Positive for dyspnea on exertion and leg swelling. Negative for chest pain, palpitations and syncope.         Vitals:   01/27/21 0931  BP: 131/71  Pulse: 68  Resp: 16  Temp: 98 F (36.7 C)  SpO2: 98%     Body mass index is 36.31 kg/m. Filed Weights   01/27/21 0931  Weight: 205 lb (93 kg)     Objective:   Physical Exam Vitals and nursing note reviewed.  Constitutional:      General: She is not in acute distress. Neck:     Vascular: No JVD.  Cardiovascular:     Rate and Rhythm: Normal rate and regular rhythm.     Heart sounds: Normal heart sounds. No murmur heard.   Pulmonary:     Effort: Pulmonary effort is normal.     Breath sounds: Normal breath sounds. No wheezing or rales.  Musculoskeletal:      Right lower leg: Edema (2+) present.     Left lower leg: Edema (2+) present.           Assessment & Recommendations:   55 y.o. Caucasian female  with hypertension, type 1 diabetes mellitus, CAD, late presentation inferior MI (12/8544), complicated by complete AV block and AKI/CKD, RV failure, resolution of AV block and AKI on discharge, followed by repeat admission with asystole cardiac arrest (12/2020), now s/p PPM, anemia  CAD: Late presentation inferior STEMI with occluded RCA.  She also has moderate proximal/mid LAD disease, and left circumflex CTO. Currently, she has no angina symptoms.  Continue aggressive medical management. Continue aspirin 81 mg daily. Start Brilinta 90 mg bid, after loading dose of 180 mg. Continue metoprolol succinate 12.5 mg daily, Crestor 10 mg daily. Will obtain lexiscan nuclear stress test to evaluate for residual ischemia.  Right-sided heart failure: Acute decompensation resolved. Secondary to completed RV infarct. Continue torsemide 20 mg  as neede Colleen [name] is stopped given type 1 DM Recommend daily weight checks.  Repeat BMP in 7-10 days. Will repeat echocardiogram in June 2022.  Complete AV block: Intermittent, now s/p PPM Currently in sinus rhythm with first-degree AV block. No hematoma at pacemaker site.  Wound check on 01/18/2021.  CKD 3b: She has tolerated 2 episodes of acute kidney injury after her recent hospitalizations.  Fortunately, she is recovered fairly well even after creatinine elevation to as high as 5.2. Currently, Cr 1.4-1.6. Continue f/u w/ Dr. Hollie Salk.  Type 1 DM: Continue insulin pump.  Follow-up with Dr. Chalmers Cater  Anemia: Likely anemia of chronic disease, improving  F/u in 4 weeks   Nigel Mormon, MD Pager: [phone] Office: [phone]

## 2021-01-27 NOTE — Telephone Encounter (Signed)
Pt called and stated that she wanted to schedule for cardiac rehab, advised pt that we have a backlog and right now are like 2-4 months out possibly sooner. Also advised pt that her f/u note was not complete at this time. Pt stated that she will talk to Dr. Virgina Jock about it.

## 2021-01-27 NOTE — Telephone Encounter (Signed)
From pt

## 2021-02-01 ENCOUNTER — Other Ambulatory Visit: Payer: Self-pay

## 2021-02-04 NOTE — Telephone Encounter (Signed)
From patient.

## 2021-02-05 ENCOUNTER — Other Ambulatory Visit: Payer: Self-pay | Admitting: Cardiology

## 2021-02-05 DIAGNOSIS — I50812 Chronic right heart failure: Secondary | ICD-10-CM

## 2021-02-05 DIAGNOSIS — I10 Essential (primary) hypertension: Secondary | ICD-10-CM

## 2021-02-05 MED ORDER — LOSARTAN POTASSIUM 25 MG PO TABS: 25.0000 mg | ORAL_TABLET | Freq: Every day | ORAL | 3 refills | Status: DC

## 2021-02-07 ENCOUNTER — Ambulatory Visit: Payer: 59

## 2021-02-07 ENCOUNTER — Other Ambulatory Visit: Payer: Self-pay

## 2021-02-07 DIAGNOSIS — I2510 Atherosclerotic heart disease of native coronary artery without angina pectoris: Secondary | ICD-10-CM

## 2021-02-09 ENCOUNTER — Other Ambulatory Visit: Payer: Self-pay | Admitting: Cardiology

## 2021-02-09 DIAGNOSIS — I10 Essential (primary) hypertension: Secondary | ICD-10-CM

## 2021-02-09 DIAGNOSIS — I2510 Atherosclerotic heart disease of native coronary artery without angina pectoris: Secondary | ICD-10-CM

## 2021-02-09 LAB — BASIC METABOLIC PANEL
BUN/Creatinine Ratio: 21 (ref 9–23)
BUN: 38 mg/dL — ABNORMAL HIGH (ref 6–24)
CO2: 23 mmol/L (ref 20–29)
Calcium: 9 mg/dL (ref 8.7–10.2)
Chloride: 99 mmol/L (ref 96–106)
Creatinine, Ser: 1.82 mg/dL — ABNORMAL HIGH (ref 0.57–1.00)
Glucose: 149 mg/dL — ABNORMAL HIGH (ref 65–99)
Potassium: 4.9 mmol/L (ref 3.5–5.2)
Sodium: 138 mmol/L (ref 134–144)
eGFR: 32 mL/min/{1.73_m2} — ABNORMAL LOW (ref >59)

## 2021-02-09 NOTE — Progress Notes (Signed)
Noted Cr increase. Recommend increase hydration. Reduce torsemide to 20 mg only as needed. Okay to continue losartan 25 mg daily. Repeat BMP in 1 week.  Nigel Mormon, MD

## 2021-02-14 ENCOUNTER — Telehealth (HOSPITAL_COMMUNITY): Payer: Self-pay | Admitting: Family Medicine

## 2021-02-16 LAB — BASIC METABOLIC PANEL
BUN/Creatinine Ratio: 24 — ABNORMAL HIGH (ref 9–23)
BUN: 40 mg/dL — ABNORMAL HIGH (ref 6–24)
CO2: 21 mmol/L (ref 20–29)
Calcium: 8.8 mg/dL (ref 8.7–10.2)
Chloride: 97 mmol/L (ref 96–106)
Creatinine, Ser: 1.67 mg/dL — ABNORMAL HIGH (ref 0.57–1.00)
Glucose: 153 mg/dL — ABNORMAL HIGH (ref 65–99)
Potassium: 4.9 mmol/L (ref 3.5–5.2)
Sodium: 134 mmol/L (ref 134–144)
eGFR: 36 mL/min/{1.73_m2} — ABNORMAL LOW (ref >59)

## 2021-02-16 NOTE — Telephone Encounter (Signed)
Returned pt phone call left a VM for pt to call back, advised on the voicemail that we have a backlog right now for cardiac rehab.

## 2021-02-23 ENCOUNTER — Other Ambulatory Visit: Payer: 59

## 2021-02-25 ENCOUNTER — Ambulatory Visit: Payer: 59

## 2021-02-25 ENCOUNTER — Other Ambulatory Visit: Payer: Self-pay

## 2021-02-25 DIAGNOSIS — I2510 Atherosclerotic heart disease of native coronary artery without angina pectoris: Secondary | ICD-10-CM

## 2021-02-28 ENCOUNTER — Ambulatory Visit: Payer: 59 | Admitting: Student

## 2021-02-28 ENCOUNTER — Other Ambulatory Visit: Payer: Self-pay

## 2021-02-28 ENCOUNTER — Encounter: Payer: Self-pay | Admitting: Cardiology

## 2021-02-28 ENCOUNTER — Ambulatory Visit: Payer: 59 | Admitting: Cardiology

## 2021-02-28 VITALS — BP 124/59 | HR 68 | Temp 98.3°F | Resp 17 | Ht 63.0 in | Wt 209.0 lb

## 2021-02-28 DIAGNOSIS — I2510 Atherosclerotic heart disease of native coronary artery without angina pectoris: Secondary | ICD-10-CM

## 2021-02-28 DIAGNOSIS — I50812 Chronic right heart failure: Secondary | ICD-10-CM

## 2021-02-28 DIAGNOSIS — I5020 Unspecified systolic (congestive) heart failure: Secondary | ICD-10-CM

## 2021-02-28 DIAGNOSIS — I252 Old myocardial infarction: Secondary | ICD-10-CM

## 2021-02-28 DIAGNOSIS — I10 Essential (primary) hypertension: Secondary | ICD-10-CM

## 2021-02-28 MED ORDER — METOPROLOL SUCCINATE ER 25 MG PO TB24
12.5000 mg | ORAL_TABLET | Freq: Every day | ORAL | 3 refills | Status: DC
Start: 2021-02-28 — End: 2021-04-06

## 2021-02-28 MED ORDER — SACUBITRIL-VALSARTAN 24-26 MG PO TABS: 1.0000 | ORAL_TABLET | Freq: Two times a day (BID) | ORAL | Status: DC

## 2021-02-28 NOTE — Progress Notes (Signed)
Follow up visit  Subjective:   Colleen [name], female    DOB: 01-11-1966, 55 y.o.   MRN: [id number]   HPI   Chief Complaint  Patient presents with  . Coronary Artery Disease  . Atrial Fibrillation  . Hypertension  . Follow-up    4 week    55 y.o. Caucasian female  with hypertension, type 1 diabetes mellitus, CAD, late presentation inferior MI (12/979), complicated by complete AV block and AKI/CKD, RV failure, resolution of AV block and AKI on discharge, followed by repeat admission with asystole cardiac arrest (12/2020), now s/p PPM  Patient is increasing her physical activity, including stationary bicycle, as well as walking.  However, she continues to have symptoms of " being worn out" when she does more than usual physical activity.  For example, she was"wiped out" after walking [place] the hill at the beach.  She denies any chest pain.  She is compliant with medical therapy.  Reviewed recent echocardiogram and stress test results with the patient, details below.  Current Outpatient Medications on File Prior to Visit  Medication Sig Dispense Refill  . albuterol (PROVENTIL HFA;VENTOLIN HFA) 108 (90 Base) MCG/ACT inhaler Inhale 1-2 puffs into the lungs every 6 (six) hours as needed for wheezing or shortness of breath.    Marland Kitchen aspirin 81 MG EC tablet Take 1 tablet (81 mg total) by mouth daily. Swallow whole. 30 tablet 1  . Cholecalciferol (VITAMIN D) 2000 units tablet Take 2,000 Units by mouth daily.    . insulin lispro (HUMALOG) 100 UNIT/ML injection Inject into the skin See admin instructions. Insulin Pump - Mini Medtronic 670G    . loratadine (CLARITIN) 10 MG tablet Take 1 tablet by mouth daily as needed for allergies.    Marland Kitchen losartan (COZAAR) 25 MG tablet Take 1 tablet (25 mg total) by mouth daily. 90 tablet 3  . metoprolol succinate (TOPROL XL) 25 MG 24 hr tablet Take 0.5 tablets (12.5 mg total) by mouth daily. 90 tablet 3  . nitroGLYCERIN (NITROSTAT) 0.4 MG SL tablet Place 1 tablet  (0.4 mg total) under the tongue every 5 (five) minutes x 3 doses as needed for chest pain. 25 tablet 1  . rosuvastatin (CRESTOR) 10 MG tablet Take 10 mg by mouth at bedtime.    . ticagrelor (BRILINTA) 90 MG TABS tablet Take 1 tablet (90 mg total) by mouth 2 (two) times daily. 60 tablet 3  . tobramycin (TOBREX) 0.3 % ophthalmic solution Place 2 drops into both eyes as needed (prior to injections).     . torsemide (DEMADEX) 20 MG tablet Take 1 tablet (20 mg total) by mouth 2 (two) times daily as needed (leg swelling). 60 tablet 1   No current facility-administered medications on file prior to visit.    Cardiovascular & other pertient studies:  Echocardiogram 02/25/2021: Left ventricle cavity is normal in size. Normal left ventricular wall thickness. Moderate global hypokinesis. LVEF 40-45%.  Doppler evidence of grade II (pseudonormal) diastolic dysfunction, elevated LAP.  Left atrial cavity is moderately dilated. Right atrial cavity is mildly dilated. Right ventricle cavity is moderately dilated. Mildly reduced right ventricular function. Mild to moderate mitral regurgitation. Mild to moderate tricuspid regurgitation.  Estimated pulmonary artery systolic pressure 38 mmHg. Personally reviewed and compared all prior and current echocardiograms. LVEF is 40-45% and appears relatively unchanged. Pericardial effusion now absent. Estimated PASP increased from 21 to 38 mmHg. Pacemaker lead now seen.   Lexiscan Sestamibi stress test 02/07/2021: Lexiscan nuclear stress test performed using  1-day protocol. SPECT images show small sized, mild intensity fixed perfusion defect in basal inferoseptal myocardium, and a medium sized, severe intensity, predominantly fixed perfusion defect in basal to mid inferior/inferolateral myocardium with only minimal reversibility. There is absence of thickening seen in inferolateral myocardium. Stress LVEF 44%. Findings suggest old RCA/PDA infarct with minimal peri-infarct  ischemia. High risk study due to low LVEF and old infarct.   EKG 01/14/2021: Sinus rhythm 66 bpm First degree A-V block  Low voltage in precordial leads.  Inferior infarct -age undetermined   Recent labs: 02/15/2021: Glucose 153, BUN/Cr 40/1.67. EGFR 36. Na/K 1.34/39  01/12/2021: Glucose 126, BUN/Cr 43/1.43. EGFR 30. Na/K 134/4.8.  H/H 9.2/28.7. MCV 94. Platelets 199 HbA1C 7.5% Chol 118, TG 210, HDL 42, LDL 34  01/02/2021: AST/ALT 45/81 T.bili 1.4 AlKP 153  11/2020: TSH 1.8 normal   Review of Systems  Cardiovascular: Positive for dyspnea on exertion and leg swelling. Negative for chest pain, palpitations and syncope.         Vitals:   02/28/21 1307  BP: (!) 124/59  Pulse: 68  Resp: 17  Temp: 98.3 F (36.8 C)  SpO2: 99%     Body mass index is 37.02 kg/m. Filed Weights   02/28/21 1307  Weight: 209 lb (94.8 kg)     Objective:   Physical Exam Vitals and nursing note reviewed.  Constitutional:      General: She is not in acute distress. Neck:     Vascular: No JVD.  Cardiovascular:     Rate and Rhythm: Normal rate and regular rhythm.     Heart sounds: Normal heart sounds. No murmur heard.   Pulmonary:     Effort: Pulmonary effort is normal.     Breath sounds: Normal breath sounds. No wheezing or rales.  Musculoskeletal:     Right lower leg: Edema (2+) present.     Left lower leg: Edema (2+) present.           Assessment & Recommendations:   55 y.o. Caucasian female  with hypertension, type 1 diabetes mellitus, CAD, late presentation inferior MI (05/1504), complicated by complete AV block and AKI/CKD, RV failure, resolution of AV block and AKI on discharge, followed by repeat admission with asystole cardiac arrest (12/2020), now s/p PPM, anemia  CAD: Late presentation inferior STEMI with occluded RCA.  She also has moderate proximal/mid LAD disease, and left circumflex CTO. Currently, she has no angina symptoms.  Continue aggressive medical  management. Continue aspirin 81 mg daily. Start Brilinta 90 mg bid, after loading dose of 180 mg. Continue metoprolol succinate 12.5 mg daily, Crestor 10 mg daily. SPECT MPI (12/2020) showed small sized, mild intensity fixed perfusion defect in basal inferoseptal myocardium, and a medium sized, severe intensity, predominantly fixed perfusion defect in basal to mid inferior/inferolateral myocardium with only minimal reversibility.  I do not think she would have significant benefit from attempted left circumflex CTO revascularization.  Continue medical therapy.  HFrEF, right-sided heart failure: Ischemic cardiomyopathy.   LVEF 69-79%, mild RV systolic dysfunction. Her symptoms of exertional dyspnea and fatigue are likely related to her heart failure. In an attempt to improve her guideline directed medical therapy, we discussed changing losartan to [facility].  In the past, patient has been reluctant to use Entresto, as her cardiac arrest occurred while on Entresto.  I suspect her cardiac arrest was primarily due to conduction issues and not due to [facility]. Stop losartan. Started Entresto 24-26 mg twice daily.  Recheck BMP in 1-2 weeks.  In order to allow for initiation of Entresto, I have reduced metoprolol succinate to 12.5 mg daily. Continue torsemide 20 mg as needed Not on Farxiga given type 1 DM Recommend daily weight checks.    Complete AV block: Intermittent, now s/p PPM Currently in sinus rhythm with first-degree AV block. No hematoma at pacemaker site.  Wound check on 01/18/2021.  CKD 3b: She has tolerated 2 episodes of acute kidney injury after her recent hospitalizations.  Fortunately, she is recovered fairly well even after creatinine elevation to as high as 5.2. Currently, Cr 1.4-1.7.  Will monitor closely as we initiate Entresto. Continue f/u w/ Dr. Hollie Salk.  Type 1 DM: Continue insulin pump.  Follow-up with Dr. Chalmers Cater  Anemia: Likely anemia of chronic disease, improving  F/u in  4 weeks   Nigel Mormon, MD Pager: [phone] Office: [phone]

## 2021-03-02 ENCOUNTER — Telehealth (HOSPITAL_COMMUNITY): Payer: Self-pay

## 2021-03-02 ENCOUNTER — Encounter (HOSPITAL_COMMUNITY): Payer: Self-pay

## 2021-03-02 NOTE — Telephone Encounter (Signed)
Attempted to call patient in regards to Cardiac Rehab - LM on VM Mailed letter 

## 2021-03-02 NOTE — Telephone Encounter (Signed)
Pt returned phone call about cardiac rehab, pt stated that during the summer months her and her [place] stays at the beach and it would be difficult for her to schedule for cardiac rehab 3 days a week, pt stated that she would call us back when she is available. Closed referral

## 2021-03-17 LAB — BASIC METABOLIC PANEL
BUN/Creatinine Ratio: 23 (ref 9–23)
BUN: 30 mg/dL — ABNORMAL HIGH (ref 6–24)
CO2: 19 mmol/L — ABNORMAL LOW (ref 20–29)
Calcium: 8.9 mg/dL (ref 8.7–10.2)
Chloride: 104 mmol/L (ref 96–106)
Creatinine, Ser: 1.33 mg/dL — ABNORMAL HIGH (ref 0.57–1.00)
Glucose: 152 mg/dL — ABNORMAL HIGH (ref 65–99)
Potassium: 5 mmol/L (ref 3.5–5.2)
Sodium: 139 mmol/L (ref 134–144)
eGFR: 47 mL/min/{1.73_m2} — ABNORMAL LOW (ref >59)

## 2021-03-21 ENCOUNTER — Other Ambulatory Visit: Payer: Self-pay

## 2021-03-21 DIAGNOSIS — I2510 Atherosclerotic heart disease of native coronary artery without angina pectoris: Secondary | ICD-10-CM

## 2021-03-21 MED ORDER — ENTRESTO 24-26 MG PO TABS: 1.0000 | ORAL_TABLET | Freq: Two times a day (BID) | ORAL | 1 refills | Status: DC

## 2021-03-21 MED ORDER — TICAGRELOR 90 MG PO TABS: 90.0000 mg | ORAL_TABLET | Freq: Two times a day (BID) | ORAL | 3 refills | Status: DC

## 2021-04-05 NOTE — Progress Notes (Signed)
Follow up visit  Subjective:   Colleen [name], female    DOB: 08-18-1966, 55 y.o.   MRN: [id number]   HPI    Chief Complaint  Patient presents with   Congestive Heart Failure   Coronary Artery Disease   Follow-up    3-4 weeks    55 y.o. Caucasian female  with hypertension, type 1 diabetes mellitus, CAD, late presentation inferior MI (11/5327), complicated by complete AV block and AKI/CKD, RV failure, resolution of AV block and AKI on discharge, followed by repeat admission with asystole cardiac arrest (12/2020), now s/p PPM  Patient is walking and doing more bicycling. She [place] most of [place] the beach, and is trying to walk more. She has not had any significant exertional chest pain, but continues to have exertional dyspnea. She had two episodes of left lower back pain, one after she stopped after walking, and one while at rest. These episodes lasted for a few minutes, but has not occurred other times, Leg swelling is stable.   She is going to get a second opinion from [facility] cardiologist Dr. Darrold Span.   Current Outpatient Medications on File Prior to Visit  Medication Sig Dispense Refill   albuterol (PROVENTIL HFA;VENTOLIN HFA) 108 (90 Base) MCG/ACT inhaler Inhale 1-2 puffs into the lungs every 6 (six) hours as needed for wheezing or shortness of breath.     aspirin 81 MG EC tablet Take 1 tablet (81 mg total) by mouth daily. Swallow whole. (Patient not taking: Reported on 02/28/2021) 30 tablet 1   Cholecalciferol (VITAMIN D) 2000 units tablet Take 2,000 Units by mouth daily.     insulin lispro (HUMALOG) 100 UNIT/ML injection Inject into the skin See admin instructions. Insulin Pump - Mini Medtronic 670G     loratadine (CLARITIN) 10 MG tablet Take 1 tablet by mouth daily as needed for allergies.     metoprolol succinate (TOPROL XL) 25 MG 24 hr tablet Take 0.5 tablets (12.5 mg total) by mouth daily. 30 tablet 3   nitroGLYCERIN (NITROSTAT) 0.4 MG SL tablet Place 1  tablet (0.4 mg total) under the tongue every 5 (five) minutes x 3 doses as needed for chest pain. 25 tablet 1   rosuvastatin (CRESTOR) 10 MG tablet Take 10 mg by mouth at bedtime.     sacubitril-valsartan (ENTRESTO) 24-26 MG Take 1 tablet by mouth 2 (two) times daily. 180 tablet 1   ticagrelor (BRILINTA) 90 MG TABS tablet Take 1 tablet (90 mg total) by mouth 2 (two) times daily. 180 tablet 3   tobramycin (TOBREX) 0.3 % ophthalmic solution Place 2 drops into both eyes as needed (prior to injections).      torsemide (DEMADEX) 20 MG tablet Take 1 tablet (20 mg total) by mouth 2 (two) times daily as needed (leg swelling). 60 tablet 1   Current Facility-Administered Medications on File Prior to Visit  Medication Dose Route Frequency Provider Last Rate Last Admin   sacubitril-valsartan (ENTRESTO) 24-26 mg per tablet  1 tablet Oral BID Ashtyn Freilich, Reynold Bowen, MD        Cardiovascular & other pertient studies:  Echocardiogram 02/25/2021: Left ventricle cavity is normal in size. Normal left ventricular wall thickness. Moderate global hypokinesis. LVEF 40-45%.  Doppler evidence of grade II (pseudonormal) diastolic dysfunction, elevated LAP.  Left atrial cavity is moderately dilated. Right atrial cavity is mildly dilated. Right ventricle cavity is moderately dilated. Mildly reduced right ventricular function. Mild to moderate mitral regurgitation. Mild to moderate tricuspid regurgitation.  Estimated pulmonary artery systolic pressure 38 mmHg. Personally reviewed and compared all prior and current echocardiograms. LVEF is 40-45% and appears relatively unchanged. Pericardial effusion now absent. Estimated PASP increased from 21 to 38 mmHg. Pacemaker lead now seen.   Lexiscan Sestamibi stress test 02/07/2021: Lexiscan nuclear stress test performed using 1-day protocol. SPECT images show small sized, mild intensity fixed perfusion defect in basal inferoseptal myocardium, and a medium sized, severe intensity,  predominantly fixed perfusion defect in basal to mid inferior/inferolateral myocardium with only minimal reversibility. There is absence of thickening seen in inferolateral myocardium. Stress LVEF 44%. Findings suggest old RCA/PDA infarct with minimal peri-infarct ischemia. High risk study due to low LVEF and old infarct.   EKG 01/14/2021: Sinus rhythm 66 bpm First degree A-V block  Low voltage in precordial leads.  Inferior infarct -age undetermined   Recent labs: 03/16/2021: Glucose 152, BUN/Cr 30/1.33. EGFR 47. Na/K 139/5.0.   02/15/2021: Glucose 153, BUN/Cr 40/1.67. EGFR 36. Na/K 1.34/39  01/12/2021: Glucose 126, BUN/Cr 43/1.43. EGFR 30. Na/K 134/4.8.  H/H 9.2/28.7. MCV 94. Platelets 199 HbA1C 7.5% Chol 118, TG 210, HDL 42, LDL 34  01/02/2021: AST/ALT 45/81 T.bili 1.4 AlKP 153  11/2020: TSH 1.8 normal   Review of Systems  Cardiovascular:  Positive for dyspnea on exertion and leg swelling. Negative for chest pain, palpitations and syncope.        Vitals:   04/06/21 1033  BP: 132/81  Pulse: 67  Resp: 16  Temp: 97.6 F (36.4 C)  SpO2: 98%    Body mass index is 37.38 kg/m. Filed Weights   04/06/21 1033  Weight: 211 lb (95.7 kg)     Objective:   Physical Exam Vitals and nursing note reviewed.  Constitutional:      General: She is not in acute distress. Neck:     Vascular: No JVD.  Cardiovascular:     Rate and Rhythm: Normal rate and regular rhythm.     Heart sounds: Normal heart sounds. No murmur heard. Pulmonary:     Effort: Pulmonary effort is normal.     Breath sounds: Normal breath sounds. No wheezing or rales.  Musculoskeletal:     Right lower leg: Edema (Trace) present.     Left lower leg: Edema (Trace) present.          Assessment & Recommendations:   55 y.o. Caucasian female  with hypertension, type 1 diabetes mellitus, CAD, late presentation inferior MI (05/2425), complicated by complete AV block and AKI/CKD, RV failure, resolution of  AV block and AKI on discharge, followed by repeat admission with asystole cardiac arrest (12/2020), now s/p PPM, anemia  CAD: Late presentation inferior STEMI with occluded RCA.  She also has moderate proximal/mid LAD disease, and left circumflex CTO (11/2020). Currently, she has no angina symptoms.  Continue aggressive medical management. Continue aspirin 81 mg daily, Brilinta 90 mg bid Increase metoprolol succinate to 25 mg daily. Continue Crestor 10 mg daily. LDL 34.  SPECT MPI (12/2020) showed small sized, mild intensity fixed perfusion defect in basal inferoseptal myocardium, and a medium sized, severe intensity, predominantly fixed perfusion defect in basal to mid inferior/inferolateral myocardium with only minimal reversibility.  Cardiac MRI will be helpful in this situation to truly assess viability.  Unfortunately, given pacemaker placement, she is likely to have significant artifacts. She is going to seek second opinion from [facility] cardiologist Dr. Gust Brooms.  I will be happy to share her records with Dr. Darrold Span.  HFrEF, right-sided heart failure: Ischemic cardiomyopathy.  LVEF 83-65%, mild RV systolic dysfunction. NYHA class II symptoms Currently on Entresto 24-26 mg twice daily, metoprolol succinate 12.5 mg daily. Unable to uptitrate Entresto or add spironolactone given K 5.0, GFR 47. Increase metoprolol succinate to 25 mg daily. Continue torsemide 20 mg as needed Not on Farxiga given type 1 DM Recommend daily weight checks.    Complete AV block: Intermittent, now s/p PPM Currently in sinus rhythm with first-degree AV block.  CKD 3a: She has tolerated 2 episodes of acute kidney injury after her recent hospitalizations.  Fortunately, she is recovered fairly well even after creatinine elevation to as high as 5.2. Currently, Cr 1.4-1.7.   Tolerating Entresto without increase in Cr Continue f/u w/ Dr. Hollie Salk.  Type 1 DM: Continue insulin pump.  Follow-up with  Dr. Chalmers Cater  Anemia: Likely anemia of chronic disease, improving  F/u in 3 months   Nigel Mormon, MD Pager: [phone] Office: [phone]

## 2021-04-06 ENCOUNTER — Other Ambulatory Visit: Payer: Self-pay

## 2021-04-06 ENCOUNTER — Ambulatory Visit: Payer: 59 | Admitting: Cardiology

## 2021-04-06 ENCOUNTER — Encounter: Payer: Self-pay | Admitting: Cardiology

## 2021-04-06 DIAGNOSIS — I50812 Chronic right heart failure: Secondary | ICD-10-CM

## 2021-04-06 DIAGNOSIS — I5020 Unspecified systolic (congestive) heart failure: Secondary | ICD-10-CM

## 2021-04-06 MED ORDER — METOPROLOL SUCCINATE ER 25 MG PO TB24: 25.0000 mg | ORAL_TABLET | Freq: Every day | ORAL | 3 refills | Status: DC

## 2021-04-08 ENCOUNTER — Ambulatory Visit (INDEPENDENT_AMBULATORY_CARE_PROVIDER_SITE_OTHER): Payer: 59

## 2021-04-08 DIAGNOSIS — I442 Atrioventricular block, complete: Secondary | ICD-10-CM | POA: Diagnosis not present

## 2021-04-12 ENCOUNTER — Telehealth: Payer: Self-pay

## 2021-04-12 LAB — CUP PACEART REMOTE DEVICE CHECK
Battery Remaining Longevity: 73 mo
Battery Remaining Percentage: 95.5 %
Battery Voltage: 3.01 V
Brady Statistic AP VP Percent: 2.9 %
Brady Statistic AP VS Percent: 30 %
Brady Statistic AS VP Percent: 5.5 %
Brady Statistic AS VS Percent: 61 %
Brady Statistic RA Percent Paced: 30 %
Brady Statistic RV Percent Paced: 9.5 %
Date Time Interrogation Session: 20220715030209
Implantable Lead Implant Date: 20220414
Implantable Lead Implant Date: 20220414
Implantable Lead Location: 753859
Implantable Lead Location: 753860
Implantable Lead Model: 1944
Implantable Pulse Generator Implant Date: 20220414
Lead Channel Impedance Value: 440 Ohm
Lead Channel Impedance Value: 440 Ohm
Lead Channel Pacing Threshold Amplitude: 0.75 V
Lead Channel Pacing Threshold Amplitude: 1.75 V
Lead Channel Pacing Threshold Pulse Width: 0.5 ms
Lead Channel Pacing Threshold Pulse Width: 0.8 ms
Lead Channel Sensing Intrinsic Amplitude: 12 mV
Lead Channel Sensing Intrinsic Amplitude: 2.8 mV
Lead Channel Setting Pacing Amplitude: 3.5 V
Lead Channel Setting Pacing Amplitude: 3.5 V
Lead Channel Setting Pacing Pulse Width: 0.5 ms
Lead Channel Setting Sensing Sensitivity: 2 mV
Pulse Gen Model: 2272
Pulse Gen Serial Number: 3920245

## 2021-04-12 NOTE — Telephone Encounter (Signed)
Scheduled remote reviewed. Normal device function.  Multiple AMS events.  Longest duration just under 17hrs on 6/30.  Presenting rhythm, patient is normal rhythm today 04/12/21. Likely flutter with CVR, burden 1.3%.  No OAC noted in Epic. HAs 91 day f/u 04/22/21.  Routing to Dr Quentin Ore for review.

## 2021-04-14 NOTE — Telephone Encounter (Signed)
Patient called in with questions about transmission and has questions and will like a nurse to give her a call back

## 2021-04-14 NOTE — Telephone Encounter (Signed)
Returned call to Pt.  Advised that she had had atrial fibrillation found on her pacemaker report.    Pt questioned that her pacemaker did not prevent afib?  Provided education on function of pacemaker.  Advised that with a finding of atrial fibrillation the treatment for this is a blood thinner to prevent stroke.  Pt has concerns related to starting a blood thinner, she stated she previously had been on ASA, Plavix and a blood thinner and states she "was a bloody mess".  She states she had frequent nose bleeds.  Pt states she is getting a cardiac second opinion at [facility] next Wednesday.  Has follow up with Dr. Quentin Ore April 21, 2021.  Advised would discuss further with Dr. Quentin Ore and return call.  Pt appreciative.

## 2021-04-18 NOTE — Telephone Encounter (Signed)
Spoke with Dr. Quentin Ore.  Per Dr. Quentin Ore he advises starting blood thinner.  Attempted outreach to Pt to advise.  Call went to VM.  Requested call back.

## 2021-04-18 NOTE — Telephone Encounter (Signed)
Call back received from Pt.  Pt concerned about starting another blood thinner when she is "already on two".  Explained that ASA and Brilinta were anti-platelet medications and would not prevent a stroke caused by atrial fibrillation.  Explained to prevent stroke from afib she needed to take a medication like Eliquis or Xarelto.  Pt indicates understanding.  She is still concerned about taking ASA, Brilinta and Eliquis all together.  She requests that Dr. Virgina Jock weigh in on if she would need to be on all three at the same time.  Advised would forward message to him to advise and would follow back up with Pt.

## 2021-04-18 NOTE — Telephone Encounter (Signed)
Thank you your message. She had an MI, but did not have any intervention given her late presentation. If she would be on eliquis now for Afib, I would recommend stopping Aspirin and Brilinta, and start plavix as the only antiplatelet agent. Bleeding risk with plavix and eliquis will be lower than Aspirin, Brilinta, and eliquis. I am happy to discuss with the patient further.  Thanks MJP

## 2021-04-19 ENCOUNTER — Other Ambulatory Visit: Payer: Self-pay | Admitting: Cardiology

## 2021-04-19 DIAGNOSIS — I2510 Atherosclerotic heart disease of native coronary artery without angina pectoris: Secondary | ICD-10-CM

## 2021-04-19 DIAGNOSIS — I480 Paroxysmal atrial fibrillation: Secondary | ICD-10-CM

## 2021-04-19 MED ORDER — APIXABAN 5 MG PO TABS: 5.0000 mg | ORAL_TABLET | Freq: Two times a day (BID) | ORAL | 3 refills | Status: DC

## 2021-04-19 MED ORDER — CLOPIDOGREL BISULFATE 75 MG PO TABS: 75.0000 mg | ORAL_TABLET | Freq: Every day | ORAL | 3 refills | Status: DC

## 2021-04-19 NOTE — Progress Notes (Signed)
New diagnosis PAF Stopped Aspirin/Brilinta Started Eliquis and plavix.   Nigel Mormon, MD Pager: [phone] Office: [phone]

## 2021-04-19 NOTE — Telephone Encounter (Signed)
I will order it.  Thanks MJP

## 2021-04-21 NOTE — Progress Notes (Signed)
Electrophysiology Office Follow up Visit Note:    Date:  04/22/2021   ID:  Colleen [name], DOB [birth year], MRN [id number]  PCP:  Orpah Melter, MD  [facility] Cardiologist:  None  [facility] Electrophysiologist:  None    Interval History:    Colleen [name] is a 55 y.o. female who presents for a follow up visit after PPM implant 01/06/2021 in setting of complete heart block and severe CAD. Her EF is normal.  The patient recently was seen at [facility] in consultation for her severe CAD and whether any revascularization options are available. They are planning for a stress MRI to assess viability.  After PPM implant, new AF was detected and she was started on anticoagulation.   Today she presents for follow-up.  The implant site has healed well.    Past Medical History:  Diagnosis Date   Asthma    Complication of anesthesia    Psuedocholinerasterase deficiency per pt   Diabetes mellitus    TYPE II   Diabetic macular edema ([place])    Pt gets injections in eyes with [facility]   Seasonal allergies     Past Surgical History:  Procedure Laterality Date   BACK SURGERY  2009   CATARACT SURGERY     BILATERAL   CESAREAN SECTION     X2   KNEE SURGERY Right    LEFT HEART CATH AND CORONARY ANGIOGRAPHY N/A 05/08/2017   Procedure: Left Heart Cath and Coronary Angiography;  Surgeon: Nigel Mormon, MD;  Location: [place] [facility];  Service: Cardiovascular;  Laterality: N/A;   ORIF ANKLE FRACTURE Left 03/23/2016   Procedure: OPEN REDUCTION INTERNAL FIXATION (ORIF) ANKLE FRACTURE MALLEOLUS  MALUNION WITH TIBIAL AND FIBULAR OSTEOTOMY AND AUTOGRAFT;  Surgeon: Wylene Simmer, MD;  Location: [facility];  Service: Orthopedics;  Laterality: Left;   PACEMAKER IMPLANT N/A 01/06/2021   Procedure: PACEMAKER IMPLANT;  Surgeon: Vickie Epley, MD;  Location: [place] [facility];  Service: Cardiovascular;  Laterality: N/A;   RIGHT/LEFT HEART CATH AND CORONARY ANGIOGRAPHY N/A  12/24/2020   Procedure: RIGHT/LEFT HEART CATH AND CORONARY ANGIOGRAPHY;  Surgeon: Nigel Mormon, MD;  Location: [place] [facility];  Service: Cardiovascular;  Laterality: N/A;   SHOULDER ARTHROSCOPY W/ ROTATOR CUFF REPAIR Left    TEMPORARY PACEMAKER N/A 12/19/2020   Procedure: TEMPORARY PACEMAKER;  Surgeon: Nigel Mormon, MD;  Location: [place] [facility];  Service: Cardiovascular;  Laterality: N/A;   TEMPORARY PACEMAKER N/A 12/19/2020   Procedure: TEMPORARY PACEMAKER;  Surgeon: Nigel Mormon, MD;  Location: [place] [facility];  Service: Cardiovascular;  Laterality: N/A;   TEMPORARY PACEMAKER N/A 01/02/2021   Procedure: TEMPORARY PACEMAKER;  Surgeon: Martinique, Peter M, MD;  Location: [place] [facility];  Service: Cardiovascular;  Laterality: N/A;    Current Medications: Current Meds  Medication Sig   albuterol (PROVENTIL HFA;VENTOLIN HFA) 108 (90 Base) MCG/ACT inhaler Inhale 1-2 puffs into the lungs every 6 (six) hours as needed for wheezing or shortness of breath.   apixaban (ELIQUIS) 5 MG TABS tablet Take 1 tablet (5 mg total) by mouth 2 (two) times daily.   Cholecalciferol (VITAMIN D) 2000 units tablet Take 2,000 Units by mouth daily.   clopidogrel (PLAVIX) 75 MG tablet Take 1 tablet (75 mg total) by mouth daily.   insulin lispro (HUMALOG) 100 UNIT/ML injection Inject into the skin See admin instructions. Insulin Pump - Mini Medtronic 670G   loratadine (CLARITIN) 10 MG tablet Take 1 tablet  by mouth daily as needed for allergies.   metoprolol succinate (TOPROL XL) 25 MG 24 hr tablet Take 1 tablet (25 mg total) by mouth daily.   nitroGLYCERIN (NITROSTAT) 0.4 MG SL tablet Place 1 tablet (0.4 mg total) under the tongue every 5 (five) minutes x 3 doses as needed for chest pain.   rosuvastatin (CRESTOR) 10 MG tablet Take 10 mg by mouth at bedtime.   sacubitril-valsartan (ENTRESTO) 24-26 MG Take 1 tablet by mouth 2 (two) times daily.   tobramycin (TOBREX) 0.3 % ophthalmic solution  Place 2 drops into both eyes as needed (prior to injections).    torsemide (DEMADEX) 20 MG tablet Take 1 tablet (20 mg total) by mouth 2 (two) times daily as needed (leg swelling).   Current Facility-Administered Medications for the 04/22/21 encounter (Office Visit) with Vickie Epley, MD  Medication   sacubitril-valsartan (ENTRESTO) 24-26 mg per tablet     Allergies:   Iodine, Iohexol, Other, and Succinylcholine   Social History   Socioeconomic History   Marital status: Married    Spouse name: Not on file   Number of children: 2   Years of education: Not on file   Highest education level: Not on file  Occupational History   Not on file  Tobacco Use   Smoking status: Former    Packs/day: 0.25    Years: 2.00    Pack years: 0.50    Types: Cigarettes    Quit date: 01/17/1987    Years since quitting: 34.2   Smokeless tobacco: Never  Vaping Use   Vaping Use: Never used  Substance and Sexual Activity   Alcohol use: Yes    Comment: occ   Drug use: No   Sexual activity: Yes    Comment: PATIENT'S PARTNER WITH VASECTOMY  Other Topics Concern   Not on file  Social History Narrative   Not on file   Social Determinants of Health   Financial Resource Strain: Not on file  Food Insecurity: Not on file  Transportation Needs: Not on file  Physical Activity: Not on file  Stress: Not on file  Social Connections: Not on file     Family History: The patient's family history includes Hypertension in her father, paternal aunt, paternal grandfather, and paternal grandmother.  ROS:   Please see the history of present illness.    All other systems reviewed and are negative.  EKGs/Labs/Other Studies Reviewed:    The following studies were reviewed today: [facility] records, PPM implant records   April 22, 2021 in clinic device interrogation personally reviewed Lead parameters are stable including atrial capture threshold of 1 at 0.8, sensing threshold 3.6 mV and lead impedance of  480 ohms.  Ventricular lead capture threshold 0.75-0.8, sensing greater than 12 mV and an impedance of 510 ohms.  She is programmed DDD 60-1 40.  I did reprogram her to 60-1 40 DDDR.  34% atrially pacing, 11% ventricular pacing.  33 mode switch episodes for an AT/AF burden of 1.1%   EKG:  The ekg ordered today demonstrates sinus rhythm with a first-degree AV delay and a ventricular rate of 62 bpm.  Low voltage  Recent Labs: 12/21/2020: TSH 1.862 01/02/2021: ALT 81 01/03/2021: Magnesium 1.7 01/05/2021: B Natriuretic Peptide 1,578.7 01/10/2021: Hemoglobin 9.2; Platelets 199 03/16/2021: BUN 30; Creatinine, Ser 1.33; Potassium 5.0; Sodium 139  Recent Lipid Panel    Component Value Date/Time   CHOL 118 01/02/2021 0136   CHOL 164 10/08/2019 0901   TRIG 80 01/03/2021 0527  HDL 42 01/02/2021 0136   HDL 99 10/08/2019 0901   CHOLHDL 2.8 01/02/2021 0136   VLDL 42 (H) 01/02/2021 0136   LDLCALC 34 01/02/2021 0136   LDLCALC 54 10/08/2019 0901   LDLDIRECT 61 10/08/2019 0901    Physical Exam:    VS:  BP 122/80   Pulse 62   Ht '5\' 3"'$  (1.6 m)   Wt 212 lb (96.2 kg)   SpO2 98%   BMI 37.55 kg/m     Wt Readings from Last 3 Encounters:  04/22/21 212 lb (96.2 kg)  04/06/21 211 lb (95.7 kg)  02/28/21 209 lb (94.8 kg)     GEN:  Well nourished, well developed in no acute distress HEENT: Normal NECK: No JVD; No carotid bruits LYMPHATICS: No lymphadenopathy CARDIAC: RRR, no murmurs, rubs, gallops.  Pacemaker site well-healed. RESPIRATORY:  Clear to auscultation without rales, wheezing or rhonchi  ABDOMEN: Soft, non-tender, non-distended MUSCULOSKELETAL:  No edema; No deformity  SKIN: Warm and dry NEUROLOGIC:  Alert and oriented x 3 PSYCHIATRIC:  Normal affect   ASSESSMENT:    1. PAF (paroxysmal atrial fibrillation) ([place])   2. Heart block AV complete ([place])   3. Coronary artery disease involving native coronary artery of native heart without angina pectoris   4. Cardiac pacemaker in situ     PLAN:    In order of problems listed above:  1. PAF (paroxysmal atrial fibrillation) ([facility]) Detected on device.  On Eliquis and tolerating this without bleeding issues.  Overall very low burden, we will continue to follow the burden of her atrial arrhythmia via remote interrogations.  2. Heart block AV complete ([place]) Doing well after permanent pacemaker implant.  Her base rate is set at 60 bpm without rate response on.  She tells me with exertion she is fatigued which could be poor chronotropic competence.  I will turn on rate responsiveness today on her pacemaker and we will monitor for any improved symptom burden.  Her atrial lead is a passive fixation lead made by [facility].  Details can be seen in the device interrogation.  This was placed because of low voltage throughout the right atrium at the time of implant.  With an active-fixation lead I mapped the lateral wall, septum, CS os, floor without acceptable voltages for reliable sensing.  A passive fixation lead allowed for better sensing which is today at 3.6 mV.  3. Coronary artery disease involving native coronary artery of native heart without angina pectoris Work-up with [facility] cardiology/cardiothoracic surgery ongoing.  They are planning for stress MRI to assess viability.  We discussed this briefly today.  4. Cardiac pacemaker in situ Device doing well.  Continue remote interrogations.   Total time spent with patient today 35 minutes. This includes reviewing records, evaluating the patient and coordinating care.   Medication Adjustments/Labs and Tests Ordered: Current medicines are reviewed at length with the patient today.  Concerns regarding medicines are outlined above.  No orders of the defined types were placed in this encounter.  No orders of the defined types were placed in this encounter.    Signed, Lars Mage, MD, [facility], [facility] 04/22/2021 2:30 PM    Electrophysiology [facility]

## 2021-04-22 ENCOUNTER — Ambulatory Visit (INDEPENDENT_AMBULATORY_CARE_PROVIDER_SITE_OTHER): Payer: 59 | Admitting: Cardiology

## 2021-04-22 ENCOUNTER — Encounter: Payer: Self-pay | Admitting: Cardiology

## 2021-04-22 ENCOUNTER — Other Ambulatory Visit: Payer: Self-pay

## 2021-04-22 VITALS — BP 122/80 | HR 62 | Ht 63.0 in | Wt 212.0 lb

## 2021-04-22 DIAGNOSIS — I480 Paroxysmal atrial fibrillation: Secondary | ICD-10-CM | POA: Diagnosis not present

## 2021-04-22 DIAGNOSIS — I442 Atrioventricular block, complete: Secondary | ICD-10-CM

## 2021-04-22 DIAGNOSIS — Z950 Presence of cardiac pacemaker: Secondary | ICD-10-CM | POA: Diagnosis not present

## 2021-04-22 DIAGNOSIS — I2510 Atherosclerotic heart disease of native coronary artery without angina pectoris: Secondary | ICD-10-CM

## 2021-04-22 NOTE — Patient Instructions (Addendum)
Medication Instructions:  Your physician recommends that you continue on your current medications as directed. Please refer to the Current Medication list given to you today. *If you need a refill on your cardiac medications before your next appointment, please call your pharmacy*  Lab Work: None ordered. If you have labs (blood work) drawn today and your tests are completely normal, you will receive your results only by: [place] (if you have MyChart) OR A paper copy in the mail If you have any lab test that is abnormal or we need to change your treatment, we will call you to review the results.  Testing/Procedures: None ordered.  Follow-Up: At [facility], you and your health needs are our priority.  As part of our continuing mission to provide you with exceptional heart care, we have created designated Provider Care Teams.  These Care Teams include your primary Cardiologist (physician) and Advanced Practice Providers (APPs -  Physician Assistants and Nurse Practitioners) who all work together to provide you with the care you need, when you need it.  Your next appointment:   Your physician wants you to follow-up in: 9 months with Vickie Epley, MD or one of the following Advanced Practice Providers on your designated Care Team:   Tommye Standard, [place] Legrand Como "Jonni Sanger" [place], [place] You will receive a reminder letter in the mail two months in advance. If you don't receive a letter, please call our office to schedule the follow-up appointment.  Remote monitoring is used to monitor your Pacemaker from home. This monitoring reduces the number of office visits required to check your device to one time per year. It allows [place] to keep an eye on the functioning of your device to ensure it is working properly. You are scheduled for a device check from home on 07/08/2021. You may send your transmission at any time that day. If you have a wireless device, the transmission will be sent  automatically. After your physician reviews your transmission, you will receive a postcard with your next transmission date.

## 2021-04-26 NOTE — Addendum Note (Signed)
Addended by: Jacinta Shoe on: 04/26/2021 08:40 AM   Modules accepted: Orders

## 2021-05-02 NOTE — Progress Notes (Signed)
Remote pacemaker transmission.   

## 2021-05-11 ENCOUNTER — Other Ambulatory Visit: Payer: Self-pay

## 2021-05-11 DIAGNOSIS — I2510 Atherosclerotic heart disease of native coronary artery without angina pectoris: Secondary | ICD-10-CM

## 2021-05-11 DIAGNOSIS — I480 Paroxysmal atrial fibrillation: Secondary | ICD-10-CM

## 2021-05-11 MED ORDER — APIXABAN 5 MG PO TABS
5.0000 mg | ORAL_TABLET | Freq: Two times a day (BID) | ORAL | 3 refills | Status: DC
Start: 2021-05-11 — End: 2022-04-14

## 2021-05-11 MED ORDER — CLOPIDOGREL BISULFATE 75 MG PO TABS
75.0000 mg | ORAL_TABLET | Freq: Every day | ORAL | 3 refills | Status: DC
Start: 2021-05-11 — End: 2022-01-27

## 2021-06-11 ENCOUNTER — Other Ambulatory Visit: Payer: Self-pay | Admitting: Cardiology

## 2021-06-11 DIAGNOSIS — I50813 Acute on chronic right heart failure: Secondary | ICD-10-CM

## 2021-06-21 NOTE — Telephone Encounter (Signed)
From patient.

## 2021-07-07 ENCOUNTER — Ambulatory Visit: Payer: 59 | Admitting: Cardiology

## 2021-07-08 ENCOUNTER — Ambulatory Visit (INDEPENDENT_AMBULATORY_CARE_PROVIDER_SITE_OTHER): Payer: 59

## 2021-07-08 DIAGNOSIS — I442 Atrioventricular block, complete: Secondary | ICD-10-CM | POA: Diagnosis not present

## 2021-07-08 LAB — CUP PACEART REMOTE DEVICE CHECK
Battery Remaining Longevity: 89 mo
Battery Remaining Percentage: 95.5 %
Battery Voltage: 3.01 V
Brady Statistic AP VP Percent: 62 %
Brady Statistic AP VS Percent: 11 %
Brady Statistic AS VP Percent: 10 %
Brady Statistic AS VS Percent: 15 %
Brady Statistic RA Percent Paced: 72 %
Brady Statistic RV Percent Paced: 72 %
Date Time Interrogation Session: 20221014020013
Implantable Lead Implant Date: 20220414
Implantable Lead Implant Date: 20220414
Implantable Lead Location: 753859
Implantable Lead Location: 753860
Implantable Lead Model: 1944
Implantable Pulse Generator Implant Date: 20220414
Lead Channel Impedance Value: 440 Ohm
Lead Channel Impedance Value: 510 Ohm
Lead Channel Pacing Threshold Amplitude: 0.75 V
Lead Channel Pacing Threshold Amplitude: 1 V
Lead Channel Pacing Threshold Pulse Width: 0.5 ms
Lead Channel Pacing Threshold Pulse Width: 0.8 ms
Lead Channel Sensing Intrinsic Amplitude: 12 mV
Lead Channel Sensing Intrinsic Amplitude: 3.1 mV
Lead Channel Setting Pacing Amplitude: 2.5 V
Lead Channel Setting Pacing Amplitude: 2.5 V
Lead Channel Setting Pacing Pulse Width: 0.5 ms
Lead Channel Setting Sensing Sensitivity: 2 mV
Pulse Gen Model: 2272
Pulse Gen Serial Number: 3920245

## 2021-07-11 ENCOUNTER — Other Ambulatory Visit: Payer: Self-pay

## 2021-07-11 MED ORDER — ENTRESTO 24-26 MG PO TABS
1.0000 | ORAL_TABLET | Freq: Two times a day (BID) | ORAL | 1 refills | Status: DC
Start: 2021-07-11 — End: 2021-08-25

## 2021-07-14 ENCOUNTER — Ambulatory Visit: Payer: 59 | Admitting: Cardiology

## 2021-07-15 NOTE — Progress Notes (Signed)
Remote pacemaker transmission.   

## 2021-08-24 ENCOUNTER — Encounter: Payer: Self-pay | Admitting: Cardiology

## 2021-08-24 NOTE — Telephone Encounter (Signed)
From pt

## 2021-08-25 ENCOUNTER — Other Ambulatory Visit: Payer: Self-pay | Admitting: Cardiology

## 2021-09-29 ENCOUNTER — Encounter: Payer: Self-pay | Admitting: Cardiology

## 2021-09-30 ENCOUNTER — Other Ambulatory Visit: Payer: Self-pay

## 2021-09-30 ENCOUNTER — Encounter (HOSPITAL_BASED_OUTPATIENT_CLINIC_OR_DEPARTMENT_OTHER): Payer: 59

## 2021-09-30 ENCOUNTER — Other Ambulatory Visit: Payer: Self-pay | Admitting: Cardiology

## 2021-09-30 DIAGNOSIS — I2510 Atherosclerotic heart disease of native coronary artery without angina pectoris: Secondary | ICD-10-CM

## 2021-09-30 MED ORDER — ROSUVASTATIN CALCIUM 10 MG PO TABS: 10.0000 mg | ORAL_TABLET | Freq: Every day | ORAL | 3 refills | Status: DC

## 2021-09-30 NOTE — Telephone Encounter (Signed)
From patient.

## 2021-10-03 ENCOUNTER — Other Ambulatory Visit (HOSPITAL_BASED_OUTPATIENT_CLINIC_OR_DEPARTMENT_OTHER): Payer: Self-pay | Admitting: Nurse Practitioner

## 2021-10-03 DIAGNOSIS — Z1231 Encounter for screening mammogram for malignant neoplasm of breast: Secondary | ICD-10-CM

## 2021-10-04 ENCOUNTER — Encounter (HOSPITAL_BASED_OUTPATIENT_CLINIC_OR_DEPARTMENT_OTHER): Payer: Self-pay

## 2021-10-04 ENCOUNTER — Other Ambulatory Visit: Payer: Self-pay

## 2021-10-04 ENCOUNTER — Ambulatory Visit (HOSPITAL_BASED_OUTPATIENT_CLINIC_OR_DEPARTMENT_OTHER)
Admission: RE | Admit: 2021-10-04 | Discharge: 2021-10-04 | Disposition: A | Discharge status: Home / Self Care | Payer: 59 | Source: Ambulatory Visit | Attending: Nurse Practitioner | Admitting: Nurse Practitioner

## 2021-10-04 DIAGNOSIS — Z1231 Encounter for screening mammogram for malignant neoplasm of breast: Secondary | ICD-10-CM | POA: Insufficient documentation

## 2021-10-04 NOTE — Telephone Encounter (Signed)
Please advise 

## 2021-10-07 ENCOUNTER — Ambulatory Visit (INDEPENDENT_AMBULATORY_CARE_PROVIDER_SITE_OTHER): Payer: 59

## 2021-10-07 DIAGNOSIS — I442 Atrioventricular block, complete: Secondary | ICD-10-CM | POA: Diagnosis not present

## 2021-10-07 LAB — CUP PACEART REMOTE DEVICE CHECK
Battery Remaining Longevity: 91 mo
Battery Remaining Percentage: 95 %
Battery Voltage: 3.01 V
Brady Statistic AP VP Percent: 48 %
Brady Statistic AP VS Percent: 19 %
Brady Statistic AS VP Percent: 7.2 %
Brady Statistic AS VS Percent: 26 %
Brady Statistic RA Percent Paced: 66 %
Brady Statistic RV Percent Paced: 55 %
Date Time Interrogation Session: 20230113020027
Implantable Lead Implant Date: 20220414
Implantable Lead Implant Date: 20220414
Implantable Lead Location: 753859
Implantable Lead Location: 753860
Implantable Lead Model: 1944
Implantable Pulse Generator Implant Date: 20220414
Lead Channel Impedance Value: 510 Ohm
Lead Channel Impedance Value: 510 Ohm
Lead Channel Pacing Threshold Amplitude: 0.75 V
Lead Channel Pacing Threshold Amplitude: 1 V
Lead Channel Pacing Threshold Pulse Width: 0.5 ms
Lead Channel Pacing Threshold Pulse Width: 0.8 ms
Lead Channel Sensing Intrinsic Amplitude: 12 mV
Lead Channel Sensing Intrinsic Amplitude: 2.5 mV
Lead Channel Setting Pacing Amplitude: 2.5 V
Lead Channel Setting Pacing Amplitude: 2.5 V
Lead Channel Setting Pacing Pulse Width: 0.5 ms
Lead Channel Setting Sensing Sensitivity: 2 mV
Pulse Gen Model: 2272
Pulse Gen Serial Number: 3920245

## 2021-10-18 NOTE — Progress Notes (Signed)
Remote pacemaker transmission.   

## 2021-11-10 ENCOUNTER — Encounter: Payer: Self-pay | Admitting: Cardiology

## 2021-12-05 HISTORY — PX: CORONARY ARTERY BYPASS GRAFT: SHX141

## 2021-12-19 ENCOUNTER — Ambulatory Visit: Payer: 59 | Admitting: Cardiology

## 2021-12-19 IMAGING — CR DG CHEST 2V
2 series · 2 of 2 positions shown · non-contrast
Comparison: 01/03/2021

CLINICAL DATA: Pacemaker placement

EXAM:
CHEST - 2 VIEW

[chest pa]
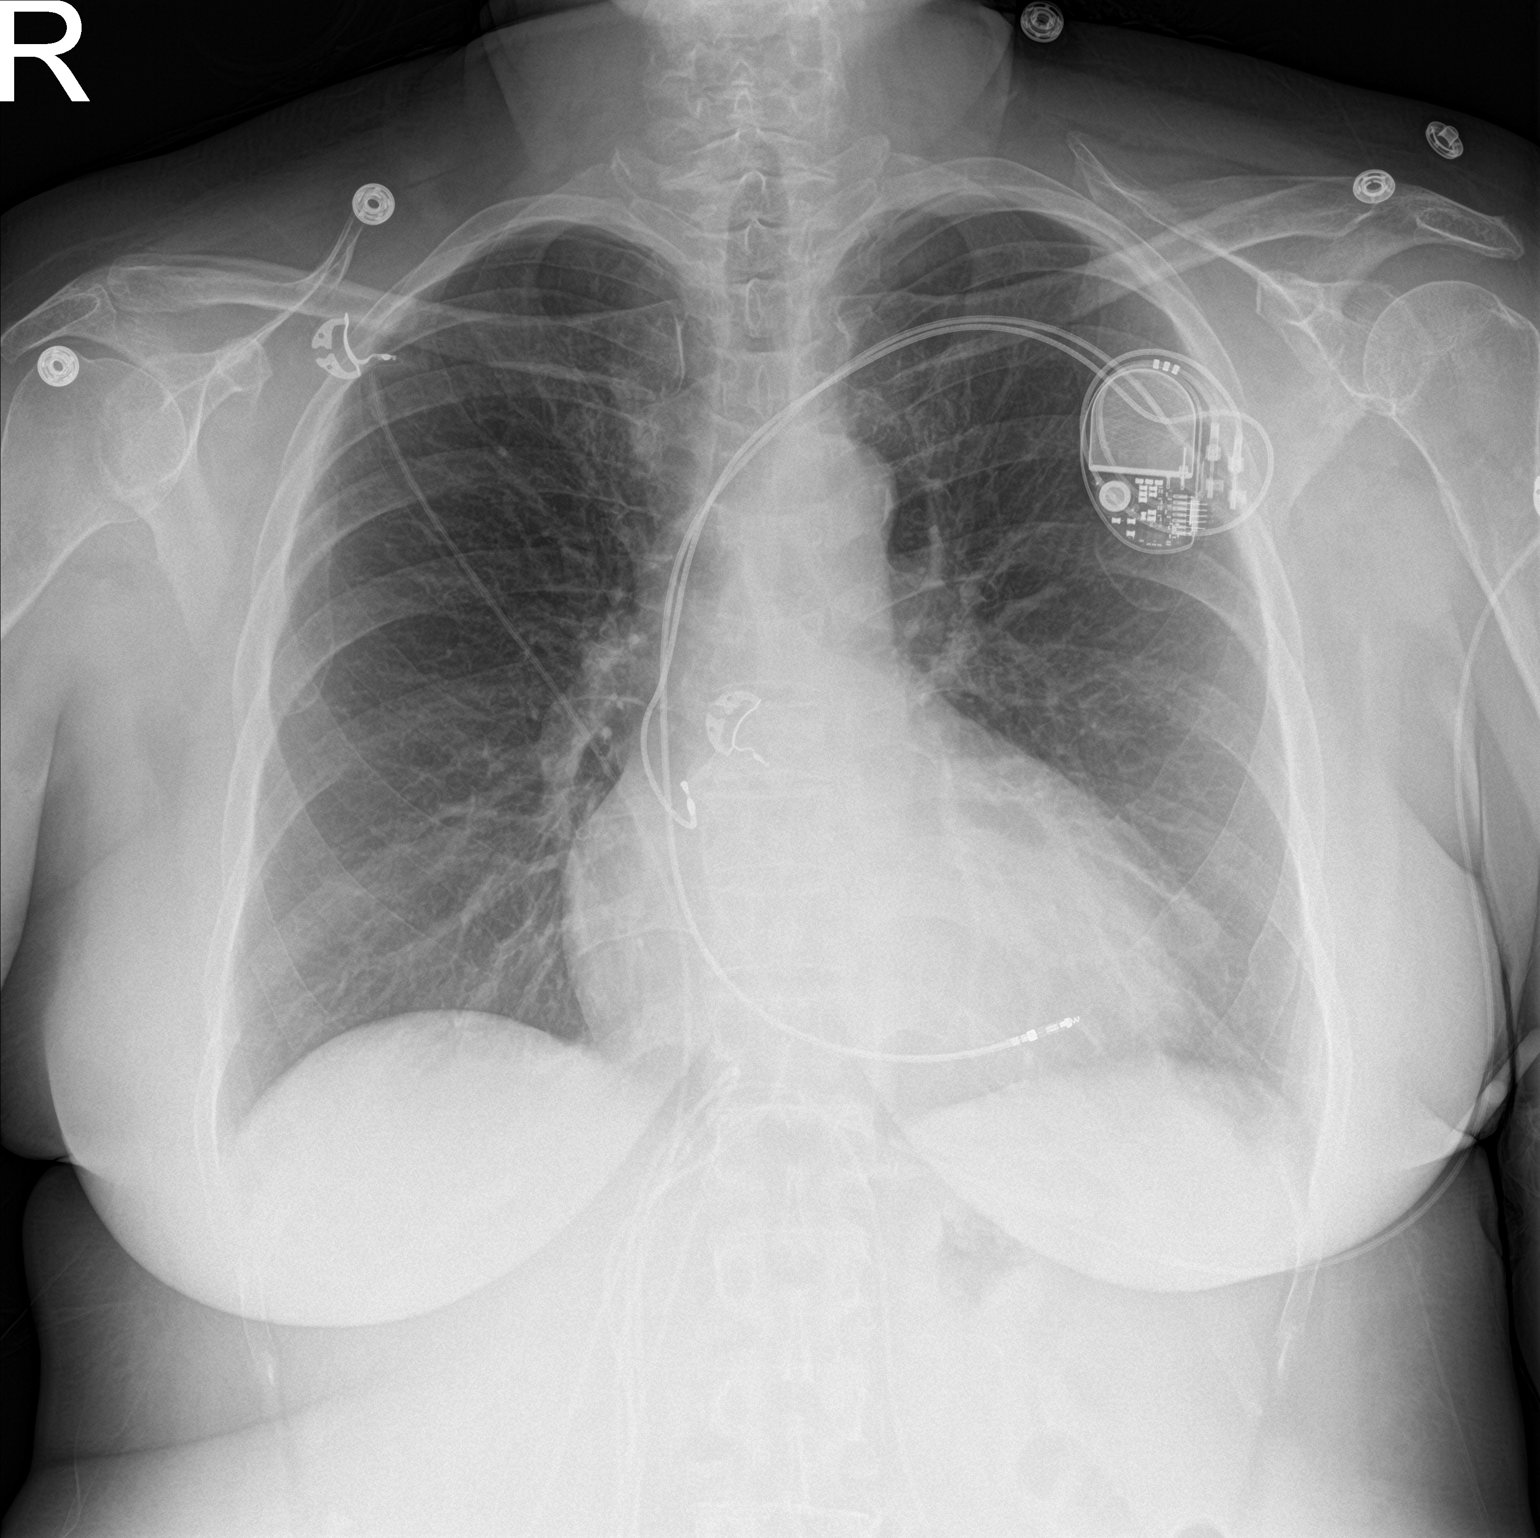

[chest lat]
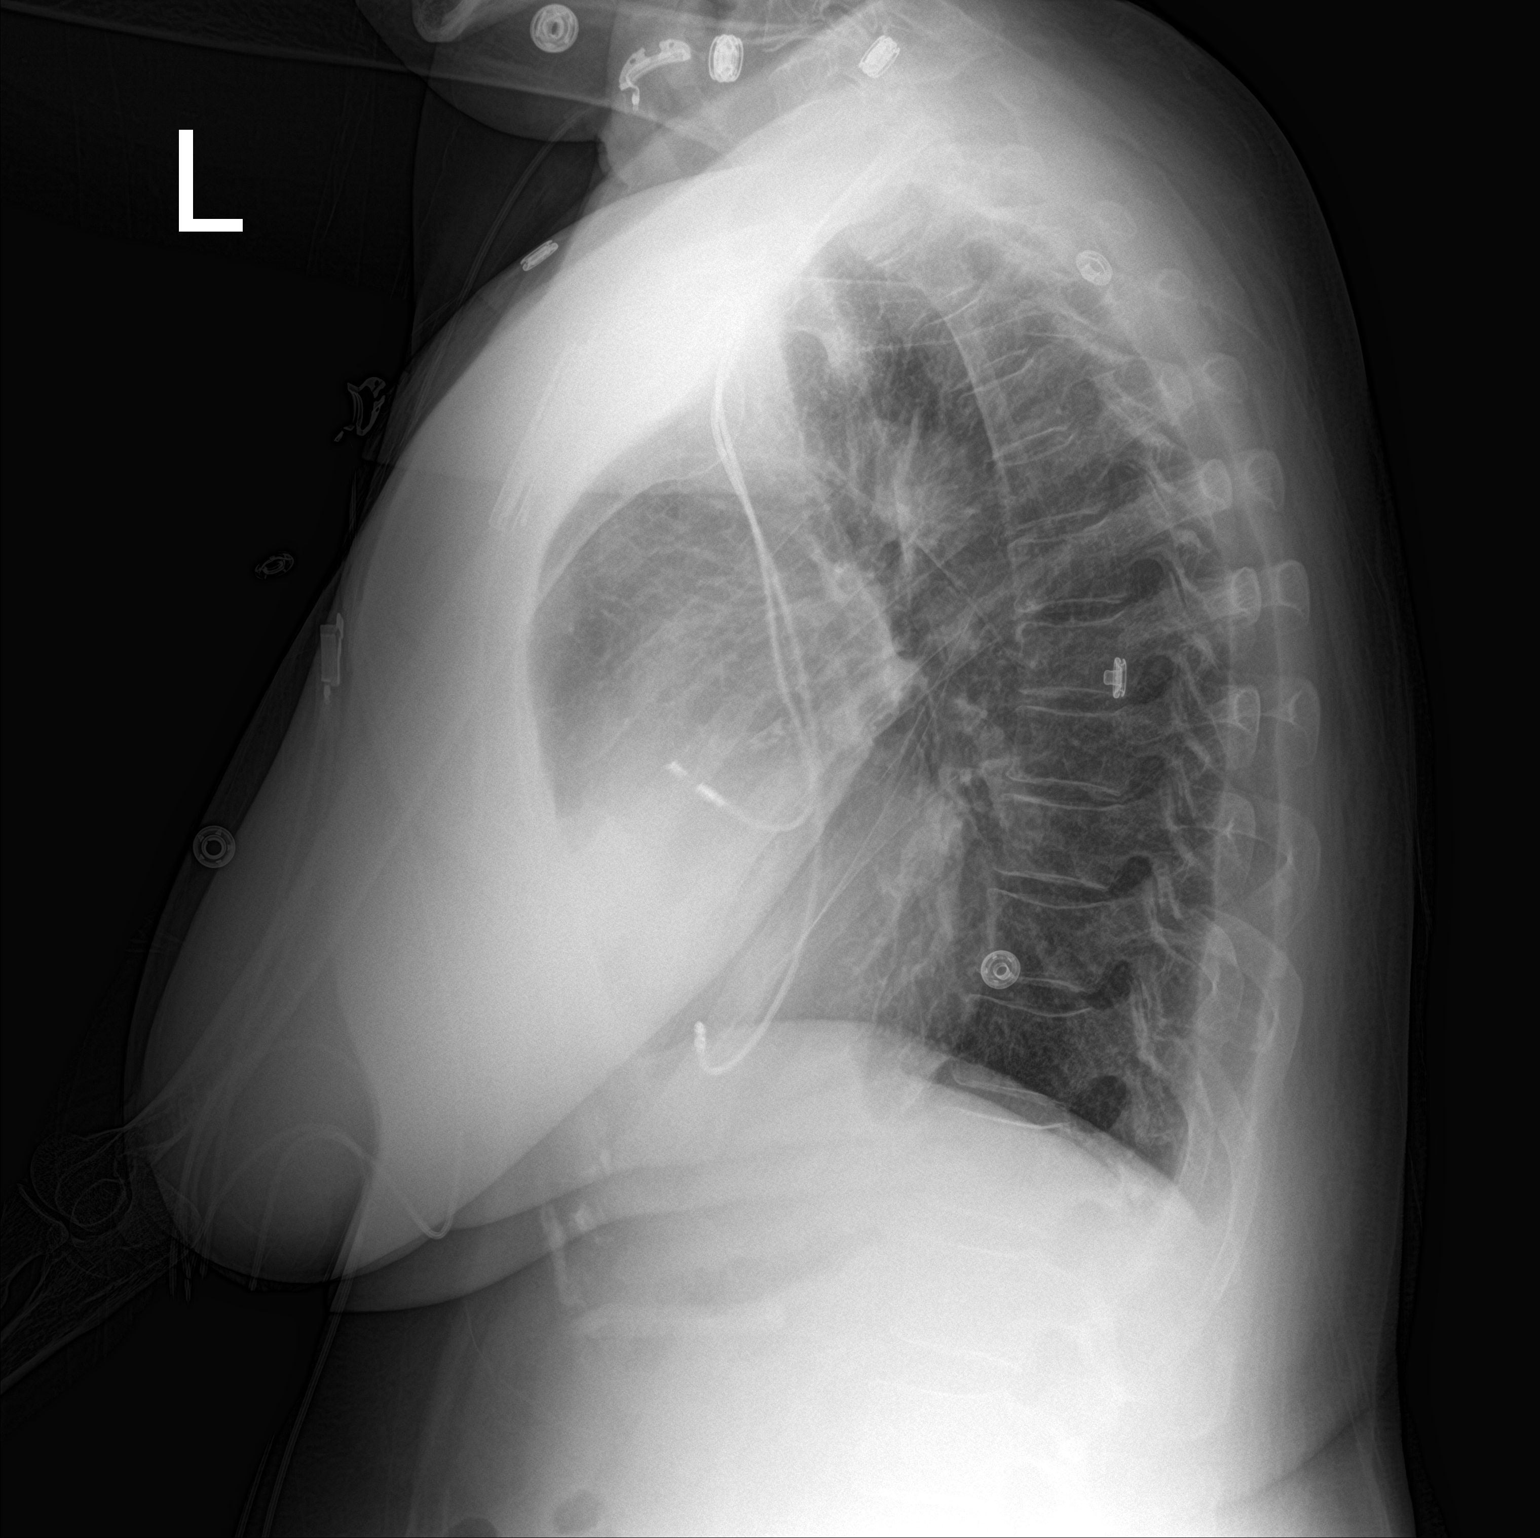

[2 of 2 positions shown; findings below may reference images not displayed]

FINDINGS: Lungs are clear. No pneumothorax or pleural effusion. Left
subclavian dual lead pacemaker has been place with leads overlying
the right atrium and right ventricle. Cardiac size within normal
limits. Pulmonary vascularity is normal. No acute bone abnormality.
IMPRESSION: Left subclavian dual lead pacemaker with leads within the right
atrium and right ventricle. No pneumothorax.

## 2022-01-04 ENCOUNTER — Encounter: Payer: Self-pay | Admitting: Cardiology

## 2022-01-06 ENCOUNTER — Ambulatory Visit (INDEPENDENT_AMBULATORY_CARE_PROVIDER_SITE_OTHER): Payer: 59

## 2022-01-06 DIAGNOSIS — I442 Atrioventricular block, complete: Secondary | ICD-10-CM | POA: Diagnosis not present

## 2022-01-10 LAB — CUP PACEART REMOTE DEVICE CHECK
Battery Remaining Longevity: 94 mo
Battery Remaining Percentage: 92 %
Battery Voltage: 3.01 V
Brady Statistic AP VP Percent: 11 %
Brady Statistic AP VS Percent: 39 %
Brady Statistic AS VP Percent: 1 %
Brady Statistic AS VS Percent: 49 %
Brady Statistic RA Percent Paced: 50 %
Brady Statistic RV Percent Paced: 11 %
Date Time Interrogation Session: 20230418111005
Implantable Lead Implant Date: 20220414
Implantable Lead Implant Date: 20220414
Implantable Lead Location: 753859
Implantable Lead Location: 753860
Implantable Lead Model: 1944
Implantable Pulse Generator Implant Date: 20220414
Lead Channel Impedance Value: 430 Ohm
Lead Channel Impedance Value: 550 Ohm
Lead Channel Pacing Threshold Amplitude: 1 V
Lead Channel Pacing Threshold Amplitude: 1.25 V
Lead Channel Pacing Threshold Pulse Width: 0.5 ms
Lead Channel Pacing Threshold Pulse Width: 0.8 ms
Lead Channel Sensing Intrinsic Amplitude: 1 mV
Lead Channel Sensing Intrinsic Amplitude: 12 mV
Lead Channel Setting Pacing Amplitude: 2.5 V
Lead Channel Setting Pacing Amplitude: 2.5 V
Lead Channel Setting Pacing Pulse Width: 0.5 ms
Lead Channel Setting Sensing Sensitivity: 2 mV
Pulse Gen Model: 2272
Pulse Gen Serial Number: 3920245

## 2022-01-11 ENCOUNTER — Ambulatory Visit: Payer: 59 | Admitting: Cardiology

## 2022-01-24 NOTE — Progress Notes (Signed)
Remote pacemaker transmission.   

## 2022-01-27 ENCOUNTER — Ambulatory Visit: Payer: 59 | Admitting: Cardiology

## 2022-01-27 ENCOUNTER — Encounter: Payer: Self-pay | Admitting: Student

## 2022-01-27 ENCOUNTER — Ambulatory Visit: Payer: 59 | Admitting: Student

## 2022-01-27 VITALS — BP 145/83 | HR 72 | Resp 16 | Ht 63.0 in | Wt 222.8 lb

## 2022-01-27 DIAGNOSIS — I50812 Chronic right heart failure: Secondary | ICD-10-CM

## 2022-01-27 DIAGNOSIS — I480 Paroxysmal atrial fibrillation: Secondary | ICD-10-CM

## 2022-01-27 DIAGNOSIS — Z951 Presence of aortocoronary bypass graft: Secondary | ICD-10-CM

## 2022-01-27 DIAGNOSIS — I5020 Unspecified systolic (congestive) heart failure: Secondary | ICD-10-CM

## 2022-01-27 MED ORDER — METOPROLOL SUCCINATE ER 25 MG PO TB24: 50.0000 mg | ORAL_TABLET | Freq: Every day | ORAL | 3 refills | Status: DC

## 2022-01-27 NOTE — Progress Notes (Signed)
? ?Follow up visit ? ?Subjective:  ? ?Colleen [name], female    DOB: 10-18-1965, 56 y.o.   MRN: [id number] ? ? ?HPI ? ? ? ?Chief Complaint  ?Patient presents with  ? Follow-up  ?  3 MONTH  ? Abnormal ECG  ? POST OP  ? ? ?56 y.o. Caucasian female  with hypertension, type 1 diabetes mellitus, CAD, late presentation inferior MI (03/16), complicated by complete AV block and AKI/CKD, RV failure, resolution of AV block and AKI on discharge, followed by repeat admission with asystole cardiac arrest (12/2020), now s/p PPM.  ? ?Patient was seen by [place] interventional cardiologist in 03/2021 for second opinion, she was subsequently referred to cardiothoracic surgery at [facility] and underwent CABG x3, LAA clip, maze on 12/05/2021.  Patient did develop sternal incision infection, which was treated with doxycycline and Keflex.  She has since been slowly recovering.  Patient now presents for follow-up with our office. ? ?Patient reports she is currently taking torsemide 20 mg once daily and follows DASH diet strictly.  She has slowly been increasing physical activity.  Patient is scheduled to see nephrology in 2 weeks. ? ?Current Outpatient Medications on File Prior to Visit  ?Medication Sig Dispense Refill  ? albuterol (PROVENTIL HFA;VENTOLIN HFA) 108 (90 Base) MCG/ACT inhaler Inhale 1-2 puffs into the lungs every 6 (six) hours as needed for wheezing or shortness of breath.    ? apixaban (ELIQUIS) 5 MG TABS tablet Take 1 tablet (5 mg total) by mouth 2 (two) times daily. (Patient taking differently: Take 2.5 mg by mouth 2 (two) times daily.) 180 tablet 3  ? aspirin EC 81 MG tablet Take 81 mg by mouth daily. Swallow whole.    ? Cholecalciferol (VITAMIN D) 2000 units tablet Take 2,000 Units by mouth daily.    ? insulin lispro (HUMALOG) 100 UNIT/ML injection Inject into the skin See admin instructions. Insulin Pump - Mini Medtronic 670G    ? loratadine (CLARITIN) 10 MG tablet Take 1 tablet by mouth daily as needed for allergies.    ?  nitroGLYCERIN (NITROSTAT) 0.4 MG SL tablet Place 1 tablet (0.4 mg total) under the tongue every 5 (five) minutes x 3 doses as needed for chest pain. 25 tablet 1  ? rosuvastatin (CRESTOR) 10 MG tablet Take 1 tablet (10 mg total) by mouth at bedtime. 90 tablet 3  ? tobramycin (TOBREX) 0.3 % ophthalmic solution Place 2 drops into both eyes as needed (prior to injections).     ? torsemide (DEMADEX) 20 MG tablet TAKE 1 TABLET BY MOUTH  TWICE DAILY AS NEEDED FOR  LEG SWELLING 120 tablet 5  ? ?No current facility-administered medications on file prior to visit.  ? ? ?Cardiovascular & other pertient studies: ?EKG 01/27/2022:  ?Sinus rhythm with first-degree AV block at a rate of 71 bpm.  Normal axis.  Diffuse nonspecific T wave abnormality.  Inferior infarct, age indeterminate.  Low voltage complexes. ? ?CABG x 3 12/05/2021: ?LIMA-LAD, SVG-RCA, SVG-OM2 ?LAA Clip ?MAZE ? ?Echocardiogram 02/25/2021: ?Left ventricle cavity is normal in size. Normal left ventricular wall thickness. Moderate global hypokinesis. LVEF 40-45%.  Doppler evidence of grade II (pseudonormal) diastolic dysfunction, elevated LAP.  ?Left atrial cavity is moderately dilated. ?Right atrial cavity is mildly dilated. ?Right ventricle cavity is moderately dilated. Mildly reduced right ventricular function. ?Mild to moderate mitral regurgitation. ?Mild to moderate tricuspid regurgitation.  ?Estimated pulmonary artery systolic pressure 38 mmHg. ?Personally reviewed and compared all prior and current echocardiograms. LVEF is 40-45% and  appears relatively unchanged. Pericardial effusion now absent. Estimated PASP increased from 21 to 38 mmHg. Pacemaker lead now seen.  ? ?Lexiscan Sestamibi stress test 02/07/2021: ?Lexiscan nuclear stress test performed using 1-day protocol. SPECT images show small sized, mild intensity fixed perfusion defect in basal inferoseptal myocardium, and a medium sized, severe intensity, predominantly fixed perfusion defect in basal to mid  inferior/inferolateral myocardium with only minimal reversibility. There is absence of thickening seen in inferolateral myocardium. Stress LVEF 44%. ?Findings suggest old RCA/PDA infarct with minimal peri-infarct ischemia. ?High risk study due to low LVEF and old infarct.  ? ?EKG 01/14/2021: ?Sinus rhythm 66 bpm ?First degree A-V block  ?Low voltage in precordial leads.  ?Inferior infarct -age undetermined  ? ?Recent labs: ?01/03/2022: ?Sodium 141, potassium 4.5, BUN 50, creatinine 1.6, GFR 37 ?Hgb 10.4, HCT 33.7, MCV 94 ? ?03/16/2021: ?Glucose 152, BUN/Cr 30/1.33. EGFR 47. Na/K 139/5.0.  ? ?02/15/2021: ?Glucose 153, BUN/Cr 40/1.67. EGFR 36. Na/K 1.34/39 ? ?01/12/2021: ?Glucose 126, BUN/Cr 43/1.43. EGFR 30. Na/K 134/4.8.  ?H/H 9.2/28.7. MCV 94. Platelets 199 ?HbA1C 7.5% ?Chol 118, TG 210, HDL 42, LDL 34 ? ?01/02/2021: ?AST/ALT 45/81 ?T.bili 1.4 ?AlKP 153 ? ?11/2020: ?TSH 1.8 normal ? ? ?Review of Systems  ?Cardiovascular:  Positive for dyspnea on exertion (improving) and leg swelling. Negative for chest pain, palpitations and syncope.  ? ?   ? ? ?Vitals:  ? 01/27/22 1323 01/27/22 1324  ?BP: (!) 150/79 (!) 145/83  ?Pulse: 72 72  ?Resp: 16   ?SpO2: 99%   ? ? ?Body mass index is 39.47 kg/m?. ?Filed Weights  ? 01/27/22 1323  ?Weight: 222 lb 12.8 oz (101.1 kg)  ? ? ? ?Objective:  ? Physical Exam ?Vitals and nursing note reviewed.  ?Constitutional:   ?   General: She is not in acute distress. ?Neck:  ?   Vascular: No JVD.  ?Cardiovascular:  ?   Rate and Rhythm: Normal rate and regular rhythm.  ?   Heart sounds: Normal heart sounds. No murmur heard. ?Pulmonary:  ?   Effort: Pulmonary effort is normal.  ?   Breath sounds: Normal breath sounds. No wheezing or rales.  ?Musculoskeletal:  ?   Right lower leg: Edema (1+) present.  ?   Left lower leg: Edema (1+) present.  ? ? ? ? ? ?   ?Assessment & Recommendations:  ? ?56 y.o. Caucasian female  with hypertension, type 1 diabetes mellitus, CAD, late presentation inferior MI (0/9407),  complicated by complete AV block and AKI/CKD, RV failure, resolution of AV block and AKI on discharge, followed by repeat admission with asystole cardiac arrest (12/2020), now s/p PPM, anemia ? ?CAD: ?Late presentation inferior STEMI with occluded RCA.  She also has moderate proximal/mid LAD disease, and left circumflex CTO (11/2020). ?Following second opinion from [facility] interventional cardiologist she was referred to cardiothoracic surgery and underwent CABG x3 in 11/2021. ?Continue aspirin and Eliquis ?Continue metoprolol, rosuvastatin ? ?HFrEF, right-sided heart failure: ?Ischemic cardiomyopathy.   ?LVEF 68-08%, mild RV systolic dysfunction. ?NYHA class II symptoms ?Due to renal insufficiency [name] has been discontinued ?Continue metoprolol ?Continue torsemide 20 mg p.o. daily ?Not on Farxiga given type 1 DM ?Recommend uptitration of guideline directed medical therapy if renal function and hemodynamics allow.  Patient is seeing nephrologist in 2 weeks, will reevaluate whether [name] can be restarted following nephrology appointment. ? ?Complete AV block: ?Intermittent, now s/p PPM ?Currently in sinus rhythm with first-degree AV block. ?Continues to follow with EP ? ?CKD 3a: ?Follows with  Dr. Hollie Salk ?She was seen creatinine 1.6 ?Uptitration of guideline directed medical therapy is currently limited by renal function.  We will continue to follow closely and uptitrate heart failure therapy as tolerated. ? ?Type 1 DM: ?Continue insulin pump.  Follow-up with Dr. Chalmers Cater ? ?Anemia: ?Likely anemia of chronic disease, stable ? ?I personally reviewed external records including provider notes, laboratory results, radiologic imaging, and procedures. ? ?Follow-up in 8 weeks, sooner if needed. ? ? This was a 50-minute encounter with face-to-face counseling, medical records review, coordination of care, explanation of complex medical issues, complex medical decision making.  ? ? ? ?Alethia Berthold, PA-C ?01/29/2022, 9:18  AM ?Office: [phone] ? ?

## 2022-01-31 ENCOUNTER — Telehealth: Payer: Self-pay | Admitting: Student

## 2022-01-31 DIAGNOSIS — I5020 Unspecified systolic (congestive) heart failure: Secondary | ICD-10-CM

## 2022-01-31 DIAGNOSIS — Z951 Presence of aortocoronary bypass graft: Secondary | ICD-10-CM

## 2022-01-31 NOTE — Telephone Encounter (Signed)
ICD-10-CM   ?1. Hx of CABG  Z95.1 AMB referral to cardiac rehabilitation  ?  ?2. HFrEF (heart failure with reduced ejection fraction) (HCC)  I50.20 AMB referral to cardiac rehabilitation  ?  ? ?Orders Placed This Encounter  ?Procedures  ? AMB referral to cardiac rehabilitation  ?  Referral Priority:   Routine  ?  Referral Type:   Consultation  ?  Number of Visits Requested:   1  ? ? ?

## 2022-02-10 ENCOUNTER — Telehealth (HOSPITAL_COMMUNITY): Payer: Self-pay

## 2022-02-10 ENCOUNTER — Encounter (HOSPITAL_COMMUNITY): Payer: Self-pay | Admitting: *Deleted

## 2022-02-10 NOTE — Progress Notes (Signed)
Received referral from Dr. Einar Gip for this pt s/p 12/05/21 CABG x 3 at [facility] to participate in Cardiac rehab.  Clinical review of pt follow up appt on 01/27/22  with Lawerance Cruel PA with Dr. Einar Gip - cardiologist office note. Also reviewed  post op notes with Dr Morton Peters at [facility] in [place]. Pt is making the expected progress in recovery.  Pt appropriate for scheduling for on site cardiac rehab and/or enrollment in Virtual Cardiac Rehab.  Pt Covid Risk Score is 5  Will forward to staff for follow up. Cherre Huger, BSN Cardiac and Training and development officer

## 2022-02-10 NOTE — Telephone Encounter (Signed)
Called and spoke with pt in regards to CR, pt stated she is doing well and is not interested.   Closed referral

## 2022-02-21 ENCOUNTER — Other Ambulatory Visit: Payer: Self-pay | Admitting: Cardiology

## 2022-02-21 DIAGNOSIS — I50812 Chronic right heart failure: Secondary | ICD-10-CM

## 2022-02-21 DIAGNOSIS — I5020 Unspecified systolic (congestive) heart failure: Secondary | ICD-10-CM

## 2022-03-13 ENCOUNTER — Other Ambulatory Visit: Payer: Self-pay | Admitting: Cardiology

## 2022-03-13 ENCOUNTER — Encounter: Payer: Self-pay | Admitting: Student

## 2022-03-13 ENCOUNTER — Ambulatory Visit: Payer: 59 | Admitting: Student

## 2022-03-13 VITALS — BP 156/81 | HR 65 | Temp 97.8°F | Resp 17 | Ht 63.0 in | Wt 210.6 lb

## 2022-03-13 DIAGNOSIS — I2510 Atherosclerotic heart disease of native coronary artery without angina pectoris: Secondary | ICD-10-CM

## 2022-03-13 DIAGNOSIS — I480 Paroxysmal atrial fibrillation: Secondary | ICD-10-CM

## 2022-03-13 DIAGNOSIS — I50813 Acute on chronic right heart failure: Secondary | ICD-10-CM

## 2022-03-13 DIAGNOSIS — Z951 Presence of aortocoronary bypass graft: Secondary | ICD-10-CM

## 2022-03-13 DIAGNOSIS — I5020 Unspecified systolic (congestive) heart failure: Secondary | ICD-10-CM

## 2022-03-13 MED ORDER — ENTRESTO 24-26 MG PO TABS: 1.0000 | ORAL_TABLET | Freq: Two times a day (BID) | ORAL | 3 refills | Status: DC

## 2022-03-13 NOTE — Progress Notes (Incomplete)
Follow up visit  Subjective:   Colleen [name], female    DOB: 07-03-1966, 56 y.o.   MRN: [id number]   HPI    Chief Complaint  Patient presents with  . paf  . Coronary Artery Disease  . hf    8 weeks    56 y.o. Caucasian female  with hypertension, type 1 diabetes mellitus, CAD, late presentation inferior MI (0/2585), complicated by complete AV block and AKI/CKD, RV failure, resolution of AV block and AKI on discharge, followed by repeat admission with asystole cardiac arrest (12/2020), now s/p PPM.   Patient was seen by [place] interventional cardiologist in 03/2021 for second opinion, she was subsequently referred to cardiothoracic surgery at [facility] and underwent CABG x3, LAA clip, maze on 12/05/2021.  Patient did develop sternal incision infection, which was treated with doxycycline and Keflex.    Patient was last seen in the office 01/27/2022 at which time she was fairly stable from a cardiovascular standpoint referred to cardiac rehab.  Notably at last office visit follow-up on restarting Entresto until patient had seen Dr. Hollie Salk. Patient now presents for 8 week follow up. ***  ***Reduced stamina over the last 2-3 weeks. After exertion and when sitting having painful throbbing along the spine. Worst episode lasted about 30-40 seconds 4-5 days ago. Neph okay to start Entresto.   She has since been slowly recovering.  Patient now presents for follow-up with our office.  Patient reports she is currently taking torsemide 20 mg once daily and follows DASH diet strictly.  She has slowly been increasing physical activity.  Patient is scheduled to see nephrology in 2 weeks.  Current Outpatient Medications on File Prior to Visit  Medication Sig Dispense Refill  . albuterol (PROVENTIL HFA;VENTOLIN HFA) 108 (90 Base) MCG/ACT inhaler Inhale 1-2 puffs into the lungs every 6 (six) hours as needed for wheezing or shortness of breath.    Marland Kitchen apixaban (ELIQUIS) 5 MG TABS tablet Take 1 tablet (5 mg  total) by mouth 2 (two) times daily. (Patient taking differently: Take 2.5 mg by mouth 2 (two) times daily.) 180 tablet 3  . aspirin EC 81 MG tablet Take 81 mg by mouth daily. Swallow whole.    . Cholecalciferol (VITAMIN D) 2000 units tablet Take 2,000 Units by mouth daily.    . insulin lispro (HUMALOG) 100 UNIT/ML injection Inject into the skin See admin instructions. Insulin Pump - Mini Medtronic 670G    . loratadine (CLARITIN) 10 MG tablet Take 1 tablet by mouth daily as needed for allergies.    . metoprolol succinate (TOPROL-XL) 25 MG 24 hr tablet TAKE ONE-HALF TABLET BY  MOUTH DAILY 45 tablet 3  . nitroGLYCERIN (NITROSTAT) 0.4 MG SL tablet Place 1 tablet (0.4 mg total) under the tongue every 5 (five) minutes x 3 doses as needed for chest pain. 25 tablet 1  . rosuvastatin (CRESTOR) 10 MG tablet Take 1 tablet (10 mg total) by mouth at bedtime. 90 tablet 3  . tobramycin (TOBREX) 0.3 % ophthalmic solution Place 2 drops into both eyes as needed (prior to injections).     . torsemide (DEMADEX) 20 MG tablet TAKE 1 TABLET BY MOUTH  TWICE DAILY AS NEEDED FOR  LEG SWELLING 120 tablet 5   No current facility-administered medications on file prior to visit.    Cardiovascular & other pertient studies: EKG 01/27/2022:  Sinus rhythm with first-degree AV block at a rate of 71 bpm.  Normal axis.  Diffuse nonspecific T wave abnormality.  Inferior infarct, age indeterminate.  Low voltage complexes.  CABG x 3 12/05/2021: LIMA-LAD, SVG-RCA, SVG-OM2 LAA Clip MAZE  Echocardiogram 02/25/2021: Left ventricle cavity is normal in size. Normal left ventricular wall thickness. Moderate global hypokinesis. LVEF 40-45%.  Doppler evidence of grade II (pseudonormal) diastolic dysfunction, elevated LAP.  Left atrial cavity is moderately dilated. Right atrial cavity is mildly dilated. Right ventricle cavity is moderately dilated. Mildly reduced right ventricular function. Mild to moderate mitral regurgitation. Mild to  moderate tricuspid regurgitation.  Estimated pulmonary artery systolic pressure 38 mmHg. Personally reviewed and compared all prior and current echocardiograms. LVEF is 40-45% and appears relatively unchanged. Pericardial effusion now absent. Estimated PASP increased from 21 to 38 mmHg. Pacemaker lead now seen.   Lexiscan Sestamibi stress test 02/07/2021: Lexiscan nuclear stress test performed using 1-day protocol. SPECT images show small sized, mild intensity fixed perfusion defect in basal inferoseptal myocardium, and a medium sized, severe intensity, predominantly fixed perfusion defect in basal to mid inferior/inferolateral myocardium with only minimal reversibility. There is absence of thickening seen in inferolateral myocardium. Stress LVEF 44%. Findings suggest old RCA/PDA infarct with minimal peri-infarct ischemia. High risk study due to low LVEF and old infarct.   EKG 01/14/2021: Sinus rhythm 66 bpm First degree A-V block  Low voltage in precordial leads.  Inferior infarct -age undetermined   Recent labs: 01/03/2022: Sodium 141, potassium 4.5, BUN 50, creatinine 1.6, GFR 37 Hgb 10.4, HCT 33.7, MCV 94  03/16/2021: Glucose 152, BUN/Cr 30/1.33. EGFR 47. Na/K 139/5.0.   02/15/2021: Glucose 153, BUN/Cr 40/1.67. EGFR 36. Na/K 1.34/39  01/12/2021: Glucose 126, BUN/Cr 43/1.43. EGFR 30. Na/K 134/4.8.  H/H 9.2/28.7. MCV 94. Platelets 199 HbA1C 7.5% Chol 118, TG 210, HDL 42, LDL 34  01/02/2021: AST/ALT 45/81 T.bili 1.4 AlKP 153  11/2020: TSH 1.8 normal   Review of Systems  Cardiovascular:  Positive for dyspnea on exertion (improving) and leg swelling. Negative for chest pain, palpitations and syncope.         There were no vitals filed for this visit.   There is no height or weight on file to calculate BMI. There were no vitals filed for this visit.    Objective:   Physical Exam Vitals and nursing note reviewed.  Constitutional:      General: She is not in acute  distress. Neck:     Vascular: No JVD.  Cardiovascular:     Rate and Rhythm: Normal rate and regular rhythm.     Heart sounds: Normal heart sounds. No murmur heard. Pulmonary:     Effort: Pulmonary effort is normal.     Breath sounds: Normal breath sounds. No wheezing or rales.  Musculoskeletal:     Right lower leg: Edema (1+) present.     Left lower leg: Edema (1+) present.          Assessment & Recommendations:   56 y.o. Caucasian female  with hypertension, type 1 diabetes mellitus, CAD, late presentation inferior MI (01/1760), complicated by complete AV block and AKI/CKD, RV failure, resolution of AV block and AKI on discharge, followed by repeat admission with asystole cardiac arrest (12/2020), now s/p PPM, anemia *** CAD: Late presentation inferior STEMI with occluded RCA.  She also has moderate proximal/mid LAD disease, and left circumflex CTO (11/2020). Following second opinion from [facility] interventional cardiologist she was referred to cardiothoracic surgery and underwent CABG x3 in 11/2021. Continue aspirin and Eliquis Continue metoprolol, rosuvastatin  HFrEF, right-sided heart failure: Ischemic cardiomyopathy.   LVEF 40-45%, mild RV  systolic dysfunction. NYHA class II symptoms Due to renal insufficiency [name] has been discontinued Continue metoprolol Continue torsemide 20 mg p.o. daily Not on Farxiga given type 1 DM Recommend uptitration of guideline directed medical therapy if renal function and hemodynamics allow.  Patient is seeing nephrologist in 2 weeks, will reevaluate whether [name] can be restarted following nephrology appointment.  Complete AV block: Intermittent, now s/p PPM Currently in sinus rhythm with first-degree AV block. Continues to follow with EP  CKD 3a: Follows with Dr. Hollie Salk She was seen creatinine 1.6 Uptitration of guideline directed medical therapy is currently limited by renal function.  We will continue to follow closely and uptitrate  heart failure therapy as tolerated.  Type 1 DM: Continue insulin pump.  Follow-up with Dr. Chalmers Cater  Anemia: Likely anemia of chronic disease, stable  I personally reviewed external records including provider notes, laboratory results, radiologic imaging, and procedures.  Follow-up in 8 weeks, sooner if needed.   This was a 50-minute encounter with face-to-face counseling, medical records review, coordination of care, explanation of complex medical issues, complex medical decision making.     Alethia Berthold, PA-C 03/13/2022, 10:02 AM Office: [phone]

## 2022-03-24 LAB — BASIC METABOLIC PANEL
BUN/Creatinine Ratio: 33 — ABNORMAL HIGH (ref 9–23)
BUN: 54 mg/dL — ABNORMAL HIGH (ref 6–24)
CO2: 21 mmol/L (ref 20–29)
Calcium: 9 mg/dL (ref 8.7–10.2)
Chloride: 98 mmol/L (ref 96–106)
Creatinine, Ser: 1.66 mg/dL — ABNORMAL HIGH (ref 0.57–1.00)
Glucose: 184 mg/dL — ABNORMAL HIGH (ref 70–99)
Potassium: 4.7 mmol/L (ref 3.5–5.2)
Sodium: 137 mmol/L (ref 134–144)
eGFR: 36 mL/min/{1.73_m2} — ABNORMAL LOW (ref >59)

## 2022-03-24 NOTE — Progress Notes (Signed)
Called pt and is informed

## 2022-03-24 NOTE — Addendum Note (Signed)
Addended by: Lawerance Cruel L on: 03/24/2022 01:12 PM   Modules accepted: Orders

## 2022-03-31 NOTE — Progress Notes (Signed)
External Labs 02/02/2022: BUN 30, creatinine 1.32, GFR 47, sodium 134, potassium 4.5 Hgb 10.9, HCT 33.6, MCV 86, platelet 212

## 2022-04-05 ENCOUNTER — Ambulatory Visit: Payer: 59

## 2022-04-05 DIAGNOSIS — I5020 Unspecified systolic (congestive) heart failure: Secondary | ICD-10-CM

## 2022-04-07 ENCOUNTER — Ambulatory Visit (INDEPENDENT_AMBULATORY_CARE_PROVIDER_SITE_OTHER): Payer: 59

## 2022-04-07 DIAGNOSIS — I442 Atrioventricular block, complete: Secondary | ICD-10-CM | POA: Diagnosis not present

## 2022-04-07 LAB — CUP PACEART REMOTE DEVICE CHECK
Battery Remaining Longevity: 84 mo
Battery Remaining Percentage: 89 %
Battery Voltage: 2.99 V
Brady Statistic AP VP Percent: 42 %
Brady Statistic AP VS Percent: 27 %
Brady Statistic AS VP Percent: 2.9 %
Brady Statistic AS VS Percent: 28 %
Brady Statistic RA Percent Paced: 69 %
Brady Statistic RV Percent Paced: 45 %
Date Time Interrogation Session: 20230714020013
Implantable Lead Implant Date: 20220414
Implantable Lead Implant Date: 20220414
Implantable Lead Location: 753859
Implantable Lead Location: 753860
Implantable Lead Model: 1944
Implantable Pulse Generator Implant Date: 20220414
Lead Channel Impedance Value: 430 Ohm
Lead Channel Impedance Value: 510 Ohm
Lead Channel Pacing Threshold Amplitude: 1 V
Lead Channel Pacing Threshold Amplitude: 1.25 V
Lead Channel Pacing Threshold Pulse Width: 0.5 ms
Lead Channel Pacing Threshold Pulse Width: 0.8 ms
Lead Channel Sensing Intrinsic Amplitude: 12 mV
Lead Channel Sensing Intrinsic Amplitude: 2.2 mV
Lead Channel Setting Pacing Amplitude: 2.5 V
Lead Channel Setting Pacing Amplitude: 2.5 V
Lead Channel Setting Pacing Pulse Width: 0.5 ms
Lead Channel Setting Sensing Sensitivity: 2 mV
Pulse Gen Model: 2272
Pulse Gen Serial Number: 3920245

## 2022-04-08 LAB — BASIC METABOLIC PANEL
BUN/Creatinine Ratio: 22 (ref 9–23)
BUN: 29 mg/dL — ABNORMAL HIGH (ref 6–24)
CO2: 23 mmol/L (ref 20–29)
Calcium: 9.1 mg/dL (ref 8.7–10.2)
Chloride: 107 mmol/L — ABNORMAL HIGH (ref 96–106)
Creatinine, Ser: 1.29 mg/dL — ABNORMAL HIGH (ref 0.57–1.00)
Glucose: 109 mg/dL — ABNORMAL HIGH (ref 70–99)
Potassium: 4.3 mmol/L (ref 3.5–5.2)
Sodium: 140 mmol/L (ref 134–144)
eGFR: 48 mL/min/{1.73_m2} — ABNORMAL LOW (ref >59)

## 2022-04-10 ENCOUNTER — Encounter: Payer: Self-pay | Admitting: Cardiology

## 2022-04-10 NOTE — Progress Notes (Signed)
Renal function has improved with stopping Entresto. Will refrain from restarting at this time.

## 2022-04-10 NOTE — Progress Notes (Signed)
Patient aware.

## 2022-04-12 ENCOUNTER — Encounter: Payer: Self-pay | Admitting: Cardiology

## 2022-04-12 ENCOUNTER — Ambulatory Visit: Payer: 59 | Admitting: Cardiology

## 2022-04-12 VITALS — BP 159/88 | HR 62 | Temp 98.0°F | Resp 16 | Ht 63.0 in | Wt 212.0 lb

## 2022-04-12 DIAGNOSIS — I480 Paroxysmal atrial fibrillation: Secondary | ICD-10-CM

## 2022-04-12 DIAGNOSIS — I5020 Unspecified systolic (congestive) heart failure: Secondary | ICD-10-CM

## 2022-04-12 DIAGNOSIS — I25118 Atherosclerotic heart disease of native coronary artery with other forms of angina pectoris: Secondary | ICD-10-CM

## 2022-04-12 MED ORDER — LOSARTAN POTASSIUM 25 MG PO TABS: 25.0000 mg | ORAL_TABLET | Freq: Every day | ORAL | 3 refills | Status: DC

## 2022-04-12 NOTE — Progress Notes (Signed)
Follow up visit  Subjective:   Colleen [name], female    DOB: 1965/11/01, 56 y.o.   MRN: [id number]   HPI    Chief Complaint  Patient presents with   Congestive Heart Failure   Coronary Artery Disease   Follow-up    56 y.o. Caucasian female  with hypertension, type 1 diabetes mellitus, CAD with RCA and Lcx CTOs, late presentation inferior MI (05/9370), complicated by complete AV block and AKI/CKD, RV failure, resolution of AV block and AKI on discharge, followed by repeat admission with asystole cardiac arrest (12/2020), now s/p PPM, s/p CABGX3 (LIMA-LAD, SVG-RCA, SVG-OM), LAA clip, Maze at [facility] (02/9677), complicated by sternal incision infection, subsequent AI while on Entresto.  I am seeing the patient for the first time since 03/2021. In the interim, she had seen our PA Lawerance Cruel, Cardiology/ interventional cardiology and cardiac surgery at [facility]. Today, patient complains of exertional dyspnea, fatigue. She has not improved post-op, as much as she would have hoped. As always, she denies any chest pain.   I reviewed the echocardiogram results and compared with the pre-op echocardiogram. Suprisingly, echocardiogram at [facility] had reported normal RV systolic function pre-op, whereas none of the prior echocardiograms performed here had showed normal RV systolic function.   I reviewed pacemaker interrogation performed aby Dr/ Lambert's office. RV pacing was snoted to be 45%/ Patient wonders if this would be    Current Outpatient Medications:    albuterol (PROVENTIL HFA;VENTOLIN HFA) 108 (90 Base) MCG/ACT inhaler, Inhale 1-2 puffs into the lungs every 6 (six) hours as needed for wheezing or shortness of breath., Disp: , Rfl:    apixaban (ELIQUIS) 5 MG TABS tablet, Take 1 tablet (5 mg total) by mouth 2 (two) times daily. (Patient taking differently: Take 2.5 mg by mouth 2 (two) times daily.), Disp: 180 tablet, Rfl: 3   aspirin EC 81 MG tablet, Take 81 mg by mouth daily. Swallow whole.,  Disp: , Rfl:    Cholecalciferol (VITAMIN D) 2000 units tablet, Take 2,000 Units by mouth daily., Disp: , Rfl:    insulin lispro (HUMALOG) 100 UNIT/ML injection, Inject into the skin See admin instructions. Insulin Pump - Mini Medtronic 670G, Disp: , Rfl:    loratadine (CLARITIN) 10 MG tablet, Take 1 tablet by mouth daily as needed for allergies., Disp: , Rfl:    metoprolol succinate (TOPROL-XL) 25 MG 24 hr tablet, TAKE ONE-HALF TABLET BY  MOUTH DAILY (Patient taking differently: 50 mg daily.), Disp: 45 tablet, Rfl: 3   nitroGLYCERIN (NITROSTAT) 0.4 MG SL tablet, Place 1 tablet (0.4 mg total) under the tongue every 5 (five) minutes x 3 doses as needed for chest pain., Disp: 25 tablet, Rfl: 1   rosuvastatin (CRESTOR) 10 MG tablet, Take 1 tablet (10 mg total) by mouth at bedtime., Disp: 90 tablet, Rfl: 3   tobramycin (TOBREX) 0.3 % ophthalmic solution, Place 2 drops into both eyes as needed (prior to injections). , Disp: , Rfl:    torsemide (DEMADEX) 20 MG tablet, TAKE 1 TABLET BY MOUTH  TWICE DAILY AS NEEDED FOR  LEG SWELLING, Disp: 180 tablet, Rfl: 3  Cardiovascular & other pertient studies:  Echocardiogram 04/05/2022:  Left ventricle cavity is normal in size. Mild concentric hypertrophy of  the left ventricle. Abnormal septal wall motion due to post-operative  coronary artery bypass graft. Mild global hypokinesis. LVEF 50-55%.  Doppler evidence of grade II (pseudonormal) diastolic dysfunction,  elevated LAP.  Right ventricle cavity not well visualized, but at least  mildly dilated  with at least mildly reduced systolic function.  Device lead is seen.  Left atrial cavity is mildly dilated.  Right atrial cavity is mild to moderately dilated.  Mild (Grade I) mitral regurgitation.  Moderate tricuspid regurgitation. Estimated pulmonary artery systolic  pressure at least 26 mmHg.  IVC is dilated with a respiratory response of <50%. Estimated right atrial  pressure at least 15 mmHg.  Compared to  previous study on 02/25/2021, LVEF has improved from 35-40%.   CABG note 11/2021 ([facility], Dr. Jimmye Norman): CABG x 3 (LIMA-LAD, SVG-RCA, SVG-OM2), LAA Clip, MAZE =  [facility] echocardiogram 10/2021: MILD LV DYSFUNCTION (See above) WITH MILD LVH  NORMAL RIGHT VENTRICULAR SYSTOLIC FUNCTION  VALVULAR REGURGITATION: MILD MR, TRIVIAL PR, MILD TR  NO VALVULAR STENOSIS  NO PRIOR STUDY FOR COMPARISON   Echocardiogram 02/25/2021: Left ventricle cavity is normal in size. Normal left ventricular wall thickness. Moderate global hypokinesis. LVEF 40-45%.  Doppler evidence of grade II (pseudonormal) diastolic dysfunction, elevated LAP.  Left atrial cavity is moderately dilated. Right atrial cavity is mildly dilated. Right ventricle cavity is moderately dilated. Mildly reduced right ventricular function. Mild to moderate mitral regurgitation. Mild to moderate tricuspid regurgitation.  Estimated pulmonary artery systolic pressure 38 mmHg. Personally reviewed and compared all prior and current echocardiograms. LVEF is 40-45% and appears relatively unchanged. Pericardial effusion now absent. Estimated PASP increased from 21 to 38 mmHg. Pacemaker lead now seen.   Lexiscan Sestamibi stress test 02/07/2021: Lexiscan nuclear stress test performed using 1-day protocol. SPECT images show small sized, mild intensity fixed perfusion defect in basal inferoseptal myocardium, and a medium sized, severe intensity, predominantly fixed perfusion defect in basal to mid inferior/inferolateral myocardium with only minimal reversibility. There is absence of thickening seen in inferolateral myocardium. Stress LVEF 44%. Findings suggest old RCA/PDA infarct with minimal peri-infarct ischemia. High risk study due to low LVEF and old infarct.   Recent labs: 04/07/2022: Glucose 109, BUN/Cr 29/1.29. EGFR 48. Na/K 140/4.3.   01/12/2021: Glucose 126, BUN/Cr 43/1.43. EGFR 30. Na/K 134/4.8.  H/H 9.2/28.7. MCV 94. Platelets 199 HbA1C 7.5% Chol  118, TG 210, HDL 42, LDL 34   Review of Systems  Constitutional: Positive for malaise/fatigue.  Cardiovascular:  Positive for dyspnea on exertion and leg swelling. Negative for chest pain, palpitations and syncope.         Vitals:   04/12/22 1128  BP: (!) 159/88  Pulse: 62  Resp: 16  Temp: 98 F (36.7 C)  SpO2: 98%    Body mass index is 37.55 kg/m. Filed Weights   04/12/22 1128  Weight: 212 lb (96.2 kg)     Objective:   Physical Exam Vitals and nursing note reviewed.  Constitutional:      General: She is not in acute distress. Neck:     Vascular: No JVD.  Cardiovascular:     Rate and Rhythm: Normal rate and regular rhythm.     Heart sounds: Normal heart sounds. No murmur heard. Pulmonary:     Effort: Pulmonary effort is normal.     Breath sounds: Normal breath sounds. No wheezing or rales.  Musculoskeletal:     Right lower leg: Edema (Trace) present.     Left lower leg: Edema (Trace) present.           Assessment & Recommendations:   56 y.o. Caucasian female  with hypertension, type 1 diabetes mellitus, CAD with RCA and Lcx CTOs, late presentation inferior MI (10/9474), complicated by complete AV block and AKI/CKD, RV failure,  resolution of AV block and AKI on discharge, followed by repeat admission with asystole cardiac arrest (12/2020), now s/p PPM, s/p CABGX3 (LIMA-LAD, SVG-RCA, SVG-OM), LAA clip, Maze at [facility] (12/7581), complicated by sternal incision infection, subsequent AI while on Entresto.  CAD, ischemic cardiomyopathy: s/p CABGX3 (LIMA-LAD, SVG-RCA, SVG-OM), LAA clip, Maze at [facility] (11/2021) She still has exertional dyspnea and fatigue. Even though LVRF has nearly normalized, RV is still dysfunctional post CABG I would like her to be on GDMT for HFrEF.  I will try losartan 12.5 mg daily and repeat BMP in 1 week. I have encouraged her to undergo cardiac rehab Continue metoprolol succinate to 25 mg daily. Continue aspirin, statin.  Complete AV  block: 45% V pacing. She questions if this is higher than expected. She has f/u w/EP.   CKD 3a: She has not tolerated Entresto due to AKI. Will monitor renal function closely with introduction of losartan.  Type 1 DM: Continue insulin pump.  Follow-up with Dr. Chalmers Cater  F/u in 3 months   Nigel Mormon, MD Pager: [phone] Office: [phone]

## 2022-04-14 ENCOUNTER — Encounter: Payer: Self-pay | Admitting: Student

## 2022-04-14 ENCOUNTER — Ambulatory Visit (INDEPENDENT_AMBULATORY_CARE_PROVIDER_SITE_OTHER): Payer: 59 | Admitting: Student

## 2022-04-14 VITALS — BP 140/76 | HR 79 | Ht 63.0 in | Wt 214.8 lb

## 2022-04-14 DIAGNOSIS — Z951 Presence of aortocoronary bypass graft: Secondary | ICD-10-CM

## 2022-04-14 DIAGNOSIS — I5020 Unspecified systolic (congestive) heart failure: Secondary | ICD-10-CM

## 2022-04-14 DIAGNOSIS — I480 Paroxysmal atrial fibrillation: Secondary | ICD-10-CM | POA: Diagnosis not present

## 2022-04-14 DIAGNOSIS — I2510 Atherosclerotic heart disease of native coronary artery without angina pectoris: Secondary | ICD-10-CM | POA: Diagnosis not present

## 2022-04-14 DIAGNOSIS — I442 Atrioventricular block, complete: Secondary | ICD-10-CM

## 2022-04-14 LAB — CUP PACEART INCLINIC DEVICE CHECK
Battery Remaining Longevity: 87 mo
Battery Voltage: 2.99 V
Brady Statistic RA Percent Paced: 71 %
Brady Statistic RV Percent Paced: 48 %
Date Time Interrogation Session: 20230721101220
Implantable Lead Implant Date: 20220414
Implantable Lead Implant Date: 20220414
Implantable Lead Location: 753859
Implantable Lead Location: 753860
Implantable Lead Model: 1944
Implantable Pulse Generator Implant Date: 20220414
Lead Channel Impedance Value: 437.5 Ohm
Lead Channel Impedance Value: 575 Ohm
Lead Channel Pacing Threshold Amplitude: 1 V
Lead Channel Pacing Threshold Amplitude: 1 V
Lead Channel Pacing Threshold Amplitude: 1.25 V
Lead Channel Pacing Threshold Amplitude: 1.25 V
Lead Channel Pacing Threshold Pulse Width: 0.5 ms
Lead Channel Pacing Threshold Pulse Width: 0.5 ms
Lead Channel Pacing Threshold Pulse Width: 0.8 ms
Lead Channel Pacing Threshold Pulse Width: 0.8 ms
Lead Channel Sensing Intrinsic Amplitude: 12 mV
Lead Channel Sensing Intrinsic Amplitude: 2.4 mV
Lead Channel Setting Pacing Amplitude: 2.5 V
Lead Channel Setting Pacing Amplitude: 2.5 V
Lead Channel Setting Pacing Pulse Width: 0.5 ms
Lead Channel Setting Sensing Sensitivity: 2 mV
Pulse Gen Model: 2272
Pulse Gen Serial Number: 3920245

## 2022-04-14 NOTE — Patient Instructions (Signed)
Medication Instructions:  Your physician recommends that you continue on your current medications as directed. Please refer to the Current Medication list given to you today.  *If you need a refill on your cardiac medications before your next appointment, please call your pharmacy*   Lab Work: None If you have labs (blood work) drawn today and your tests are completely normal, you will receive your results only by: MyChart Message (if you have MyChart) OR A paper copy in the mail If you have any lab test that is abnormal or we need to change your treatment, we will call you to review the results.   Follow-Up: At [facility], you and your health needs are our priority.  As part of our continuing mission to provide you with exceptional heart care, we have created designated Provider Care Teams.  These Care Teams include your primary Cardiologist (physician) and Advanced Practice Providers (APPs -  Physician Assistants and Nurse Practitioners) who all work together to provide you with the care you need, when you need it.  Your next appointment:   6 month(s)  The format for your next appointment:   In Person  Provider:   Will Camnitz, MD{   

## 2022-04-14 NOTE — Progress Notes (Signed)
Electrophysiology Office Note Date: 04/14/2022  ID:  Colleen [name], DOB [birth year], MRN [id number]  PCP: Orpah Melter, MD Primary Cardiologist: None Electrophysiologist: Vickie Epley, MD   CC: Pacemaker follow-up  Colleen [name] is a 56 y.o. female seen today for Vickie Epley, MD for routine electrophysiology followup.  Since last being seen in our clinic the patient reports doing very well. She had CABG in April and is continuing to recover.  she denies chest pain, palpitations, dyspnea, PND, orthopnea, nausea, vomiting, dizziness, syncope, edema, weight gain, or early satiety.  Device History: St. Jude Dual Chamber PPM implanted 01/06/2021 for CHB  Past Medical History:  Diagnosis Date   Asthma    Complication of anesthesia    Psuedocholinerasterase deficiency per pt   Diabetes mellitus    TYPE II   Diabetic macular edema ([place])    Pt gets injections in eyes with [facility]   Seasonal allergies    Past Surgical History:  Procedure Laterality Date   BACK SURGERY  2009   CATARACT SURGERY     BILATERAL   CESAREAN SECTION     X2   CORONARY ARTERY BYPASS GRAFT  12/05/2021   KNEE SURGERY Right    LEFT HEART CATH AND CORONARY ANGIOGRAPHY N/A 05/08/2017   Procedure: Left Heart Cath and Coronary Angiography;  Surgeon: Nigel Mormon, MD;  Location: [place] [facility];  Service: Cardiovascular;  Laterality: N/A;   ORIF ANKLE FRACTURE Left 03/23/2016   Procedure: OPEN REDUCTION INTERNAL FIXATION (ORIF) ANKLE FRACTURE MALLEOLUS  MALUNION WITH TIBIAL AND FIBULAR OSTEOTOMY AND AUTOGRAFT;  Surgeon: Wylene Simmer, MD;  Location: [place];  Service: Orthopedics;  Laterality: Left;   PACEMAKER IMPLANT N/A 01/06/2021   Procedure: PACEMAKER IMPLANT;  Surgeon: Vickie Epley, MD;  Location: [place] [facility];  Service: Cardiovascular;  Laterality: N/A;   RIGHT/LEFT HEART CATH AND CORONARY ANGIOGRAPHY N/A 12/24/2020   Procedure: RIGHT/LEFT HEART CATH  AND CORONARY ANGIOGRAPHY;  Surgeon: Nigel Mormon, MD;  Location: [place] [facility];  Service: Cardiovascular;  Laterality: N/A;   SHOULDER ARTHROSCOPY W/ ROTATOR CUFF REPAIR Left    TEMPORARY PACEMAKER N/A 12/19/2020   Procedure: TEMPORARY PACEMAKER;  Surgeon: Nigel Mormon, MD;  Location: [place] [facility];  Service: Cardiovascular;  Laterality: N/A;   TEMPORARY PACEMAKER N/A 12/19/2020   Procedure: TEMPORARY PACEMAKER;  Surgeon: Nigel Mormon, MD;  Location: [place] [facility];  Service: Cardiovascular;  Laterality: N/A;   TEMPORARY PACEMAKER N/A 01/02/2021   Procedure: TEMPORARY PACEMAKER;  Surgeon: Martinique, Peter M, MD;  Location: [place] [facility];  Service: Cardiovascular;  Laterality: N/A;    Current Outpatient Medications  Medication Sig Dispense Refill   albuterol (PROVENTIL HFA;VENTOLIN HFA) 108 (90 Base) MCG/ACT inhaler Inhale 1-2 puffs into the lungs every 6 (six) hours as needed for wheezing or shortness of breath.     apixaban (ELIQUIS) 2.5 MG TABS tablet Take 2.5 mg by mouth 2 (two) times daily.     aspirin EC 81 MG tablet Take 81 mg by mouth daily. Swallow whole.     Cholecalciferol (VITAMIN D) 2000 units tablet Take 2,000 Units by mouth daily.     insulin lispro (HUMALOG) 100 UNIT/ML injection Inject into the skin See admin instructions. Insulin Pump - Mini Medtronic 670G     loratadine (CLARITIN) 10 MG tablet Take 1 tablet by mouth daily as needed for allergies.     losartan (COZAAR) 25 MG tablet Take 1  tablet (25 mg total) by mouth daily. 30 tablet 3   metoprolol succinate (TOPROL-XL) 50 MG 24 hr tablet Take 50 mg by mouth daily. Take with or immediately following a meal.     nitroGLYCERIN (NITROSTAT) 0.4 MG SL tablet Place 1 tablet (0.4 mg total) under the tongue every 5 (five) minutes x 3 doses as needed for chest pain. 25 tablet 1   rosuvastatin (CRESTOR) 10 MG tablet Take 1 tablet (10 mg total) by mouth at bedtime. 90 tablet 3   tobramycin (TOBREX)  0.3 % ophthalmic solution Place 2 drops into both eyes as needed (prior to injections).      torsemide (DEMADEX) 20 MG tablet TAKE 1 TABLET BY MOUTH  TWICE DAILY AS NEEDED FOR  LEG SWELLING 180 tablet 3   No current facility-administered medications for this visit.    Allergies:   Iodine, Iohexol, Other, and Succinylcholine   Social History: Social History   Socioeconomic History   Marital status: Married    Spouse name: Not on file   Number of children: 2   Years of education: Not on file   Highest education level: Not on file  Occupational History   Not on file  Tobacco Use   Smoking status: Former    Packs/day: 0.25    Years: 2.00    Total pack years: 0.50    Types: Cigarettes    Quit date: 01/17/1987    Years since quitting: 35.2   Smokeless tobacco: Never  Vaping Use   Vaping Use: Never used  Substance and Sexual Activity   Alcohol use: Yes    Comment: occ   Drug use: No   Sexual activity: Yes    Comment: PATIENT'S PARTNER WITH VASECTOMY  Other Topics Concern   Not on file  Social History Narrative   Not on file   Social Determinants of Health   Financial Resource Strain: Not on file  Food Insecurity: Not on file  Transportation Needs: Not on file  Physical Activity: Not on file  Stress: Not on file  Social Connections: Not on file  Intimate Partner Violence: Not on file    Family History: Family History  Problem Relation Age of Onset   Hypertension Father    Colon cancer Father 72   Hypertension Paternal Aunt    Hypertension Paternal Grandmother    Hypertension Paternal Grandfather      Review of Systems: All other systems reviewed and are otherwise negative except as noted above.  Physical Exam: Vitals:   04/14/22 0950  BP: 140/76  Pulse: 79  SpO2: 99%  Weight: 214 lb 12.5 oz (97.4 kg)  Height: '5\' 3"'$  (1.6 m)     GEN- The patient is well appearing, alert and oriented x 3 today.   HEENT: normocephalic, atraumatic; sclera clear,  conjunctiva pink; hearing intact; oropharynx clear; neck supple  Lungs- Clear to ausculation bilaterally, normal work of breathing.  No wheezes, rales, rhonchi Heart- Regular rate and rhythm, no murmurs, rubs or gallops  GI- soft, non-tender, non-distended, bowel sounds present  Extremities- no clubbing or cyanosis. No edema MS- no significant deformity or atrophy Skin- warm and dry, no rash or lesion; PPM pocket well healed Psych- euthymic mood, full affect Neuro- strength and sensation are intact  PPM Interrogation- reviewed in detail today,  See PACEART report  EKG:  EKG is not ordered today.  Recent Labs: 04/07/2022: BUN 29; Creatinine, Ser 1.29; Potassium 4.3; Sodium 140   Wt Readings from Last 3 Encounters:  04/14/22 214 lb 12.5 oz (97.4 kg)  04/12/22 212 lb (96.2 kg)  03/13/22 210 lb 9.6 oz (95.5 kg)     Other studies Reviewed: Additional studies/ records that were reviewed today include: Previous EP office notes, Previous remote checks, Most recent labwork.   Assessment and Plan:  1. CHB s/p St. Jude PPM  Normal PPM function See Pace Art report No changes today  2. HTN Elevated on arrival Her BP meds were adjusted 7/19. No change warranted today.   3. PAF Quiescent s/p CABG/MAZE.  She would like to consider coming off [place].  Continue eliquis for now, with dose that should be 5 mg BID.  RTC 6 months to discuss with Dr. Quentin Ore. If remains quiescent may be able to consider close monitoring of AF burden with her device, though CHA2DS2VASc  is at least 4.   Current medicines are reviewed at length with the patient today.    Disposition:   Follow up with Dr. Quentin Ore in 6 months    Signed, Annamaria Helling  04/14/2022 9:58 AM  [place] [address] [place] [address] [phone] (office) [phone] (fax)

## 2022-04-20 NOTE — Progress Notes (Signed)
Remote pacemaker transmission.   

## 2022-04-22 LAB — BRAIN NATRIURETIC PEPTIDE: BNP: 302 pg/mL — ABNORMAL HIGH (ref 0.0–100.0)

## 2022-04-22 LAB — BASIC METABOLIC PANEL
BUN/Creatinine Ratio: 28 — ABNORMAL HIGH (ref 9–23)
BUN: 42 mg/dL — ABNORMAL HIGH (ref 6–24)
CO2: 24 mmol/L (ref 20–29)
Calcium: 9.3 mg/dL (ref 8.7–10.2)
Chloride: 98 mmol/L (ref 96–106)
Creatinine, Ser: 1.49 mg/dL — ABNORMAL HIGH (ref 0.57–1.00)
Glucose: 143 mg/dL — ABNORMAL HIGH (ref 70–99)
Potassium: 4.6 mmol/L (ref 3.5–5.2)
Sodium: 137 mmol/L (ref 134–144)
eGFR: 40 mL/min/{1.73_m2} — ABNORMAL LOW (ref >59)

## 2022-04-23 ENCOUNTER — Encounter: Payer: Self-pay | Admitting: Cardiology

## 2022-04-24 ENCOUNTER — Other Ambulatory Visit: Payer: Self-pay | Admitting: Cardiology

## 2022-04-24 DIAGNOSIS — I5020 Unspecified systolic (congestive) heart failure: Secondary | ICD-10-CM

## 2022-04-24 NOTE — Telephone Encounter (Signed)
From pt

## 2022-04-26 ENCOUNTER — Other Ambulatory Visit: Payer: Self-pay

## 2022-04-26 DIAGNOSIS — I5020 Unspecified systolic (congestive) heart failure: Secondary | ICD-10-CM

## 2022-04-26 MED ORDER — LOSARTAN POTASSIUM 25 MG PO TABS: 12.5000 mg | ORAL_TABLET | Freq: Every day | ORAL | 3 refills | Status: DC

## 2022-05-05 LAB — BASIC METABOLIC PANEL
BUN/Creatinine Ratio: 23 (ref 9–23)
BUN: 34 mg/dL — ABNORMAL HIGH (ref 6–24)
CO2: 23 mmol/L (ref 20–29)
Calcium: 8.5 mg/dL — ABNORMAL LOW (ref 8.7–10.2)
Chloride: 101 mmol/L (ref 96–106)
Creatinine, Ser: 1.46 mg/dL — ABNORMAL HIGH (ref 0.57–1.00)
Glucose: 159 mg/dL — ABNORMAL HIGH (ref 70–99)
Potassium: 4.3 mmol/L (ref 3.5–5.2)
Sodium: 140 mmol/L (ref 134–144)
eGFR: 41 mL/min/{1.73_m2} — ABNORMAL LOW (ref >59)

## 2022-05-11 ENCOUNTER — Ambulatory Visit: Payer: 59 | Admitting: Cardiology

## 2022-05-11 ENCOUNTER — Encounter: Payer: Self-pay | Admitting: Cardiology

## 2022-05-11 VITALS — BP 138/71 | HR 68 | Temp 98.9°F | Resp 16 | Ht 63.0 in | Wt 214.0 lb

## 2022-05-11 DIAGNOSIS — I5020 Unspecified systolic (congestive) heart failure: Secondary | ICD-10-CM

## 2022-05-11 DIAGNOSIS — I442 Atrioventricular block, complete: Secondary | ICD-10-CM

## 2022-05-11 DIAGNOSIS — I480 Paroxysmal atrial fibrillation: Secondary | ICD-10-CM

## 2022-05-11 DIAGNOSIS — I2510 Atherosclerotic heart disease of native coronary artery without angina pectoris: Secondary | ICD-10-CM

## 2022-05-11 NOTE — Progress Notes (Signed)
Follow up visit  Subjective:   Colleen [name], female    DOB: 1965-10-07, 56 y.o.   MRN: [id number]   HPI    Chief Complaint  Patient presents with   Congestive Heart Failure   Follow-up    4 week    56 y.o. Caucasian female  with hypertension, type 1 diabetes mellitus, CAD with RCA and Lcx CTOs, late presentation inferior MI (11/4915), complicated by complete AV block and AKI/CKD, RV failure, resolution of AV block and AKI on discharge, followed by repeat admission with asystole cardiac arrest (12/2020), now s/p PPM, s/p CABGX3 (LIMA-LAD, SVG-RCA, SVG-OM), LAA clip, Maze at [facility] (05/1504), complicated by sternal incision infection, subsequent AKI while on Entresto.  Since her last visit, she was switched from losartan to [facility] by her [place] intervetional cardiologist Dr. Gust [name]. She has tolerated this fairly well. Cr has stayed stable as well. Leg edema has improved.    Current Outpatient Medications:    albuterol (PROVENTIL HFA;VENTOLIN HFA) 108 (90 Base) MCG/ACT inhaler, Inhale 1-2 puffs into the lungs every 6 (six) hours as needed for wheezing or shortness of breath., Disp: , Rfl:    apixaban (ELIQUIS) 2.5 MG TABS tablet, Take 2.5 mg by mouth 2 (two) times daily., Disp: , Rfl:    aspirin EC 81 MG tablet, Take 81 mg by mouth daily. Swallow whole., Disp: , Rfl:    Cholecalciferol (VITAMIN D) 2000 units tablet, Take 2,000 Units by mouth daily., Disp: , Rfl:    insulin lispro (HUMALOG) 100 UNIT/ML injection, Inject into the skin See admin instructions. Insulin Pump - Mini Medtronic 670G, Disp: , Rfl:    loratadine (CLARITIN) 10 MG tablet, Take 1 tablet by mouth daily as needed for allergies., Disp: , Rfl:    metoprolol succinate (TOPROL-XL) 50 MG 24 hr tablet, Take 25 mg by mouth daily. Take with or immediately following a meal., Disp: , Rfl:    nitroGLYCERIN (NITROSTAT) 0.4 MG SL tablet, Place 1 tablet (0.4 mg total) under the tongue every 5 (five) minutes x 3 doses as needed  for chest pain., Disp: 25 tablet, Rfl: 1   rosuvastatin (CRESTOR) 10 MG tablet, Take 1 tablet (10 mg total) by mouth at bedtime., Disp: 90 tablet, Rfl: 3   sacubitril-valsartan (ENTRESTO) 24-26 MG, Take 1 tablet by mouth 2 (two) times daily., Disp: , Rfl:    tobramycin (TOBREX) 0.3 % ophthalmic solution, Place 2 drops into both eyes as needed (prior to injections). , Disp: , Rfl:    torsemide (DEMADEX) 20 MG tablet, TAKE 1 TABLET BY MOUTH  TWICE DAILY AS NEEDED FOR  LEG SWELLING, Disp: 180 tablet, Rfl: 3  Cardiovascular & other pertient studies:  EKG 03/13/2022: Sinus rhythm 60 bpm First degree AV block Inferolateral T wave inversion, consider ischemia  Echocardiogram 04/05/2022:  Left ventricle cavity is normal in size. Mild concentric hypertrophy of  the left ventricle. Abnormal septal wall motion due to post-operative  coronary artery bypass graft. Mild global hypokinesis. LVEF 50-55%.  Doppler evidence of grade II (pseudonormal) diastolic dysfunction,  elevated LAP.  Right ventricle cavity not well visualized, but at least mildly dilated  with at least mildly reduced systolic function.  Device lead is seen.  Left atrial cavity is mildly dilated.  Right atrial cavity is mild to moderately dilated.  Mild (Grade I) mitral regurgitation.  Moderate tricuspid regurgitation. Estimated pulmonary artery systolic  pressure at least 26 mmHg.  IVC is dilated with a respiratory response of <50%. Estimated right  atrial  pressure at least 15 mmHg.  Compared to previous study on 02/25/2021, LVEF has improved from 35-40%.   CABG note 11/2021 ([facility], Dr. Jimmye Norman): CABG x 3 (LIMA-LAD, SVG-RCA, SVG-OM2), LAA Clip, MAZE =  [facility] echocardiogram 10/2021: MILD LV DYSFUNCTION (See above) WITH MILD LVH  NORMAL RIGHT VENTRICULAR SYSTOLIC FUNCTION  VALVULAR REGURGITATION: MILD MR, TRIVIAL PR, MILD TR  NO VALVULAR STENOSIS  NO PRIOR STUDY FOR COMPARISON   Recent labs: 05/04/2022: Glucose 159, BUN/Cr  34/1.46. EGFR 41. Na/K 140/4.3.  BNP 302 H/H 9/28. MCV 94. Platelets 199   Review of Systems  Constitutional: Positive for malaise/fatigue.  Cardiovascular:  Positive for dyspnea on exertion and leg swelling. Negative for chest pain, palpitations and syncope.         Vitals:   05/11/22 1526  BP: 138/71  Pulse: 68  Resp: 16  Temp: 98.9 F (37.2 C)  SpO2: 97%    Body mass index is 37.91 kg/m. Filed Weights   05/11/22 1526  Weight: 214 lb (97.1 kg)     Objective:   Physical Exam Vitals and nursing note reviewed.  Constitutional:      General: She is not in acute distress. Neck:     Vascular: No JVD.  Cardiovascular:     Rate and Rhythm: Normal rate and regular rhythm.     Heart sounds: Normal heart sounds. No murmur heard. Pulmonary:     Effort: Pulmonary effort is normal.     Breath sounds: Normal breath sounds. No wheezing or rales.  Musculoskeletal:     Right lower leg: Edema (Trace) present.     Left lower leg: Edema (Trace) present.           Assessment & Recommendations:   56 y.o. Caucasian female  with hypertension, type 1 diabetes mellitus, CAD with RCA and Lcx CTOs, late presentation inferior MI (02/7892), complicated by complete AV block and AKI/CKD, RV failure, resolution of AV block and AKI on discharge, followed by repeat admission with asystole cardiac arrest (12/2020), now s/p PPM, s/p CABGX3 (LIMA-LAD, SVG-RCA, SVG-OM), LAA clip, Maze at [facility] (04/1016), complicated by sternal incision infection, subsequent AI while on Entresto.  CAD, ischemic cardiomyopathy: s/p CABGX3 (LIMA-LAD, SVG-RCA, SVG-OM), LAA clip, Maze at [facility] (11/2021) She still has exertional dyspnea and fatigue. Even though LVRF has nearly normalized, RV is still dysfunctional post CABG. Recommend GDMT for HFrEF.  Continue Entresto 24-26 mg bid, metoprolol succinate to 25 mg daily. Torsemide is redced to 20 mg daily from bid to avoid dehydration and Aki while on Entresto. Continue  aspirin, statin.  PAF:  No recent recurrence.  She has  undergone LAA clip and Maze procedure at the time of her CABG (11/2021). Patient is type 1 diabetic and is extremely frustrated with bleeding from insulin lump and having to change her cartridges due to that.  We had a discussion regarding risks and benefits of stopping anticoagulation, including stroke. Patient chose to discontinue anticoagulation and continue Aspirin 81 mg only. Overall, I feel this is a reasonable decision.  Complete AV block: Normal functioning pacemaker. Has follow up with EP.  CKD stage 3: She has not tolerated Entresto due to AKI. Will monitor renal function closely with introduction of losartan.  Type 1 DM: Continue insulin pump.  Follow-up with Dr. Chalmers Cater  F/u in 3 months  Time spent: 53 min   Nigel Mormon, MD Pager: [phone] Office: [phone]

## 2022-05-12 ENCOUNTER — Encounter: Payer: Self-pay | Admitting: Cardiology

## 2022-05-12 NOTE — Telephone Encounter (Signed)
From pt

## 2022-05-14 ENCOUNTER — Encounter: Payer: Self-pay | Admitting: Cardiology

## 2022-06-20 ENCOUNTER — Other Ambulatory Visit: Payer: Self-pay | Admitting: Cardiology

## 2022-06-20 DIAGNOSIS — I480 Paroxysmal atrial fibrillation: Secondary | ICD-10-CM

## 2022-07-06 ENCOUNTER — Other Ambulatory Visit: Payer: Self-pay | Admitting: Cardiology

## 2022-07-07 ENCOUNTER — Ambulatory Visit (INDEPENDENT_AMBULATORY_CARE_PROVIDER_SITE_OTHER): Payer: 59

## 2022-07-07 DIAGNOSIS — I442 Atrioventricular block, complete: Secondary | ICD-10-CM

## 2022-07-07 LAB — CUP PACEART REMOTE DEVICE CHECK
Battery Remaining Longevity: 77 mo
Battery Remaining Percentage: 86 %
Battery Voltage: 3.02 V
Brady Statistic AP VP Percent: 73 %
Brady Statistic AP VS Percent: 1 %
Brady Statistic AS VP Percent: 18 %
Brady Statistic AS VS Percent: 8.5 %
Brady Statistic RA Percent Paced: 73 %
Brady Statistic RV Percent Paced: 91 %
Date Time Interrogation Session: 20231013020015
Implantable Lead Implant Date: 20220414
Implantable Lead Implant Date: 20220414
Implantable Lead Location: 753859
Implantable Lead Location: 753860
Implantable Lead Model: 1944
Implantable Pulse Generator Implant Date: 20220414
Lead Channel Impedance Value: 440 Ohm
Lead Channel Impedance Value: 580 Ohm
Lead Channel Pacing Threshold Amplitude: 1 V
Lead Channel Pacing Threshold Amplitude: 1.25 V
Lead Channel Pacing Threshold Pulse Width: 0.5 ms
Lead Channel Pacing Threshold Pulse Width: 0.8 ms
Lead Channel Sensing Intrinsic Amplitude: 12 mV
Lead Channel Sensing Intrinsic Amplitude: 2.6 mV
Lead Channel Setting Pacing Amplitude: 2.5 V
Lead Channel Setting Pacing Amplitude: 2.5 V
Lead Channel Setting Pacing Pulse Width: 0.5 ms
Lead Channel Setting Sensing Sensitivity: 2 mV
Pulse Gen Model: 2272
Pulse Gen Serial Number: 3920245

## 2022-07-12 NOTE — Progress Notes (Signed)
Remote pacemaker transmission.   

## 2022-07-25 ENCOUNTER — Encounter: Payer: Self-pay | Admitting: Cardiology

## 2022-07-25 NOTE — Telephone Encounter (Signed)
Reviewed transmission from 07/07/22.  According to transmission AT/AF burden is 0% with no recorded episodes since March.  Patient made aware.

## 2022-07-27 ENCOUNTER — Other Ambulatory Visit: Payer: Self-pay | Admitting: Cardiology

## 2022-07-27 DIAGNOSIS — I2510 Atherosclerotic heart disease of native coronary artery without angina pectoris: Secondary | ICD-10-CM

## 2022-08-08 NOTE — Progress Notes (Signed)
Follow up visit  Subjective:   Colleen [name], female    DOB: [birth year], 56 y.o.   MRN: [id number]   HPI    Chief Complaint  Patient presents with   HFrEF   Follow-up    3 month    56 y.o. Caucasian female  with hypertension, type 1 diabetes mellitus, CAD with RCA and Lcx CTOs, late presentation inferior MI (11/4915), complicated by complete AV block and AKI/CKD, RV failure, resolution of AV block and AKI on discharge, followed by repeat admission with asystole cardiac arrest (12/2020), now s/p PPM, s/p CABGX3 (LIMA-LAD, SVG-RCA, SVG-OM), LAA clip, Maze at [facility] (05/1504), complicated by sternal incision infection, subsequent AKI while on Entresto.  Patient is doing well. She denies chest pain, shortness of breath, palpitations, leg edema, orthopnea, PND, TIA/syncope.She is now on Ozempic as a part of clinical trial, and is doing well. Blood pressure elevated today, but generally well controlled.     Current Outpatient Medications:    albuterol (PROVENTIL HFA;VENTOLIN HFA) 108 (90 Base) MCG/ACT inhaler, Inhale 1-2 puffs into the lungs every 6 (six) hours as needed for wheezing or shortness of breath., Disp: , Rfl:    aspirin EC 81 MG tablet, Take 81 mg by mouth daily. Swallow whole., Disp: , Rfl:    Cholecalciferol (VITAMIN D) 2000 units tablet, Take 2,000 Units by mouth daily., Disp: , Rfl:    ENTRESTO 24-26 MG, TAKE 1 TABLET BY MOUTH  TWICE DAILY, Disp: 180 tablet, Rfl: 3   insulin lispro (HUMALOG) 100 UNIT/ML injection, Inject into the skin See admin instructions. Insulin Pump - Mini Medtronic 670G, Disp: , Rfl:    loratadine (CLARITIN) 10 MG tablet, Take 1 tablet by mouth daily as needed for allergies., Disp: , Rfl:    metoprolol succinate (TOPROL-XL) 50 MG 24 hr tablet, Take 25 mg by mouth daily. Take with or immediately following a meal., Disp: , Rfl:    nitroGLYCERIN (NITROSTAT) 0.4 MG SL tablet, Place 1 tablet (0.4 mg total) under the tongue every 5 (five) minutes x 3 doses  as needed for chest pain., Disp: 25 tablet, Rfl: 1   rosuvastatin (CRESTOR) 10 MG tablet, TAKE 1 TABLET BY MOUTH AT  BEDTIME, Disp: 90 tablet, Rfl: 3   tobramycin (TOBREX) 0.3 % ophthalmic solution, Place 2 drops into both eyes as needed (prior to injections). , Disp: , Rfl:    torsemide (DEMADEX) 20 MG tablet, TAKE 1 TABLET BY MOUTH  TWICE DAILY AS NEEDED FOR  LEG SWELLING, Disp: 180 tablet, Rfl: 3  Cardiovascular & other pertient studies:  EKG 08/11/2022: Sinus rhythm 74 bpm Ventricular paced rhythm  Echocardiogram 04/05/2022:  Left ventricle cavity is normal in size. Mild concentric hypertrophy of  the left ventricle. Abnormal septal wall motion due to post-operative  coronary artery bypass graft. Mild global hypokinesis. LVEF 50-55%.  Doppler evidence of grade II (pseudonormal) diastolic dysfunction,  elevated LAP.  Right ventricle cavity not well visualized, but at least mildly dilated  with at least mildly reduced systolic function.  Device lead is seen.  Left atrial cavity is mildly dilated.  Right atrial cavity is mild to moderately dilated.  Mild (Grade I) mitral regurgitation.  Moderate tricuspid regurgitation. Estimated pulmonary artery systolic  pressure at least 26 mmHg.  IVC is dilated with a respiratory response of <50%. Estimated right atrial  pressure at least 15 mmHg.  Compared to previous study on 02/25/2021, LVEF has improved from 35-40%.   CABG note 11/2021 ([facility], Dr. Jimmye Norman): CABG  x 3 (LIMA-LAD, SVG-RCA, SVG-OM2), LAA Clip, MAZE =  [facility] echocardiogram 10/2021: MILD LV DYSFUNCTION (See above) WITH MILD LVH  NORMAL RIGHT VENTRICULAR SYSTOLIC FUNCTION  VALVULAR REGURGITATION: MILD MR, TRIVIAL PR, MILD TR  NO VALVULAR STENOSIS  NO PRIOR STUDY FOR COMPARISON   Recent labs: 05/04/2022: Glucose 159, BUN/Cr 34/1.46. EGFR 41. Na/K 140/4.3.  BNP 302 H/H 9/28. MCV 94. Platelets 199   Review of Systems  Constitutional: Positive for malaise/fatigue.   Cardiovascular:  Positive for dyspnea on exertion and leg swelling. Negative for chest pain, palpitations and syncope.         Vitals:   08/11/22 0919  BP: (!) 152/84  Pulse: 74  Resp: 16  SpO2: 100%    Body mass index is 34.9 kg/m. Filed Weights   08/11/22 0919  Weight: 197 lb (89.4 kg)     Objective:   Physical Exam Vitals and nursing note reviewed.  Constitutional:      General: She is not in acute distress. Neck:     Vascular: No JVD.  Cardiovascular:     Rate and Rhythm: Normal rate and regular rhythm.     Heart sounds: Normal heart sounds. No murmur heard. Pulmonary:     Effort: Pulmonary effort is normal.     Breath sounds: Normal breath sounds. No wheezing or rales.  Musculoskeletal:     Right lower leg: Edema (Trace) present.     Left lower leg: Edema (Trace) present.          Assessment & Recommendations:   56 y.o. Caucasian female  with hypertension, type 1 diabetes mellitus, CAD with RCA and Lcx CTOs, late presentation inferior MI (12/9673), complicated by complete AV block and AKI/CKD, RV failure, resolution of AV block and AKI on discharge, followed by repeat admission with asystole cardiac arrest (12/2020), now s/p PPM, s/p CABGX3 (LIMA-LAD, SVG-RCA, SVG-OM), LAA clip, Maze at [facility] (05/1637), complicated by sternal incision infection, subsequent AI while on Entresto.  CAD, ischemic cardiomyopathy: s/p CABGX3 (LIMA-LAD, SVG-RCA, SVG-OM), LAA clip, Maze at [facility] (11/2021) Significant improvement in symptoms on GDMT. Continue Entresto 24-26 mg bid, metoprolol succinate to 25 mg daily. Continue aspirin, statin.  PAF:  No recent recurrence.  She has  undergone LAA clip and Maze procedure at the time of her CABG (11/2021). Patient is type 1 diabetic and is extremely frustrated with bleeding from insulin lump and having to change her cartridges due to that.  We had a discussion (04/2022) regarding risks and benefits of stopping anticoagulation, including  stroke. Patient chose to discontinue anticoagulation and continue Aspirin 81 mg only. Overall, I feel this is a reasonable decision.  Complete AV block: Normal functioning pacemaker. Has follow up with EP.  CKD stage 3: She has not tolerated Entresto due to AKI. Will monitor renal function closely with introduction of losartan.  Type 1 DM: Continue insulin pump.  Follow-up with Dr. Chalmers Cater  F/u in 6 months    Nigel Mormon, MD Pager: [phone] Office: [phone]

## 2022-08-11 ENCOUNTER — Ambulatory Visit: Payer: 59 | Admitting: Cardiology

## 2022-08-11 ENCOUNTER — Encounter: Payer: Self-pay | Admitting: Cardiology

## 2022-08-11 VITALS — BP 152/84 | HR 74 | Resp 16 | Ht 63.0 in | Wt 197.0 lb

## 2022-08-11 DIAGNOSIS — I5020 Unspecified systolic (congestive) heart failure: Secondary | ICD-10-CM | POA: Insufficient documentation

## 2022-08-11 DIAGNOSIS — I2510 Atherosclerotic heart disease of native coronary artery without angina pectoris: Secondary | ICD-10-CM

## 2022-08-11 DIAGNOSIS — I480 Paroxysmal atrial fibrillation: Secondary | ICD-10-CM

## 2022-08-11 MED ORDER — FAMOTIDINE 20 MG PO TABS: 20.0000 mg | ORAL_TABLET | Freq: Two times a day (BID) | ORAL | 3 refills | Status: DC

## 2022-08-24 ENCOUNTER — Other Ambulatory Visit (HOSPITAL_BASED_OUTPATIENT_CLINIC_OR_DEPARTMENT_OTHER): Payer: Self-pay | Admitting: Family Medicine

## 2022-08-24 DIAGNOSIS — Z1231 Encounter for screening mammogram for malignant neoplasm of breast: Secondary | ICD-10-CM

## 2022-10-06 ENCOUNTER — Ambulatory Visit (INDEPENDENT_AMBULATORY_CARE_PROVIDER_SITE_OTHER): Payer: 59

## 2022-10-06 DIAGNOSIS — I442 Atrioventricular block, complete: Secondary | ICD-10-CM

## 2022-10-06 LAB — CUP PACEART REMOTE DEVICE CHECK
Battery Remaining Longevity: 80 mo
Battery Remaining Percentage: 84 %
Battery Voltage: 3.01 V
Brady Statistic AP VP Percent: 45 %
Brady Statistic AP VS Percent: 1 %
Brady Statistic AS VP Percent: 48 %
Brady Statistic AS VS Percent: 6.6 %
Brady Statistic RA Percent Paced: 45 %
Brady Statistic RV Percent Paced: 93 %
Date Time Interrogation Session: 20240112084948
Implantable Lead Connection Status: 753985
Implantable Lead Connection Status: 753985
Implantable Lead Implant Date: 20220414
Implantable Lead Implant Date: 20220414
Implantable Lead Location: 753859
Implantable Lead Location: 753860
Implantable Lead Model: 1944
Implantable Pulse Generator Implant Date: 20220414
Lead Channel Impedance Value: 510 Ohm
Lead Channel Impedance Value: 590 Ohm
Lead Channel Pacing Threshold Amplitude: 1 V
Lead Channel Pacing Threshold Amplitude: 1.25 V
Lead Channel Pacing Threshold Pulse Width: 0.5 ms
Lead Channel Pacing Threshold Pulse Width: 0.8 ms
Lead Channel Sensing Intrinsic Amplitude: 12 mV
Lead Channel Sensing Intrinsic Amplitude: 3 mV
Lead Channel Setting Pacing Amplitude: 2.5 V
Lead Channel Setting Pacing Amplitude: 2.5 V
Lead Channel Setting Pacing Pulse Width: 0.5 ms
Lead Channel Setting Sensing Sensitivity: 2 mV
Pulse Gen Model: 2272
Pulse Gen Serial Number: 3920245

## 2022-10-09 ENCOUNTER — Telehealth: Payer: Self-pay

## 2022-10-09 NOTE — Telephone Encounter (Signed)
Left detailed message for Pt requesting call back to device clinic.  Please advise Pt of afib burden noted on recent device transmission.  Offer follow up with afib clinic.  Or if Pt prefers she can follow up with Dr. Virgina Jock.  Results from recent device transmission sent to Dr. Virgina Jock for review.

## 2022-10-09 NOTE — Telephone Encounter (Signed)
-----  Message from Juluis Mire, RN sent at 10/09/2022  4:30 PM EST -----  ----- Message ----- From: Vickie Epley, MD Sent: 10/07/2022   9:55 PM EST To: Juluis Mire, RN; Bernestine Amass, RN; #  Device interrogation reviewed. Lead parameters stable. AF now present with longest episode 13 hours. Not currently on [place].  Patient needs appointment with AF clinic to discuss restarting [place].  Lysbeth Galas T. Quentin Ore, MD, [facility], [facility] Cardiac Electrophysiology

## 2022-10-11 ENCOUNTER — Other Ambulatory Visit: Payer: Self-pay | Admitting: Cardiology

## 2022-10-11 DIAGNOSIS — I480 Paroxysmal atrial fibrillation: Secondary | ICD-10-CM

## 2022-10-11 MED ORDER — APIXABAN 5 MG PO TABS: 5.0000 mg | ORAL_TABLET | Freq: Two times a day (BID) | ORAL | 3 refills | Status: DC

## 2022-10-11 NOTE — Telephone Encounter (Signed)
Pt has discussed with Dr. Virgina Jock and he has resumed her Eliquis.  Follow up appt scheduled with Dr. Quentin Ore.

## 2022-10-11 NOTE — Progress Notes (Signed)
Discussed with the patient. Given recurrence of Afib and stroke risk, recommend resuming anticoagulation. Sent Eliquis 5 mg bid. She has had major issues with using insulin pump while on anticoagulation, but she is willing to try.  She has an appointment with Dr. Quentin Ore on 10/23/2022.  I am not sure about her candidacy for Watchman device in the setting of prior surgical appendage closure, but certainly something we could explore.  Otherwise only definitive way to show complete appendage closure would be to perform a TEE.  Patient understands.  Given initiation of anticoagulation and almost a year since CABG< I have asked her to stop Aspirin, to reduce bleeding risk.   Nigel Mormon, MD

## 2022-10-11 NOTE — Telephone Encounter (Signed)
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 Quentin Ore apt scheduled for 10/23/21 to discuss [facility] or possible Watchman

## 2022-10-17 ENCOUNTER — Ambulatory Visit (HOSPITAL_BASED_OUTPATIENT_CLINIC_OR_DEPARTMENT_OTHER)
Admission: RE | Admit: 2022-10-17 | Discharge: 2022-10-17 | Disposition: A | Discharge status: Home / Self Care | Payer: 59 | Source: Ambulatory Visit | Attending: Family Medicine | Admitting: Family Medicine

## 2022-10-17 ENCOUNTER — Encounter (HOSPITAL_BASED_OUTPATIENT_CLINIC_OR_DEPARTMENT_OTHER): Payer: Self-pay

## 2022-10-17 DIAGNOSIS — Z1231 Encounter for screening mammogram for malignant neoplasm of breast: Secondary | ICD-10-CM | POA: Insufficient documentation

## 2022-10-23 ENCOUNTER — Encounter: Payer: Self-pay | Admitting: Cardiology

## 2022-10-23 ENCOUNTER — Ambulatory Visit: Payer: 59 | Attending: Cardiology | Admitting: Cardiology

## 2022-10-23 VITALS — BP 136/82 | HR 70 | Ht 63.0 in | Wt 193.0 lb

## 2022-10-23 DIAGNOSIS — I4819 Other persistent atrial fibrillation: Secondary | ICD-10-CM | POA: Diagnosis not present

## 2022-10-23 DIAGNOSIS — I25118 Atherosclerotic heart disease of native coronary artery with other forms of angina pectoris: Secondary | ICD-10-CM | POA: Diagnosis not present

## 2022-10-23 DIAGNOSIS — I442 Atrioventricular block, complete: Secondary | ICD-10-CM

## 2022-10-23 DIAGNOSIS — Z950 Presence of cardiac pacemaker: Secondary | ICD-10-CM

## 2022-10-23 NOTE — Progress Notes (Signed)
Remote pacemaker transmission.   

## 2022-10-23 NOTE — Progress Notes (Deleted)
Electrophysiology Office Follow up Visit Note:    Date:  10/23/2022   ID:  Colleen [name], DOB May 19, 1966, MRN [id number]  PCP:  Orpah Melter, MD  [facility] Cardiologist:  None  [facility] Electrophysiologist:  Vickie Epley, MD    Interval History:    Colleen [name] is a 57 y.o. female who presents for a follow up visit.   She sees Dr. Virgina Jock.  Last appointment with him was August 11, 2022.  I last saw the patient April 22, 2021.  The patient has a history of paroxysmal atrial fibrillation.  She was previously on anticoagulation but this was stopped by [facility] cardiology after her surgery.  Her pacemaker has shown brief salvos of atrial fibrillation.  Phone note from October 07, 2022 discusses a 13-hour long episode of atrial fibrillation.  She is not on anticoagulation but this was started by Dr. Virgina Jock.  The patient reports some bleeding around her insulin pump while on anticoagulation.  She is referred to discuss left atrial appendage closure.  Of note, she does have a history of left atrial appendage ligation during previous open heart surgery.  On December 05, 2021,CABG x 3 (LIMA-LAD, SVG-RCA, SVG-OM2), LAA Clip, MAZE.    Past Medical History:  Diagnosis Date   Asthma    Complication of anesthesia    Psuedocholinerasterase deficiency per pt   Diabetes mellitus    TYPE II   Diabetic macular edema ([place])    Pt gets injections in eyes with [facility]   Seasonal allergies     Past Surgical History:  Procedure Laterality Date   BACK SURGERY  2009   CATARACT SURGERY     BILATERAL   CESAREAN SECTION     X2   CORONARY ARTERY BYPASS GRAFT  12/05/2021   KNEE SURGERY Right    LEFT HEART CATH AND CORONARY ANGIOGRAPHY N/A 05/08/2017   Procedure: Left Heart Cath and Coronary Angiography;  Surgeon: Nigel Mormon, MD;  Location: [place] [facility];  Service: Cardiovascular;  Laterality: N/A;   ORIF ANKLE FRACTURE Left 03/23/2016   Procedure: OPEN REDUCTION  INTERNAL FIXATION (ORIF) ANKLE FRACTURE MALLEOLUS  MALUNION WITH TIBIAL AND FIBULAR OSTEOTOMY AND AUTOGRAFT;  Surgeon: Wylene Simmer, MD;  Location: [place];  Service: Orthopedics;  Laterality: Left;   PACEMAKER IMPLANT N/A 01/06/2021   Procedure: PACEMAKER IMPLANT;  Surgeon: Vickie Epley, MD;  Location: [place] [facility];  Service: Cardiovascular;  Laterality: N/A;   RIGHT/LEFT HEART CATH AND CORONARY ANGIOGRAPHY N/A 12/24/2020   Procedure: RIGHT/LEFT HEART CATH AND CORONARY ANGIOGRAPHY;  Surgeon: Nigel Mormon, MD;  Location: [place] [facility];  Service: Cardiovascular;  Laterality: N/A;   SHOULDER ARTHROSCOPY W/ ROTATOR CUFF REPAIR Left    TEMPORARY PACEMAKER N/A 12/19/2020   Procedure: TEMPORARY PACEMAKER;  Surgeon: Nigel Mormon, MD;  Location: [place] [facility];  Service: Cardiovascular;  Laterality: N/A;   TEMPORARY PACEMAKER N/A 12/19/2020   Procedure: TEMPORARY PACEMAKER;  Surgeon: Nigel Mormon, MD;  Location: [place] [facility];  Service: Cardiovascular;  Laterality: N/A;   TEMPORARY PACEMAKER N/A 01/02/2021   Procedure: TEMPORARY PACEMAKER;  Surgeon: Martinique, Peter M, MD;  Location: [place] [facility];  Service: Cardiovascular;  Laterality: N/A;    Current Medications: No outpatient medications have been marked as taking for the 10/23/22 encounter (Appointment) with Vickie Epley, MD.     Allergies:   Iodine, Iohexol, Other, and Succinylcholine   Social History   Socioeconomic History  Marital status: Married    Spouse name: Not on file   Number of children: 2   Years of education: Not on file   Highest education level: Not on file  Occupational History   Not on file  Tobacco Use   Smoking status: Former    Packs/day: 0.25    Years: 2.00    Total pack years: 0.50    Types: Cigarettes    Quit date: 01/17/1987    Years since quitting: 35.7   Smokeless tobacco: Never  Vaping Use   Vaping Use: Never used  Substance and  Sexual Activity   Alcohol use: Yes    Comment: occ   Drug use: No   Sexual activity: Yes    Comment: PATIENT'S PARTNER WITH VASECTOMY  Other Topics Concern   Not on file  Social History Narrative   Not on file   Social Determinants of Health   Financial Resource Strain: Not on file  Food Insecurity: Not on file  Transportation Needs: Not on file  Physical Activity: Not on file  Stress: Not on file  Social Connections: Not on file     Family History: The patient's family history includes Colon cancer (age of onset: 83) in her father; Hypertension in her father, paternal aunt, paternal grandfather, and paternal grandmother.  ROS:   Please see the history of present illness.    All other systems reviewed and are negative.  EKGs/Labs/Other Studies Reviewed:    The following studies were reviewed today:  October 23, 2022 in clinic device interrogation personally reviewed ***   Recent Labs: 04/20/2022: BNP 302.0 05/04/2022: BUN 34; Creatinine, Ser 1.46; Potassium 4.3; Sodium 140  Recent Lipid Panel    Component Value Date/Time   CHOL 118 01/02/2021 0136   CHOL 164 10/08/2019 0901   TRIG 80 01/03/2021 0527   HDL 42 01/02/2021 0136   HDL 99 10/08/2019 0901   CHOLHDL 2.8 01/02/2021 0136   VLDL 42 (H) 01/02/2021 0136   LDLCALC 34 01/02/2021 0136   LDLCALC 54 10/08/2019 0901   LDLDIRECT 61 10/08/2019 0901    Physical Exam:    VS:  There were no vitals taken for this visit.    Wt Readings from Last 3 Encounters:  08/11/22 197 lb (89.4 kg)  05/11/22 214 lb (97.1 kg)  04/14/22 214 lb 12.5 oz (97.4 kg)     GEN: *** Well nourished, well developed in no acute distress CARDIAC: ***RRR, no murmurs, rubs, gallops RESPIRATORY:  Clear to auscultation without rales, wheezing or rhonchi        ASSESSMENT:    1. Persistent atrial fibrillation ([place])   2. CHB (complete heart block) (HCC)   3. Cardiac pacemaker in situ   4. Coronary artery disease involving native  coronary artery of native heart with other form of angina pectoris ([place])    PLAN:    In order of problems listed above:  #Persistent atrial fibrillation History of Maze procedure during bypass surgery, now with recurrence of arrhythmia.  Thankfully, burden is relatively low.  Discussed stroke risk mitigation strategies including continuing anticoagulation versus left atrial appendage ligation.  Her left atrial appendage was surgically ligated during her bypass surgery.  There is been no assessment of left atrial appendage closure.  She is not a candidate for watchman given the presence of the surgical closure.  I think the for step would be to assess the closure using a CT or TEE.  Her kidney function is marginal so we will plan  for transesophageal echo.  If the appendage is completely ligated, patient should be able to stop anticoagulation and continue with aspirin monotherapy.  We did discuss the lack of data around this approach and a theoretical increased risk of stroke but the patient is very interested in avoiding long-term anticoagulation.  #Complete heart block #Permanent pacemaker in situ Device functioning appropriately.  Continue remote monitoring.   Virtual appointment after TEE.      Medication Adjustments/Labs and Tests Ordered: Current medicines are reviewed at length with the patient today.  Concerns regarding medicines are outlined above.  No orders of the defined types were placed in this encounter.  No orders of the defined types were placed in this encounter.    Signed, Lars Mage, MD, [employer], [facility] 10/23/2022 5:16 AM    Electrophysiology [facility]

## 2022-10-23 NOTE — Progress Notes (Signed)
Electrophysiology Office Follow up Visit Note:    Date:  10/23/2022   ID:  Colleen [name], DOB 10/12/1965, MRN [id number]  PCP:  Orpah Melter, MD  [facility] Cardiologist:  None  [facility] Electrophysiologist:  Vickie Epley, MD    Interval History:    Colleen [name] is a 57 y.o. female who presents for a follow up visit.   She sees Dr. Virgina Jock.  Last appointment with him was August 11, 2022.  I last saw the patient April 22, 2021.  The patient has a history of paroxysmal atrial fibrillation.  She was previously on anticoagulation but this was stopped by [facility] cardiology after her surgery.  Her pacemaker has shown brief salvos of atrial fibrillation.  Phone note from October 07, 2022 discusses a 13-hour long episode of atrial fibrillation.  She is not on anticoagulation but this was started by Dr. Virgina Jock.  The patient reports some bleeding around her insulin pump while on anticoagulation.  She is referred to discuss left atrial appendage closure.  Of note, she does have a history of left atrial appendage ligation during previous open heart surgery.  On December 05, 2021,CABG x 3 (LIMA-LAD, SVG-RCA, SVG-OM2), LAA Clip, MAZE.  Today, she reports that she has been struggling with her A flutter. She is unsure of how symptomatic she is. She is concerned about her Eliquis 5 MG due to her insulin pump injections. She struggles to balance these two. She reports that she restarted blood thinners the Monday after her interrogation date.  She reports that her last creatine was 1.75 and that she recently lost 25 pounds. She will FU with her cardiologist in March 2024.  She denies any chest pain, shortness of breath, or peripheral edema. No lightheadedness, headaches, syncope, orthopnea, or PND.  She reports that she was very sick during around Thanksgiving time. She believes she was infected with RSV at the time.   Past Medical History:  Diagnosis Date   Asthma     Complication of anesthesia    Psuedocholinerasterase deficiency per pt   Diabetes mellitus    TYPE II   Diabetic macular edema ([place])    Pt gets injections in eyes with Eyelea   Seasonal allergies     Past Surgical History:  Procedure Laterality Date   BACK SURGERY  2009   CATARACT SURGERY     BILATERAL   CESAREAN SECTION     X2   CORONARY ARTERY BYPASS GRAFT  12/05/2021   KNEE SURGERY Right    LEFT HEART CATH AND CORONARY ANGIOGRAPHY N/A 05/08/2017   Procedure: Left Heart Cath and Coronary Angiography;  Surgeon: Nigel Mormon, MD;  Location: [place] [facility];  Service: Cardiovascular;  Laterality: N/A;   ORIF ANKLE FRACTURE Left 03/23/2016   Procedure: OPEN REDUCTION INTERNAL FIXATION (ORIF) ANKLE FRACTURE MALLEOLUS  MALUNION WITH TIBIAL AND FIBULAR OSTEOTOMY AND AUTOGRAFT;  Surgeon: Wylene Simmer, MD;  Location: [place];  Service: Orthopedics;  Laterality: Left;   PACEMAKER IMPLANT N/A 01/06/2021   Procedure: PACEMAKER IMPLANT;  Surgeon: Vickie Epley, MD;  Location: [place] [facility];  Service: Cardiovascular;  Laterality: N/A;   RIGHT/LEFT HEART CATH AND CORONARY ANGIOGRAPHY N/A 12/24/2020   Procedure: RIGHT/LEFT HEART CATH AND CORONARY ANGIOGRAPHY;  Surgeon: Nigel Mormon, MD;  Location: [place] [facility];  Service: Cardiovascular;  Laterality: N/A;   SHOULDER ARTHROSCOPY W/ ROTATOR CUFF REPAIR Left    TEMPORARY PACEMAKER N/A 12/19/2020   Procedure: TEMPORARY PACEMAKER;  Surgeon: Nigel Mormon, MD;  Location: [place] [facility];  Service: Cardiovascular;  Laterality: N/A;   TEMPORARY PACEMAKER N/A 12/19/2020   Procedure: TEMPORARY PACEMAKER;  Surgeon: Nigel Mormon, MD;  Location: [place] [facility];  Service: Cardiovascular;  Laterality: N/A;   TEMPORARY PACEMAKER N/A 01/02/2021   Procedure: TEMPORARY PACEMAKER;  Surgeon: Martinique, Peter M, MD;  Location: [place] [facility];  Service: Cardiovascular;  Laterality: N/A;     Current Medications: No outpatient medications have been marked as taking for the 10/23/22 encounter (Appointment) with Vickie Epley, MD.     Allergies:   Iodine, Iohexol, Other, and Succinylcholine   Social History   Socioeconomic History   Marital status: Married    Spouse name: Not on file   Number of children: 2   Years of education: Not on file   Highest education level: Not on file  Occupational History   Not on file  Tobacco Use   Smoking status: Former    Packs/day: 0.25    Years: 2.00    Total pack years: 0.50    Types: Cigarettes    Quit date: 01/17/1987    Years since quitting: 35.7   Smokeless tobacco: Never  Vaping Use   Vaping Use: Never used  Substance and Sexual Activity   Alcohol use: Yes    Comment: occ   Drug use: No   Sexual activity: Yes    Comment: PATIENT'S PARTNER WITH VASECTOMY  Other Topics Concern   Not on file  Social History Narrative   Not on file   Social Determinants of Health   Financial Resource Strain: Not on file  Food Insecurity: Not on file  Transportation Needs: Not on file  Physical Activity: Not on file  Stress: Not on file  Social Connections: Not on file     Family History: The patient's family history includes Colon cancer (age of onset: 64) in her father; Hypertension in her father, paternal aunt, paternal grandfather, and paternal grandmother.  ROS:   Please see the history of present illness.     All other systems reviewed and are negative.  EKGs/Labs/Other Studies Reviewed:    The following studies were reviewed today:  October 23, 2022 in clinic device interrogation personally reviewed Battery longevity 6.4 years Lead parameter stable Ventricular pacing 93% AF burden 7.4% total although persistent for the last several weeks on the trend Atrial high rate episodes appear to be in atrial flutter   Recent Labs: 04/20/2022: BNP 302.0 05/04/2022: BUN 34; Creatinine, Ser 1.46; Potassium 4.3; Sodium  140  Recent Lipid Panel    Component Value Date/Time   CHOL 118 01/02/2021 0136   CHOL 164 10/08/2019 0901   TRIG 80 01/03/2021 0527   HDL 42 01/02/2021 0136   HDL 99 10/08/2019 0901   CHOLHDL 2.8 01/02/2021 0136   VLDL 42 (H) 01/02/2021 0136   LDLCALC 34 01/02/2021 0136   LDLCALC 54 10/08/2019 0901   LDLDIRECT 61 10/08/2019 0901    Physical Exam:    VS:  There were no vitals taken for this visit.    Wt Readings from Last 3 Encounters:  08/11/22 197 lb (89.4 kg)  05/11/22 214 lb (97.1 kg)  04/14/22 214 lb 12.5 oz (97.4 kg)     GEN:  Well nourished, well developed in no acute distress CARDIAC: RRR, no murmurs, rubs, gallops, Device pocket well healed. RESPIRATORY:  Clear to auscultation without rales, wheezing or rhonchi  ASSESSMENT:    1. Persistent atrial fibrillation ([place])   2. CHB (complete heart block) (HCC)   3. Cardiac pacemaker in situ   4. Coronary artery disease involving native coronary artery of native heart with other form of angina pectoris ([place])    PLAN:    In order of problems listed above:  #Persistent atrial fibrillation History of Maze procedure during bypass surgery, now with recurrence of arrhythmia.  She has been in persistent atrial fibrillation since the middle of January.    We first discussed treatment options for the recurrent arrhythmia.  We discussed antiarrhythmic drugs including amiodarone and dofetilide.  Given her young age I would like to avoid amiodarone.  Tikosyn will be tricky given her marginal kidney function.  If she were to be able to document stable kidney function on several checks, would feel comfortable proceeding with Tikosyn initiation.  We also discussed catheter ablation post maze.  We discussed the pathophysiology of post maze atrial flutter.  I discussed the procedural details including the risks, recovery, and likelihood of success.  We discussed the possibility that she would still require antiarrhythmic drugs  following a post maze atrial flutter ablation.  The second issue we discussed today is her anticoagulation.  She really would like to stop her anticoagulant given problems with bleeding around her insulin pump site.  I told her before I would feel comfortable stopping her anticoagulation I will need to assess the closure of the left atrial appendage from her open heart surgery.  This will be done with a TEE.  We can either do this TEE at the time of a cardioversion or before an ablation procedure.  We discussed the possible outcomes from a TEE and left atrial appendage clip.  If the appendage were to demonstrate successful closure, we discussed the data surrounding safety of anticoagulation discontinuation.  She would like to think about her options which I think is very reasonable and she will let us know how she would like to proceed.  If she were to decide to proceed with catheter ablation, would not plan for a CT PE protocol given her marginal kidney function.  Would need a TEE on the table to both assess the left atrial anatomy and left atrial appendage patency.  #Complete heart block #Permanent pacemaker in situ Device functioning appropriately.  Continue remote monitoring.    Total time of encounter: 41 minutes total time of encounter, including face-to-face patient care, coordination of care and counseling regarding high complexity medical decision making re: AF/AFL.   Follow-up: 3 months  Medication Adjustments/Labs and Tests Ordered: Current medicines are reviewed at length with the patient today.  Concerns regarding medicines are outlined above.  No orders of the defined types were placed in this encounter.  No orders of the defined types were placed in this encounter.   I,Mitra Faeizi,acting as a Education administrator for Vickie Epley, MD.,have documented all relevant documentation on the behalf of Vickie Epley, MD,as directed by  Vickie Epley, MD while in the presence of  Vickie Epley, MD.  I, Vickie Epley, MD, have reviewed all documentation for this visit. The documentation on 10/23/22 for the exam, diagnosis, procedures, and orders are all accurate and complete.   Signed, Lars Mage, MD, [facility], [facility] 10/23/2022 8:00 AM    Electrophysiology [facility]

## 2022-10-23 NOTE — Patient Instructions (Signed)
Medication Instructions:  Your physician recommends that you continue on your current medications as directed. Please refer to the Current Medication list given to you today.  *If you need a refill on your cardiac medications before your next appointment, please call your pharmacy*  Follow-Up: At [facility], you and your health needs are our priority.  As part of our continuing mission to provide you with exceptional heart care, we have created designated Provider Care Teams.  These Care Teams include your primary Cardiologist (physician) and Advanced Practice Providers (APPs -  Physician Assistants and Nurse Practitioners) who all work together to provide you with the care you need, when you need it.  Your next appointment:   3 month(s)  Provider:   Cameron Lambert, MD     

## 2022-10-25 ENCOUNTER — Encounter: Payer: Self-pay | Admitting: Cardiology

## 2022-10-25 ENCOUNTER — Other Ambulatory Visit: Payer: Self-pay

## 2022-10-25 ENCOUNTER — Telehealth: Payer: Self-pay | Admitting: Cardiology

## 2022-10-25 DIAGNOSIS — I4819 Other persistent atrial fibrillation: Secondary | ICD-10-CM

## 2022-10-25 NOTE — Telephone Encounter (Signed)
Patient is calling to schedule her ablation. She has certain days she can't have the ablation done, she would like to speak to nurse before it's schedule.

## 2022-10-25 NOTE — Telephone Encounter (Signed)
Pt is scheduled for 12/19/22.Marland KitchenMarland Kitchen

## 2022-10-25 NOTE — Telephone Encounter (Signed)
From patient.

## 2022-10-26 NOTE — Telephone Encounter (Signed)
Pt had called to ask if it was ok for her to go to her dentist the day before her procedure. I told her that should not interfere with her procedure.  She also would like to be put on a wait list, I have added her to the list.

## 2022-10-26 NOTE — Telephone Encounter (Signed)
Patient is calling back with some more questions bout the ablation. Please advise

## 2022-10-27 ENCOUNTER — Encounter: Payer: Self-pay | Admitting: Cardiology

## 2022-11-08 ENCOUNTER — Encounter: Payer: Self-pay | Admitting: Cardiology

## 2022-12-05 LAB — CBC
Hematocrit: 34.1 % (ref 34.0–46.6)
Hemoglobin: 11.2 g/dL (ref 11.1–15.9)
MCH: 30 pg (ref 26.6–33.0)
MCHC: 32.8 g/dL (ref 31.5–35.7)
MCV: 91 fL (ref 79–97)
Platelets: 137 10*3/uL — ABNORMAL LOW (ref 150–450)
RBC: 3.73 x10E6/uL — ABNORMAL LOW (ref 3.77–5.28)
RDW: 12.4 % (ref 11.7–15.4)
WBC: 3.9 10*3/uL (ref 3.4–10.8)

## 2022-12-06 LAB — BASIC METABOLIC PANEL
BUN/Creatinine Ratio: 15 (ref 9–23)
BUN: 20 mg/dL (ref 6–24)
CO2: 19 mmol/L — ABNORMAL LOW (ref 20–29)
Calcium: 8.8 mg/dL (ref 8.7–10.2)
Chloride: 101 mmol/L (ref 96–106)
Creatinine, Ser: 1.34 mg/dL — ABNORMAL HIGH (ref 0.57–1.00)
Glucose: 113 mg/dL — ABNORMAL HIGH (ref 70–99)
Potassium: 5 mmol/L (ref 3.5–5.2)
Sodium: 135 mmol/L (ref 134–144)
eGFR: 46 mL/min/{1.73_m2} — ABNORMAL LOW (ref >59)

## 2022-12-08 ENCOUNTER — Encounter: Payer: Self-pay | Admitting: Cardiology

## 2022-12-18 ENCOUNTER — Telehealth: Payer: Self-pay | Admitting: Cardiology

## 2022-12-18 NOTE — Telephone Encounter (Signed)
New Message:     Patient says she would like to talk to Dr Mardene Speak nurse. She is scheduled to have an Ablation tomorrow.

## 2022-12-18 NOTE — Telephone Encounter (Signed)
Spoke with pt who is scheduled for an ablation with Dr Quentin Ore tomorrow.  Pt has follow up with Afib clinic scheduled for 01/16/2023.  Pt will be leaving town on 01/15/2023 as her daughter is having a baby and she will be going to help.  Pt is requesting her Afib follow-up be moved to 01/11/2022.   Pt advised will forward message for assistance with rescheduling this appointment.  Pt verbalized understanding and thanked Therapist, sports for the assistance.

## 2022-12-18 NOTE — Pre-Procedure Instructions (Signed)
Instructed patient on the following items: Arrival time 1130 Nothing to eat or drink after midnight No meds AM of procedure Responsible person to drive you home and stay with you for 24 hrs  Have you missed any doses of anti-coagulant Elqiuis- hasn't missed any doses, don't take dose in the morning

## 2022-12-19 ENCOUNTER — Ambulatory Visit (HOSPITAL_COMMUNITY): Payer: 59 | Admitting: Anesthesiology

## 2022-12-19 ENCOUNTER — Encounter (HOSPITAL_COMMUNITY)
Admission: AD | Disposition: A | Discharge status: Home / Self Care | Payer: Self-pay | Source: Ambulatory Visit | Attending: Cardiology

## 2022-12-19 ENCOUNTER — Inpatient Hospital Stay (HOSPITAL_COMMUNITY)
Admission: AD | Admit: 2022-12-19 | Discharge: 2022-12-22 | DRG: 274 | Disposition: A | Discharge status: Home / Self Care | Payer: 59 | Source: Ambulatory Visit | Attending: Cardiology | Admitting: Cardiology

## 2022-12-19 ENCOUNTER — Ambulatory Visit (HOSPITAL_COMMUNITY): Payer: 59

## 2022-12-19 DIAGNOSIS — I110 Hypertensive heart disease with heart failure: Secondary | ICD-10-CM | POA: Diagnosis not present

## 2022-12-19 DIAGNOSIS — I4719 Other supraventricular tachycardia: Secondary | ICD-10-CM

## 2022-12-19 DIAGNOSIS — Z8249 Family history of ischemic heart disease and other diseases of the circulatory system: Secondary | ICD-10-CM

## 2022-12-19 DIAGNOSIS — I442 Atrioventricular block, complete: Secondary | ICD-10-CM | POA: Diagnosis present

## 2022-12-19 DIAGNOSIS — Z886 Allergy status to analgesic agent status: Secondary | ICD-10-CM

## 2022-12-19 DIAGNOSIS — I130 Hypertensive heart and chronic kidney disease with heart failure and stage 1 through stage 4 chronic kidney disease, or unspecified chronic kidney disease: Secondary | ICD-10-CM | POA: Diagnosis present

## 2022-12-19 DIAGNOSIS — Z800 Family history of malignant neoplasm of digestive organs: Secondary | ICD-10-CM

## 2022-12-19 DIAGNOSIS — E139 Other specified diabetes mellitus without complications: Secondary | ICD-10-CM | POA: Insufficient documentation

## 2022-12-19 DIAGNOSIS — E1051 Type 1 diabetes mellitus with diabetic peripheral angiopathy without gangrene: Secondary | ICD-10-CM | POA: Diagnosis present

## 2022-12-19 DIAGNOSIS — I25118 Atherosclerotic heart disease of native coronary artery with other forms of angina pectoris: Secondary | ICD-10-CM | POA: Diagnosis present

## 2022-12-19 DIAGNOSIS — I4891 Unspecified atrial fibrillation: Secondary | ICD-10-CM

## 2022-12-19 DIAGNOSIS — I509 Heart failure, unspecified: Secondary | ICD-10-CM | POA: Diagnosis not present

## 2022-12-19 DIAGNOSIS — I482 Chronic atrial fibrillation, unspecified: Secondary | ICD-10-CM

## 2022-12-19 DIAGNOSIS — Z951 Presence of aortocoronary bypass graft: Secondary | ICD-10-CM

## 2022-12-19 DIAGNOSIS — E669 Obesity, unspecified: Secondary | ICD-10-CM | POA: Diagnosis present

## 2022-12-19 DIAGNOSIS — E1022 Type 1 diabetes mellitus with diabetic chronic kidney disease: Secondary | ICD-10-CM | POA: Diagnosis present

## 2022-12-19 DIAGNOSIS — Z91041 Radiographic dye allergy status: Secondary | ICD-10-CM

## 2022-12-19 DIAGNOSIS — I5032 Chronic diastolic (congestive) heart failure: Secondary | ICD-10-CM | POA: Diagnosis present

## 2022-12-19 DIAGNOSIS — Z7985 Long-term (current) use of injectable non-insulin antidiabetic drugs: Secondary | ICD-10-CM

## 2022-12-19 DIAGNOSIS — I480 Paroxysmal atrial fibrillation: Principal | ICD-10-CM

## 2022-12-19 DIAGNOSIS — I2510 Atherosclerotic heart disease of native coronary artery without angina pectoris: Secondary | ICD-10-CM | POA: Diagnosis not present

## 2022-12-19 DIAGNOSIS — N1831 Chronic kidney disease, stage 3a: Secondary | ICD-10-CM

## 2022-12-19 DIAGNOSIS — I483 Typical atrial flutter: Secondary | ICD-10-CM | POA: Diagnosis present

## 2022-12-19 DIAGNOSIS — E10311 Type 1 diabetes mellitus with unspecified diabetic retinopathy with macular edema: Secondary | ICD-10-CM | POA: Diagnosis present

## 2022-12-19 DIAGNOSIS — Z87891 Personal history of nicotine dependence: Secondary | ICD-10-CM

## 2022-12-19 DIAGNOSIS — I959 Hypotension, unspecified: Secondary | ICD-10-CM | POA: Diagnosis not present

## 2022-12-19 DIAGNOSIS — I4819 Other persistent atrial fibrillation: Principal | ICD-10-CM | POA: Diagnosis present

## 2022-12-19 DIAGNOSIS — Z794 Long term (current) use of insulin: Secondary | ICD-10-CM

## 2022-12-19 DIAGNOSIS — Z6832 Body mass index (BMI) 32.0-32.9, adult: Secondary | ICD-10-CM

## 2022-12-19 DIAGNOSIS — Z7901 Long term (current) use of anticoagulants: Secondary | ICD-10-CM

## 2022-12-19 DIAGNOSIS — N1832 Chronic kidney disease, stage 3b: Secondary | ICD-10-CM | POA: Insufficient documentation

## 2022-12-19 DIAGNOSIS — Z79899 Other long term (current) drug therapy: Secondary | ICD-10-CM

## 2022-12-19 DIAGNOSIS — Z888 Allergy status to other drugs, medicaments and biological substances status: Secondary | ICD-10-CM

## 2022-12-19 DIAGNOSIS — Z9641 Presence of insulin pump (external) (internal): Secondary | ICD-10-CM | POA: Diagnosis present

## 2022-12-19 DIAGNOSIS — K219 Gastro-esophageal reflux disease without esophagitis: Secondary | ICD-10-CM | POA: Diagnosis present

## 2022-12-19 DIAGNOSIS — J45909 Unspecified asthma, uncomplicated: Secondary | ICD-10-CM | POA: Diagnosis present

## 2022-12-19 DIAGNOSIS — E1065 Type 1 diabetes mellitus with hyperglycemia: Secondary | ICD-10-CM | POA: Diagnosis not present

## 2022-12-19 DIAGNOSIS — E119 Type 2 diabetes mellitus without complications: Secondary | ICD-10-CM

## 2022-12-19 DIAGNOSIS — N179 Acute kidney failure, unspecified: Secondary | ICD-10-CM | POA: Diagnosis present

## 2022-12-19 DIAGNOSIS — Z950 Presence of cardiac pacemaker: Secondary | ICD-10-CM

## 2022-12-19 HISTORY — DX: Acute respiratory failure with hypoxia: J96.01

## 2022-12-19 HISTORY — DX: Acute kidney failure, unspecified: N17.9

## 2022-12-19 HISTORY — PX: ATRIAL FIBRILLATION ABLATION: EP1191

## 2022-12-19 HISTORY — DX: Chest pain, unspecified: R07.9

## 2022-12-19 HISTORY — PX: TEE WITHOUT CARDIOVERSION: SHX5443

## 2022-12-19 LAB — GLUCOSE, CAPILLARY
Glucose-Capillary: 110 mg/dL — ABNORMAL HIGH (ref 70–99)
Glucose-Capillary: 239 mg/dL — ABNORMAL HIGH (ref 70–99)
Glucose-Capillary: 168 mg/dL — ABNORMAL HIGH (ref 70–99)
Glucose-Capillary: 222 mg/dL — ABNORMAL HIGH (ref 70–99)

## 2022-12-19 LAB — POCT ACTIVATED CLOTTING TIME
Activated Clotting Time: 347 seconds
Activated Clotting Time: 325 seconds

## 2022-12-19 SURGERY — ATRIAL FIBRILLATION ABLATION
Anesthesia: General

## 2022-12-19 MED ORDER — LIDOCAINE 2% (20 MG/ML) 5 ML SYRINGE
INTRAMUSCULAR | Status: DC | PRN
  Administered 2022-12-19: 30 mg via INTRAVENOUS

## 2022-12-19 MED ORDER — MIDAZOLAM HCL 5 MG/5ML IJ SOLN
INTRAMUSCULAR | Status: DC | PRN
  Administered 2022-12-19 (×2): 1 mg via INTRAVENOUS

## 2022-12-19 MED ORDER — ONDANSETRON HCL 4 MG/2ML IJ SOLN
INTRAMUSCULAR | Status: DC | PRN
  Administered 2022-12-19: 4 mg via INTRAVENOUS

## 2022-12-19 MED ORDER — HEPARIN SODIUM (PORCINE) 1000 UNIT/ML IJ SOLN
INTRAMUSCULAR | Status: DC | PRN
  Administered 2022-12-19: 2000 [IU] via INTRAVENOUS
  Administered 2022-12-19: 13000 [IU] via INTRAVENOUS

## 2022-12-19 MED ORDER — ONDANSETRON HCL 4 MG/2ML IJ SOLN
4.0000 mg | Freq: Four times a day (QID) | INTRAMUSCULAR | Status: DC | PRN
  Administered 2022-12-20: 4 mg via INTRAVENOUS
  Filled 2022-12-19: qty 2

## 2022-12-19 MED ORDER — FENTANYL CITRATE (PF) 250 MCG/5ML IJ SOLN
INTRAMUSCULAR | Status: DC | PRN
  Administered 2022-12-19: 100 ug via INTRAVENOUS

## 2022-12-19 MED ORDER — SODIUM CHLORIDE 0.9% FLUSH: 3.0000 mL | INTRAVENOUS | Status: DC | PRN

## 2022-12-19 MED ORDER — SODIUM CHLORIDE 0.9% FLUSH
3.0000 mL | Freq: Two times a day (BID) | INTRAVENOUS | Status: DC
  Administered 2022-12-19 – 2022-12-22 (×4): 3 mL via INTRAVENOUS

## 2022-12-19 MED ORDER — ROSUVASTATIN CALCIUM 5 MG PO TABS
10.0000 mg | ORAL_TABLET | Freq: Every day | ORAL | Status: DC
  Administered 2022-12-19 – 2022-12-21 (×3): 10 mg via ORAL
  Filled 2022-12-19 (×4): qty 2

## 2022-12-19 MED ORDER — HEPARIN (PORCINE) IN NACL 1000-0.9 UT/500ML-% IV SOLN
INTRAVENOUS | Status: DC | PRN
  Administered 2022-12-19 (×3): 500 mL

## 2022-12-19 MED ORDER — CEFAZOLIN SODIUM-DEXTROSE 2-3 GM-%(50ML) IV SOLR
INTRAVENOUS | Status: DC | PRN
  Administered 2022-12-19: 2 g via INTRAVENOUS

## 2022-12-19 MED ORDER — INSULIN ASPART 100 UNIT/ML IJ SOLN
0.0000 [IU] | Freq: Three times a day (TID) | INTRAMUSCULAR | Status: DC
  Administered 2022-12-20 (×2): 15 [IU] via SUBCUTANEOUS

## 2022-12-19 MED ORDER — PHENYLEPHRINE HCL-NACL 20-0.9 MG/250ML-% IV SOLN
INTRAVENOUS | Status: DC | PRN
  Administered 2022-12-19: 25 ug/min via INTRAVENOUS

## 2022-12-19 MED ORDER — ROCURONIUM BROMIDE 10 MG/ML (PF) SYRINGE
PREFILLED_SYRINGE | INTRAVENOUS | Status: DC | PRN
  Administered 2022-12-19: 60 mg via INTRAVENOUS

## 2022-12-19 MED ORDER — METOPROLOL SUCCINATE ER 25 MG PO TB24: 25.0000 mg | ORAL_TABLET | Freq: Every day | ORAL | Status: DC

## 2022-12-19 MED ORDER — SACUBITRIL-VALSARTAN 24-26 MG PO TABS
1.0000 | ORAL_TABLET | Freq: Two times a day (BID) | ORAL | Status: DC
  Administered 2022-12-19: 1 via ORAL
  Filled 2022-12-19: qty 1

## 2022-12-19 MED ORDER — HEPARIN SODIUM (PORCINE) 1000 UNIT/ML IJ SOLN
INTRAMUSCULAR | Status: DC | PRN
  Administered 2022-12-19: 1000 [IU] via INTRAVENOUS

## 2022-12-19 MED ORDER — SUGAMMADEX SODIUM 200 MG/2ML IV SOLN
INTRAVENOUS | Status: DC | PRN
  Administered 2022-12-19: 200 mg via INTRAVENOUS

## 2022-12-19 MED ORDER — SCOPOLAMINE 1 MG/3DAYS TD PT72
1.0000 | MEDICATED_PATCH | TRANSDERMAL | Status: DC
  Administered 2022-12-19: 1.5 mg via TRANSDERMAL
  Filled 2022-12-19: qty 1

## 2022-12-19 MED ORDER — SODIUM CHLORIDE 0.9 % IV SOLN: INTRAVENOUS | Status: DC

## 2022-12-19 MED ORDER — DEXAMETHASONE SODIUM PHOSPHATE 10 MG/ML IJ SOLN
INTRAMUSCULAR | Status: DC | PRN
  Administered 2022-12-19: 5 mg via INTRAVENOUS

## 2022-12-19 MED ORDER — TORSEMIDE 20 MG PO TABS
20.0000 mg | ORAL_TABLET | Freq: Once | ORAL | Status: DC
  Filled 2022-12-19: qty 1

## 2022-12-19 MED ORDER — ACETAMINOPHEN 500 MG PO TABS
1000.0000 mg | ORAL_TABLET | Freq: Once | ORAL | Status: AC
  Administered 2022-12-19: 1000 mg via ORAL
  Filled 2022-12-19: qty 2

## 2022-12-19 MED ORDER — INSULIN ASPART 100 UNIT/ML IJ SOLN
INTRAMUSCULAR | Status: AC
  Administered 2022-12-19: 5 [IU] via SUBCUTANEOUS
  Filled 2022-12-19: qty 1

## 2022-12-19 MED ORDER — CEFAZOLIN SODIUM-DEXTROSE 2-4 GM/100ML-% IV SOLN
INTRAVENOUS | Status: AC
  Filled 2022-12-19: qty 100

## 2022-12-19 MED ORDER — SODIUM CHLORIDE 0.9 % IV SOLN: 250.0000 mL | INTRAVENOUS | Status: DC | PRN

## 2022-12-19 MED ORDER — ETOMIDATE 2 MG/ML IV SOLN
INTRAVENOUS | Status: DC | PRN
  Administered 2022-12-19: 20 mg via INTRAVENOUS

## 2022-12-19 MED ORDER — INSULIN ASPART 100 UNIT/ML IJ SOLN
0.0000 [IU] | Freq: Every day | INTRAMUSCULAR | Status: DC
  Administered 2022-12-19: 2 [IU] via SUBCUTANEOUS

## 2022-12-19 MED ORDER — PROTAMINE SULFATE 10 MG/ML IV SOLN
INTRAVENOUS | Status: DC | PRN
  Administered 2022-12-19: 20 mg via INTRAVENOUS
  Administered 2022-12-19: 15 mg via INTRAVENOUS

## 2022-12-19 MED ORDER — APIXABAN 5 MG PO TABS
5.0000 mg | ORAL_TABLET | Freq: Two times a day (BID) | ORAL | Status: DC
  Administered 2022-12-19 – 2022-12-22 (×6): 5 mg via ORAL
  Filled 2022-12-19 (×6): qty 1

## 2022-12-19 MED ORDER — ACETAMINOPHEN 325 MG PO TABS: 650.0000 mg | ORAL_TABLET | ORAL | Status: DC | PRN

## 2022-12-19 SURGICAL SUPPLY — 18 items
BAG SNAP BAND KOVER 36X36 (MISCELLANEOUS) IMPLANT
CATH ABLAT QDOT MICRO BI TC DF (CATHETERS) IMPLANT
CATH OCTARAY 2.0 F 3-3-3-3-3 (CATHETERS) IMPLANT
CATH S-M CIRCA TEMP PROBE (CATHETERS) IMPLANT
CATH SOUNDSTAR ECO 8FR (CATHETERS) IMPLANT
CATH WEB BI DIR CSDF CRV REPRO (CATHETERS) IMPLANT
CLOSURE PERCLOSE PROSTYLE (VASCULAR PRODUCTS) IMPLANT
COVER SWIFTLINK CONNECTOR (BAG) ×1 IMPLANT
PACK EP LATEX FREE (CUSTOM PROCEDURE TRAY) ×1
PACK EP LF (CUSTOM PROCEDURE TRAY) ×1 IMPLANT
PAD DEFIB RADIO PHYSIO CONN (PAD) ×1 IMPLANT
PATCH CARTO3 (PAD) IMPLANT
SHEATH BAYLIS TRANSSEPTAL 98CM (NEEDLE) IMPLANT
SHEATH CARTO VIZIGO SM CVD (SHEATH) IMPLANT
SHEATH PINNACLE 8F 10CM (SHEATH) IMPLANT
SHEATH PINNACLE 9F 10CM (SHEATH) IMPLANT
SHEATH PROBE COVER 6X72 (BAG) IMPLANT
TUBING SMART ABLATE COOLFLOW (TUBING) IMPLANT

## 2022-12-19 NOTE — H&P (Signed)
Electrophysiology Office Follow up Visit Note:     Date:  12/19/2022    ID:  Colleen [name], DOB Nov 01, 1965, MRN [id number]   PCP:  Orpah Melter, MD   [facility] Cardiologist:  None  [facility] Electrophysiologist:  Vickie Epley, MD      Interval History:     Colleen [name] is a 57 y.o. female who presents for a follow up visit.    She sees Dr. Virgina Jock.  Last appointment with him was August 11, 2022.  I last saw the patient April 22, 2021.  The patient has a history of paroxysmal atrial fibrillation.  She was previously on anticoagulation but this was stopped by [facility] cardiology after her surgery.   Her pacemaker has shown brief salvos of atrial fibrillation.  Phone note from October 07, 2022 discusses a 13-hour long episode of atrial fibrillation.  She is not on anticoagulation but this was started by Dr. Virgina Jock.  The patient reports some bleeding around her insulin pump while on anticoagulation.  She is referred to discuss left atrial appendage closure.  Of note, she does have a history of left atrial appendage ligation during previous open heart surgery.   On December 05, 2021,CABG x 3 (LIMA-LAD, SVG-RCA, SVG-OM2), LAA Clip, MAZE.   Today, she reports that she has been struggling with her A flutter. She is unsure of how symptomatic she is. She is concerned about her Eliquis 5 MG due to her insulin pump injections. She struggles to balance these two. She reports that she restarted blood thinners the Monday after her interrogation date.   She reports that her last creatine was 1.75 and that she recently lost 25 pounds. She will FU with her cardiologist in March 2024.   She denies any chest pain, shortness of breath, or peripheral edema. No lightheadedness, headaches, syncope, orthopnea, or PND.   She reports that she was very sick during around Thanksgiving time. She believes she was infected with RSV at the time.   Presents for PVI today. Procedure reviewed.        Past Medical History:  Diagnosis Date   Asthma     Complication of anesthesia      Psuedocholinerasterase deficiency per pt   Diabetes mellitus      TYPE II   Diabetic macular edema ([place])      Pt gets injections in eyes with Eyelea   Seasonal allergies             Past Surgical History:  Procedure Laterality Date   BACK SURGERY   2009   CATARACT SURGERY        BILATERAL   CESAREAN SECTION        X2   CORONARY ARTERY BYPASS GRAFT   12/05/2021   KNEE SURGERY Right     LEFT HEART CATH AND CORONARY ANGIOGRAPHY N/A 05/08/2017    Procedure: Left Heart Cath and Coronary Angiography;  Surgeon: Nigel Mormon, MD;  Location: [place] [facility];  Service: Cardiovascular;  Laterality: N/A;   ORIF ANKLE FRACTURE Left 03/23/2016    Procedure: OPEN REDUCTION INTERNAL FIXATION (ORIF) ANKLE FRACTURE MALLEOLUS  MALUNION WITH TIBIAL AND FIBULAR OSTEOTOMY AND AUTOGRAFT;  Surgeon: Wylene Simmer, MD;  Location: [place];  Service: Orthopedics;  Laterality: Left;   PACEMAKER IMPLANT N/A 01/06/2021    Procedure: PACEMAKER IMPLANT;  Surgeon: Vickie Epley, MD;  Location: [place] [facility];  Service: Cardiovascular;  Laterality: N/A;   RIGHT/LEFT HEART CATH  AND CORONARY ANGIOGRAPHY N/A 12/24/2020    Procedure: RIGHT/LEFT HEART CATH AND CORONARY ANGIOGRAPHY;  Surgeon: Nigel Mormon, MD;  Location: [place] [facility];  Service: Cardiovascular;  Laterality: N/A;   SHOULDER ARTHROSCOPY W/ ROTATOR CUFF REPAIR Left     TEMPORARY PACEMAKER N/A 12/19/2020    Procedure: TEMPORARY PACEMAKER;  Surgeon: Nigel Mormon, MD;  Location: [place] [facility];  Service: Cardiovascular;  Laterality: N/A;   TEMPORARY PACEMAKER N/A 12/19/2020    Procedure: TEMPORARY PACEMAKER;  Surgeon: Nigel Mormon, MD;  Location: [place] [facility];  Service: Cardiovascular;  Laterality: N/A;   TEMPORARY PACEMAKER N/A 01/02/2021    Procedure: TEMPORARY PACEMAKER;  Surgeon: Martinique, Peter M,  MD;  Location: [place] [facility];  Service: Cardiovascular;  Laterality: N/A;      Current Medications: Active Medications  No outpatient medications have been marked as taking for the 10/23/22 encounter (Appointment) with Vickie Epley, MD.        Allergies:   Iodine, Iohexol, Other, and Succinylcholine    Social History         Socioeconomic History   Marital status: Married      Spouse name: Not on file   Number of children: 2   Years of education: Not on file   Highest education level: Not on file  Occupational History   Not on file  Tobacco Use   Smoking status: Former      Packs/day: 0.25      Years: 2.00      Total pack years: 0.50      Types: Cigarettes      Quit date: 01/17/1987      Years since quitting: 35.7   Smokeless tobacco: Never  Vaping Use   Vaping Use: Never used  Substance and Sexual Activity   Alcohol use: Yes      Comment: occ   Drug use: No   Sexual activity: Yes      Comment: PATIENT'S PARTNER WITH VASECTOMY  Other Topics Concern   Not on file  Social History Narrative   Not on file    Social Determinants of Health    Financial Resource Strain: Not on file  Food Insecurity: Not on file  Transportation Needs: Not on file  Physical Activity: Not on file  Stress: Not on file  Social Connections: Not on file      Family History: The patient's family history includes Colon cancer (age of onset: 105) in her father; Hypertension in her father, paternal aunt, paternal grandfather, and paternal grandmother.   ROS:   Please see the history of present illness.      All other systems reviewed and are negative.   EKGs/Labs/Other Studies Reviewed:     The following studies were reviewed today:   October 23, 2022 in clinic device interrogation personally reviewed Battery longevity 6.4 years Lead parameter stable Ventricular pacing 93% AF burden 7.4% total although persistent for the last several weeks on the trend Atrial high rate  episodes appear to be in atrial flutter     Recent Labs: 04/20/2022: BNP 302.0 05/04/2022: BUN 34; Creatinine, Ser 1.46; Potassium 4.3; Sodium 140  Recent Lipid Panel Labs (Brief)          Component Value Date/Time    CHOL 118 01/02/2021 0136    CHOL 164 10/08/2019 0901    TRIG 80 01/03/2021 0527    HDL 42 01/02/2021 0136    HDL 99 10/08/2019 0901    CHOLHDL  2.8 01/02/2021 0136    VLDL 42 (H) 01/02/2021 0136    LDLCALC 34 01/02/2021 0136    LDLCALC 54 10/08/2019 0901    LDLDIRECT 61 10/08/2019 0901        Physical Exam:     VS:  157/82, 70, 15, AF        Wt Readings from Last 3 Encounters:  08/11/22 197 lb (89.4 kg)  05/11/22 214 lb (97.1 kg)  04/14/22 214 lb 12.5 oz (97.4 kg)      GEN:  Well nourished, well developed in no acute distress CARDIAC: RRR, no murmurs, rubs, gallops, Device pocket well healed. RESPIRATORY:  Clear to auscultation without rales, wheezing or rhonchi          Assessment ASSESSMENT:     1. Persistent atrial fibrillation ([place])   2. CHB (complete heart block) (HCC)   3. Cardiac pacemaker in situ   4. Coronary artery disease involving native coronary artery of native heart with other form of angina pectoris ([place])     PLAN:     In order of problems listed above:   #Persistent atrial fibrillation History of Maze procedure during bypass surgery, now with recurrence of arrhythmia.  She has been in persistent atrial fibrillation since the middle of January.     We first discussed treatment options for the recurrent arrhythmia.  We discussed antiarrhythmic drugs including amiodarone and dofetilide.  Given her young age I would like to avoid amiodarone.  Tikosyn will be tricky given her marginal kidney function.  If she were to be able to document stable kidney function on several checks, would feel comfortable proceeding with Tikosyn initiation.  We also discussed catheter ablation post maze.  We discussed the pathophysiology of post maze atrial  flutter.  I discussed the procedural details including the risks, recovery, and likelihood of success.  We discussed the possibility that she would still require antiarrhythmic drugs following a post maze atrial flutter ablation.   The second issue we discussed today is her anticoagulation.  She really would like to stop her anticoagulant given problems with bleeding around her insulin pump site.  I told her before I would feel comfortable stopping her anticoagulation I will need to assess the closure of the left atrial appendage from her open heart surgery.  This will be done with a TEE.  We can either do this TEE at the time of a cardioversion or before an ablation procedure.  We discussed the possible outcomes from a TEE and left atrial appendage clip.  If the appendage were to demonstrate successful closure, we discussed the data surrounding safety of anticoagulation discontinuation.   She would like to think about her options which I think is very reasonable and she will let us know how she would like to proceed.   If she were to decide to proceed with catheter ablation, would not plan for a CT PE protocol given her marginal kidney function.  Would need a TEE on the table to both assess the left atrial anatomy and left atrial appendage patency.   #Complete heart block #Permanent pacemaker in situ Device functioning appropriately.  Continue remote monitoring.   Presents for PVI. Procedure reviewed.   Signed, Lars Mage, MD, [facility], [facility] 12/19/2022 Electrophysiology [facility]

## 2022-12-19 NOTE — Anesthesia Preprocedure Evaluation (Addendum)
Anesthesia Evaluation  Patient identified by MRN, date of birth, ID band Patient awake    Reviewed: Allergy & Precautions, NPO status , Patient's Chart, lab work & pertinent test results, reviewed documented beta blocker date and time   History of Anesthesia Complications (+) PSEUDOCHOLINESTERASE DEFICIENCY and history of anesthetic complications  Airway Mallampati: I  TM Distance: >3 FB Neck ROM: Full    Dental  (+) Dental Advisory Given, Teeth Intact, Caps   Pulmonary asthma , former smoker   breath sounds clear to auscultation       Cardiovascular hypertension, Pt. on medications and Pt. on home beta blockers (-) angina + CAD, + CABG and +CHF (Entresto)  + dysrhythmias Atrial Fibrillation + pacemaker  Rhythm:Regular Rate:Normal  '23 ECHO: Mild concentric hypertrophy of LV. Abnormal septal wall motion due to post-operative coronary artery bypass graft. Mild global hypokinesis. LVEF 50-55%.  Doppler evidence of grade II (pseudonormal) diastolic dysfunction,  elevated LAP.  Right ventricle cavity not well visualized, but at least mildly dilated with at least mildly reduced systolic function.  Device lead is seen.  Left atrial cavity is mildly dilated.  Right atrial cavity is mild to moderately dilated.  Mild (Grade I) MR.  Moderate TR. Estimated pulmonary artery systolic pressure at least 26 mmHg.     Neuro/Psych negative neurological ROS     GI/Hepatic Neg liver ROS,GERD  Medicated and Controlled,,  Endo/Other  diabetes (glu 110), Insulin Dependent  Ozempic: last dose 3/18  Renal/GU Renal InsufficiencyRenal disease     Musculoskeletal   Abdominal   Peds  Hematology eliquis   Anesthesia Other Findings   Reproductive/Obstetrics                             Anesthesia Physical Anesthesia Plan  ASA: 3  Anesthesia Plan: General   Post-op Pain Management: Tylenol PO (pre-op)*    Induction: Intravenous  PONV Risk Score and Plan: 3 and Ondansetron, Dexamethasone and Scopolamine patch - Pre-op  Airway Management Planned: Oral ETT  Additional Equipment: None  Intra-op Plan:   Post-operative Plan: Extubation in OR  Informed Consent: I have reviewed the patients History and Physical, chart, labs and discussed the procedure including the risks, benefits and alternatives for the proposed anesthesia with the patient or authorized representative who has indicated his/her understanding and acceptance.     Dental advisory given  Plan Discussed with: CRNA and Surgeon  Anesthesia Plan Comments:         Anesthesia Quick Evaluation

## 2022-12-19 NOTE — Anesthesia Procedure Notes (Signed)
Procedure Name: Intubation Date/Time: 12/19/2022 2:16 PM  Performed by: Glynda Jaeger, CRNAPre-anesthesia Checklist: Patient identified, Patient being monitored, Timeout performed, Emergency Drugs available and Suction available Patient Re-evaluated:Patient Re-evaluated prior to induction Oxygen Delivery Method: Circle System Utilized Preoxygenation: Pre-oxygenation with 100% oxygen Induction Type: IV induction Ventilation: Mask ventilation without difficulty Laryngoscope Size: Mac and 4 Grade View: Grade II Tube type: Oral Tube size: 7.0 mm Number of attempts: 1 Airway Equipment and Method: Stylet Placement Confirmation: ETT inserted through vocal cords under direct vision, positive ETCO2 and breath sounds checked- equal and bilateral Secured at: 22 cm Tube secured with: Tape Dental Injury: Teeth and Oropharynx as per pre-operative assessment

## 2022-12-19 NOTE — Transfer of Care (Signed)
Immediate Anesthesia Transfer of Care Note  Patient: Colleen [name]  Procedure(s) Performed: ATRIAL FIBRILLATION ABLATION TRANSESOPHAGEAL ECHOCARDIOGRAM  Patient Location: Cath Lab  Anesthesia Type:General  Level of Consciousness: awake, alert , and drowsy  Airway & Oxygen Therapy: Patient Spontanous Breathing and Patient connected to nasal cannula oxygen  Post-op Assessment: Report given to RN, Post -op Vital signs reviewed and stable, and Patient moving all extremities X 4  Post vital signs: Reviewed and stable  Last Vitals:  Vitals Value Taken Time  BP 117/57 12/19/22 1722  Temp    Pulse 75 12/19/22 1724  Resp 0 12/19/22 1724  SpO2 93 % 12/19/22 1724  Vitals shown include unvalidated device data.  Last Pain:  Vitals:   12/19/22 1253  TempSrc:   PainSc: 0-No pain         Complications: There were no known notable events for this encounter.

## 2022-12-20 ENCOUNTER — Encounter (HOSPITAL_COMMUNITY): Payer: Self-pay | Admitting: Cardiology

## 2022-12-20 DIAGNOSIS — K219 Gastro-esophageal reflux disease without esophagitis: Secondary | ICD-10-CM | POA: Diagnosis present

## 2022-12-20 DIAGNOSIS — E669 Obesity, unspecified: Secondary | ICD-10-CM | POA: Diagnosis present

## 2022-12-20 DIAGNOSIS — E139 Other specified diabetes mellitus without complications: Secondary | ICD-10-CM | POA: Insufficient documentation

## 2022-12-20 DIAGNOSIS — Z7985 Long-term (current) use of injectable non-insulin antidiabetic drugs: Secondary | ICD-10-CM | POA: Diagnosis not present

## 2022-12-20 DIAGNOSIS — E10311 Type 1 diabetes mellitus with unspecified diabetic retinopathy with macular edema: Secondary | ICD-10-CM | POA: Diagnosis present

## 2022-12-20 DIAGNOSIS — I483 Typical atrial flutter: Secondary | ICD-10-CM | POA: Diagnosis present

## 2022-12-20 DIAGNOSIS — I130 Hypertensive heart and chronic kidney disease with heart failure and stage 1 through stage 4 chronic kidney disease, or unspecified chronic kidney disease: Secondary | ICD-10-CM | POA: Diagnosis present

## 2022-12-20 DIAGNOSIS — I4719 Other supraventricular tachycardia: Secondary | ICD-10-CM

## 2022-12-20 DIAGNOSIS — I442 Atrioventricular block, complete: Secondary | ICD-10-CM | POA: Diagnosis present

## 2022-12-20 DIAGNOSIS — I4891 Unspecified atrial fibrillation: Secondary | ICD-10-CM | POA: Diagnosis present

## 2022-12-20 DIAGNOSIS — Z7901 Long term (current) use of anticoagulants: Secondary | ICD-10-CM | POA: Diagnosis not present

## 2022-12-20 DIAGNOSIS — N1831 Chronic kidney disease, stage 3a: Secondary | ICD-10-CM | POA: Diagnosis not present

## 2022-12-20 DIAGNOSIS — I4819 Other persistent atrial fibrillation: Secondary | ICD-10-CM | POA: Diagnosis present

## 2022-12-20 DIAGNOSIS — E1022 Type 1 diabetes mellitus with diabetic chronic kidney disease: Secondary | ICD-10-CM | POA: Diagnosis present

## 2022-12-20 DIAGNOSIS — E1065 Type 1 diabetes mellitus with hyperglycemia: Secondary | ICD-10-CM | POA: Diagnosis not present

## 2022-12-20 DIAGNOSIS — Z6832 Body mass index (BMI) 32.0-32.9, adult: Secondary | ICD-10-CM | POA: Diagnosis not present

## 2022-12-20 DIAGNOSIS — Z87891 Personal history of nicotine dependence: Secondary | ICD-10-CM | POA: Diagnosis not present

## 2022-12-20 DIAGNOSIS — I480 Paroxysmal atrial fibrillation: Secondary | ICD-10-CM | POA: Diagnosis not present

## 2022-12-20 DIAGNOSIS — J45909 Unspecified asthma, uncomplicated: Secondary | ICD-10-CM | POA: Diagnosis present

## 2022-12-20 DIAGNOSIS — I2510 Atherosclerotic heart disease of native coronary artery without angina pectoris: Secondary | ICD-10-CM | POA: Diagnosis not present

## 2022-12-20 DIAGNOSIS — E1051 Type 1 diabetes mellitus with diabetic peripheral angiopathy without gangrene: Secondary | ICD-10-CM | POA: Diagnosis present

## 2022-12-20 DIAGNOSIS — N179 Acute kidney failure, unspecified: Secondary | ICD-10-CM | POA: Diagnosis not present

## 2022-12-20 DIAGNOSIS — I959 Hypotension, unspecified: Secondary | ICD-10-CM | POA: Diagnosis not present

## 2022-12-20 DIAGNOSIS — I25118 Atherosclerotic heart disease of native coronary artery with other forms of angina pectoris: Secondary | ICD-10-CM | POA: Diagnosis present

## 2022-12-20 DIAGNOSIS — Z79899 Other long term (current) drug therapy: Secondary | ICD-10-CM | POA: Diagnosis not present

## 2022-12-20 DIAGNOSIS — N1832 Chronic kidney disease, stage 3b: Secondary | ICD-10-CM | POA: Diagnosis present

## 2022-12-20 DIAGNOSIS — Z9641 Presence of insulin pump (external) (internal): Secondary | ICD-10-CM | POA: Diagnosis present

## 2022-12-20 DIAGNOSIS — Z951 Presence of aortocoronary bypass graft: Secondary | ICD-10-CM | POA: Diagnosis not present

## 2022-12-20 DIAGNOSIS — I5032 Chronic diastolic (congestive) heart failure: Secondary | ICD-10-CM | POA: Diagnosis present

## 2022-12-20 LAB — BASIC METABOLIC PANEL
Anion gap: 12 (ref 5–15)
BUN: 42 mg/dL — ABNORMAL HIGH (ref 6–20)
CO2: 19 mmol/L — ABNORMAL LOW (ref 22–32)
Calcium: 8.4 mg/dL — ABNORMAL LOW (ref 8.9–10.3)
Chloride: 100 mmol/L (ref 98–111)
Creatinine, Ser: 2.49 mg/dL — ABNORMAL HIGH (ref 0.44–1.00)
GFR, Estimated: 22 mL/min — ABNORMAL LOW (ref >60)
Glucose, Bld: 447 mg/dL — ABNORMAL HIGH (ref 70–99)
Potassium: 4.3 mmol/L (ref 3.5–5.1)
Sodium: 131 mmol/L — ABNORMAL LOW (ref 135–145)

## 2022-12-20 LAB — GLUCOSE, CAPILLARY
Glucose-Capillary: 436 mg/dL — ABNORMAL HIGH (ref 70–99)
Glucose-Capillary: 483 mg/dL — ABNORMAL HIGH (ref 70–99)
Glucose-Capillary: 438 mg/dL — ABNORMAL HIGH (ref 70–99)
Glucose-Capillary: 391 mg/dL — ABNORMAL HIGH (ref 70–99)
Glucose-Capillary: 314 mg/dL — ABNORMAL HIGH (ref 70–99)
Glucose-Capillary: 219 mg/dL — ABNORMAL HIGH (ref 70–99)

## 2022-12-20 LAB — BETA-HYDROXYBUTYRIC ACID: Beta-Hydroxybutyric Acid: 0.28 mmol/L — ABNORMAL HIGH (ref 0.05–0.27)

## 2022-12-20 MED ORDER — MECLIZINE HCL 25 MG PO TABS
12.5000 mg | ORAL_TABLET | Freq: Two times a day (BID) | ORAL | Status: DC | PRN
  Administered 2022-12-20: 12.5 mg via ORAL
  Filled 2022-12-20: qty 1

## 2022-12-20 MED ORDER — AMIODARONE LOAD VIA INFUSION
150.0000 mg | Freq: Once | INTRAVENOUS | Status: AC
  Administered 2022-12-20: 150 mg via INTRAVENOUS
  Filled 2022-12-20: qty 83.34

## 2022-12-20 MED ORDER — INSULIN GLARGINE-YFGN 100 UNIT/ML ~~LOC~~ SOLN
28.0000 [IU] | Freq: Every day | SUBCUTANEOUS | Status: DC
  Administered 2022-12-20 – 2022-12-21 (×2): 28 [IU] via SUBCUTANEOUS
  Filled 2022-12-20 (×2): qty 0.28

## 2022-12-20 MED ORDER — AMIODARONE HCL IN DEXTROSE 360-4.14 MG/200ML-% IV SOLN
30.0000 mg/h | INTRAVENOUS | Status: AC
  Administered 2022-12-20 – 2022-12-21 (×3): 30 mg/h via INTRAVENOUS
  Filled 2022-12-20 (×2): qty 200

## 2022-12-20 MED ORDER — PROCHLORPERAZINE EDISYLATE 10 MG/2ML IJ SOLN
10.0000 mg | Freq: Four times a day (QID) | INTRAMUSCULAR | Status: DC | PRN
  Administered 2022-12-20 (×2): 10 mg via INTRAVENOUS
  Filled 2022-12-20 (×2): qty 2

## 2022-12-20 MED ORDER — INSULIN ASPART 100 UNIT/ML IJ SOLN
0.0000 [IU] | INTRAMUSCULAR | Status: DC
  Administered 2022-12-20: 11 [IU] via SUBCUTANEOUS
  Administered 2022-12-20 – 2022-12-21 (×5): 5 [IU] via SUBCUTANEOUS
  Administered 2022-12-21: 2 [IU] via SUBCUTANEOUS
  Administered 2022-12-22: 3 [IU] via SUBCUTANEOUS

## 2022-12-20 MED ORDER — AMIODARONE HCL IN DEXTROSE 360-4.14 MG/200ML-% IV SOLN
60.0000 mg/h | INTRAVENOUS | Status: AC
  Administered 2022-12-20 (×2): 60 mg/h via INTRAVENOUS
  Filled 2022-12-20 (×2): qty 200

## 2022-12-20 NOTE — Progress Notes (Signed)
  Transition of Care [facility]) Screening Note   Patient Details  Name: Colleen [name] Date of Birth: 28-Aug-1966   Transition of Care [facility]) CM/SW Contact:    Pollie Friar, RN Phone Number: 12/20/2022, 2:55 PM   Pt is from home. Currently on Amio gtt.  Transition of Care Department [facility]) has reviewed patient. We will continue to monitor patient advancement through interdisciplinary progression rounds. If new patient transition needs arise, please place a TOC consult.

## 2022-12-20 NOTE — Anesthesia Postprocedure Evaluation (Signed)
Anesthesia Post Note  Patient: Colleen [name]  Procedure(s) Performed: ATRIAL FIBRILLATION ABLATION TRANSESOPHAGEAL ECHOCARDIOGRAM     Patient location during evaluation: PACU Anesthesia Type: General Level of consciousness: awake and alert Pain management: pain level controlled Vital Signs Assessment: post-procedure vital signs reviewed and stable Respiratory status: spontaneous breathing, nonlabored ventilation and respiratory function stable Cardiovascular status: stable and blood pressure returned to baseline Anesthetic complications: no   There were no known notable events for this encounter.  Last Vitals:  Vitals:   12/20/22 0519 12/20/22 0749  BP: (!) 89/49 (!) 89/41  Pulse: 88 78  Resp: 16 17  Temp:  36.8 C  SpO2: 99% 99%    Last Pain:  Vitals:   12/20/22 0830  TempSrc:   PainSc: 0-No pain                 Audry Pili

## 2022-12-20 NOTE — Progress Notes (Signed)
  Patient Name: Colleen [name] Date of Encounter: 12/20/2022  Primary Cardiologist: None Electrophysiologist: Vickie Epley, MD  Interval Summary   Nauseated this am. BG 436  Inpatient Medications    Scheduled Meds:  apixaban  5 mg Oral BID   insulin aspart  0-15 Units Subcutaneous TID WC   insulin aspart  0-5 Units Subcutaneous QHS   rosuvastatin  10 mg Oral QHS   sodium chloride flush  3 mL Intravenous Q12H   Continuous Infusions:  sodium chloride     amiodarone 60 mg/hr (12/20/22 0652)   Followed by   amiodarone     PRN Meds: sodium chloride, acetaminophen, meclizine, ondansetron (ZOFRAN) IV, sodium chloride flush   Vital Signs    Vitals:   12/19/22 2100 12/20/22 0017 12/20/22 0438 12/20/22 0519  BP: 128/79 123/65 (!) 90/48 (!) 89/49  Pulse: 84 83  88  Resp: 14 15 16 16   Temp:  97.9 F (36.6 C)    TempSrc:  Oral    SpO2: 100%  99% 99%  Weight:      Height:        Intake/Output Summary (Last 24 hours) at 12/20/2022 0726 Last data filed at 12/19/2022 1418 Gross per 24 hour  Intake 50 ml  Output --  Net 50 ml   Filed Weights   12/19/22 1213  Weight: 83.5 kg    Physical Exam    GEN- The patient is well appearing, alert and oriented x 3 today.   Lungs- Clear to ausculation bilaterally, normal work of breathing Cardiac- Regular rate and rhythm, no murmurs, rubs or gallops GI- soft, NT, ND, + BS Extremities- no clubbing or cyanosis. No edema  Telemetry    Appears to show 2 separate rhythms, one narrow, more likely a junctional escape, and another wide QRS felt to potentially be a conducted AFL. Rates 80-90s (personally reviewed)  [name] is a 57 y.o. female with CAD s/p CABG, PAF, h/o CHB s/p Abbot PPM admitted for planned AF ablation.    Assessment & Plan    Persistent atrial fibrillation Stable groin sites this am.  Eliquis continued.   ?Junctional rhythm ?Atrial flutter As above has alternating narrow and  wide complexes on the tele felt to be junctional rhythm and conducted atrial flutter, respectively.  These may have also been contributing to her symptoms.  With significant atriopathy by prior EP assessment during PPM implant, AAD is most likely to help. Amiodarone started this am. Continue IV load.   Type 1 DM On Insulin pump at home.  Sliding scale (moderate) has not been effective thus far. Will request Diabetes coordinator assessment and recommendations.    For questions or updates, please contact [facility] Please consult [email or web address] for contact info under Cardiology/STEMI.  Signed, [name], PA-C  12/20/2022, 7:26 AM

## 2022-12-20 NOTE — Consult Note (Signed)
Consultation Note   Patient: Colleen [name] [id number] DOB: 05-22-1966 PCP: Orpah Melter, MD DOA: 12/19/2022 DOS: the patient was seen and examined on 12/20/2022 Primary service: Vickie Epley, MD  Referring physician: [name], [place]. Reason for consult: Persistent Hyperglycemia Management, Potential Hyperglycemic Emergency vs Prevention thereof  HPI: Colleen [name] is a 57 y.o. female with medical history significant of atrial fibrillation, complete heart block, diabetes, CHF, GERD, hypertension, CAD, venous insufficiency, asthma presenting yesterday to electrophysiology service for or possible left atrial appendage closure.  Patient has had history of A-fib/flutter with prior Maze procedure performed during previous CABG.  Has had ongoing intermittent issues with A-fib/a flutter as well as bleeding from her anticoagulation around her insulin pump site.  She presented for consideration of maze procedure versus antiarrhythmic.  We have been consulted for assistance with hyperglycemia and prevention versus treatment of possible hyperglycemic emergency.  During the course of her admission her insulin pump was held and she was placed on sign scale insulin.  She additionally received a dose of Decadron for postoperative nausea and vomiting prevention.  Her blood sugars have subsequently risen from the 200s down to the 400s.  Diabetic coordinator consulted and noted patient's insulin pump settings include basal insulin totaling 26.4 units/day.  And has history of additional 1 unit per 1 g of carb approximately as well with his history of a drop of approximately 40 and her CBG per 1 unit additional given.  Thus far to address her current elevated CBG.  She recently received the full 15 units of her sliding scale and was started on a 28 daily dose of long-acting insulin starting shortly.  Review of Systems: As per HPI otherwise all other systems reviewed and are negative.  Past  Medical History:  Diagnosis Date   Acute on chronic right heart failure ([place]) 01/27/2021   Acute respiratory failure with hypoxemia (HCC)    AKI (acute kidney injury) ([place])    Asthma    Chest pain    Complication of anesthesia    Psuedocholinerasterase deficiency per pt   Diabetes mellitus    TYPE II   Diabetic macular edema (HCC)    Pt gets injections in eyes with Eye[facility]a   Paroxysmal atrial flutter ([place]) 12/28/2020   Seasonal allergies     Past Surgical History:  Procedure Laterality Date   ATRIAL FIBRILLATION ABLATION N/A 12/19/2022   Procedure: ATRIAL FIBRILLATION ABLATION;  Surgeon: Vickie Epley, MD;  Location: [place] [facility];  Service: Cardiovascular;  Laterality: N/A;   BACK SURGERY  2009   CATARACT SURGERY     BILATERAL   CESAREAN SECTION     X2   CORONARY ARTERY BYPASS GRAFT  12/05/2021   KNEE SURGERY Right    LEFT HEART CATH AND CORONARY ANGIOGRAPHY N/A 05/08/2017   Procedure: Left Heart Cath and Coronary Angiography;  Surgeon: Nigel Mormon, MD;  Location: [place] [facility];  Service: Cardiovascular;  Laterality: N/A;   ORIF ANKLE FRACTURE Left 03/23/2016   Procedure: OPEN REDUCTION INTERNAL FIXATION (ORIF) ANKLE FRACTURE MALLEOLUS  MALUNION WITH TIBIAL AND FIBULAR OSTEOTOMY AND AUTOGRAFT;  Surgeon: Wylene Simmer, MD;  Location: [place];  Service: Orthopedics;  Laterality: Left;   PACEMAKER IMPLANT N/A 01/06/2021   Procedure: PACEMAKER IMPLANT;  Surgeon: Vickie Epley, MD;  Location: [place] [facility];  Service: Cardiovascular;  Laterality: N/A;   RIGHT/LEFT HEART CATH AND CORONARY ANGIOGRAPHY N/A 12/24/2020   Procedure: RIGHT/LEFT HEART CATH AND CORONARY ANGIOGRAPHY;  Surgeon: Nigel Mormon, MD;  Location: [place] [facility];  Service: Cardiovascular;  Laterality: N/A;   SHOULDER ARTHROSCOPY W/ ROTATOR CUFF REPAIR Left    TEE WITHOUT CARDIOVERSION N/A 12/19/2022   Procedure: TRANSESOPHAGEAL ECHOCARDIOGRAM;  Surgeon:  Vickie Epley, MD;  Location: [place] [facility];  Service: Cardiovascular;  Laterality: N/A;   TEMPORARY PACEMAKER N/A 12/19/2020   Procedure: TEMPORARY PACEMAKER;  Surgeon: Nigel Mormon, MD;  Location: [place] [facility];  Service: Cardiovascular;  Laterality: N/A;   TEMPORARY PACEMAKER N/A 12/19/2020   Procedure: TEMPORARY PACEMAKER;  Surgeon: Nigel Mormon, MD;  Location: [place] [facility];  Service: Cardiovascular;  Laterality: N/A;   TEMPORARY PACEMAKER N/A 01/02/2021   Procedure: TEMPORARY PACEMAKER;  Surgeon: Martinique, Peter M, MD;  Location: [place] [facility];  Service: Cardiovascular;  Laterality: N/A;    Social History  reports that she quit smoking about 35 years ago. Her smoking use included cigarettes. She has a 0.50 pack-year smoking history. She has never used smokeless tobacco. She reports current alcohol use. She reports that she does not use drugs.  Allergies  Allergen Reactions   Gadolinium Derivatives      Kidney disease   Iodine Other (See Comments)   Iohexol Other (See Comments)   Nsaids     Other Reaction(s): Kidney disease   Other Other (See Comments)    Contrast Dye   Succinylcholine Other (See Comments)    Pseudocholinesterase deficiency    Family History  Problem Relation Age of Onset   Hypertension Father    Colon cancer Father 35   Hypertension Paternal Aunt    Hypertension Paternal Grandmother    Hypertension Paternal Grandfather   Reviewed on admission  Prior to Admission medications   Medication Sig Start Date End Date Taking? Authorizing Provider  albuterol (PROVENTIL HFA;VENTOLIN HFA) 108 (90 Base) MCG/ACT inhaler Inhale 1-2 puffs into the lungs every 6 (six) hours as needed for wheezing or shortness of breath (Asthma).   Yes [provider]  apixaban (ELIQUIS) 5 MG TABS tablet Take 1 tablet (5 mg total) by mouth 2 (two) times daily. 10/11/22  Yes Patwardhan, Reynold Bowen, MD  Cholecalciferol (VITAMIN D) 2000 units  tablet Take 4,000 Units by mouth daily.   Yes [provider]  Cyanocobalamin (B-12) 2500 MCG SUBL Place 2,500 mcg under the tongue daily.   Yes [provider]  ENTRESTO 24-26 MG TAKE 1 TABLET BY MOUTH  TWICE DAILY 07/27/22  Yes Patwardhan, Manish J, MD  famotidine (PEPCID) 20 MG tablet Take 1 tablet (20 mg total) by mouth 2 (two) times daily. Patient taking differently: Take 20 mg by mouth daily as needed for heartburn or indigestion. 08/11/22  Yes Patwardhan, Manish J, MD  folic acid (FOLVITE) Q000111Q MCG tablet Take 800 mcg by mouth daily.   Yes [provider]  insulin lispro (HUMALOG) 100 UNIT/ML injection Inject into the skin See admin instructions. Insulin Pump - Mini Medtronic 670G   Yes [provider]  loratadine (CLARITIN) 10 MG tablet Take 10 mg by mouth daily as needed for allergies.   Yes [provider]  metoprolol succinate (TOPROL-XL) 25 MG 24 hr tablet Take 25 mg by mouth daily. Take with or immediately following a meal.   Yes [provider]  nitroGLYCERIN (NITROSTAT) 0.4 MG SL tablet Place 1 tablet (0.4 mg total) under the tongue every 5 (five) minutes x 3 doses as needed for chest pain. 12/29/20  Yes Patwardhan, Manish J,  MD  OZEMPIC, 0.25 OR 0.5 MG/DOSE, 2 MG/3ML SOPN Inject 0.375 mg into the skin every Monday. 08/03/22  Yes [provider]  rosuvastatin (CRESTOR) 10 MG tablet TAKE 1 TABLET BY MOUTH AT  BEDTIME 07/27/22  Yes Patwardhan, Manish J, MD  tobramycin (TOBREX) 0.3 % ophthalmic solution Place 2 drops into both eyes as needed (prior to eye injections every 4 months). 12/03/18  Yes [provider]  torsemide (DEMADEX) 20 MG tablet TAKE 1 TABLET BY MOUTH  TWICE DAILY AS NEEDED FOR  LEG SWELLING 03/13/22  Yes Nigel Mormon, MD    Physical Exam: Vitals:   12/20/22 0438 12/20/22 0519 12/20/22 0749 12/20/22 1214  BP: (!) 90/48 (!) 89/49 (!) 89/41 109/67  Pulse:  88 78 73  Resp: 16 16 17 17   Temp:   98.2  F (36.8 C) 97.8 F (36.6 C)  TempSrc:   Oral Oral  SpO2: 99% 99% 99% 99%  Weight:      Height:        Physical Exam Constitutional:      General: She is not in acute distress.    Appearance: Normal appearance.  HENT:     Head: Normocephalic and atraumatic.     Mouth/Throat:     Mouth: Mucous membranes are moist.     Pharynx: Oropharynx is clear.  Eyes:     Extraocular Movements: Extraocular movements intact.     Pupils: Pupils are equal, round, and reactive to light.  Cardiovascular:     Rate and Rhythm: Normal rate and regular rhythm.     Pulses: Normal pulses.     Heart sounds: Normal heart sounds.  Pulmonary:     Effort: Pulmonary effort is normal. No respiratory distress.     Breath sounds: Normal breath sounds.  Abdominal:     General: Bowel sounds are normal. There is no distension.     Palpations: Abdomen is soft.     Tenderness: There is no abdominal tenderness.  Musculoskeletal:        General: No swelling or deformity.  Skin:    General: Skin is warm and dry.  Neurological:     General: No focal deficit present.     Mental Status: Mental status is at baseline.     Labs on Admission: I have personally reviewed following labs and imaging studies  CBC: No results for input(s): "WBC", "NEUTROABS", "HGB", "HCT", "MCV", "PLT" in the last 168 hours.  Basic Metabolic Panel: Recent Labs  Lab 12/20/22 1210  NA 131*  K 4.3  CL 100  CO2 19*  GLUCOSE 447*  BUN 42*  CREATININE 2.49*  CALCIUM 8.4*    GFR: Estimated Creatinine Clearance: 25.2 mL/min (A) (by C-G formula based on SCr of 2.49 mg/dL (H)).  Liver Function Tests: No results for input(s): "AST", "ALT", "ALKPHOS", "BILITOT", "PROT", "ALBUMIN" in the last 168 hours.  Urine analysis:    Component Value Date/Time   COLORURINE AMBER (A) 12/19/2020 0532   APPEARANCEUR CLOUDY (A) 12/19/2020 0532   LABSPEC 1.017 12/19/2020 0532   PHURINE 5.0 12/19/2020 0532   GLUCOSEU 50 (A) 12/19/2020 0532    HGBUR NEGATIVE 12/19/2020 0532   BILIRUBINUR NEGATIVE 12/19/2020 0532   KETONESUR 5 (A) 12/19/2020 0532   PROTEINUR 30 (A) 12/19/2020 0532   UROBILINOGEN 0.2 02/25/2008 0830   NITRITE NEGATIVE 12/19/2020 0532   LEUKOCYTESUR NEGATIVE 12/19/2020 0532    Radiological Exams on Admission: EP STUDY  Result Date: 12/19/2022 CONCLUSIONS: 1. Successful ablation of cavotricuspid isthmus  dependent (CTI) typical atrial flutter 2. Successful mapping and localization of an atrial tachycardia focus to the high right atrium near RA pacemaker lead insertion. This area was not targeted with ablation given concerns it would render RA lead inoperable. Medical therapy will be pursued. Ablation in this area would only be pursued if patient fails medical therapy after discussions re: possible lead malfunction. 3. Transesophageal echocardiogram showing complete ligation of the left atrial appendage during prior OHS 4. Intracardiac echo reveals trivial pericardial effusion, dilated LA 5. Successful pacemaker interrogation and reprogramming 6. No early apparent complications.    Assessment/Plan Principal Problem:   Atrial fibrillation (HCC) Active Problems:   Coronary artery disease involving native coronary artery of native heart without angina pectoris   Heart block AV complete (HCC)   Diabetes 1.5, managed as type 1 (HCC)   Diabetes 1.5 Hyperglycemia > As per HPI patient noted to have worsening hyperglycemia while on sliding scale insulin and off insulin pump.  Due to this change she has not been receiving basal insulin has not yet comfortable switching back to insulin pump due to concern that we will be able to keep up. > Also received a dose of Decadron for prevention of postoperative nausea and vomiting. > Diabetic educator was consulted and has recommended initial basal dose of 20 units of long-acting insulin which is near her typical daily basal total dose of 26.4 units. >  beta-hydroxybutyric acid and BMP  have been ordered to hopefully rule out any impending/early diabetic emergency. > Primary team is hopeful for discharge tomorrow. - Continue to hold Ozempic, can resume at discharge - Continue with basal dose of 28 units - Continue sliding scale insulin - Recheck CBG at approximately 2:30 PM - Additional orders as indicated ADDENDUM > CBGs improving at 2:30pm with mildly elevated beta hydroxybutyric acid and normal anion gap.  Appears interventions are working and prevented hyperglycemic emergency. > Agree with message received from diabetic coordinator to and transition patient to every 4 hour SSI. - SSI every 4 hours - Recheck CBG at 6 PM. - Plan to resume insulin pump in the morning.  Complete heart block Atrial fibrillation Hypertension CAD status post CABG > As per HPI patient present for consideration of closure of left atrial appendage in the setting of patient with recurrent issues with A-fib and anticoagulation who has previously undergone maze procedure. > Patient currently being loaded with amiodarone IV  - Per primary team  TRH will follow throughout this evening and plan to sign off this evening if improvement in blood sugar. Please call us again when needed.  Thank you very much for involving [place] in the care of your patient.  Author: Marcelyn Bruins, MD 12/20/2022 1:33 PM  For on call review [email or web address].

## 2022-12-20 NOTE — Inpatient Diabetes Management (Signed)
Inpatient Diabetes Program Recommendations  AACE/ADA: New Consensus Statement on Inpatient Glycemic Control (2015)  Target Ranges:  Prepandial:   less than 140 mg/dL      Peak postprandial:   less than 180 mg/dL (1-2 hours)      Critically ill patients:  140 - 180 mg/dL   Lab Results  Component Value Date   GLUCAP 483 (H) 12/20/2022   HGBA1C 7.5 (H) 01/02/2021    Review of Glycemic Control  Latest Reference Range & Units 12/19/22 20:05 12/19/22 22:15 12/20/22 06:56 12/20/22 07:47  Glucose-Capillary 70 - 99 mg/dL 168 (H) 222 (H) 436 (H) 483 (H)  (H): Data is abnormally high Diabetes history: Type 1.5 DM Outpatient Diabetes medications: Mini Medtronic- Humalog, Ozempic 0.325 mg qwk Current orders for Inpatient glycemic control: Novolog 0-15 units TID & HS Decadron 5 mg x 1  Inpatient Diabetes Program Recommendations:    Noted significant hyperglycemia, discontinued insulin pump and demonstration of Decadron on 2/26.  Consider: -BMET and beta hydroxybutyric acid now -Adding Semglee 28 units QD  Pending results of BMET/beta,  may need to consider IV insulin for DKA.    Spoke with patient regarding outpatient diabetes management. Patient is followed by Dr Chalmers Cater, outpatient endocrinology.  Per patient, has been well controlled.  Patient states that she was diagnosed with diabetes following gestational delivery. Patient uses a Medtronic insulin pump with Humalog insulin as an outpatient.  Patient has insulin pump in the bed with her but it is not connected nor does it have an insulin reservoir in it at this time. She has additional supplies needed for reapplication when appropriate.   Outpatient insulin pump settings are as follows:  Basal insulin  0000-0000 1.10 units/hour Total daily basal insulin: 26.4 units/24 hours  Carb Coverage 0000-1100 1:11 1 unit for every 11 grams of carbohydrates 1101-1600 1:10 1 unit for every 10 grams of carbohydrates 1601-0000 1:11 1 unit for  every 11 grams of carbohydrates  Insulin Sensitivity 0000-1300 1:45 1 unit drops blood glucose 45 mg/dl 1301-2100 1:40 1 unit drops blood glucose 40 mg/dl 2101-0000 1:45 1 unit drops blood glucose 45 mg/dl  Target Glucose Goals 0000-0000 100-150 mg/dl  Patient agreeable to using SQ insulin and states that he is suppose to go home tomorrow. Explained Lantus duration and advised patient that if he is discharged and resumes his insulin pump, he will not want to resume his basal insulin until 24 hours after the Lantus is given today since it will still be active. Patient verbalized understanding of information discussed and states that he does not have any further questions related to diabetes at this time.  NURSING: Once insulin pump order set is ordered please print off the Patient insulin pump contract and flow sheet. The insulin pump contract should be signed by the patient and then placed in the chart. The patient insulin pump flow sheet will be completed by the patient at the bedside and the RN caring for the patient will use the patient's flow sheet to document in the [facility]. RN will need to complete the Nursing Insulin Pump Flowsheet at least once a shift. Patient will need to keep extra insulin pump supplies at the bedside at all times.   Discussed plan of care with Marlon Pel, PA.    Thanks, Bronson Curb, MSN, RNC-OB Diabetes Coordinator [phone] (8a-5p)

## 2022-12-21 ENCOUNTER — Inpatient Hospital Stay (HOSPITAL_COMMUNITY): Payer: 59

## 2022-12-21 DIAGNOSIS — N1831 Chronic kidney disease, stage 3a: Secondary | ICD-10-CM

## 2022-12-21 DIAGNOSIS — I4719 Other supraventricular tachycardia: Secondary | ICD-10-CM

## 2022-12-21 DIAGNOSIS — N1832 Chronic kidney disease, stage 3b: Secondary | ICD-10-CM | POA: Insufficient documentation

## 2022-12-21 DIAGNOSIS — I4891 Unspecified atrial fibrillation: Secondary | ICD-10-CM | POA: Diagnosis not present

## 2022-12-21 DIAGNOSIS — E669 Obesity, unspecified: Secondary | ICD-10-CM

## 2022-12-21 DIAGNOSIS — N179 Acute kidney failure, unspecified: Secondary | ICD-10-CM

## 2022-12-21 DIAGNOSIS — E139 Other specified diabetes mellitus without complications: Secondary | ICD-10-CM | POA: Diagnosis not present

## 2022-12-21 LAB — BASIC METABOLIC PANEL
Anion gap: 8 (ref 5–15)
BUN: 48 mg/dL — ABNORMAL HIGH (ref 6–20)
CO2: 22 mmol/L (ref 22–32)
Calcium: 8.3 mg/dL — ABNORMAL LOW (ref 8.9–10.3)
Chloride: 103 mmol/L (ref 98–111)
Creatinine, Ser: 2.81 mg/dL — ABNORMAL HIGH (ref 0.44–1.00)
GFR, Estimated: 19 mL/min — ABNORMAL LOW (ref >60)
Glucose, Bld: 124 mg/dL — ABNORMAL HIGH (ref 70–99)
Potassium: 4.4 mmol/L (ref 3.5–5.1)
Sodium: 133 mmol/L — ABNORMAL LOW (ref 135–145)

## 2022-12-21 LAB — GLUCOSE, CAPILLARY
Glucose-Capillary: 113 mg/dL — ABNORMAL HIGH (ref 70–99)
Glucose-Capillary: 127 mg/dL — ABNORMAL HIGH (ref 70–99)
Glucose-Capillary: 208 mg/dL — ABNORMAL HIGH (ref 70–99)
Glucose-Capillary: 223 mg/dL — ABNORMAL HIGH (ref 70–99)
Glucose-Capillary: 228 mg/dL — ABNORMAL HIGH (ref 70–99)
Glucose-Capillary: 247 mg/dL — ABNORMAL HIGH (ref 70–99)

## 2022-12-21 LAB — HEMOGLOBIN A1C
Hgb A1c MFr Bld: 7.4 % — ABNORMAL HIGH (ref 4.8–5.6)
Mean Plasma Glucose: 166 mg/dL

## 2022-12-21 MED ORDER — INSULIN ASPART 100 UNIT/ML IJ SOLN
4.0000 [IU] | Freq: Three times a day (TID) | INTRAMUSCULAR | Status: DC
  Administered 2022-12-21 (×2): 4 [IU] via SUBCUTANEOUS

## 2022-12-21 MED ORDER — AMIODARONE HCL 200 MG PO TABS
400.0000 mg | ORAL_TABLET | Freq: Two times a day (BID) | ORAL | Status: DC
  Administered 2022-12-21 – 2022-12-22 (×3): 400 mg via ORAL
  Filled 2022-12-21 (×3): qty 2

## 2022-12-21 MED ORDER — INSULIN PUMP
Freq: Three times a day (TID) | SUBCUTANEOUS | Status: DC
  Filled 2022-12-21: qty 1

## 2022-12-21 NOTE — Inpatient Diabetes Management (Addendum)
Inpatient Diabetes Program Recommendations  AACE/ADA: New Consensus Statement on Inpatient Glycemic Control (2015)  Target Ranges:  Prepandial:   less than 140 mg/dL      Peak postprandial:   less than 180 mg/dL (1-2 hours)      Critically ill patients:  140 - 180 mg/dL    Latest Reference Range & Units 12/20/22 06:56 12/20/22 07:47 12/20/22 12:13 12/20/22 14:28 12/20/22 16:30 12/20/22 21:02  Glucose-Capillary 70 - 99 mg/dL 436 (H) 483 (H)  15 units Novolog  438 (H)  15 units Novolog  28 units Semglee @1317   391 (H) 314 (H)  11 units Novolog  219 (H)  5 units Novolog   (H): Data is abnormally high  Latest Reference Range & Units 12/21/22 00:30 12/21/22 04:28 12/21/22 08:27  Glucose-Capillary 70 - 99 mg/dL 127 (H)  2 units Novolog  113 (H) 247 (H)  5 units Novolog  28 units Semglee @0834    (H): Data is abnormally high  Diabetes history: Type 1.5 DM  Outpatient DM Meds: Mini Medtronic- Humalog            Ozempic 0.375 mg qwk  Current orders: Novolog 0-15 units Q4 hours     Semglee 28 units Daily   Met w/ pt at bedside this AM.  Pt A&O and able to tell me how she uses her insulin pump at home.  Has Medtronic 780g and usually is in [employer] mode where the pump runs automatically with little intervention from the pt.  We discussed how she received 28 units Semglee insulin this AM and that I would like to have her wait 24 hours until she resumes her pump to give the Semglee insulin time to leave her system (Semglee will last about 24 hours).  Do not want the Semglee and the basal rates on her pump to overlap.  Pt OK with this plan.  Note Semglee has been discontinued so it will not be given tomorrow AM (03/29).  We will continue to give Novolog SSI and I plan to ask the MD to please order Novolog meal coverage that can be given through tomorrow AM.  Once home pump resumed tomorrow AM, Novolog SQ Insulin orders can be d/c'd and pt can manage CBGs with her insulin  pump.   Addendum 12:30pm--Spoke w/ PA Micheal Tillery.  Pt to resume home insulin pump tomorrow AM 03/29.  Orders placed to resume home insulin pump tomorrow AM.  Also secured order to Start Novolog 4 units TID with meals for Meal Coverage--Alerted RN via Happy and also called Pt to let her know as well.    ENDO: Dr. Chalmers Cater Insulin Pump Settings: Basal insulin  0000-0000       1.10 units/hour Total daily basal insulin: 26.4 units/24 hours   Carb Coverage 0000-1100       1:11     1 unit for every 11 grams of carbohydrates 1101-1600       1:10     1 unit for every 10 grams of carbohydrates 1601-0000       1:11     1 unit for every 11 grams of carbohydrates   Insulin Sensitivity 0000-1300       1:45     1 unit drops blood glucose 45 mg/dl 1301-2100       1:40     1 unit drops blood glucose 40 mg/dl 2101-0000       1:45     1 unit drops blood glucose 45 mg/dl  Target Glucose Goals 0000-0000       100-150 mg/dl    --Will follow patient during hospitalization--  Wyn Quaker RN, MSN, [facility] Diabetes Coordinator Inpatient Glycemic Control Team Team Pager: [phone] (8a-5p)

## 2022-12-21 NOTE — Progress Notes (Signed)
  Progress Note   Date: 12/21/2022  Patient Name: Colleen [name]        MRN#: [id number]  Clarification of diagnosis:  Chronic diastolic CHF

## 2022-12-21 NOTE — Progress Notes (Signed)
PROGRESS NOTE  Colleen [name] Mccamy [id number] DOB: 07/10/1966   PCP: Orpah Melter, MD  Patient is from: Home  DOA: 12/19/2022 LOS: 1  Chief complaints No chief complaint on file.    Brief Narrative / Interim history: 57 year old F with PMH of CAD/CABG, IDDM on insulin pump, A-fib s/p maze and LAA appendage ligation, CHB/PPM, asthma, HTN, GERD, CKD-3B and venous insufficiency admitted by cardiology for symptomatic atrial fibrillation/flutter for which she had ablation and comprehensive EP study on 3/26.  Hospitalist service consulted for glycemic control.  She also had AKI likely prerenal from hypotension.   Subjective: Seen and examined earlier this morning.  No major events overnight of this morning.  CBG improved.  She is on SSI every 4 hours and Semglee 28 units daily.  Waiting on diabetic coronary diet to know where she can restart pump.  No other complaints.  Objective: Vitals:   12/20/22 1632 12/20/22 2018 12/21/22 0028 12/21/22 0424  BP: (!) 144/85 (!) 110/55 (!) 107/52 119/70  Pulse: (!) 105 73 79 77  Resp:  17 16 17   Temp: 97.8 F (36.6 C) 99 F (37.2 C) 98.9 F (37.2 C) 98.4 F (36.9 C)  TempSrc: Oral Oral Oral Oral  SpO2: 93% 97% 95% 96%  Weight:      Height:        Examination:  GENERAL: No apparent distress.  Nontoxic. HEENT: MMM.  Vision and hearing grossly intact.  NECK: Supple.  No apparent JVD.  RESP:  No IWOB.  Fair aeration bilaterally. CVS:  RRR. Heart sounds normal.  ABD/GI/GU: BS+. Abd soft, NTND.  MSK/EXT:  Moves extremities. No apparent deformity. No edema.  SKIN: no apparent skin lesion or wound NEURO: Awake, alert and oriented appropriately.  No apparent focal neuro deficit. PSYCH: Calm. Normal affect.   Procedures:  3/26-TEE, ablation and comprehensive EP study  Microbiology summarized: None  Assessment and plan: Principal Problem:   Atrial fibrillation ([place]) Active Problems:   GERD (gastroesophageal reflux disease)   Coronary  artery disease involving native coronary artery of native heart without angina pectoris   Heart block AV complete ([place])   AKI (acute kidney injury) ([place])   Diabetes 1.5, managed as type 1 ([place])   Chronic kidney disease, stage 3b ([place])  Uncontrolled IDDM with hyperglycemia: Type 1.5 diabetes mellitus type 1 but also on Ozempic.  A1c 7.4%.  On insulin pump at home.  She uses 26.4 units / 24 hours basal with carb coverage.  Goal CBG 100-150 Recent Labs  Lab 12/20/22 2102 12/21/22 0030 12/21/22 0428 12/21/22 0827 12/21/22 1158  GLUCAP 219* 127* 113* 247* 228*  -Received semaglutide 28 units this morning.  Plan to restart home regimen tomorrow. -Continue SSI every 4 hours today -Appreciate help by diabetic coordinator.  AKI on CKD-3B: Baseline Cr 1.3-1.5.  I suspect this is prerenal from hypotension.  She also received a dose of Entresto.  Does not look like she has contrast exposure. Recent Labs    03/23/22 1001 04/07/22 0903 04/20/22 1509 05/04/22 0823 12/05/22 1028 12/20/22 1210 12/21/22 0152  BUN 54* 29* 42* 34* 20 42* 48*  CREATININE 1.66* 1.29* 1.49* 1.46* 1.34* 2.49* 2.81*  -Check renal ultrasound -Strict intake and output -Avoid hypotension -Allow oral hydration.  Atrial fibrillation/flutter/SVT: Primary reason for admission. -S/p TEE, ablation and comprehensive EP study. -Transition to p.o. amiodarone loading dose -Continue home Eliquis -Cardiology managing.   Hypertension: Normotensive. -Avoid hypotension  History of CAD s/p CABG: Stable.  Obesity Body mass index is  32.59 kg/m.          DVT prophylaxis:   apixaban (ELIQUIS) tablet 5 mg  Code Status: Full code Family Communication: None at bedside Level of care: Progressive Status is: Inpatient    Final disposition: Home Consultants:  TRH  35 minutes with more than 50% spent in reviewing records, counseling patient/family and coordinating care.   Sch Meds:  Scheduled Meds:  amiodarone  400  mg Oral BID   apixaban  5 mg Oral BID   insulin aspart  0-15 Units Subcutaneous Q4H   rosuvastatin  10 mg Oral QHS   sodium chloride flush  3 mL Intravenous Q12H   Continuous Infusions:  sodium chloride     PRN Meds:.sodium chloride, acetaminophen, meclizine, ondansetron (ZOFRAN) IV, prochlorperazine, sodium chloride flush  Antimicrobials: Anti-infectives (From admission, onward)    Start     Dose/Rate Route Frequency Ordered Stop   12/19/22 1318  ceFAZolin (ANCEF) 2-4 GM/100ML-% IVPB       Note to Pharmacy: Alba Destine B: cabinet override      12/19/22 1318 12/20/22 0129        I have personally reviewed the following labs and images: CBC: No results for input(s): "WBC", "NEUTROABS", "HGB", "HCT", "MCV", "PLT" in the last 168 hours. BMP &GFR Recent Labs  Lab 12/20/22 1210 12/21/22 0152  NA 131* 133*  K 4.3 4.4  CL 100 103  CO2 19* 22  GLUCOSE 447* 124*  BUN 42* 48*  CREATININE 2.49* 2.81*  CALCIUM 8.4* 8.3*   Estimated Creatinine Clearance: 22.3 mL/min (A) (by C-G formula based on SCr of 2.81 mg/dL (H)). Liver & Pancreas: No results for input(s): "AST", "ALT", "ALKPHOS", "BILITOT", "PROT", "ALBUMIN" in the last 168 hours. No results for input(s): "LIPASE", "AMYLASE" in the last 168 hours. No results for input(s): "AMMONIA" in the last 168 hours. Diabetic: Recent Labs    12/19/22 1836  HGBA1C 7.4*   Recent Labs  Lab 12/20/22 2102 12/21/22 0030 12/21/22 0428 12/21/22 0827 12/21/22 1158  GLUCAP 219* 127* 113* 247* 228*   Cardiac Enzymes: No results for input(s): "CKTOTAL", "CKMB", "CKMBINDEX", "TROPONINI" in the last 168 hours. No results for input(s): "PROBNP" in the last 8760 hours. Coagulation Profile: No results for input(s): "INR", "PROTIME" in the last 168 hours. Thyroid Function Tests: No results for input(s): "TSH", "T4TOTAL", "FREET4", "T3FREE", "THYROIDAB" in the last 72 hours. Lipid Profile: No results for input(s): "CHOL", "HDL",  "LDLCALC", "TRIG", "CHOLHDL", "LDLDIRECT" in the last 72 hours. Anemia Panel: No results for input(s): "VITAMINB12", "FOLATE", "FERRITIN", "TIBC", "IRON", "RETICCTPCT" in the last 72 hours. Urine analysis:    Component Value Date/Time   COLORURINE AMBER (A) 12/19/2020 0532   APPEARANCEUR CLOUDY (A) 12/19/2020 0532   LABSPEC 1.017 12/19/2020 0532   PHURINE 5.0 12/19/2020 0532   GLUCOSEU 50 (A) 12/19/2020 0532   HGBUR NEGATIVE 12/19/2020 0532   BILIRUBINUR NEGATIVE 12/19/2020 0532   KETONESUR 5 (A) 12/19/2020 0532   PROTEINUR 30 (A) 12/19/2020 0532   UROBILINOGEN 0.2 02/25/2008 0830   NITRITE NEGATIVE 12/19/2020 0532   LEUKOCYTESUR NEGATIVE 12/19/2020 0532   Sepsis Labs: Invalid input(s): "PROCALCITONIN", "LACTICIDVEN"  Microbiology: No results found for this or any previous visit (from the past 240 hour(s)).  Radiology Studies: No results found.    Jennie Bolar T. [place]  If 7PM-7AM, please contact night-coverage [email or web address] 12/21/2022, 12:13 PM

## 2022-12-21 NOTE — Progress Notes (Signed)
Patient Name: Colleen [name] Date of Encounter: 12/21/2022  Primary Cardiologist: None Electrophysiologist: Vickie Epley, MD  Interval Summary   Feeling better this am.   Inpatient Medications    Scheduled Meds:  apixaban  5 mg Oral BID   insulin aspart  0-15 Units Subcutaneous Q4H   insulin glargine-yfgn  28 Units Subcutaneous Daily   rosuvastatin  10 mg Oral QHS   sodium chloride flush  3 mL Intravenous Q12H   Continuous Infusions:  sodium chloride     amiodarone 30 mg/hr (12/21/22 0833)   PRN Meds: sodium chloride, acetaminophen, meclizine, ondansetron (ZOFRAN) IV, prochlorperazine, sodium chloride flush   Vital Signs    Vitals:   12/20/22 1632 12/20/22 2018 12/21/22 0028 12/21/22 0424  BP: (!) 144/85 (!) 110/55 (!) 107/52 119/70  Pulse: (!) 105 73 79 77  Resp:  17 16 17   Temp: 97.8 F (36.6 C) 99 F (37.2 C) 98.9 F (37.2 C) 98.4 F (36.9 C)  TempSrc: Oral Oral Oral Oral  SpO2: 93% 97% 95% 96%  Weight:      Height:        Intake/Output Summary (Last 24 hours) at 12/21/2022 0944 Last data filed at 12/20/2022 2118 Gross per 24 hour  Intake 386.02 ml  Output --  Net 386.02 ml   Filed Weights   12/19/22 1213  Weight: 83.5 kg    Physical Exam    GEN- The patient is well appearing, alert and oriented x 3 today.   Lungs- Clear to ausculation bilaterally, normal work of breathing Cardiac- Regular rate and rhythm, no murmurs, rubs or gallops GI- soft, NT, ND, + BS Extremities- no clubbing or cyanosis. No edema  Telemetry    NSR with intermittent atrial pacing (personally reviewed)  Hospital Course       Patient Name: Colleen [name] Date of Encounter: 12/20/2022   Primary Cardiologist: None Electrophysiologist: Vickie Epley, MD   Interval Summary    Nauseated this am. BG 436   Inpatient Medications    Scheduled Meds:  apixaban  5 mg Oral BID   insulin aspart  0-15 Units Subcutaneous TID WC   insulin aspart  0-5 Units  Subcutaneous QHS   rosuvastatin  10 mg Oral QHS   sodium chloride flush  3 mL Intravenous Q12H    Continuous Infusions:  sodium chloride     amiodarone 60 mg/hr (12/20/22 0652)    Followed by   amiodarone      PRN Meds: sodium chloride, acetaminophen, meclizine, ondansetron (ZOFRAN) IV, sodium chloride flush    Vital Signs          Vitals:    12/19/22 2100 12/20/22 0017 12/20/22 0438 12/20/22 0519  BP: 128/79 123/65 (!) 90/48 (!) 89/49  Pulse: 84 83   88  Resp: 14 15 16 16   Temp:   97.9 F (36.6 C)      TempSrc:   Oral      SpO2: 100%   99% 99%  Weight:          Height:              Intake/Output Summary (Last 24 hours) at 12/20/2022 0726 Last data filed at 12/19/2022 1418    Gross per 24 hour  Intake 50 ml  Output --  Net 50 ml       Filed Weights    12/19/22 1213  Weight: 83.5 kg      Physical Exam    GEN- The patient  is well appearing, alert and oriented x 3 today.   Lungs- Clear to ausculation bilaterally, normal work of breathing Cardiac- Regular rate and rhythm, no murmurs, rubs or gallops GI- soft, NT, ND, + BS Extremities- no clubbing or cyanosis. No edema   Telemetry    Appears to show 2 separate rhythms, one narrow, more likely a junctional escape, and another wide QRS felt to potentially be a conducted AFL. Rates 80-90s (personally reviewed)   [place] is a 57 y.o. female with CAD s/p CABG, PAF, h/o CHB s/p [place] PPM admitted for planned AF ablation.    Kept for observation with possible flutter, also developed hyperglycemia in setting of Type 1 DM and insulin pump being held for procedure.   Assessment & Plan    Persistent atrial fibrillation Eliquis continued.  Stable post ablation.    ?Junctional rhythm ?Atrial flutter Now in NSR with intermittent a pacing on amiodarone Transition to po amiodarone at 400 mg BID.    Type 1 DM On Insulin pump at home.  Appreciate IM and DM coordinator assistance.  She  received Semglee this am, so would like re-apply her pump tomorrow (3/29) am first thing per DM coordinator.    For questions or updates, please contact [place] Please consult [email or web address] for contact info under Cardiology/STEMI.  Signed, [name], PA-C  12/21/2022, 9:44 AM

## 2022-12-22 ENCOUNTER — Other Ambulatory Visit (HOSPITAL_COMMUNITY): Payer: Self-pay

## 2022-12-22 ENCOUNTER — Other Ambulatory Visit: Payer: Self-pay

## 2022-12-22 ENCOUNTER — Other Ambulatory Visit: Payer: Self-pay | Admitting: Student

## 2022-12-22 DIAGNOSIS — N1831 Chronic kidney disease, stage 3a: Secondary | ICD-10-CM

## 2022-12-22 DIAGNOSIS — N179 Acute kidney failure, unspecified: Secondary | ICD-10-CM

## 2022-12-22 DIAGNOSIS — I480 Paroxysmal atrial fibrillation: Secondary | ICD-10-CM

## 2022-12-22 DIAGNOSIS — E139 Other specified diabetes mellitus without complications: Secondary | ICD-10-CM | POA: Diagnosis not present

## 2022-12-22 DIAGNOSIS — I4719 Other supraventricular tachycardia: Secondary | ICD-10-CM | POA: Diagnosis not present

## 2022-12-22 DIAGNOSIS — I4891 Unspecified atrial fibrillation: Secondary | ICD-10-CM | POA: Diagnosis not present

## 2022-12-22 LAB — BASIC METABOLIC PANEL
Anion gap: 8 (ref 5–15)
BUN: 51 mg/dL — ABNORMAL HIGH (ref 6–20)
CO2: 23 mmol/L (ref 22–32)
Calcium: 8.4 mg/dL — ABNORMAL LOW (ref 8.9–10.3)
Chloride: 99 mmol/L (ref 98–111)
Creatinine, Ser: 2.27 mg/dL — ABNORMAL HIGH (ref 0.44–1.00)
GFR, Estimated: 24 mL/min — ABNORMAL LOW (ref >60)
Glucose, Bld: 139 mg/dL — ABNORMAL HIGH (ref 70–99)
Potassium: 4 mmol/L (ref 3.5–5.1)
Sodium: 130 mmol/L — ABNORMAL LOW (ref 135–145)

## 2022-12-22 LAB — MAGNESIUM: Magnesium: 2 mg/dL (ref 1.7–2.4)

## 2022-12-22 LAB — CBC
HCT: 28 % — ABNORMAL LOW (ref 36.0–46.0)
Hemoglobin: 9.2 g/dL — ABNORMAL LOW (ref 12.0–15.0)
MCH: 29.8 pg (ref 26.0–34.0)
MCHC: 32.9 g/dL (ref 30.0–36.0)
MCV: 90.6 fL (ref 80.0–100.0)
Platelets: 109 10*3/uL — ABNORMAL LOW (ref 150–400)
RBC: 3.09 MIL/uL — ABNORMAL LOW (ref 3.87–5.11)
RDW: 12.7 % (ref 11.5–15.5)
WBC: 3.9 10*3/uL — ABNORMAL LOW (ref 4.0–10.5)
nRBC: 0 % (ref 0.0–0.2)

## 2022-12-22 LAB — GLUCOSE, CAPILLARY
Glucose-Capillary: 105 mg/dL — ABNORMAL HIGH (ref 70–99)
Glucose-Capillary: 110 mg/dL — ABNORMAL HIGH (ref 70–99)
Glucose-Capillary: 171 mg/dL — ABNORMAL HIGH (ref 70–99)

## 2022-12-22 MED ORDER — AMIODARONE HCL 200 MG PO TABS
ORAL_TABLET | ORAL | 0 refills | Status: DC
  Filled 2022-12-22: qty 50, 30d supply, fill #0

## 2022-12-22 NOTE — Final Progress Note (Signed)
PROGRESS NOTE  Colleen [name] [id number] DOB: 1966-06-02   PCP: Orpah Melter, MD  Patient is from: Home  DOA: 12/19/2022 LOS: 2  Chief complaints No chief complaint on file.    Brief Narrative / Interim history: 57 year old F with PMH of CAD/CABG, IDDM on insulin pump, A-fib s/p maze and LAA appendage ligation, CHB/PPM, asthma, HTN, GERD, CKD-3B and venous insufficiency admitted by cardiology for symptomatic atrial fibrillation/flutter for which she had ablation and comprehensive EP study on 3/26.  Hospitalist service consulted for hyperglycemia and AKI.  Hyperglycemia improved with adjustment of insulin.  AKI likely due to hypotension and improved.  Renal ultrasound without significant finding.  Entresto discontinued on discharge.  Patient has been advised to hold torsemide until follow-up with PCP/cardiology   Subjective: Seen and examined earlier this morning.   Feels "great".  Excited to go home.  Objective: Vitals:   12/22/22 0800 12/22/22 0805 12/22/22 0819 12/22/22 0900  BP:  (!) 140/74    Pulse: 80 82 81 80  Resp: (!) 24 17 20 11   Temp:  98 F (36.7 C)    TempSrc:  Oral    SpO2: 100% 100% 100% 100%  Weight:      Height:        Examination:  GENERAL: No apparent distress.  Nontoxic. HEENT: MMM.  Vision and hearing grossly intact.  NECK: Supple.  No apparent JVD.  RESP:  No IWOB.  Fair aeration bilaterally. CVS:  RRR. Heart sounds normal.  ABD/GI/GU: BS+. Abd soft, NTND.  MSK/EXT:  Moves extremities. No apparent deformity. No edema.  SKIN: no apparent skin lesion or wound NEURO: Awake, alert and oriented appropriately.  No apparent focal neuro deficit. PSYCH: Calm. Normal affect.   Procedures:  3/26-TEE, ablation and comprehensive EP study  Microbiology summarized: None  Assessment and plan: Principal Problem:   Atrial fibrillation ([place]) Active Problems:   GERD (gastroesophageal reflux disease)   Coronary artery disease involving native  coronary artery of native heart without angina pectoris   Heart block AV complete ([place])   AKI (acute kidney injury) ([place])   Diabetes 1.5, managed as type 1 ([place])   Chronic kidney disease, stage 3b ([place])   Atrial tachycardia   Stage 3a chronic kidney disease ([place])  Uncontrolled IDDM with hyperglycemia: Type 1.5 diabetes mellitus type 1 but also on Ozempic.  A1c 7.4%.  On insulin pump at home.  She uses 26.4 units / 24 hours basal with carb coverage.  Goal CBG 100-150 Recent Labs  Lab 12/21/22 1625 12/21/22 2055 12/22/22 0043 12/22/22 0448 12/22/22 0727  GLUCAP 208* 223* 171* 105* 110*  -Patient to start her insulin pump at home.  AKI on CKD-3B: Baseline Cr 1.3-1.5.  I suspect this is prerenal from hypotension.  She also received a dose of Entresto.  Does not look like she has contrast exposure.  Renal US without significant finding.  Improved. Recent Labs    03/23/22 1001 04/07/22 0903 04/20/22 1509 05/04/22 0823 12/05/22 1028 12/20/22 1210 12/21/22 0152 12/22/22 0231  BUN 54* 29* 42* 34* 20 42* 48* 51*  CREATININE 1.66* 1.29* 1.49* 1.46* 1.34* 2.49* 2.81* 2.27*  -Recheck renal panel at follow-up -Discontinue Entresto.  Advised patient to hold torsemide.  Atrial fibrillation/flutter/SVT: Primary reason for admission. -S/p TEE, ablation and comprehensive EP study. -Amiodarone and Eliquis per cardiology   Hypertension: Normotensive.  History of CAD s/p CABG: Stable.  Obesity Body mass index is 32.59 kg/m.          DVT  prophylaxis:    Code Status: Full code Family Communication: None at bedside Level of care: Progressive Status is: Inpatient    Final disposition: Discharged home by primary team this morning Consultants:  TRH  35 minutes with more than 50% spent in reviewing records, counseling patient/family and coordinating care.   Sch Meds:  Scheduled Meds:   Continuous Infusions:   PRN Meds:.  Antimicrobials: Anti-infectives (From  admission, onward)    Start     Dose/Rate Route Frequency Ordered Stop   12/19/22 1318  ceFAZolin (ANCEF) 2-4 GM/100ML-% IVPB       Note to Pharmacy: Alba Destine B: cabinet override      12/19/22 1318 12/20/22 0129        I have personally reviewed the following labs and images: CBC: Recent Labs  Lab 12/22/22 0231  WBC 3.9*  HGB 9.2*  HCT 28.0*  MCV 90.6  PLT 109*   BMP &GFR Recent Labs  Lab 12/20/22 1210 12/21/22 0152 12/22/22 0231  NA 131* 133* 130*  K 4.3 4.4 4.0  CL 100 103 99  CO2 19* 22 23  GLUCOSE 447* 124* 139*  BUN 42* 48* 51*  CREATININE 2.49* 2.81* 2.27*  CALCIUM 8.4* 8.3* 8.4*  MG  --   --  2.0   Estimated Creatinine Clearance: 27.6 mL/min (A) (by C-G formula based on SCr of 2.27 mg/dL (H)). Liver & Pancreas: No results for input(s): "AST", "ALT", "ALKPHOS", "BILITOT", "PROT", "ALBUMIN" in the last 168 hours. No results for input(s): "LIPASE", "AMYLASE" in the last 168 hours. No results for input(s): "AMMONIA" in the last 168 hours. Diabetic: No results for input(s): "HGBA1C" in the last 72 hours.  Recent Labs  Lab 12/21/22 1625 12/21/22 2055 12/22/22 0043 12/22/22 0448 12/22/22 0727  GLUCAP 208* 223* 171* 105* 110*   Cardiac Enzymes: No results for input(s): "CKTOTAL", "CKMB", "CKMBINDEX", "TROPONINI" in the last 168 hours. No results for input(s): "PROBNP" in the last 8760 hours. Coagulation Profile: No results for input(s): "INR", "PROTIME" in the last 168 hours. Thyroid Function Tests: No results for input(s): "TSH", "T4TOTAL", "FREET4", "T3FREE", "THYROIDAB" in the last 72 hours. Lipid Profile: No results for input(s): "CHOL", "HDL", "LDLCALC", "TRIG", "CHOLHDL", "LDLDIRECT" in the last 72 hours. Anemia Panel: No results for input(s): "VITAMINB12", "FOLATE", "FERRITIN", "TIBC", "IRON", "RETICCTPCT" in the last 72 hours. Urine analysis:    Component Value Date/Time   COLORURINE AMBER (A) 12/19/2020 0532   APPEARANCEUR CLOUDY  (A) 12/19/2020 0532   LABSPEC 1.017 12/19/2020 0532   PHURINE 5.0 12/19/2020 0532   GLUCOSEU 50 (A) 12/19/2020 0532   HGBUR NEGATIVE 12/19/2020 0532   BILIRUBINUR NEGATIVE 12/19/2020 0532   KETONESUR 5 (A) 12/19/2020 0532   PROTEINUR 30 (A) 12/19/2020 0532   UROBILINOGEN 0.2 02/25/2008 0830   NITRITE NEGATIVE 12/19/2020 0532   LEUKOCYTESUR NEGATIVE 12/19/2020 0532   Sepsis Labs: Invalid input(s): "PROCALCITONIN", "LACTICIDVEN"  Microbiology: No results found for this or any previous visit (from the past 240 hour(s)).  Radiology Studies: No results found.    Antaniya Venuti T. [place]  If 7PM-7AM, please contact night-coverage [email or web address] 12/22/2022, 8:15 PM

## 2022-12-22 NOTE — Discharge Summary (Signed)
ELECTROPHYSIOLOGY PROCEDURE DISCHARGE SUMMARY    Patient ID: Colleen [name],  MRN: [id number], DOB/AGE: 02/20/1966 57 y.o.  Admit date: 12/19/2022 Discharge date: 12/22/2022  Primary Care Physician: Orpah Melter, MD  Primary Cardiologist: None  Electrophysiologist: Dr. Quentin Ore   Primary Discharge Diagnosis:  Atrial Fibrillation Atrial flutter  Secondary Discharge Diagnosis:  Type 1 Diabetes AKI  Procedures This Admission:  1.  Electrophysiology study and radiofrequency catheter ablation of Atrial flutter/fibrillation on 3/26 by Dr. Quentin Ore .  This study demonstrated: 1. Successful ablation of cavotricuspid isthmus dependent (CTI) typical atrial flutter 2. Successful mapping and localization of an atrial tachycardia focus to the high right atrium near RA pacemaker lead insertion. This area was not targeted with ablation given concerns it would render RA lead inoperable. Medical therapy will be pursued. Ablation in this area would only be pursued if patient fails medical therapy after discussions re: possible lead malfunction.  3. Transesophageal echocardiogram showing complete ligation of the left atrial appendage during prior OHS 4. Intracardiac echo reveals trivial pericardial effusion, dilated LA 5. Successful pacemaker interrogation and reprogramming 6. No early apparent complications.     Brief HPI: Colleen [name] is a 57 y.o. female with a history of Atrial Flutter.  They have failed therapy post MAZE, and was felt to be borderline tikosyn candidate given marginal renal function. Risks, benefits, and alternatives to catheter ablation of Atrial Fibrillation were reviewed with the patient who wished to proceed.   The patient has been on uninterrupted anticoagulation for more than 3 weeks and did not require TEE.  Hospital Course:  The patient was admitted and underwent EPS/RFCA of Atrial Fibrillation with details as outlined above -> with a flutter being the primary  arrhyhtmia.  They were monitored on telemetry overnight which demonstrated possible atypical atrial flutter and junctional rhythm with intermittent pacing. Given this, she was started on IV amiodarone.  Groin was without complication on the day of discharge.    Course was additionally complicated by the need for her insulin pump to be held for the procedure.  POD#1 she had hyperglycemia requiring assistance from DM coordinator and IM team given her history of T1DM. This improved and she is schedule to resume her insulin pump am of 3/29, pt plans to do as soon as she gets home. She Is very well versed in her care and reliable so this was felt to be appropriate.   The patient was examined and considered to be stable for discharge.  Wound care and restrictions were reviewed with the patient.  The patient will be seen back by [facility] in 4 weeks and Dr. Quentin Ore in 12 weeks for post ablation follow up.   CHA2DS2VASC is at least 5.   With AKI pt will hold Entresto until labs drawn next week. [place] will arrange to have these drawn at a [place] there and faxed to our office.   Amio load and taper 400 mg BID x 5 days 400 mg daily x 5 days 200 mg daily -> chronic for now.    Physical Exam: Vitals:   12/21/22 1303 12/21/22 1627 12/21/22 1949 12/22/22 0445  BP: (!) 110/54 129/67 (!) 102/55 116/70  Pulse: 68 65 67 74  Resp: 15 16 12 16   Temp:  97.8 F (36.6 C) 97.8 F (36.6 C) 97.8 F (36.6 C)  TempSrc:  Oral Oral Oral  SpO2: 98% 100% 100% 97%  Weight:      Height:  GEN- NAD. A&O x 3.  HEENT: Normocephalic, atraumatic Lungs- CTAB, Normal effort.  Heart- RRR, No M/G/R.  GI- Soft, NT, ND.  Extremities- No clubbing, cyanosis, or edema;  Skin- warm and dry, no rash or lesion, left chest without hematoma/ecchymosis  Discharge Medications:  Allergies as of 12/22/2022       Reactions   Gadolinium Derivatives     Kidney disease   Iodine Other (See Comments)    Iohexol Other (See Comments)   Nsaids    Other Reaction(s): Kidney disease   Other Other (See Comments)   Contrast Dye   Succinylcholine Other (See Comments)   Pseudocholinesterase deficiency        Medication List     STOP taking these medications    Entresto 24-26 MG Generic drug: sacubitril-valsartan       TAKE these medications    albuterol 108 (90 Base) MCG/ACT inhaler Commonly known as: VENTOLIN HFA Inhale 1-2 puffs into the lungs every 6 (six) hours as needed for wheezing or shortness of breath (Asthma).   amiodarone 200 MG tablet Commonly known as: PACERONE Take 2 tablets (400 mg total) by mouth 2 (two) times daily for 5 days, THEN 2 tablets (400 mg total) daily for 5 days, THEN 1 tablet (200 mg total) daily. Start taking on: December 22, 2022   apixaban 5 MG Tabs tablet Commonly known as: ELIQUIS Take 1 tablet (5 mg total) by mouth 2 (two) times daily.   B-12 2500 MCG Subl Place 2,500 mcg under the tongue daily.   famotidine 20 MG tablet Commonly known as: Pepcid Take 1 tablet (20 mg total) by mouth 2 (two) times daily. What changed:  when to take this reasons to take this   folic acid [id number] MCG tablet Commonly known as: FOLVITE Take 800 mcg by mouth daily.   insulin lispro 100 UNIT/ML injection Commonly known as: HUMALOG Inject into the skin See admin instructions. Insulin Pump - Mini Medtronic 670G   loratadine 10 MG tablet Commonly known as: CLARITIN Take 10 mg by mouth daily as needed for allergies.   metoprolol succinate 25 MG 24 hr tablet Commonly known as: TOPROL-XL Take 25 mg by mouth daily. Take with or immediately following a meal.   nitroGLYCERIN 0.4 MG SL tablet Commonly known as: NITROSTAT Place 1 tablet (0.4 mg total) under the tongue every 5 (five) minutes x 3 doses as needed for chest pain.   Ozempic (0.25 or 0.5 MG/DOSE) 2 MG/3ML Sopn Generic drug: Semaglutide(0.25 or 0.5MG /DOS) Inject 0.375 mg into the skin every Monday.    rosuvastatin 10 MG tablet Commonly known as: CRESTOR TAKE 1 TABLET BY MOUTH AT  BEDTIME   tobramycin 0.3 % ophthalmic solution Commonly known as: TOBREX Place 2 drops into both eyes as needed (prior to eye injections every 4 months).   torsemide 20 MG tablet Commonly known as: DEMADEX TAKE 1 TABLET BY MOUTH  TWICE DAILY AS NEEDED FOR  LEG SWELLING   Vitamin D 50 MCG (2000 UT) tablet Take 4,000 Units by mouth daily.        Disposition:    Follow-up Information     [facility] Follow up.   Specialty: Cardiology Why: on 4/19 at 0930 for post ablation follow up Contact information: [address] [id number] [place] at [facility] Follow up.   Specialty: Cardiology Why: on 4/3 at 245  for bmet. OK to come anytime between 830 and 1200 and 130 and 4. Contact information: [address] [place] [id number] [place] [address] [phone]                Duration of Discharge Encounter: Greater than 30 minutes including physician time.  Jacalyn Lefevre, PA-C  12/22/2022 7:57 AM

## 2022-12-22 NOTE — Discharge Instructions (Signed)

## 2022-12-27 ENCOUNTER — Other Ambulatory Visit: Payer: 59

## 2022-12-29 ENCOUNTER — Telehealth: Payer: Self-pay | Admitting: Student

## 2022-12-29 LAB — BASIC METABOLIC PANEL
BUN/Creatinine Ratio: 14 (ref 9–23)
BUN: 24 mg/dL (ref 6–24)
CO2: 19 mmol/L — ABNORMAL LOW (ref 20–29)
Calcium: 8.7 mg/dL (ref 8.7–10.2)
Chloride: 101 mmol/L (ref 96–106)
Creatinine, Ser: 1.73 mg/dL — ABNORMAL HIGH (ref 0.57–1.00)
Glucose: 205 mg/dL — ABNORMAL HIGH (ref 70–99)
Potassium: 4.5 mmol/L (ref 3.5–5.2)
Sodium: 136 mmol/L (ref 134–144)
eGFR: 34 mL/min/{1.73_m2} — ABNORMAL LOW (ref >59)

## 2022-12-29 LAB — ECHO TEE
Height: 63 in
Weight: 2944 oz

## 2022-12-29 MED ORDER — SACUBITRIL-VALSARTAN 24-26 MG PO TABS: 1.0000 | ORAL_TABLET | Freq: Two times a day (BID) | ORAL | Status: DC

## 2022-12-29 NOTE — Telephone Encounter (Signed)
Spoke with patient and discussed recommendation from Otilio Saber, PA-C based on recent labs:  Lets have her restart Entresto at 24/26 mg BID on Monday 4/8, and will recheck BMET at AF clinic 4/19   Patient states she has plenty of Entresto, no refills needed at this time.  Medication list updated.  Patient verbalized understanding of the above, expressed appreciation for call.

## 2023-01-05 ENCOUNTER — Ambulatory Visit (INDEPENDENT_AMBULATORY_CARE_PROVIDER_SITE_OTHER): Payer: 59

## 2023-01-05 DIAGNOSIS — I442 Atrioventricular block, complete: Secondary | ICD-10-CM | POA: Diagnosis not present

## 2023-01-05 LAB — CUP PACEART REMOTE DEVICE CHECK
Battery Remaining Longevity: 53 mo
Battery Remaining Percentage: 81 %
Battery Voltage: 2.98 V
Brady Statistic AP VP Percent: 65 %
Brady Statistic AP VS Percent: 1 %
Brady Statistic AS VP Percent: 34 %
Brady Statistic AS VS Percent: 1 %
Brady Statistic RA Percent Paced: 65 %
Brady Statistic RV Percent Paced: 99 %
Date Time Interrogation Session: 20240412020019
Implantable Lead Connection Status: 753985
Implantable Lead Connection Status: 753985
Implantable Lead Implant Date: 20220414
Implantable Lead Implant Date: 20220414
Implantable Lead Location: 753859
Implantable Lead Location: 753860
Implantable Lead Model: 1944
Implantable Pulse Generator Implant Date: 20220414
Lead Channel Impedance Value: 440 Ohm
Lead Channel Impedance Value: 510 Ohm
Lead Channel Pacing Threshold Amplitude: 1 V
Lead Channel Pacing Threshold Amplitude: 1.5 V
Lead Channel Pacing Threshold Pulse Width: 0.5 ms
Lead Channel Pacing Threshold Pulse Width: 0.8 ms
Lead Channel Sensing Intrinsic Amplitude: 1.1 mV
Lead Channel Sensing Intrinsic Amplitude: 12 mV
Lead Channel Setting Pacing Amplitude: 2.5 V
Lead Channel Setting Pacing Amplitude: 3 V
Lead Channel Setting Pacing Pulse Width: 0.5 ms
Lead Channel Setting Sensing Sensitivity: 2 mV
Pulse Gen Model: 2272
Pulse Gen Serial Number: 3920245

## 2023-01-09 ENCOUNTER — Encounter: Payer: Self-pay | Admitting: Cardiology

## 2023-01-12 ENCOUNTER — Ambulatory Visit (HOSPITAL_COMMUNITY)
Admission: RE | Admit: 2023-01-12 | Discharge: 2023-01-12 | Disposition: A | Discharge status: Home / Self Care | Payer: 59 | Source: Ambulatory Visit | Attending: Physician Assistant | Admitting: Physician Assistant

## 2023-01-12 VITALS — BP 166/76 | HR 61 | Ht 63.0 in | Wt 200.2 lb

## 2023-01-12 DIAGNOSIS — D6869 Other thrombophilia: Secondary | ICD-10-CM

## 2023-01-12 DIAGNOSIS — I483 Typical atrial flutter: Secondary | ICD-10-CM | POA: Diagnosis not present

## 2023-01-12 DIAGNOSIS — I2510 Atherosclerotic heart disease of native coronary artery without angina pectoris: Secondary | ICD-10-CM | POA: Diagnosis not present

## 2023-01-12 DIAGNOSIS — Z951 Presence of aortocoronary bypass graft: Secondary | ICD-10-CM | POA: Insufficient documentation

## 2023-01-12 DIAGNOSIS — Z794 Long term (current) use of insulin: Secondary | ICD-10-CM | POA: Diagnosis not present

## 2023-01-12 DIAGNOSIS — Z5181 Encounter for therapeutic drug level monitoring: Secondary | ICD-10-CM

## 2023-01-12 DIAGNOSIS — Z7901 Long term (current) use of anticoagulants: Secondary | ICD-10-CM | POA: Diagnosis not present

## 2023-01-12 DIAGNOSIS — I110 Hypertensive heart disease with heart failure: Secondary | ICD-10-CM | POA: Insufficient documentation

## 2023-01-12 DIAGNOSIS — I50812 Chronic right heart failure: Secondary | ICD-10-CM | POA: Diagnosis not present

## 2023-01-12 DIAGNOSIS — Z79899 Other long term (current) drug therapy: Secondary | ICD-10-CM | POA: Diagnosis not present

## 2023-01-12 DIAGNOSIS — I081 Rheumatic disorders of both mitral and tricuspid valves: Secondary | ICD-10-CM | POA: Insufficient documentation

## 2023-01-12 DIAGNOSIS — E119 Type 2 diabetes mellitus without complications: Secondary | ICD-10-CM | POA: Insufficient documentation

## 2023-01-12 DIAGNOSIS — I4892 Unspecified atrial flutter: Secondary | ICD-10-CM | POA: Diagnosis not present

## 2023-01-12 DIAGNOSIS — I4819 Other persistent atrial fibrillation: Secondary | ICD-10-CM

## 2023-01-12 LAB — BASIC METABOLIC PANEL
Anion gap: 7 (ref 5–15)
BUN: 33 mg/dL — ABNORMAL HIGH (ref 6–20)
CO2: 25 mmol/L (ref 22–32)
Calcium: 8.7 mg/dL — ABNORMAL LOW (ref 8.9–10.3)
Chloride: 102 mmol/L (ref 98–111)
Creatinine, Ser: 1.58 mg/dL — ABNORMAL HIGH (ref 0.44–1.00)
GFR, Estimated: 38 mL/min — ABNORMAL LOW (ref >60)
Glucose, Bld: 185 mg/dL — ABNORMAL HIGH (ref 70–99)
Potassium: 5.2 mmol/L — ABNORMAL HIGH (ref 3.5–5.1)
Sodium: 134 mmol/L — ABNORMAL LOW (ref 135–145)

## 2023-01-12 MED ORDER — AMIODARONE HCL 200 MG PO TABS: ORAL_TABLET | ORAL | 0 refills | Status: DC

## 2023-01-12 NOTE — Progress Notes (Signed)
Primary Care Physician: Joycelyn Rua, MD Primary Cardiologist: Dr Rosemary Holms Primary Electrophysiologist: Dr Lalla Brothers Referring Physician: Dr Mackie Pai Colleen [name] is a 57 y.o. female with a history of HTN, DM, CAD s/p CABG 2023, CHB s/p PPM, atrial flutter, atrial fibrillation who presents for follow up in the [facility]. Patient is s/p MAZE and LAA clipping at the time of her CABG. She was seen by Dr Lalla Brothers for evaluation and underwent atrial flutter ablation on 12/19/22. She had persistent atrial tachycardia and atrial flutter post procedure and was started on amiodarone. Course was additionally complicated by the need for her insulin pump to be held for the procedure. POD#1 she had hyperglycemia requiring assistance from DM coordinator and IM team given her history of T1DM. Patient is on Eliquis for a CHADS2VASC score of 4.  On follow up today, patient reports that she is very slowly improving since discharge. She is not back to her baseline level of activity. Denies chest pain, swallowing pain, or groin issues. She is eager to get back on her Ozempic which had been held inpatient due to AKI. She remains in SR.   Today, she denies symptoms of palpitations, chest pain, shortness of breath, orthopnea, PND, lower extremity edema, dizziness, presyncope, syncope, snoring, daytime somnolence, bleeding, or neurologic sequela. The patient is tolerating medications without difficulties and is otherwise without complaint today.    Atrial Fibrillation Risk Factors:  she does not have symptoms or diagnosis of sleep apnea. she does not have a history of rheumatic fever.   she has a BMI of Body mass index is 35.46 kg/m.Marland Kitchen Filed Weights   01/12/23 0922  Weight: 90.8 kg    Family History  Problem Relation Age of Onset   Hypertension Father    Colon cancer Father 75   Hypertension Paternal Aunt    Hypertension Paternal Grandmother    Hypertension Paternal  Grandfather      Atrial Fibrillation Management history:  Previous antiarrhythmic drugs: amiodarone  Previous cardioversions: none Previous ablations: 12/19/22 Anticoagulation history: Eliquis   Past Medical History:  Diagnosis Date   Acute on chronic right heart failure (HCC) 01/27/2021   Acute respiratory failure with hypoxemia (HCC)    AKI (acute kidney injury) (HCC)    Asthma    Chest pain    Complication of anesthesia    Psuedocholinerasterase deficiency per pt   Diabetes mellitus    TYPE II   Diabetic macular edema (HCC)    Pt gets injections in eyes with [facility]   Paroxysmal atrial flutter (HCC) 12/28/2020   Seasonal allergies    Past Surgical History:  Procedure Laterality Date   ATRIAL FIBRILLATION ABLATION N/A 12/19/2022   Procedure: ATRIAL FIBRILLATION ABLATION;  Surgeon: Lanier Prude, MD;  Location: [facility];  Service: Cardiovascular;  Laterality: N/A;   BACK SURGERY  2009   CATARACT SURGERY     BILATERAL   CESAREAN SECTION     X2   CORONARY ARTERY BYPASS GRAFT  12/05/2021   KNEE SURGERY Right    LEFT HEART CATH AND CORONARY ANGIOGRAPHY N/A 05/08/2017   Procedure: Left Heart Cath and Coronary Angiography;  Surgeon: Elder Negus, MD;  Location: [facility];  Service: Cardiovascular;  Laterality: N/A;   ORIF ANKLE FRACTURE Left 03/23/2016   Procedure: OPEN REDUCTION INTERNAL FIXATION (ORIF) ANKLE FRACTURE MALLEOLUS  MALUNION WITH TIBIAL AND FIBULAR OSTEOTOMY AND AUTOGRAFT;  Surgeon: Toni Arthurs, MD;  Location: [facility];  Service: Orthopedics;  Laterality: Left;   PACEMAKER IMPLANT N/A 01/06/2021   Procedure: PACEMAKER IMPLANT;  Surgeon: Lanier Prude, MD;  Location: [facility] CV [facility];  Service: Cardiovascular;  Laterality: N/A;   RIGHT/LEFT HEART CATH AND CORONARY ANGIOGRAPHY N/A 12/24/2020   Procedure: RIGHT/LEFT HEART CATH AND CORONARY ANGIOGRAPHY;  Surgeon: Elder Negus, MD;  Location: [facility];  Service: Cardiovascular;  Laterality: N/A;   SHOULDER ARTHROSCOPY W/ ROTATOR CUFF REPAIR Left    TEE WITHOUT CARDIOVERSION N/A 12/19/2022   Procedure: TRANSESOPHAGEAL ECHOCARDIOGRAM;  Surgeon: Lanier Prude, MD;  Location: [facility] INVASIVE CV LAB;  Service: Cardiovascular;  Laterality: N/A;   TEMPORARY PACEMAKER N/A 12/19/2020   Procedure: TEMPORARY PACEMAKER;  Surgeon: Elder Negus, MD;  Location: [facility];  Service: Cardiovascular;  Laterality: N/A;   TEMPORARY PACEMAKER N/A 12/19/2020   Procedure: TEMPORARY PACEMAKER;  Surgeon: Elder Negus, MD;  Location: [facility];  Service: Cardiovascular;  Laterality: N/A;   TEMPORARY PACEMAKER N/A 01/02/2021   Procedure: TEMPORARY PACEMAKER;  Surgeon: [place], Peter M, MD;  Location: [facility] INVASIVE CV LAB;  Service: Cardiovascular;  Laterality: N/A;    Current Outpatient Medications  Medication Sig Dispense Refill   albuterol (PROVENTIL HFA;VENTOLIN HFA) 108 (90 Base) MCG/ACT inhaler Inhale 1-2 puffs into the lungs every 6 (six) hours as needed for wheezing or shortness of breath (Asthma).     amiodarone (PACERONE) 200 MG tablet Take 2 tablets (400 mg total) by mouth 2 (two) times daily for 5 days, THEN 2 tablets (400 mg total) daily for 5 days, THEN 1 tablet (200 mg total) daily. (Patient taking differently: Taking one tablet by mouth once daily) 90 tablet 0   apixaban (ELIQUIS) 5 MG TABS tablet Take 1 tablet (5 mg total) by mouth 2 (two) times daily. 180 tablet 3   Cholecalciferol (VITAMIN D) 2000 units tablet Take 4,000 Units by mouth daily.     Cyanocobalamin (B-12) 2500 MCG SUBL Place 2,500 mcg under the tongue daily.     cyclobenzaprine (FLEXERIL) 5 MG tablet Take 5 mg by mouth at bedtime as needed.     famotidine (PEPCID) 20 MG tablet Take 1 tablet (20 mg total) by mouth 2 (two) times daily. (Patient taking differently: Take 20 mg by mouth daily as needed for heartburn or indigestion.) 90 tablet 3   folic acid  (FOLVITE) 800 MCG tablet Take 800 mcg by mouth daily.     insulin lispro (HUMALOG) 100 UNIT/ML injection Inject into the skin See admin instructions. Insulin Pump - Mini Medtronic 670G     loratadine (CLARITIN) 10 MG tablet Take 10 mg by mouth daily as needed for allergies.     metoprolol succinate (TOPROL-XL) 25 MG 24 hr tablet Take 25 mg by mouth daily. Take with or immediately following a meal.     nitroGLYCERIN (NITROSTAT) 0.4 MG SL tablet Place 1 tablet (0.4 mg total) under the tongue every 5 (five) minutes x 3 doses as needed for chest pain. 25 tablet 1   rosuvastatin (CRESTOR) 10 MG tablet TAKE 1 TABLET BY MOUTH AT  BEDTIME 90 tablet 3   sacubitril-valsartan (ENTRESTO) 24-26 MG Take 1 tablet by mouth 2 (two) times daily.     tobramycin (TOBREX) 0.3 % ophthalmic solution Place 2 drops into both eyes as needed (prior to eye injections every 4 months).     torsemide (DEMADEX) 20 MG tablet TAKE 1 TABLET BY MOUTH  TWICE DAILY AS NEEDED FOR  LEG SWELLING 180 tablet 3   amoxicillin-clavulanate (AUGMENTIN) 875-125 MG tablet SMARTSIG:1 Tablet(s) By Mouth Every 12 Hours     OZEMPIC, 0.25 OR 0.5 MG/DOSE, 2 MG/3ML SOPN Inject 0.375 mg into the skin every Monday. (Patient not taking: Reported on 01/12/2023)     No current facility-administered medications for this encounter.    Allergies  Allergen Reactions   Gadolinium Derivatives      Kidney disease   Iodine Other (See Comments)   Iohexol Other (See Comments)   Nsaids     Other Reaction(s): Kidney disease   Other Other (See Comments)    Contrast Dye   Succinylcholine Other (See Comments)    Pseudocholinesterase deficiency    Social History   Socioeconomic History   Marital status: Married    Spouse name: Not on file   Number of children: 2   Years of education: Not on file   Highest education level: Not on file  Occupational History   Not on file  Tobacco Use   Smoking status: Former    Packs/day: 0.25    Years: 2.00     Additional pack years: 0.00    Total pack years: 0.50    Types: Cigarettes    Quit date: 01/17/1987    Years since quitting: 36.0   Smokeless tobacco: Never  Vaping Use   Vaping Use: Never used  Substance and Sexual Activity   Alcohol use: Yes    Comment: occ   Drug use: No   Sexual activity: Yes    Comment: PATIENT'S PARTNER WITH VASECTOMY  Other Topics Concern   Not on file  Social History Narrative   Not on file   Social Determinants of Health   Financial Resource Strain: Not on file  Food Insecurity: Not on file  Transportation Needs: Not on file  Physical Activity: Not on file  Stress: Not on file  Social Connections: Not on file  Intimate Partner Violence: Not on file     ROS- All systems are reviewed and negative except as per the HPI above.  Physical Exam: Vitals:   01/12/23 0922  BP: (!) 166/76  Pulse: 61  Weight: 90.8 kg  Height:  (1.6 m)    GEN- The patient is a well appearing female, alert and oriented x 3 today.   Head- normocephalic, atraumatic Eyes-  Sclera clear, conjunctiva pink Ears- hearing intact Oropharynx- clear Neck- supple  Lungs- Clear to ausculation bilaterally, normal work of breathing Heart- Regular rate and rhythm, no murmurs, rubs or gallops  GI- soft, NT, ND, + BS Extremities- no clubbing, cyanosis, 1+ edema RLE, LLE chronically edematous 2/2 prior surgeries.  MS- no significant deformity or atrophy Skin- no rash or lesion Psych- euthymic mood, full affect Neuro- strength and sensation are intact  Wt Readings from Last 3 Encounters:  01/12/23 90.8 kg  12/19/22 83.5 kg  10/23/22 87.5 kg    EKG today demonstrates  AV dual paced rhythm Vent. rate 61 BPM PR interval 178 ms QRS duration 170 ms QT/QTcB 516/519 ms  Echo 04/05/22 demonstrated Left ventricle cavity is normal in size. Mild concentric hypertrophy ofthe left ventricle. Abnormal septal wall motion due to post-operativecoronary artery bypass graft. Mild global  hypokinesis. LVEF 50-55%. Doppler evidence of grade II (pseudonormal) diastolic dysfunction, elevated LAP.  Right ventricle cavity not well visualized, but at least mildly dilated with at least mildly reduced systolic function. Device lead is seen.  Left atrial cavity is mildly dilated. Right atrial cavity is mild  to moderately dilated.  Mild (Grade I) mitral regurgitation. Moderate tricuspid regurgitation. Estimated pulmonary artery systolic pressure at least 26 mmHg. IVC is dilated with a respiratory response of <50%. Estimated right atrial pressure at least 15 mmHg. Compared to previous study on 02/25/2021, LVEF has improved from 35-40%.      Epic records are reviewed at length today.  CHA2DS2-VASc Score = 4  The patient's score is based upon: CHF History: 0 HTN History: 1 Diabetes History: 1 Stroke History: 0 Vascular Disease History: 1 Age Score: 0 Gender Score: 1       ASSESSMENT AND PLAN: 1. Persistent Atrial Fibrillation/atrial flutter The patient's CHA2DS2-VASc score is 4, indicating a 4.8% annual risk of stroke.   S/p MAZE/LAA clip 11/2021 S/p CTI ablation 12/19/22 Patient appears to be maintaining SR.  Continue amiodarone 200 mg daily Continue Eliquis 5 mg BID with no missed doses for 3 months post ablation. ? If she will be able to discontinue Eliquis at that time with h/o LAA clipping. She is eager to stop anticoagulation.   Continue Toprol 25 mg daily  2. Secondary Hypercoagulable State (ICD10:  D68.69) The patient is at significant risk for stroke/thromboembolism based upon her CHA2DS2-VASc Score of 4.  Continue Apixaban (Eliquis).   3. Obesity Body mass index is 35.46 kg/m. Lifestyle modification was discussed at length including regular exercise and weight reduction.  4. CAD S/p CABG/MAZE/LAA clip 11/2021 No anginal symptoms. Followed by Dr Rosemary Holms  5. HTN Elevated today, better controlled at home, no changes today.  6. Systolic dysfunction EF  on TEE 40-45% GDMT per primary cardiology team Check bmet today She does report more lower extremity edema than usual and her rings fit a little tighter. She has been more sparing with her torsemide with her recent AKI. If Cr close to baseline, she will take some PRN torsemide.    Follow up with Dr Rosemary Holms and Dr Lalla Brothers as scheduled.    Jorja Loa PA-C [facility] [address] [place], [place] [address] [phone] 01/12/2023 9:52 AM

## 2023-01-13 ENCOUNTER — Encounter: Payer: Self-pay | Admitting: Cardiology

## 2023-01-15 ENCOUNTER — Other Ambulatory Visit: Payer: Self-pay

## 2023-01-15 DIAGNOSIS — I480 Paroxysmal atrial fibrillation: Secondary | ICD-10-CM

## 2023-01-15 MED ORDER — METOPROLOL SUCCINATE ER 25 MG PO TB24: 25.0000 mg | ORAL_TABLET | Freq: Every day | ORAL | 3 refills | Status: AC

## 2023-01-16 ENCOUNTER — Ambulatory Visit (HOSPITAL_COMMUNITY): Payer: 59 | Admitting: Physician Assistant

## 2023-02-06 ENCOUNTER — Ambulatory Visit: Payer: 59 | Admitting: Internal Medicine

## 2023-02-06 ENCOUNTER — Encounter: Payer: Self-pay | Admitting: Internal Medicine

## 2023-02-06 VITALS — BP 135/78 | HR 67 | Resp 16 | Ht 63.0 in | Wt 195.0 lb

## 2023-02-06 DIAGNOSIS — N1831 Chronic kidney disease, stage 3a: Secondary | ICD-10-CM

## 2023-02-06 DIAGNOSIS — I2510 Atherosclerotic heart disease of native coronary artery without angina pectoris: Secondary | ICD-10-CM

## 2023-02-06 DIAGNOSIS — I442 Atrioventricular block, complete: Secondary | ICD-10-CM

## 2023-02-06 DIAGNOSIS — I480 Paroxysmal atrial fibrillation: Secondary | ICD-10-CM

## 2023-02-06 DIAGNOSIS — E1065 Type 1 diabetes mellitus with hyperglycemia: Secondary | ICD-10-CM

## 2023-02-06 NOTE — Progress Notes (Unsigned)
Follow up visit  Subjective:   Colleen [name], female    DOB: 01-Jun-1966, 57 y.o.   MRN: [id number]   HPI    No chief complaint on file.   57 y.o. Caucasian female  with hypertension, type 1 diabetes mellitus, CAD with RCA and Lcx CTOs, late presentation inferior MI (11/2020), complicated by complete AV block and AKI/CKD, RV failure, resolution of AV block and AKI on discharge, followed by repeat admission with asystole cardiac arrest (12/2020), now s/p PPM, s/p CABGX3 (LIMA-LAD, SVG-RCA, SVG-OM), LAA clip, Maze at [facility] (11/2021), complicated by sternal incision infection, subsequent AKI while on Entresto.  Patient is doing well. She denies chest pain, shortness of breath, palpitations, leg edema, orthopnea, PND, TIA/syncope.She is now on Ozempic as a part of clinical trial, and is doing well. Blood pressure elevated today, but generally well controlled.     Current Outpatient Medications:    albuterol (PROVENTIL HFA;VENTOLIN HFA) 108 (90 Base) MCG/ACT inhaler, Inhale 1-2 puffs into the lungs every 6 (six) hours as needed for wheezing or shortness of breath (Asthma)., Disp: , Rfl:    amiodarone (PACERONE) 200 MG tablet, Taking one tablet by mouth once daily, Disp: 90 tablet, Rfl: 0   amoxicillin-clavulanate (AUGMENTIN) 875-125 MG tablet, SMARTSIG:1 Tablet(s) By Mouth Every 12 Hours, Disp: , Rfl:    apixaban (ELIQUIS) 5 MG TABS tablet, Take 1 tablet (5 mg total) by mouth 2 (two) times daily., Disp: 180 tablet, Rfl: 3   Cholecalciferol (VITAMIN D) 2000 units tablet, Take 4,000 Units by mouth daily., Disp: , Rfl:    Cyanocobalamin (B-12) 2500 MCG SUBL, Place 2,500 mcg under the tongue daily., Disp: , Rfl:    cyclobenzaprine (FLEXERIL) 5 MG tablet, Take 5 mg by mouth at bedtime as needed., Disp: , Rfl:    famotidine (PEPCID) 20 MG tablet, Take 1 tablet (20 mg total) by mouth 2 (two) times daily. (Patient taking differently: Take 20 mg by mouth daily as needed for heartburn or indigestion.),  Disp: 90 tablet, Rfl: 3   folic acid (FOLVITE) 800 MCG tablet, Take 800 mcg by mouth daily., Disp: , Rfl:    insulin lispro (HUMALOG) 100 UNIT/ML injection, Inject into the skin See admin instructions. Insulin Pump - Mini Medtronic 670G, Disp: , Rfl:    loratadine (CLARITIN) 10 MG tablet, Take 10 mg by mouth daily as needed for allergies., Disp: , Rfl:    metoprolol succinate (TOPROL-XL) 25 MG 24 hr tablet, Take 1 tablet (25 mg total) by mouth daily. Take with or immediately following a meal., Disp: 90 tablet, Rfl: 3   nitroGLYCERIN (NITROSTAT) 0.4 MG SL tablet, Place 1 tablet (0.4 mg total) under the tongue every 5 (five) minutes x 3 doses as needed for chest pain., Disp: 25 tablet, Rfl: 1   OZEMPIC, 0.25 OR 0.5 MG/DOSE, 2 MG/3ML SOPN, Inject 0.375 mg into the skin every Monday. (Patient not taking: Reported on 01/12/2023), Disp: , Rfl:    rosuvastatin (CRESTOR) 10 MG tablet, TAKE 1 TABLET BY MOUTH AT  BEDTIME, Disp: 90 tablet, Rfl: 3   sacubitril-valsartan (ENTRESTO) 24-26 MG, Take 1 tablet by mouth 2 (two) times daily., Disp: , Rfl:    tobramycin (TOBREX) 0.3 % ophthalmic solution, Place 2 drops into both eyes as needed (prior to eye injections every 4 months)., Disp: , Rfl:    torsemide (DEMADEX) 20 MG tablet, TAKE 1 TABLET BY MOUTH  TWICE DAILY AS NEEDED FOR  LEG SWELLING, Disp: 180 tablet, Rfl: 3  Cardiovascular &  other pertient studies:  EKG 08/11/2022: Sinus rhythm 74 bpm Ventricular paced rhythm  Echocardiogram 04/05/2022:  Left ventricle cavity is normal in size. Mild concentric hypertrophy of  the left ventricle. Abnormal septal wall motion due to post-operative  coronary artery bypass graft. Mild global hypokinesis. LVEF 50-55%.  Doppler evidence of grade II (pseudonormal) diastolic dysfunction,  elevated LAP.  Right ventricle cavity not well visualized, but at least mildly dilated  with at least mildly reduced systolic function.  Device lead is seen.  Left atrial cavity is  mildly dilated.  Right atrial cavity is mild to moderately dilated.  Mild (Grade I) mitral regurgitation.  Moderate tricuspid regurgitation. Estimated pulmonary artery systolic  pressure at least 26 mmHg.  IVC is dilated with a respiratory response of <50%. Estimated right atrial  pressure at least 15 mmHg.  Compared to previous study on 02/25/2021, LVEF has improved from 35-40%.   CABG note 11/2021 ([facility], Dr. Mayford Knife): CABG x 3 (LIMA-LAD, SVG-RCA, SVG-OM2), LAA Clip, MAZE =  [facility] echocardiogram 10/2021: MILD LV DYSFUNCTION (See above) WITH MILD LVH  NORMAL RIGHT VENTRICULAR SYSTOLIC FUNCTION  VALVULAR REGURGITATION: MILD MR, TRIVIAL PR, MILD TR  NO VALVULAR STENOSIS  NO PRIOR STUDY FOR COMPARISON   Recent labs: 05/04/2022: Glucose 159, BUN/Cr 34/1.46. EGFR 41. Na/K 140/4.3.  BNP 302 H/H 9/28. MCV 94. Platelets 199   Review of Systems  Constitutional: Positive for malaise/fatigue.  Cardiovascular:  Positive for dyspnea on exertion and leg swelling. Negative for chest pain, palpitations and syncope.         There were no vitals filed for this visit.   There is no height or weight on file to calculate BMI. There were no vitals filed for this visit.    Objective:   Physical Exam Vitals and nursing note reviewed.  Constitutional:      General: She is not in acute distress. Neck:     Vascular: No JVD.  Cardiovascular:     Rate and Rhythm: Normal rate and regular rhythm.     Heart sounds: Normal heart sounds. No murmur heard. Pulmonary:     Effort: Pulmonary effort is normal.     Breath sounds: Normal breath sounds. No wheezing or rales.  Musculoskeletal:     Right lower leg: Edema (Trace) present.     Left lower leg: Edema (Trace) present.           Assessment & Recommendations:   57 y.o. Caucasian female  with hypertension, type 1 diabetes mellitus, CAD with RCA and Lcx CTOs, late presentation inferior MI (11/2020), complicated by complete AV block and  AKI/CKD, RV failure, resolution of AV block and AKI on discharge, followed by repeat admission with asystole cardiac arrest (12/2020), now s/p PPM, s/p CABGX3 (LIMA-LAD, SVG-RCA, SVG-OM), LAA clip, Maze at [facility] (11/2021), complicated by sternal incision infection, subsequent AI while on Entresto.  CAD, ischemic cardiomyopathy: s/p CABGX3 (LIMA-LAD, SVG-RCA, SVG-OM), LAA clip, Maze at [facility] (11/2021) Significant improvement in symptoms on GDMT. Continue Entresto 24-26 mg bid, metoprolol succinate to 25 mg daily. Continue aspirin, statin.  PAF:  No recent recurrence.  She has  undergone LAA clip and Maze procedure at the time of her CABG (11/2021). Patient is type 1 diabetic and is extremely frustrated with bleeding from insulin lump and having to change her cartridges due to that.  We had a discussion (04/2022) regarding risks and benefits of stopping anticoagulation, including stroke. Patient chose to discontinue anticoagulation and continue Aspirin 81 mg only. Overall, I feel  this is a reasonable decision.  Complete AV block: Normal functioning pacemaker. Has follow up with EP.  CKD stage 3: She has not tolerated Entresto due to AKI. Will monitor renal function closely with introduction of losartan.  Type 1 DM: Continue insulin pump.  Follow-up with Dr. Talmage Nap  F/u in 6 months    Clotilde Dieter, DO Pager: [phone] Office: [phone]

## 2023-02-07 NOTE — Progress Notes (Signed)
Remote pacemaker transmission.   

## 2023-02-09 ENCOUNTER — Ambulatory Visit: Payer: 59 | Admitting: Cardiology

## 2023-02-18 ENCOUNTER — Other Ambulatory Visit: Payer: Self-pay | Admitting: Cardiology

## 2023-02-21 ENCOUNTER — Ambulatory Visit: Payer: 59 | Admitting: Cardiology

## 2023-03-01 NOTE — Progress Notes (Signed)
  Electrophysiology Office Follow up Visit Note:    Date:  03/02/2023   ID:  Colleen [name], DOB 08/06/1966, MRN [id number]  PCP:  Joycelyn Rua, MD  [facility] Cardiologist:  None  [facility] Electrophysiologist:  Lanier Prude, MD    Interval History:    Colleen [name] is a 57 y.o. female who presents for a follow up visit.   The patient had an atrial flutter ablation December 19, 2022.  She also had an atrial tachycardia that localized to the high right atrium near the RA lead insertion site.  I opted to not ablate that at the initial procedure given the concern that it would render the right atrial lead nonfunctional.  She was started on amiodarone during the hospitalization.  Device interrogation since her hospitalization has shown no atrial arrhythmias since her hospitalization..  She saw Clide Cliff in the A-fib clinic on December 12, 2022.  She has been doing well since her procedure without recurrence of arrhythmia.    Past medical, surgical, social and family history were reviewed.  ROS:   Please see the history of present illness.    All other systems reviewed and are negative.  EKGs/Labs/Other Studies Reviewed:    The following studies were reviewed today:  March 02, 2023 in clinic device interrogation personally reviewed Battery longevity 4.5 years Lead parameter stable Turned on ventricular auto capture to maximize battery longevity  EKG:  The ekg ordered today demonstrates atrial paced, ventricular paced rhythm   Physical Exam:    VS:  BP 138/70   Pulse 62   Ht 5\' 3"  (1.6 m)   Wt 196 lb 12.8 oz (89.3 kg)   SpO2 98%   BMI 34.86 kg/m     Wt Readings from Last 3 Encounters:  03/02/23 196 lb 12.8 oz (89.3 kg)  02/06/23 195 lb (88.5 kg)  01/12/23 200 lb 3.2 oz (90.8 kg)     GEN:  Well nourished, well developed in no acute distress CARDIAC: RRR, no murmurs, rubs, gallops.  Pacemaker pocket well-healed RESPIRATORY:  Clear to auscultation without  rales, wheezing or rhonchi       ASSESSMENT:    1. Persistent atrial fibrillation (HCC)   2. Typical atrial flutter (HCC)   3. Atrial tachycardia   4. CHB (complete heart block) (HCC)   5. Cardiac pacemaker in situ   6. Encounter for long-term (current) use of high-risk medication    PLAN:    In order of problems listed above:   #Persistent atrial fibrillation and flutter #Left atrial appendage ligation Has not had a recurrence of A-fib or flutter after her catheter ablation on December 19, 2022.  During the procedure the CTI was ablated.  An atrial tachycardia was not targeted given it was originating immediately beside her right atrial lead insertion site.  Stop Eliquis.  Reduce amiodarone to 100 mg by mouth once daily.  If remains in sinus rhythm for the next 6 months, consider stopping amiodarone at the next appointment.  #High risk med monitoring-amiodarone Update CMP, TSH and free T4 today  #Atrial tachycardia #High risk med monitoring-amiodarone Amiodarone has been effective for her based on her device interrogations.  If fails amiodarone, could consider repeat catheter ablation although this may result in right atrial lead becoming nonfunctional.  #Complete heart block #Pacemaker in situ Device functioning appropriately.  Continue remote monitoring.     Signed, Steffanie Dunn, MD, [facility], [facility] 03/02/2023 2:28 PM    Electrophysiology [facility]

## 2023-03-02 ENCOUNTER — Ambulatory Visit: Payer: 59 | Attending: Cardiology | Admitting: Cardiology

## 2023-03-02 ENCOUNTER — Encounter: Payer: Self-pay | Admitting: Cardiology

## 2023-03-02 VITALS — BP 138/70 | HR 62 | Ht 63.0 in | Wt 196.8 lb

## 2023-03-02 DIAGNOSIS — I4719 Other supraventricular tachycardia: Secondary | ICD-10-CM

## 2023-03-02 DIAGNOSIS — Z950 Presence of cardiac pacemaker: Secondary | ICD-10-CM

## 2023-03-02 DIAGNOSIS — I442 Atrioventricular block, complete: Secondary | ICD-10-CM

## 2023-03-02 DIAGNOSIS — Z79899 Other long term (current) drug therapy: Secondary | ICD-10-CM

## 2023-03-02 DIAGNOSIS — I4819 Other persistent atrial fibrillation: Secondary | ICD-10-CM

## 2023-03-02 DIAGNOSIS — I483 Typical atrial flutter: Secondary | ICD-10-CM

## 2023-03-02 LAB — CUP PACEART INCLINIC DEVICE CHECK
Battery Remaining Longevity: 50 mo
Battery Voltage: 2.98 V
Brady Statistic RA Percent Paced: 86 %
Brady Statistic RV Percent Paced: 99.93 %
Date Time Interrogation Session: 20240607160141
Implantable Lead Connection Status: 753985
Implantable Lead Connection Status: 753985
Implantable Lead Implant Date: 20220414
Implantable Lead Implant Date: 20220414
Implantable Lead Location: 753859
Implantable Lead Location: 753860
Implantable Lead Model: 1944
Implantable Pulse Generator Implant Date: 20220414
Lead Channel Impedance Value: 437.5 Ohm
Lead Channel Impedance Value: 550 Ohm
Lead Channel Pacing Threshold Amplitude: 1.125 V
Lead Channel Pacing Threshold Amplitude: 1.25 V
Lead Channel Pacing Threshold Amplitude: 1.75 V
Lead Channel Pacing Threshold Amplitude: 1.75 V
Lead Channel Pacing Threshold Pulse Width: 0.5 ms
Lead Channel Pacing Threshold Pulse Width: 0.5 ms
Lead Channel Pacing Threshold Pulse Width: 0.8 ms
Lead Channel Pacing Threshold Pulse Width: 0.8 ms
Lead Channel Sensing Intrinsic Amplitude: 1.2 mV
Lead Channel Sensing Intrinsic Amplitude: 12 mV
Lead Channel Setting Pacing Amplitude: 1.375
Lead Channel Setting Pacing Amplitude: 3 V
Lead Channel Setting Pacing Pulse Width: 0.5 ms
Lead Channel Setting Sensing Sensitivity: 2 mV
Pulse Gen Model: 2272
Pulse Gen Serial Number: 3920245

## 2023-03-02 MED ORDER — AMIODARONE HCL 100 MG PO TABS: 100.0000 mg | ORAL_TABLET | Freq: Every day | ORAL | 3 refills | Status: DC

## 2023-03-02 NOTE — Patient Instructions (Signed)
Medication Instructions:  Your physician has recommended you make the following change in your medication:  1) DECREASE amiodarone to 100 mg daily  *If you need a refill on your cardiac medications before your next appointment, please call your pharmacy*   Lab Work: TODAY: BMET, TSH, T4  If you have labs (blood work) drawn today and your tests are completely normal, you will receive your results only by: MyChart Message (if you have MyChart) OR A paper copy in the mail If you have any lab test that is abnormal or we need to change your treatment, we will call you to review the results.  Follow-Up: At [facility], you and your health needs are our priority.  As part of our continuing mission to provide you with exceptional heart care, we have created designated Provider Care Teams.  These Care Teams include your primary Cardiologist (physician) and Advanced Practice Providers (APPs -  Physician Assistants and Nurse Practitioners) who all work together to provide you with the care you need, when you need it.  Your next appointment:   6 month(s)  Provider:   You may see Lanier Prude, MD or one of the following Advanced Practice Providers on your designated Care Team:   Francis Dowse, [place]" Lilly, [place] Sherie Don, NP

## 2023-03-03 LAB — COMPREHENSIVE METABOLIC PANEL
ALT: 14 IU/L (ref 0–32)
AST: 24 IU/L (ref 0–40)
Albumin/Globulin Ratio: 1.5 (ref 1.2–2.2)
Albumin: 3.9 g/dL (ref 3.8–4.9)
Alkaline Phosphatase: 136 IU/L — ABNORMAL HIGH (ref 44–121)
BUN/Creatinine Ratio: 11 (ref 9–23)
BUN: 16 mg/dL (ref 6–24)
Bilirubin Total: 1.1 mg/dL (ref 0.0–1.2)
CO2: 24 mmol/L (ref 20–29)
Calcium: 9.2 mg/dL (ref 8.7–10.2)
Chloride: 104 mmol/L (ref 96–106)
Creatinine, Ser: 1.46 mg/dL — ABNORMAL HIGH (ref 0.57–1.00)
Globulin, Total: 2.6 g/dL (ref 1.5–4.5)
Glucose: 133 mg/dL — ABNORMAL HIGH (ref 70–99)
Potassium: 4.7 mmol/L (ref 3.5–5.2)
Sodium: 140 mmol/L (ref 134–144)
Total Protein: 6.5 g/dL (ref 6.0–8.5)
eGFR: 41 mL/min/{1.73_m2} — ABNORMAL LOW (ref >59)

## 2023-03-03 LAB — T4, FREE: Free T4: 1.57 ng/dL (ref 0.82–1.77)

## 2023-03-03 LAB — TSH: TSH: 3.18 u[IU]/mL (ref 0.450–4.500)

## 2023-03-04 ENCOUNTER — Encounter: Payer: Self-pay | Admitting: Cardiology

## 2023-03-11 ENCOUNTER — Encounter: Payer: Self-pay | Admitting: Cardiology

## 2023-03-12 ENCOUNTER — Other Ambulatory Visit: Payer: Self-pay

## 2023-03-12 MED ORDER — TORSEMIDE 10 MG PO TABS: 10.0000 mg | ORAL_TABLET | Freq: Every day | ORAL | 0 refills | Status: DC

## 2023-03-12 NOTE — Telephone Encounter (Signed)
Okay with me. Please send.  Thanks MJP

## 2023-03-12 NOTE — Telephone Encounter (Signed)
From pt

## 2023-04-06 ENCOUNTER — Ambulatory Visit: Payer: 59 | Attending: Cardiology

## 2023-04-06 DIAGNOSIS — I442 Atrioventricular block, complete: Secondary | ICD-10-CM

## 2023-04-06 LAB — CUP PACEART REMOTE DEVICE CHECK
Battery Remaining Longevity: 53 mo
Battery Remaining Percentage: 76 %
Battery Voltage: 2.98 V
Brady Statistic AP VP Percent: 80 %
Brady Statistic AP VS Percent: 1 %
Brady Statistic AS VP Percent: 20 %
Brady Statistic AS VS Percent: 1 %
Brady Statistic RA Percent Paced: 79 %
Brady Statistic RV Percent Paced: 99 %
Date Time Interrogation Session: 20240712020020
Implantable Lead Connection Status: 753985
Implantable Lead Connection Status: 753985
Implantable Lead Implant Date: 20220414
Implantable Lead Implant Date: 20220414
Implantable Lead Location: 753859
Implantable Lead Location: 753860
Implantable Lead Model: 1944
Implantable Pulse Generator Implant Date: 20220414
Lead Channel Impedance Value: 480 Ohm
Lead Channel Impedance Value: 580 Ohm
Lead Channel Pacing Threshold Amplitude: 1.25 V
Lead Channel Pacing Threshold Amplitude: 1.75 V
Lead Channel Pacing Threshold Pulse Width: 0.5 ms
Lead Channel Pacing Threshold Pulse Width: 0.8 ms
Lead Channel Sensing Intrinsic Amplitude: 1.5 mV
Lead Channel Sensing Intrinsic Amplitude: 12 mV
Lead Channel Setting Pacing Amplitude: 1.5 V
Lead Channel Setting Pacing Amplitude: 3 V
Lead Channel Setting Pacing Pulse Width: 0.5 ms
Lead Channel Setting Sensing Sensitivity: 2 mV
Pulse Gen Model: 2272
Pulse Gen Serial Number: 3920245

## 2023-04-24 NOTE — Progress Notes (Signed)
Remote pacemaker transmission.   

## 2023-05-13 ENCOUNTER — Other Ambulatory Visit: Payer: Self-pay | Admitting: Cardiology

## 2023-07-06 ENCOUNTER — Ambulatory Visit (INDEPENDENT_AMBULATORY_CARE_PROVIDER_SITE_OTHER): Payer: 59

## 2023-07-06 DIAGNOSIS — I442 Atrioventricular block, complete: Secondary | ICD-10-CM

## 2023-07-06 LAB — CUP PACEART REMOTE DEVICE CHECK
Battery Remaining Longevity: 54 mo
Battery Remaining Percentage: 72 %
Battery Voltage: 2.98 V
Brady Statistic AP VP Percent: 72 %
Brady Statistic AP VS Percent: 1 %
Brady Statistic AS VP Percent: 28 %
Brady Statistic AS VS Percent: 1 %
Brady Statistic RA Percent Paced: 71 %
Brady Statistic RV Percent Paced: 99 %
Date Time Interrogation Session: 20241011020014
Implantable Lead Connection Status: 753985
Implantable Lead Connection Status: 753985
Implantable Lead Implant Date: 20220414
Implantable Lead Implant Date: 20220414
Implantable Lead Location: 753859
Implantable Lead Location: 753860
Implantable Lead Model: 1944
Implantable Pulse Generator Implant Date: 20220414
Lead Channel Impedance Value: 510 Ohm
Lead Channel Impedance Value: 580 Ohm
Lead Channel Pacing Threshold Amplitude: 1.25 V
Lead Channel Pacing Threshold Amplitude: 1.75 V
Lead Channel Pacing Threshold Pulse Width: 0.5 ms
Lead Channel Pacing Threshold Pulse Width: 0.8 ms
Lead Channel Sensing Intrinsic Amplitude: 12 mV
Lead Channel Sensing Intrinsic Amplitude: 2.1 mV
Lead Channel Setting Pacing Amplitude: 1.5 V
Lead Channel Setting Pacing Amplitude: 3 V
Lead Channel Setting Pacing Pulse Width: 0.5 ms
Lead Channel Setting Sensing Sensitivity: 2 mV
Pulse Gen Model: 2272
Pulse Gen Serial Number: 3920245

## 2023-07-10 ENCOUNTER — Telehealth: Payer: Self-pay | Admitting: *Deleted

## 2023-07-10 NOTE — Telephone Encounter (Signed)
Primary Cardiologist:Manish Emiliano Dyer, MD   Preoperative team, please contact this patient and set up a phone call appointment for further preoperative risk assessment. Please obtain consent and complete medication review. Thank you for your help.   Request to hold Eliquis, however it appears Eliquis was stopped by Dr. Lalla Brothers 03/02/23.  I also confirmed the patient resides in the state of [place]. As per [facility] Medical Board telemedicine laws, the patient must reside in the state in which the provider is licensed.   Levi Aland, NP-C  07/10/2023, 4:30 PM [address] Office [phone] Fax [phone]

## 2023-07-10 NOTE — Telephone Encounter (Signed)
Pt has been scheduled a tele pre op appt 08/06/23. Med rec and consent are done. Pt state colonoscopy to be done probably the 1st week of Dec is what the surgeon office is looking at. No sooner appts with Dr. Rosemary Holms in office. Pt states she will do tele and keep her appt with Dr. Rosemary Holms.

## 2023-07-10 NOTE — Telephone Encounter (Signed)
It appears as though Eliquis was stopped at last appointment with Dr. Lalla Brothers

## 2023-07-10 NOTE — Telephone Encounter (Signed)
Pt has been scheduled a tele pre op appt 08/06/23. Med rec and consent are done. Pt state colonoscopy to be done probably the 1st week of Dec is what the surgeon office is looking at. No sooner appts with Dr. Rosemary Holms in office. Pt states she will do tele and keep her appt with Dr. Rosemary Holms.     Patient Consent for Virtual Visit        Colleen [name] has provided verbal consent on 07/10/2023 for a virtual visit (video or telephone).   CONSENT FOR VIRTUAL VISIT FOR:  Colleen [name]  By participating in this virtual visit I agree to the following:  I hereby voluntarily request, consent and authorize [facility] and its employed or contracted physicians, physician assistants, nurse practitioners or other licensed health care professionals (the Practitioner), to provide me with telemedicine health care services (the "Services") as deemed necessary by the treating Practitioner. I acknowledge and consent to receive the Services by the Practitioner via telemedicine. I understand that the telemedicine visit will involve communicating with the Practitioner through live audiovisual communication technology and the disclosure of certain medical information by electronic transmission. I acknowledge that I have been given the opportunity to request an in-person assessment or other available alternative prior to the telemedicine visit and am voluntarily participating in the telemedicine visit.  I understand that I have the right to withhold or withdraw my consent to the use of telemedicine in the course of my care at any time, without affecting my right to future care or treatment, and that the Practitioner or I may terminate the telemedicine visit at any time. I understand that I have the right to inspect all information obtained and/or recorded in the course of the telemedicine visit and may receive copies of available information for a reasonable fee.  I understand that some of the potential  risks of receiving the Services via telemedicine include:  Delay or interruption in medical evaluation due to technological equipment failure or disruption; Information transmitted may not be sufficient (e.g. poor resolution of images) to allow for appropriate medical decision making by the Practitioner; and/or  In rare instances, security protocols could fail, causing a breach of personal health information.  Furthermore, I acknowledge that it is my responsibility to provide information about my medical history, conditions and care that is complete and accurate to the best of my ability. I acknowledge that Practitioner's advice, recommendations, and/or decision may be based on factors not within their control, such as incomplete or inaccurate data provided by me or distortions of diagnostic images or specimens that may result from electronic transmissions. I understand that the practice of medicine is not an exact science and that Practitioner makes no warranties or guarantees regarding treatment outcomes. I acknowledge that a copy of this consent can be made available to me via my patient portal [facility] MyChart), or I can request a printed copy by calling the office of [facility].    I understand that my insurance will be billed for this visit.   I have read or had this consent read to me. I understand the contents of this consent, which adequately explains the benefits and risks of the Services being provided via telemedicine.  I have been provided ample opportunity to ask questions regarding this consent and the Services and have had my questions answered to my satisfaction. I give my informed consent for the services to be provided through the use of telemedicine in my medical care

## 2023-07-10 NOTE — Telephone Encounter (Signed)
Pre-operative Risk Assessment    Patient Name: Colleen [name]  DOB: 07-10-1966 MRN: [id number]  DATE OF LAST VISIT: 03/02/23 DR. Lalla Brothers; THOUGH LOOKS TO BE PT WAS FOLLOWING DR. Rosemary Holms AND NOW SEEMS TO BE FOLLOWING [facility] DR. Windy Fast  DATE OF NEXT VISIT: 09/06/23 DR. PATWARDHAN    Request for Surgical Clearance    Procedure:   COLONOSCOPY  Date of Surgery:  Clearance TBD                                 Surgeon:  DR. Dulce Sellar Surgeon's Group or Practice Name:  [facility] Phone number:  [phone] Fax number:  [phone]   Type of Clearance Requested:   - Medical  - Pharmacy:  Hold Apixaban (Eliquis)     Type of Anesthesia:   PROPOFOL   Additional requests/questions:    Elpidio Anis   07/10/2023, 1:53 PM

## 2023-07-13 NOTE — Progress Notes (Signed)
Remote pacemaker transmission.   

## 2023-07-19 ENCOUNTER — Telehealth: Payer: Self-pay | Admitting: Cardiology

## 2023-07-19 NOTE — Telephone Encounter (Signed)
Attempted to return pt call. No answer. Lvm letting patient know that the earliest we can reschedule her tele visit is 08/01/23

## 2023-07-19 NOTE — Telephone Encounter (Addendum)
Patient is currently scheduled for 11/11 for a telephone visit.  She would like to get that moved up, as the doctor who is doing her procedure only has 12/4 and 12/18 available to do the procedure for the month of December.   She is concerned those days will fill up.

## 2023-07-19 NOTE — Telephone Encounter (Signed)
Patient returned call

## 2023-07-19 NOTE — Telephone Encounter (Signed)
Returned patient call again. No answer

## 2023-07-20 NOTE — Telephone Encounter (Signed)
App date and time rescheduled per patient request to 11/6 @1 :40 pm for tele visit

## 2023-07-30 ENCOUNTER — Encounter (HOSPITAL_COMMUNITY): Payer: Self-pay | Admitting: Gastroenterology

## 2023-07-30 ENCOUNTER — Other Ambulatory Visit: Payer: Self-pay

## 2023-07-30 ENCOUNTER — Other Ambulatory Visit: Payer: Self-pay | Admitting: Gastroenterology

## 2023-07-30 NOTE — Progress Notes (Addendum)
PCP - [facility] Dr. Burnadette Pop Cardiologist - Dr. Aggie Cosier  EP-  Dr. Lalla Brothers  Endo. Dr. Cleon Dew [facility]   PPM/ICD - pacemaker last device check 07-06-23 Device Orders - Waiting for orders in epic Rep Notified -   Chest x-ray -  EKG - 03-02-23 epic Stress Test -  ECHO - 12-29-22 TEE Cardiac Cath -   Sleep Study -  CPAP -   Fasting Blood Sugar -  Checks Blood Sugar _____ times a day  Blood Thinner Instructions:NO Aspirin Instructions:NO  ERAS Protcol - PRE-SURGERY N/A  OZEMPIC pt. Aware to HOLD one week prior   COVID vaccine -yes  Activity-- Able to complete ADL's without CP or SOB  Anesthesia review: DM 1.5 (LADA) on insulin pump , CABG, STEMI, HTN, CKD 3b, Ablation, LAA CLIP , Maze Procedure, pacemaker  Patient denies shortness of breath, fever, cough and chest pain at PAT appointment   All instructions explained to the patient, with a verbal understanding of the material. Patient agrees to go over the instructions while at home for a better understanding. Patient also instructed to self quarantine after being tested for COVID-19. The opportunity to ask questions was provided.

## 2023-08-01 ENCOUNTER — Ambulatory Visit: Payer: 59 | Attending: Cardiology

## 2023-08-01 DIAGNOSIS — Z01810 Encounter for preprocedural cardiovascular examination: Secondary | ICD-10-CM

## 2023-08-01 NOTE — Progress Notes (Signed)
Virtual Visit via Telephone Note   Because of Colleen [name]'s co-morbid illnesses, she is at least at moderate risk for complications without adequate follow up.  This format is felt to be most appropriate for this patient at this time.  The patient did not have access to video technology/had technical difficulties with video requiring transitioning to audio format only (telephone).  All issues noted in this document were discussed and addressed.  No physical exam could be performed with this format.  Please refer to the patient's chart for her consent to telehealth for [facility].  Evaluation Performed:  Preoperative cardiovascular risk assessment _____________   Date:  08/01/2023   Patient ID:  Colleen [name], DOB 1966/01/15, MRN [id number] Patient Location:  Home Provider location:   Office  Primary Care Provider:  Joycelyn Rua, MD Primary Cardiologist:  Elder Negus, MD  Chief Complaint / Patient Profile   57 y.o. y/o female with a h/o CAD s/p STEMI treated with CABG 2023, HFrEF, atrial flutter (no longer on [facility]), CHB s/p PPM, DM type II, HLD, HTN who is pending colonoscopy and presents today for telephonic preoperative cardiovascular risk assessment.  History of Present Illness    Colleen [name] is a 57 y.o. female who presents via Web designer for a telehealth visit today.  Pt was last seen in cardiology clinic on 03/02/2023 by Dr. Lalla Brothers.  At that time Colleen [name] was doing well with no recurrence of arrhythmia.  She had Eliquis stopped at that time.  The patient is now pending procedure as outlined above. Since her last visit, she reports that she has been doing well with no episodes of arrhythmia or angina.  She continues to stay active around her home and is able to do ADLs independently.  She does note that her blood pressure runs low due to previous weight loss.  Her most recent blood pressure was 110/72.  She denies chest pain,  shortness of breath, lower extremity edema, fatigue, palpitations, melena, hematuria, hemoptysis, diaphoresis, weakness, presyncope, syncope, orthopnea, and PND.     Past Medical History    Past Medical History:  Diagnosis Date   Acute on chronic right heart failure (HCC) 01/27/2021   Acute respiratory failure with hypoxemia (HCC)    AKI (acute kidney injury) (HCC)    Anemia    Asthma    Chest pain    CHF (congestive heart failure) (HCC)    Complication of anesthesia    Psuedocholinerasterase deficiency per pt  Lab confirmed   Coronary artery disease    Diabetes mellitus    TYPE II   Diabetic macular edema (HCC)    Pt gets injections in eyes with Eyelea   Dysrhythmia    Afib/flutter, CHB   GERD (gastroesophageal reflux disease)    Hypertension    Myocardial infarction (HCC)    STEMI 11-2020 late presenting   Neuromuscular disorder (HCC)    neuropathy lower legs and feet   Paroxysmal atrial flutter (HCC) 12/28/2020   Pneumonia    PONV (postoperative nausea and vomiting)    Presence of permanent cardiac pacemaker    one lead NOT MRI compatabile   Seasonal allergies    Past Surgical History:  Procedure Laterality Date   ATRIAL FIBRILLATION ABLATION N/A 12/19/2022   Procedure: ATRIAL FIBRILLATION ABLATION;  Surgeon: Lanier Prude, MD;  [facility]: [facility];  Service: Cardiovascular;  Laterality: N/A;   BACK SURGERY  2009   CATARACT SURGERY  BILATERAL   CESAREAN SECTION     X2   CORONARY ARTERY BYPASS GRAFT  12/05/2021   KNEE SURGERY Right    LAA clip     LEFT HEART CATH AND CORONARY ANGIOGRAPHY N/A 05/08/2017   Procedure: Left Heart Cath and Coronary Angiography;  Surgeon: Elder Negus, MD;  Location: [facility];  Service: Cardiovascular;  Laterality: N/A;   MAZE     menincus repair     ORIF ANKLE FRACTURE Left 03/23/2016   Procedure: OPEN REDUCTION INTERNAL FIXATION (ORIF) ANKLE FRACTURE MALLEOLUS  MALUNION WITH TIBIAL AND FIBULAR  OSTEOTOMY AND AUTOGRAFT;  Surgeon: Toni Arthurs, MD;  Location: [facility];  Service: Orthopedics;  Laterality: Left;   PACEMAKER IMPLANT N/A 01/06/2021   Procedure: PACEMAKER IMPLANT;  Surgeon: Lanier Prude, MD;  Location: [facility] INVASIVE CV LAB;  Service: Cardiovascular;  Laterality: N/A;   RIGHT/LEFT HEART CATH AND CORONARY ANGIOGRAPHY N/A 12/24/2020   Procedure: RIGHT/LEFT HEART CATH AND CORONARY ANGIOGRAPHY;  Surgeon: Elder Negus, MD;  Location: [facility];  Service: Cardiovascular;  Laterality: N/A;   SHOULDER ARTHROSCOPY W/ ROTATOR CUFF REPAIR Left    TEE WITHOUT CARDIOVERSION N/A 12/19/2022   Procedure: TRANSESOPHAGEAL ECHOCARDIOGRAM;  Surgeon: Lanier Prude, MD;  Location: [facility] INVASIVE CV LAB;  Service: Cardiovascular;  Laterality: N/A;   TEMPORARY PACEMAKER N/A 12/19/2020   Procedure: TEMPORARY PACEMAKER;  Surgeon: Elder Negus, MD;  [facility]: [facility];  Service: Cardiovascular;  Laterality: N/A;   TEMPORARY PACEMAKER N/A 12/19/2020   Procedure: TEMPORARY PACEMAKER;  Surgeon: Elder Negus, MD;  [facility]: [facility];  Service: Cardiovascular;  Laterality: N/A;   TEMPORARY PACEMAKER N/A 01/02/2021   Procedure: TEMPORARY PACEMAKER;  Surgeon: Swazil[place], Peter M, MD;  Location: [facility] INVASIVE CV LAB;  Service: Cardiovascular;  Laterality: N/A;    Allergies  Allergies  Allergen Reactions   Gadolinium Derivatives      Kidney disease   Iodine Other (See Comments)   Iohexol Other (See Comments)   Nsaids     Other Reaction(s): Kidney disease   Other Other (See Comments)    Contrast Dye   Succinylcholine Other (See Comments)    Pseudocholinesterase deficiency    Home Medications    Prior to Admission medications   Medication Sig Start Date End Date Taking? Authorizing Provider  albuterol (PROVENTIL HFA;VENTOLIN HFA) 108 (90 Base) MCG/ACT inhaler Inhale 1-2 puffs into the lungs every 6 (six) hours as needed for  wheezing or shortness of breath (Asthma).    [provider]  amiodarone (PACERONE) 100 MG tablet Take 1 tablet (100 mg total) by mouth daily. 03/02/23   Lanier Prude, MD  apixaban (ELIQUIS) 5 MG TABS tablet Take 1 tablet (5 mg total) by mouth 2 (two) times daily. Patient not taking: Reported on 07/10/2023 10/11/22   Elder Negus, MD  Cholecalciferol (VITAMIN D) 2000 units tablet Take 4,000 Units by mouth daily.    [provider]  Cyanocobalamin (B-12) 2500 MCG SUBL Place 2,500 mcg under the tongue daily.    [provider]  cyclobenzaprine (FLEXERIL) 5 MG tablet Take 5 mg by mouth at bedtime as needed. 10/12/22   [provider]  famotidine (PEPCID) 20 MG tablet TAKE 1 TABLET BY MOUTH TWICE  DAILY 02/20/23   Patwardhan, Anabel Bene, MD  folic acid (FOLVITE) 800 MCG tablet Take 800 mcg by mouth daily.    [provider]  insulin lispro (HUMALOG) 100 UNIT/ML injection  Inject into the skin See admin instructions. Insulin Pump - Mini Medtronic 670G    [provider]  loratadine (CLARITIN) 10 MG tablet Take 10 mg by mouth daily as needed for allergies.    [provider]  metoprolol succinate (TOPROL-XL) 25 MG 24 hr tablet Take 1 tablet (25 mg total) by mouth daily. Take with or immediately following a meal. Patient not taking: Reported on 07/10/2023 01/15/23   Patwardhan, Anabel Bene, MD  nitroGLYCERIN (NITROSTAT) 0.4 MG SL tablet Place 1 tablet (0.4 mg total) under the tongue every 5 (five) minutes x 3 doses as needed for chest pain. Patient not taking: Reported on 07/10/2023 12/29/20   Elder Negus, MD  OZEMPIC, 0.25 OR 0.5 MG/DOSE, 2 MG/3ML SOPN Inject 0.375 mg into the skin every Tuesday. 08/03/22   [provider]  rosuvastatin (CRESTOR) 10 MG tablet TAKE 1 TABLET BY MOUTH AT  BEDTIME 07/27/22   Patwardhan, Manish J, MD  sacubitril-valsartan (ENTRESTO) 24-26 MG Take 1 tablet by mouth 2 (two) times daily. 12/29/22    Graciella Freer, PA-C  tobramycin (TOBREX) 0.3 % ophthalmic solution Place 2 drops into both eyes as needed (prior to eye injections every 4 months). 12/03/18   [provider]  torsemide (DEMADEX) 10 MG tablet TAKE 1 TABLET BY MOUTH DAILY Patient not taking: Reported on 07/10/2023 05/15/23   Elder Negus, MD    Physical Exam    Vital Signs:  Edwardine Deschepper Kessen does not have vital signs available for review today.110/72  Given telephonic nature of communication, physical exam is limited. AAOx3. NAD. Normal affect.  Speech and respirations are unlabored.  Accessory Clinical Findings    None  Assessment & Plan    1.  Preoperative Cardiovascular Risk Assessment: -Patient's RCRI score is 6.6%  The patient affirms she has been doing well without any new cardiac symptoms. They are able to achieve 5 METS without cardiac limitations. Therefore, based on ACC/AHA guidelines, the patient would be at acceptable risk for the planned procedure without further cardiovascular testing. The patient was advised that if she develops new symptoms prior to surgery to contact our office to arrange for a follow-up visit, and she verbalized understanding.   The patient was advised that if she develops new symptoms prior to surgery to contact our office to arrange for a follow-up visit, and she verbalized understanding.   Eliquis stopped post ablation by Dr. Lalla Brothers 03/02/2023  A copy of this note will be routed to requesting surgeon.  Time:   Today, I have spent 7 minutes with the patient with telehealth technology discussing medical history, symptoms, and management plan.     Napoleon Form, Leodis Rains, NP  08/01/2023, 7:29 AM

## 2023-08-06 ENCOUNTER — Telehealth: Payer: 59

## 2023-08-07 ENCOUNTER — Encounter (HOSPITAL_COMMUNITY)
Admission: RE | Disposition: A | Discharge status: Home / Self Care | Payer: Self-pay | Source: Home / Self Care | Attending: Gastroenterology

## 2023-08-07 ENCOUNTER — Other Ambulatory Visit: Payer: Self-pay

## 2023-08-07 ENCOUNTER — Ambulatory Visit (HOSPITAL_COMMUNITY): Payer: 59 | Admitting: Anesthesiology

## 2023-08-07 ENCOUNTER — Ambulatory Visit (HOSPITAL_COMMUNITY)
Admission: RE | Admit: 2023-08-07 | Discharge: 2023-08-07 | Disposition: A | Discharge status: Home / Self Care | Payer: 59 | Attending: Gastroenterology | Admitting: Gastroenterology

## 2023-08-07 ENCOUNTER — Encounter (HOSPITAL_COMMUNITY): Payer: Self-pay | Admitting: Gastroenterology

## 2023-08-07 DIAGNOSIS — Z87891 Personal history of nicotine dependence: Secondary | ICD-10-CM | POA: Diagnosis not present

## 2023-08-07 DIAGNOSIS — I509 Heart failure, unspecified: Secondary | ICD-10-CM | POA: Insufficient documentation

## 2023-08-07 DIAGNOSIS — K649 Unspecified hemorrhoids: Secondary | ICD-10-CM | POA: Diagnosis not present

## 2023-08-07 DIAGNOSIS — E119 Type 2 diabetes mellitus without complications: Secondary | ICD-10-CM | POA: Diagnosis not present

## 2023-08-07 DIAGNOSIS — Z800 Family history of malignant neoplasm of digestive organs: Secondary | ICD-10-CM | POA: Diagnosis not present

## 2023-08-07 DIAGNOSIS — I4891 Unspecified atrial fibrillation: Secondary | ICD-10-CM | POA: Diagnosis not present

## 2023-08-07 DIAGNOSIS — K6389 Other specified diseases of intestine: Secondary | ICD-10-CM | POA: Diagnosis not present

## 2023-08-07 DIAGNOSIS — Z9641 Presence of insulin pump (external) (internal): Secondary | ICD-10-CM | POA: Diagnosis not present

## 2023-08-07 DIAGNOSIS — I38 Endocarditis, valve unspecified: Secondary | ICD-10-CM | POA: Diagnosis not present

## 2023-08-07 DIAGNOSIS — Z951 Presence of aortocoronary bypass graft: Secondary | ICD-10-CM | POA: Diagnosis not present

## 2023-08-07 DIAGNOSIS — J45909 Unspecified asthma, uncomplicated: Secondary | ICD-10-CM | POA: Diagnosis not present

## 2023-08-07 DIAGNOSIS — I110 Hypertensive heart disease with heart failure: Secondary | ICD-10-CM | POA: Insufficient documentation

## 2023-08-07 DIAGNOSIS — K219 Gastro-esophageal reflux disease without esophagitis: Secondary | ICD-10-CM | POA: Diagnosis not present

## 2023-08-07 DIAGNOSIS — Z860100 Personal history of colon polyps, unspecified: Secondary | ICD-10-CM

## 2023-08-07 DIAGNOSIS — Z950 Presence of cardiac pacemaker: Secondary | ICD-10-CM | POA: Insufficient documentation

## 2023-08-07 DIAGNOSIS — Z1211 Encounter for screening for malignant neoplasm of colon: Secondary | ICD-10-CM | POA: Insufficient documentation

## 2023-08-07 DIAGNOSIS — Z794 Long term (current) use of insulin: Secondary | ICD-10-CM | POA: Insufficient documentation

## 2023-08-07 DIAGNOSIS — I2510 Atherosclerotic heart disease of native coronary artery without angina pectoris: Secondary | ICD-10-CM | POA: Insufficient documentation

## 2023-08-07 HISTORY — DX: Presence of cardiac pacemaker: Z95.0

## 2023-08-07 HISTORY — DX: Essential (primary) hypertension: I10

## 2023-08-07 HISTORY — DX: Other specified postprocedural states: Z98.890

## 2023-08-07 HISTORY — DX: Heart failure, unspecified: I50.9

## 2023-08-07 HISTORY — DX: Cardiac arrhythmia, unspecified: I49.9

## 2023-08-07 HISTORY — DX: Myoneural disorder, unspecified: G70.9

## 2023-08-07 HISTORY — DX: Acute myocardial infarction, unspecified: I21.9

## 2023-08-07 HISTORY — DX: Atherosclerotic heart disease of native coronary artery without angina pectoris: I25.10

## 2023-08-07 HISTORY — PX: COLONOSCOPY WITH PROPOFOL: SHX5780

## 2023-08-07 HISTORY — DX: Anemia, unspecified: D64.9

## 2023-08-07 HISTORY — DX: Pneumonia, unspecified organism: J18.9

## 2023-08-07 LAB — GLUCOSE, CAPILLARY: Glucose-Capillary: 118 mg/dL — ABNORMAL HIGH (ref 70–99)

## 2023-08-07 SURGERY — COLONOSCOPY WITH PROPOFOL
Anesthesia: Monitor Anesthesia Care | Laterality: Bilateral

## 2023-08-07 MED ORDER — SODIUM CHLORIDE 0.9 % IV SOLN: INTRAVENOUS | Status: DC

## 2023-08-07 MED ORDER — PROPOFOL 500 MG/50ML IV EMUL
INTRAVENOUS | Status: DC | PRN
  Administered 2023-08-07: 125 ug/kg/min via INTRAVENOUS

## 2023-08-07 MED ORDER — PROPOFOL 10 MG/ML IV BOLUS
INTRAVENOUS | Status: DC | PRN
  Administered 2023-08-07: 30 mg via INTRAVENOUS

## 2023-08-07 MED ORDER — LIDOCAINE HCL (CARDIAC) PF 100 MG/5ML IV SOSY
PREFILLED_SYRINGE | INTRAVENOUS | Status: DC | PRN
  Administered 2023-08-07: 60 mg via INTRAVENOUS

## 2023-08-07 SURGICAL SUPPLY — 22 items
ELECT REM PT RETURN 9FT ADLT (ELECTROSURGICAL)
ELECTRODE REM PT RTRN 9FT ADLT (ELECTROSURGICAL) IMPLANT
FCP BXJMBJMB 240X2.8X (CUTTING FORCEPS)
FLOOR PAD 36X40 (MISCELLANEOUS) ×1
FORCEPS BIOP RAD 4 LRG CAP 4 (CUTTING FORCEPS) IMPLANT
FORCEPS BIOP RJ4 240 W/NDL (CUTTING FORCEPS)
FORCEPS BXJMBJMB 240X2.8X (CUTTING FORCEPS) IMPLANT
INJECTOR/SNARE I SNARE (MISCELLANEOUS) IMPLANT
LUBRICANT JELLY 4.5OZ STERILE (MISCELLANEOUS) IMPLANT
MANIFOLD NEPTUNE II (INSTRUMENTS) IMPLANT
NDL SCLEROTHERAPY 25GX240 (NEEDLE) IMPLANT
NEEDLE SCLEROTHERAPY 25GX240 (NEEDLE)
PAD FLOOR 36X40 (MISCELLANEOUS) ×1 IMPLANT
PROBE APC STR FIRE (PROBE) IMPLANT
PROBE INJECTION GOLD (MISCELLANEOUS)
PROBE INJECTION GOLD 7FR (MISCELLANEOUS) IMPLANT
SNARE ROTATE MED OVAL 20MM (MISCELLANEOUS) IMPLANT
SYR 50ML LL SCALE MARK (SYRINGE) IMPLANT
TRAP SPECIMEN MUCOUS 40CC (MISCELLANEOUS) IMPLANT
TUBING ENDO SMARTCAP PENTAX (MISCELLANEOUS) IMPLANT
TUBING IRRIGATION ENDOGATOR (MISCELLANEOUS) ×1 IMPLANT
WATER STERILE IRR 1000ML POUR (IV SOLUTION) IMPLANT

## 2023-08-07 NOTE — Transfer of Care (Signed)
Immediate Anesthesia Transfer of Care Note  Patient: Colleen [name]  Procedure(s) Performed: COLONOSCOPY WITH PROPOFOL (Bilateral)  Patient Location: Endoscopy Unit  Anesthesia Type:MAC  Level of Consciousness: awake, alert , and oriented  Airway & Oxygen Therapy: Patient Spontanous Breathing  Post-op Assessment: Report given to RN and Post -op Vital signs reviewed and stable  Post vital signs: Reviewed and stable  Last Vitals:  Vitals Value Taken Time  BP 112/52 08/07/23 0952  Temp    Pulse 66 08/07/23 0953  Resp 18 08/07/23 0953  SpO2 100 % 08/07/23 0953  Vitals shown include unfiled device data.  Last Pain:  Vitals:   08/07/23 0837  TempSrc: Temporal  PainSc: 0-No pain         Complications: No notable events documented.

## 2023-08-07 NOTE — H&P (Signed)
[facility] H/P Note  Chief Complaint: colon polyp surveillance HPI: Colleen [name] is an 57 y.o. female.  Personal history colonic polyps.  Family history colon cancer.  No abdominal symptoms.  Last colonoscopy 2019.  Past Medical History:  Diagnosis Date   Acute on chronic right heart failure (HCC) 01/27/2021   Acute respiratory failure with hypoxemia (HCC)    AKI (acute kidney injury) (HCC)    Anemia    Asthma    Chest pain    CHF (congestive heart failure) (HCC)    Complication of anesthesia    Psuedocholinerasterase deficiency per pt  Lab confirmed   Coronary artery disease    Diabetes mellitus    TYPE II   Diabetic macular edema (HCC)    Pt gets injections in eyes with Eyelea   Dysrhythmia    Afib/flutter, CHB   GERD (gastroesophageal reflux disease)    Hypertension    Myocardial infarction (HCC)    STEMI 11-2020 late presenting   Neuromuscular disorder (HCC)    neuropathy lower legs and feet   Paroxysmal atrial flutter (HCC) 12/28/2020   Pneumonia    PONV (postoperative nausea and vomiting)    Presence of permanent cardiac pacemaker    one lead NOT MRI compatabile   Seasonal allergies     Past Surgical History:  Procedure Laterality Date   ATRIAL FIBRILLATION ABLATION N/A 12/19/2022   Procedure: ATRIAL FIBRILLATION ABLATION;  Surgeon: Lanier Prude, MD;  [facility];  Service: Cardiovascular;  Laterality: N/A;   BACK SURGERY  2009   CATARACT SURGERY     BILATERAL   CESAREAN SECTION     X2   CORONARY ARTERY BYPASS GRAFT  12/05/2021   KNEE SURGERY Right    LAA clip     LEFT HEART CATH AND CORONARY ANGIOGRAPHY N/A 05/08/2017   Procedure: Left Heart Cath and Coronary Angiography;  Surgeon: Elder Negus, MD;  Location: [facility];  Service: Cardiovascular;  Laterality: N/A;   MAZE     menincus repair     ORIF ANKLE FRACTURE Left 03/23/2016   Procedure: OPEN REDUCTION INTERNAL FIXATION (ORIF) ANKLE FRACTURE  MALLEOLUS  MALUNION WITH TIBIAL AND FIBULAR OSTEOTOMY AND AUTOGRAFT;  Surgeon: Toni Arthurs, MD;  Location: [facility];  Service: Orthopedics;  Laterality: Left;   PACEMAKER IMPLANT N/A 01/06/2021   Procedure: PACEMAKER IMPLANT;  Surgeon: Lanier Prude, MD;  Location: [facility] INVASIVE CV LAB;  Service: Cardiovascular;  Laterality: N/A;   RIGHT/LEFT HEART CATH AND CORONARY ANGIOGRAPHY N/A 12/24/2020   Procedure: RIGHT/LEFT HEART CATH AND CORONARY ANGIOGRAPHY;  Surgeon: Elder Negus, MD;  [facility]: [facility];  Service: Cardiovascular;  Laterality: N/A;   SHOULDER ARTHROSCOPY W/ ROTATOR CUFF REPAIR Left    TEE WITHOUT CARDIOVERSION N/A 12/19/2022   Procedure: TRANSESOPHAGEAL ECHOCARDIOGRAM;  Surgeon: Lanier Prude, MD;  Location: [facility] INVASIVE CV LAB;  Service: Cardiovascular;  Laterality: N/A;   TEMPORARY PACEMAKER N/A 12/19/2020   Procedure: TEMPORARY PACEMAKER;  Surgeon: Elder Negus, MD;  [facility]: [facility];  Service: Cardiovascular;  Laterality: N/A;   TEMPORARY PACEMAKER N/A 12/19/2020   Procedure: TEMPORARY PACEMAKER;  Surgeon: Elder Negus, MD;  Location: [facility];  Service: Cardiovascular;  Laterality: N/A;   TEMPORARY PACEMAKER N/A 01/02/2021   Procedure: TEMPORARY PACEMAKER;  Surgeon: Swazil[place], Peter M, MD;  Location: [facility] INVASIVE CV LAB;  Service: Cardiovascular;  Laterality: N/A;    Medications Prior to Admission  Medication Sig Dispense Refill   albuterol (PROVENTIL HFA;VENTOLIN HFA) 108 (90 Base) MCG/ACT inhaler Inhale 1-2 puffs into the lungs every 6 (six) hours as needed for wheezing or shortness of breath (Asthma).     amiodarone (PACERONE) 100 MG tablet Take 1 tablet (100 mg total) by mouth daily. 90 tablet 3   budesonide-formoterol (SYMBICORT) 80-4.5 MCG/ACT inhaler Inhale 2 puffs into the lungs 2 (two) times daily.     cetirizine (ZYRTEC) 10 MG tablet Take 10 mg by mouth as needed for allergies.      Cholecalciferol (VITAMIN D) 2000 units tablet Take 4,000 Units by mouth daily.     Cyanocobalamin (B-12) 2500 MCG SUBL Place 2,500 mcg under the tongue daily.     famotidine (PEPCID) 20 MG tablet TAKE 1 TABLET BY MOUTH TWICE  DAILY 180 tablet 3   folic acid (FOLVITE) 800 MCG tablet Take 800 mcg by mouth daily.     insulin lispro (HUMALOG) 100 UNIT/ML injection Inject into the skin See admin instructions. Insulin Pump - Mini Medtronic 670G     loratadine (CLARITIN) 10 MG tablet Take 10 mg by mouth daily as needed for allergies.     metoprolol succinate (TOPROL-XL) 25 MG 24 hr tablet Take 1 tablet (25 mg total) by mouth daily. Take with or immediately following a meal. (Patient taking differently: Take 12.5 mg by mouth daily. Take with or immediately following a meal.) 90 tablet 3   nitroGLYCERIN (NITROSTAT) 0.4 MG SL tablet Place 1 tablet (0.4 mg total) under the tongue every 5 (five) minutes x 3 doses as needed for chest pain. 25 tablet 1   rosuvastatin (CRESTOR) 10 MG tablet TAKE 1 TABLET BY MOUTH AT  BEDTIME 90 tablet 3   sacubitril-valsartan (ENTRESTO) 24-26 MG Take 1 tablet by mouth 2 (two) times daily.     tobramycin (TOBREX) 0.3 % ophthalmic solution Place 2 drops into both eyes as needed (prior to eye injections every 4 months).     torsemide (DEMADEX) 10 MG tablet TAKE 1 TABLET BY MOUTH DAILY (Patient taking differently: Take 10 mg by mouth daily as needed (Per pt, taking PRN).) 90 tablet 3   apixaban (ELIQUIS) 5 MG TABS tablet Take 1 tablet (5 mg total) by mouth 2 (two) times daily. (Patient not taking: Reported on 07/10/2023) 180 tablet 3   cyclobenzaprine (FLEXERIL) 5 MG tablet Take 5 mg by mouth at bedtime as needed.     OZEMPIC, 0.25 OR 0.5 MG/DOSE, 2 MG/3ML SOPN Inject 0.375 mg into the skin every Tuesday.      Allergies:  Allergies  Allergen Reactions   Gadolinium Derivatives      Kidney disease   Iodine Other (See Comments)   Iohexol Other (See Comments)   Nsaids     Other  Reaction(s): Kidney disease   Other Other (See Comments)    Contrast Dye   Succinylcholine Other (See Comments)    Pseudocholinesterase deficiency    Family History  Problem Relation Age of Onset   Hypertension Father    Colon cancer Father 64   Hypertension Paternal Aunt    Hypertension Paternal Grandmother    Hypertension Paternal Grandfather     Social History:  reports that she quit smoking about 36 years ago. Her smoking use included cigarettes. She started smoking about 38 years ago. She has a 0.5 pack-year smoking history. She has never used smokeless tobacco. She reports current alcohol use. She reports that she does not use drugs.   ROS: As per  HPI, all others negative   Blood pressure (!) 153/59, pulse 75, temperature (!) 97.5 F (36.4 C), temperature source Temporal, resp. rate 10, height 5\' 3"  (1.6 m), weight 81.6 kg, SpO2 100%. General appearance: NAD HEENT:  [place]/AT, anicteric CV:  Pacemaker, RESP:  No visible distress ABD:  Soft, non-tender NEURO:  No encephalopathy  Results for orders placed or performed during the hospital encounter of 08/07/23 (from the past 48 hour(s))  Glucose, capillary     Status: Abnormal   Collection Time: 08/07/23  8:44 AM  Result Value Ref Range   Glucose-Capillary 118 (H) 70 - 99 mg/dL    Comment: Glucose reference range applies only to samples taken after fasting for at least 8 hours.   No results found.  Assessment/Plan   Personal history of colonic polyps. Family history colon cancer. Atrial fibrillation. Valvular heart disease. Colonoscopy. Risks (bleeding, infection, bowel perforation that could require surgery, sedation-related changes in cardiopulmonary systems), benefits (identification and possible treatment of source of symptoms, exclusion of certain causes of symptoms), and alternatives (watchful waiting, radiographic imaging studies, empiric medical treatment) of colonoscopy were explained to patient/family in detail  and patient wishes to proceed.   Freddy Jaksch 08/07/2023, 9:09 AM

## 2023-08-07 NOTE — Discharge Instructions (Signed)

## 2023-08-07 NOTE — Anesthesia Postprocedure Evaluation (Signed)
Anesthesia Post Note  Patient: Colleen [name]  Procedure(s) Performed: COLONOSCOPY WITH PROPOFOL (Bilateral)     Patient location during evaluation: PACU Anesthesia Type: MAC Level of consciousness: awake and alert Pain management: pain level controlled Vital Signs Assessment: post-procedure vital signs reviewed and stable Respiratory status: spontaneous breathing, nonlabored ventilation and respiratory function stable Cardiovascular status: blood pressure returned to baseline and stable Postop Assessment: no apparent nausea or vomiting Anesthetic complications: no   No notable events documented.  Last Vitals:  Vitals:   08/07/23 1020 08/07/23 1024  BP: (!) 162/71 (!) 151/69  Pulse: 71 70  Resp: 15 (!) 23  Temp:    SpO2: 100% 100%    Last Pain:  Vitals:   08/07/23 1024  TempSrc:   PainSc: 0-No pain                 Lowella Curb

## 2023-08-07 NOTE — Anesthesia Preprocedure Evaluation (Signed)
Anesthesia Evaluation  Patient identified by MRN, date of birth, ID band Patient awake    Reviewed: Allergy & Precautions, NPO status , Patient's Chart, lab work & pertinent test results, reviewed documented beta blocker date and time   History of Anesthesia Complications (+) PONV, PSEUDOCHOLINESTERASE DEFICIENCY and history of anesthetic complications  Airway Mallampati: I  TM Distance: >3 FB Neck ROM: Full    Dental  (+) Dental Advisory Given, Teeth Intact, Caps   Pulmonary asthma , former smoker   breath sounds clear to auscultation       Cardiovascular hypertension, Pt. on medications and Pt. on home beta blockers (-) angina + CAD, + CABG and +CHF (Entresto)  + dysrhythmias Atrial Fibrillation + pacemaker  Rhythm:Regular Rate:Normal  '23 ECHO: Mild concentric hypertrophy of LV. Abnormal septal wall motion due to post-operative coronary artery bypass graft. Mild global hypokinesis. LVEF 50-55%.  Doppler evidence of grade II (pseudonormal) diastolic dysfunction,  elevated LAP.  Right ventricle cavity not well visualized, but at least mildly dilated with at least mildly reduced systolic function.  Device lead is seen.  Left atrial cavity is mildly dilated.  Right atrial cavity is mild to moderately dilated.  Mild (Grade I) MR.  Moderate TR. Estimated pulmonary artery systolic pressure at least 26 mmHg.     Neuro/Psych negative neurological ROS     GI/Hepatic Neg liver ROS,GERD  Medicated and Controlled,,  Endo/Other  diabetes, Insulin Dependent  Ozempic: last dose 3/18  Renal/GU Renal InsufficiencyRenal disease     Musculoskeletal   Abdominal  (+) + obese  Peds  Hematology eliquis   Anesthesia Other Findings   Reproductive/Obstetrics                             Anesthesia Physical Anesthesia Plan  ASA: 3  Anesthesia Plan: MAC   Post-op Pain Management: Minimal or no pain  anticipated   Induction: Intravenous  PONV Risk Score and Plan: 3 and Propofol infusion and Treatment may vary due to age or medical condition  Airway Management Planned: Simple Face Mask  Additional Equipment: None  Intra-op Plan:   Post-operative Plan:   Informed Consent: I have reviewed the patients History and Physical, chart, labs and discussed the procedure including the risks, benefits and alternatives for the proposed anesthesia with the patient or authorized representative who has indicated his/her understanding and acceptance.     Dental advisory given  Plan Discussed with: CRNA and Surgeon  Anesthesia Plan Comments:         Anesthesia Quick Evaluation

## 2023-08-07 NOTE — Op Note (Signed)
[facility] Patient Name: Colleen [name] Procedure Date: 08/07/2023 MRN: [id number] Attending MD: Willis Modena , MD, [id number] Date of Birth: 01-21-1966 CSN: [id number] Age: 57 Admit Type: Outpatient Procedure:                Colonoscopy Indications:              High risk colon cancer surveillance: Personal                            history of colonic polyps, Family history of colon                            cancer in a first-degree relative Providers:                Willis Modena, MD, Stephens Shire RN, RN, Rozetta Nunnery, Technician, Derinda Late, Technician Referring MD:              Medicines:                Monitored Anesthesia Care Complications:            No immediate complications. Estimated Blood Loss:     Estimated blood loss: none. Procedure:                Pre-Anesthesia Assessment:                           - Prior to the procedure, a History and Physical                            was performed, and patient medications and                            allergies were reviewed. The patient's tolerance of                            previous anesthesia was also reviewed. The risks                            and benefits of the procedure and the sedation                            options and risks were discussed with the patient.                            All questions were answered, and informed consent                            was obtained. Prior Anticoagulants: The patient has                            taken no anticoagulant or antiplatelet agents. ASA  Grade Assessment: III - A patient with severe                            systemic disease. After reviewing the risks and                            benefits, the patient was deemed in satisfactory                            condition to undergo the procedure.                           After obtaining informed consent, the colonoscope                             was passed under direct vision. Throughout the                            procedure, the patient's blood pressure, pulse, and                            oxygen saturations were monitored continuously. The                            [id number]-HQ190L ([id number]) Olympus colonoscope was                            introduced through the anus and advanced to the the                            cecum, identified by appendiceal orifice and                            ileocecal valve. The ileocecal valve, appendiceal                            orifice, and rectum were photographed. The entire                            colon was examined. The colonoscopy was performed                            without difficulty. The patient tolerated the                            procedure well. The quality of the bowel                            preparation was adequate. Scope In: 9:29:44 AM Scope Out: 9:45:04 AM Scope Withdrawal Time: 0 hours 7 minutes 49 seconds  Total Procedure Duration: 0 hours 15 minutes 20 seconds  Findings:      Hemorrhoids were found on perianal exam.      A diffuse area of mild melanosis was found in the entire colon.  The exam was otherwise without abnormality on direct and retroflexion       views. Impression:               - Hemorrhoids found on perianal exam.                           - Melanosis in the colon.                           - The examination was otherwise normal on direct                            and retroflexion views.                           - No specimens collected. Moderate Sedation:      Not Applicable - Patient had care per Anesthesia. Recommendation:           - Patient has a contact number available for                            emergencies. The signs and symptoms of potential                            delayed complications were discussed with the                            patient. Return to normal activities tomorrow.                             Written discharge instructions were provided to the                            patient.                           - Discharge patient to home (via wheelchair).                           - Resume previous diet today.                           - Continue present medications.                           - Repeat colonoscopy in 5 years for surveillance.                           - Return to GI clinic PRN.                           - Return to referring physician as previously                            scheduled. Procedure Code(s):        --- Professional ---  40981, Colonoscopy, flexible; diagnostic, including                            collection of specimen(s) by brushing or washing,                            when performed (separate procedure) Diagnosis Code(s):        --- Professional ---                           Z86.010, Personal history of colonic polyps                           K63.89, Other specified diseases of intestine                           K64.9, Unspecified hemorrhoids                           Z80.0, Family history of malignant neoplasm of                            digestive organs CPT copyright 2022 [employer]. All rights reserved. The codes documented in this report are preliminary and upon coder review may  be revised to meet current compliance requirements. Willis Modena, MD 08/07/2023 10:00:53 AM This report has been signed electronically. Number of Addenda: 0

## 2023-08-09 ENCOUNTER — Encounter (HOSPITAL_COMMUNITY): Payer: Self-pay | Admitting: Gastroenterology

## 2023-08-15 ENCOUNTER — Ambulatory Visit: Payer: Self-pay | Admitting: Cardiology

## 2023-09-06 ENCOUNTER — Ambulatory Visit: Payer: 59 | Attending: Cardiology | Admitting: Cardiology

## 2023-09-06 ENCOUNTER — Encounter: Payer: Self-pay | Admitting: Cardiology

## 2023-09-06 VITALS — BP 144/82 | HR 66 | Ht 63.0 in | Wt 179.8 lb

## 2023-09-06 DIAGNOSIS — I50812 Chronic right heart failure: Secondary | ICD-10-CM | POA: Diagnosis not present

## 2023-09-06 DIAGNOSIS — I2510 Atherosclerotic heart disease of native coronary artery without angina pectoris: Secondary | ICD-10-CM

## 2023-09-06 DIAGNOSIS — I442 Atrioventricular block, complete: Secondary | ICD-10-CM | POA: Diagnosis not present

## 2023-09-06 DIAGNOSIS — I480 Paroxysmal atrial fibrillation: Secondary | ICD-10-CM | POA: Diagnosis not present

## 2023-09-06 MED ORDER — ASPIRIN 81 MG PO TBEC
81.0000 mg | DELAYED_RELEASE_TABLET | Freq: Every day | ORAL | 11 refills | Status: AC
Start: 2023-09-06 — End: ?

## 2023-09-06 NOTE — Patient Instructions (Signed)
Medication Instructions:   START TAKING ASPIRIN 81 MG BY MOUTH DAILY  *If you need a refill on your cardiac medications before your next appointment, please call your pharmacy*    Follow-Up: At [facility], you and your health needs are our priority.  As part of our continuing mission to provide you with exceptional heart care, we have created designated Provider Care Teams.  These Care Teams include your primary Cardiologist (physician) and Advanced Practice Providers (APPs -  Physician Assistants and Nurse Practitioners) who all work together to provide you with the care you need, when you need it.  We recommend signing up for the patient portal called "MyChart".  Sign up information is provided on this After Visit Summary.  MyChart is used to connect with patients for Virtual Visits (Telemedicine).  Patients are able to view lab/test results, encounter notes, upcoming appointments, etc.  Non-urgent messages can be sent to your provider as well.   To learn more about what you can do with MyChart, go to [email or web address].    Your next appointment:   1 year(s)  Provider:   Elder Negus, MD

## 2023-09-06 NOTE — Progress Notes (Signed)
Cardiology Office Note:  .   Date:  09/06/2023  ID:  Colleen [name], DOB Mar 27, 1966, MRN [id number] PCP: Joycelyn Rua, MD  [facility] Providers Cardiologist:  Truett Mainland, MD PCP: Joycelyn Rua, MD  Chief Complaint  Patient presents with   Coronary Artery Disease      History of Present Illness: Colleen [name] is a 57 y.o. female with hypertension, type 1 diabetes mellitus, CAD s/p CABG (LIMA-LAD, SVG-RCA, SVG-OM), and Maze, PAF s/p Afib ablation, RV failure-now improved, CKD IIIb  Patient is doing very well.  She has regular follow-up with endocrinology.  She has lost several pounds on Ozempic, and tolerating well.  She continues to have issues with minor bleeding with her insulin pump.  She is now off Eliquis as per EP recommendations, since A-fib ablation.  She is also not taking aspirin at this time.  Blood pressure is generally well-controlled, slightly elevated today  Overall, patient states she feels "better than I deserve".    Vitals:   09/06/23 0847  BP: (!) 144/82  Pulse: 66  SpO2: 99%     ROS:  Review of Systems  Cardiovascular:  Negative for chest pain, dyspnea on exertion, leg swelling, palpitations and syncope.     Studies Reviewed: Marland Kitchen        Independently interpreted 06/2023: Cr 1.68.  HbA1C 7.5%   Physical Exam:   Physical Exam Vitals and nursing note reviewed.  Constitutional:      General: She is not in acute distress. Neck:     Vascular: No JVD.  Cardiovascular:     Rate and Rhythm: Normal rate and regular rhythm.     Heart sounds: Normal heart sounds. No murmur heard. Pulmonary:     Effort: Pulmonary effort is normal.     Breath sounds: Normal breath sounds. No wheezing or rales.  Musculoskeletal:     Right lower leg: No edema.     Left lower leg: No edema.      VISIT DIAGNOSES: No diagnosis found.   ASSESSMENT AND PLAN: .    Colleen [name] is a 57 y.o. female with hypertension, type 1 diabetes  mellitus, CAD with RCA and Lcx CTOs, late presentation inferior MI (11/2020), complicated by complete AV block and AKI/CKD, RV failure, resolution of AV block and AKI on discharge, followed by repeat admission with asystole cardiac arrest (12/2020), now s/p PPM, s/p CABGX3 (LIMA-LAD, SVG-RCA, SVG-OM), LAA clip, Maze [facility] (11/2021), complicated by sternal incision infection, subsequent AI while on Entresto.   CAD, ischemic cardiomyopathy: s/p CABGX3 (LIMA-LAD, SVG-RCA, SVG-OM), LAA clip, Maze at [facility] (11/2021) Significant improvement in symptoms on GDMT. Continue Entresto 24-26 mg bid, metoprolol succinate to 25 mg daily. Continue statin. Now the patient has off Eliquis, strongly recommend aspirin 81 mg daily. Patient is concerned about bleeding occurring around the time of insulin pump change.  It could be reasonable to hold aspirin for 2-3 days before insulin pump change that occurs weekly, but I would feel more comfortable with her taking aspirin at least few days a week then not at all. Check lipid panel when she is here for EP appt next week.   Persistent A-fib: S/p LAA ligation, A-fib/flutter ablation 11/2022. Off Eliquis with no recurrence of A-fib.   She has appointment with Dr. Lalla Brothers next week, where discontinuation of amiodarone could be discussed.   Complete AV block: Normal functioning pacemaker. Has follow up with EP.   CKD stage 3: She has  not tolerated Entresto due to AKI. Continue close monitoring of renal function with PCP/nephrology  Type 1 DM: Continue insulin pump.  Follow-up with Dr. Talmage Nap         No orders of the defined types were placed in this encounter.    F/u in 1 year  Signed, Elder Negus, MD

## 2023-09-06 NOTE — Addendum Note (Signed)
Addended by: Loa Socks on: 09/06/2023 03:06 PM   Modules accepted: Orders

## 2023-09-07 ENCOUNTER — Encounter: Payer: Self-pay | Admitting: *Deleted

## 2023-09-07 ENCOUNTER — Encounter: Payer: Self-pay | Admitting: Cardiology

## 2023-09-09 NOTE — Progress Notes (Unsigned)
Cardiology Office Note:  .   Date:  09/09/2023  ID:  Colleen [name], DOB 1966/01/06, MRN [id number] PCP: Joycelyn Rua, MD  [facility] Providers Cardiologist:  Elder Negus, MD Electrophysiologist:  Lanier Prude, MD {  History of Present Illness: Colleen [name] is a 57 y.o. female w/PMHx of CAD (CABG (LIMA-LAD, SVG-RCA, SVG-OM, PVI ablation (?MAZE), LAA ligation 12/04/21) AFlutter/ ATach, CHB w/PPM, Afib, AFlutter, ATach, CKD (IIIb), HTN, HLD, Type I DM  She saw Dr. Lalla Brothers 03/02/23, doing well, no recurrent A arrhythmias.  Discussed repeat ablation would be an option if amiodarone started to fail.  With hx of LAA ligation and no recurrent AFib or flutter, arrhythmia >> Eliquis stopped  Amio was reduced to 100mg  daily Plans for 6 mo visit >> if no arrhythmia > consider stopping amio  She saw Dr. Rosemary Holms 09/06/23, advise start of ASA, was pending exchange of his insulin pump. No changes were made yto his med   Today's visit is scheduled as a 6 mo visit  ROS:   She is doing really well No CP, has what sounds a chronic back/Lsub-scapular pain she says has been discussed at length and evaluated via Dr. Spero Curb No palpitations No SOB, DOE No near syncope or syncope.    Device information Abbott dual chamber PPM implanted 01/06/21  Arrhythmia/AAD hx 12/19/22: Flutter ablation  Dr. Lalla Brothers noted: She also had an atrial tachycardia that localized to the high right atrium near the RA lead insertion site. I opted to not ablate that at the initial procedure given the concern that it would render the right atrial lead nonfunctional. She was started on amiodarone   Studies Reviewed: Marland Kitchen    EKG not done today  DEVICE interrogation done today and reviewed by myself Battery and lead measurements are good No AF/Flutter/AMS No VT AP 72% VP >99% She is device dependent  12/19/22: TEE 1. Left ventricular ejection fraction, by estimation, is 40 to  45%. The  left ventricle has mildly decreased function.   2. Right ventricular systolic function is mildly reduced. The right  ventricular size is normal.   3. Left atrial appendage is clipped with no residual leak or thrombus. .  Left atrial size was mild to moderately dilated. No left atrial/left  atrial appendage thrombus was detected.   4. Right atrial size was mildly dilated.   5. The mitral valve is normal in structure. Trivial mitral valve  regurgitation.   6. Tricuspid valve regurgitation is moderate to severe.   7. The aortic valve is grossly normal. There is moderate calcification of  the aortic valve. Aortic valve regurgitation is not visualized.    Echocardiogram 04/05/2022:  Left ventricle cavity is normal in size. Mild concentric hypertrophy of  the left ventricle. Abnormal septal wall motion due to post-operative  coronary artery bypass graft. Mild global hypokinesis. LVEF 50-55%.  Doppler evidence of grade II (pseudonormal) diastolic dysfunction,  elevated LAP.  Right ventricle cavity not well visualized, but at least mildly dilated  with at least mildly reduced systolic function.  Device lead is seen.  Left atrial cavity is mildly dilated.  Right atrial cavity is mild to moderately dilated.  Mild (Grade I) mitral regurgitation.  Moderate tricuspid regurgitation. Estimated pulmonary artery systolic  pressure at least 26 mmHg.  IVC is dilated with a respiratory response of <50%. Estimated right atrial  pressure at least 15 mmHg.  Compared to previous study on 02/25/2021, LVEF has improved from 35-40%.  Risk Assessment/Calculations:    Physical Exam:   VS:  There were no vitals taken for this visit.   Wt Readings from Last 3 Encounters:  09/06/23 179 lb 12.8 oz (81.6 kg)  08/07/23 180 lb (81.6 kg)  03/02/23 196 lb 12.8 oz (89.3 kg)    GEN: Well nourished, well developed in no acute distress NECK: No JVD; No carotid bruits CARDIAC: RRR, no murmurs, rubs,  gallops RESPIRATORY:  CTA b/l without rales, wheezing or rhonchi  ABDOMEN: Soft, non-tender, non-distended EXTREMITIES:  she has fused L ankle with chronic skin changes and edema/larger then her R, no pitting edema; No deformity   PPM site: is stable, no thinning, fluctuation, tethering  ASSESSMENT AND PLAN: .    paroxysmal AFib AFlutter Hx of CTI ablation ATach CHA2DS2Vasc is 4 OFF OAC with no arrhythmia burden by device and hx of LAA ligation/clip with no residual leak confirmed by TEE  No arrhythmias since June >> will stop amiodarone  CAD No anginal sounding symptoms C/w Dr. Spero Curb  HTN Looks good    Disposition:   Signed, Sheilah Pigeon, PA-C

## 2023-09-11 ENCOUNTER — Encounter: Payer: Self-pay | Admitting: Physician Assistant

## 2023-09-11 ENCOUNTER — Ambulatory Visit: Payer: 59 | Attending: Physician Assistant | Admitting: Physician Assistant

## 2023-09-11 ENCOUNTER — Encounter: Payer: 59 | Admitting: Cardiology

## 2023-09-11 VITALS — BP 114/72 | HR 74 | Ht 63.0 in | Wt 183.0 lb

## 2023-09-11 DIAGNOSIS — I480 Paroxysmal atrial fibrillation: Secondary | ICD-10-CM

## 2023-09-11 DIAGNOSIS — I4892 Unspecified atrial flutter: Secondary | ICD-10-CM

## 2023-09-11 DIAGNOSIS — I2510 Atherosclerotic heart disease of native coronary artery without angina pectoris: Secondary | ICD-10-CM | POA: Diagnosis not present

## 2023-09-11 DIAGNOSIS — Z950 Presence of cardiac pacemaker: Secondary | ICD-10-CM | POA: Diagnosis not present

## 2023-09-11 DIAGNOSIS — I10 Essential (primary) hypertension: Secondary | ICD-10-CM

## 2023-09-11 LAB — CUP PACEART INCLINIC DEVICE CHECK
Battery Remaining Longevity: 50 mo
Battery Voltage: 2.98 V
Brady Statistic RA Percent Paced: 72 %
Brady Statistic RV Percent Paced: 99.99 %
Date Time Interrogation Session: 20241217174102
Implantable Lead Connection Status: 753985
Implantable Lead Connection Status: 753985
Implantable Lead Implant Date: 20220414
Implantable Lead Implant Date: 20220414
Implantable Lead Location: 753859
Implantable Lead Location: 753860
Implantable Lead Model: 1944
Implantable Pulse Generator Implant Date: 20220414
Lead Channel Impedance Value: 475 Ohm
Lead Channel Impedance Value: 575 Ohm
Lead Channel Pacing Threshold Amplitude: 1.25 V
Lead Channel Pacing Threshold Amplitude: 1.25 V
Lead Channel Pacing Threshold Amplitude: 1.5 V
Lead Channel Pacing Threshold Amplitude: 1.5 V
Lead Channel Pacing Threshold Pulse Width: 0.5 ms
Lead Channel Pacing Threshold Pulse Width: 0.5 ms
Lead Channel Pacing Threshold Pulse Width: 0.8 ms
Lead Channel Pacing Threshold Pulse Width: 0.8 ms
Lead Channel Sensing Intrinsic Amplitude: 1.7 mV
Lead Channel Sensing Intrinsic Amplitude: 12 mV
Lead Channel Setting Pacing Amplitude: 1.375
Lead Channel Setting Pacing Amplitude: 3 V
Lead Channel Setting Pacing Pulse Width: 0.5 ms
Lead Channel Setting Sensing Sensitivity: 2 mV
Pulse Gen Model: 2272
Pulse Gen Serial Number: 3920245

## 2023-09-11 NOTE — Patient Instructions (Signed)
Medication Instructions:    STOP TAKING AND REMOVE THIS MEDICATION FROM YOUR MEDICATION LIST:  AMIODARONE   *If you need a refill on your cardiac medications before your next appointment, please call your pharmacy*   Lab Work:  NONE ORDERED  TODAY   If you have labs (blood work) drawn today and your tests are completely normal, you will receive your results only by: MyChart Message (if you have MyChart) OR A paper copy in the mail If you have any lab test that is abnormal or we need to change your treatment, we will call you to review the results.   Testing/Procedures: NONE ORDERED  TODAY    Follow-Up: At [facility], you and your health needs are our priority.  As part of our continuing mission to provide you with exceptional heart care, we have created designated Provider Care Teams.  These Care Teams include your primary Cardiologist (physician) and Advanced Practice Providers (APPs -  Physician Assistants and Nurse Practitioners) who all work together to provide you with the care you need, when you need it.  We recommend signing up for the patient portal called "MyChart".  Sign up information is provided on this After Visit Summary.  MyChart is used to connect with patients for Virtual Visits (Telemedicine).  Patients are able to view lab/test results, encounter notes, upcoming appointments, etc.  Non-urgent messages can be sent to your provider as well.   To learn more about what you can do with MyChart, go to [email or web address].    Your next appointment:   4 month(s) ( CONTACT  CASSIE HALL/ ANGELINE HAMMER FOR EP SCHEDULING ISSUES )   Provider:   Steffanie Dunn, MD or Francis Dowse, PA-C    Other Instructions

## 2023-09-12 LAB — LIPID PANEL
Chol/HDL Ratio: 1.6 {ratio} (ref 0.0–4.4)
Cholesterol, Total: 142 mg/dL (ref 100–199)
HDL: 88 mg/dL (ref >39)
LDL Chol Calc (NIH): 44 mg/dL (ref 0–99)
Triglycerides: 40 mg/dL (ref 0–149)
VLDL Cholesterol Cal: 10 mg/dL (ref 5–40)

## 2023-10-05 ENCOUNTER — Ambulatory Visit: Payer: 59

## 2023-10-05 DIAGNOSIS — I442 Atrioventricular block, complete: Secondary | ICD-10-CM | POA: Diagnosis not present

## 2023-10-06 LAB — CUP PACEART REMOTE DEVICE CHECK
Battery Remaining Longevity: 50 mo
Battery Remaining Percentage: 68 %
Battery Voltage: 2.98 V
Brady Statistic AP VP Percent: 71 %
Brady Statistic AP VS Percent: 1 %
Brady Statistic AS VP Percent: 29 %
Brady Statistic AS VS Percent: 1 %
Brady Statistic RA Percent Paced: 70 %
Brady Statistic RV Percent Paced: 99 %
Date Time Interrogation Session: 20250110020015
Implantable Lead Connection Status: 753985
Implantable Lead Connection Status: 753985
Implantable Lead Implant Date: 20220414
Implantable Lead Implant Date: 20220414
Implantable Lead Location: 753859
Implantable Lead Location: 753860
Implantable Lead Model: 1944
Implantable Pulse Generator Implant Date: 20220414
Lead Channel Impedance Value: 510 Ohm
Lead Channel Impedance Value: 580 Ohm
Lead Channel Pacing Threshold Amplitude: 1 V
Lead Channel Pacing Threshold Amplitude: 1.5 V
Lead Channel Pacing Threshold Pulse Width: 0.5 ms
Lead Channel Pacing Threshold Pulse Width: 0.8 ms
Lead Channel Sensing Intrinsic Amplitude: 1.4 mV
Lead Channel Sensing Intrinsic Amplitude: 12 mV
Lead Channel Setting Pacing Amplitude: 1.25 V
Lead Channel Setting Pacing Amplitude: 3 V
Lead Channel Setting Pacing Pulse Width: 0.5 ms
Lead Channel Setting Sensing Sensitivity: 2 mV
Pulse Gen Model: 2272
Pulse Gen Serial Number: 3920245

## 2023-10-18 ENCOUNTER — Other Ambulatory Visit (HOSPITAL_BASED_OUTPATIENT_CLINIC_OR_DEPARTMENT_OTHER): Payer: Self-pay | Admitting: Family Medicine

## 2023-10-18 DIAGNOSIS — Z1231 Encounter for screening mammogram for malignant neoplasm of breast: Secondary | ICD-10-CM

## 2023-10-22 ENCOUNTER — Encounter (HOSPITAL_BASED_OUTPATIENT_CLINIC_OR_DEPARTMENT_OTHER): Payer: Self-pay

## 2023-10-22 ENCOUNTER — Ambulatory Visit (HOSPITAL_BASED_OUTPATIENT_CLINIC_OR_DEPARTMENT_OTHER)
Admission: RE | Admit: 2023-10-22 | Discharge: 2023-10-22 | Disposition: A | Discharge status: Home / Self Care | Payer: 59 | Source: Ambulatory Visit | Attending: Family Medicine | Admitting: Family Medicine

## 2023-10-22 DIAGNOSIS — Z1231 Encounter for screening mammogram for malignant neoplasm of breast: Secondary | ICD-10-CM | POA: Diagnosis present

## 2023-10-24 ENCOUNTER — Encounter: Payer: Self-pay | Admitting: Family Medicine

## 2023-10-24 ENCOUNTER — Other Ambulatory Visit: Payer: Self-pay | Admitting: Family Medicine

## 2023-10-24 DIAGNOSIS — R928 Other abnormal and inconclusive findings on diagnostic imaging of breast: Secondary | ICD-10-CM

## 2023-11-07 ENCOUNTER — Ambulatory Visit
Admission: RE | Admit: 2023-11-07 | Discharge: 2023-11-07 | Disposition: A | Discharge status: Home / Self Care | Payer: 59 | Source: Ambulatory Visit | Attending: Family Medicine | Admitting: Family Medicine

## 2023-11-07 DIAGNOSIS — R928 Other abnormal and inconclusive findings on diagnostic imaging of breast: Secondary | ICD-10-CM

## 2023-11-09 NOTE — Addendum Note (Signed)
Addended by: Elease Etienne A on: 11/09/2023 12:37 PM   Modules accepted: Orders

## 2023-11-09 NOTE — Progress Notes (Signed)
Remote pacemaker transmission.

## 2023-12-25 ENCOUNTER — Other Ambulatory Visit: Payer: Self-pay | Admitting: Cardiology

## 2024-01-02 NOTE — Progress Notes (Unsigned)
 Cardiology Office Note:  .   Date:  01/02/2024  ID:  Eugenio Hoes [name], DOB Nov 15, 1965, MRN [id number] PCP: Joycelyn Rua, MD  [facility] Providers Cardiologist:  Elder Negus, MD Electrophysiologist:  Lanier Prude, MD {  History of Present Illness: Colleen [name] is a 58 y.o. female w/PMHx of CAD (CABG (LIMA-LAD, SVG-RCA, SVG-OM, PVI ablation (?MAZE), LAA ligation 12/04/21) AFlutter/ ATach, CHB w/PPM, Afib, AFlutter, ATach, CKD (IIIb), HTN, HLD, Type I DM  She saw Dr. Lalla Brothers 03/02/23, doing well, no recurrent A arrhythmias.  Discussed repeat ablation would be an option if amiodarone started to fail.  With hx of LAA ligation and no recurrent AFib or flutter, arrhythmia >> Eliquis stopped  Amio was reduced to 100mg  daily Plans for 6 mo visit >> if no arrhythmia > consider stopping amio  She saw Dr. Rosemary Holms 09/06/23, advise start of ASA, was pending exchange of his insulin pump. No changes were made yto his med  I saw her 09/11/23: She is doing really well No CP, has what sounds a chronic back/Lsub-scapular pain she says has been discussed at length and evaluated via Dr. Spero Curb No palpitations No SOB, DOE No near syncope or syncope. No arrhythmias noted > amiodarone stopped  Saw Dr. Windy Fast, cardiology at [facility] 10/10/23, again doing well, no changes were made  Today's visit is scheduled as a 4 mo visit (off amio) ROS:   *** Afib? Arrhythmias? *** symptoms *** dependent  Device information Abbott dual chamber PPM implanted 01/06/21  Arrhythmia/AAD hx 12/19/22: Flutter ablation  Dr. Lalla Brothers noted: She also had an atrial tachycardia that localized to the high right atrium near the RA lead insertion site. I opted to not ablate that at the initial procedure given the concern that it would render the right atrial lead nonfunctional. She was started on amiodarone  Amiodarone stopped  Dec 2024 with no recurrent arrhythmias  Studies Reviewed: Marland Kitchen     EKG not done today  DEVICE interrogation done today and reviewed by myself *** Battery and lead measurements are good *** She is device dependent  12/19/22: TEE 1. Left ventricular ejection fraction, by estimation, is 40 to 45%. The  left ventricle has mildly decreased function.   2. Right ventricular systolic function is mildly reduced. The right  ventricular size is normal.   3. Left atrial appendage is clipped with no residual leak or thrombus. .  Left atrial size was mild to moderately dilated. No left atrial/left  atrial appendage thrombus was detected.   4. Right atrial size was mildly dilated.   5. The mitral valve is normal in structure. Trivial mitral valve  regurgitation.   6. Tricuspid valve regurgitation is moderate to severe.   7. The aortic valve is grossly normal. There is moderate calcification of  the aortic valve. Aortic valve regurgitation is not visualized.    Echocardiogram 04/05/2022:  Left ventricle cavity is normal in size. Mild concentric hypertrophy of  the left ventricle. Abnormal septal wall motion due to post-operative  coronary artery bypass graft. Mild global hypokinesis. LVEF 50-55%.  Doppler evidence of grade II (pseudonormal) diastolic dysfunction,  elevated LAP.  Right ventricle cavity not well visualized, but at least mildly dilated  with at least mildly reduced systolic function.  Device lead is seen.  Left atrial cavity is mildly dilated.  Right atrial cavity is mild to moderately dilated.  Mild (Grade I) mitral regurgitation.  Moderate tricuspid regurgitation. Estimated pulmonary artery systolic  pressure at  least 26 mmHg.  IVC is dilated with a respiratory response of <50%. Estimated right atrial  pressure at least 15 mmHg.  Compared to previous study on 02/25/2021, LVEF has improved from 35-40%.    Risk Assessment/Calculations:    Physical Exam:   VS:  There were no vitals taken for this visit.   Wt Readings from Last 3  Encounters:  09/11/23 183 lb (83 kg)  09/06/23 179 lb 12.8 oz (81.6 kg)  08/07/23 180 lb (81.6 kg)    GEN: Well nourished, well developed in no acute distress NECK: No JVD; No carotid bruits CARDIAC: *** RRR, no murmurs, rubs, gallops RESPIRATORY: *** CTA b/l without rales, wheezing or rhonchi  ABDOMEN: Soft, non-tender, non-distended EXTREMITIES: *** she has fused L ankle with chronic skin changes and edema/larger then her R, no pitting edema; No deformity   *** PPM site: is stable, no thinning, fluctuation, tethering  ASSESSMENT AND PLAN: .    paroxysmal AFib AFlutter Hx of CTI ablation ATach CHA2DS2Vasc is 4 OFF OAC with no arrhythmia burden by device and hx of LAA ligation/clip with no residual leak confirmed by TEE  ***  CAD *** No anginal sounding symptoms C/w Dr. Spero Curb  HTN *** Looks good  6.   PPM *** intact function *** no programming changes made  Disposition: ***  Signed, Sheilah Pigeon, PA-C

## 2024-01-03 ENCOUNTER — Ambulatory Visit: Payer: 59 | Attending: Physician Assistant | Admitting: Physician Assistant

## 2024-01-03 ENCOUNTER — Encounter: Payer: Self-pay | Admitting: Physician Assistant

## 2024-01-03 VITALS — BP 108/66 | HR 72 | Ht 63.0 in | Wt 181.4 lb

## 2024-01-03 DIAGNOSIS — I4719 Other supraventricular tachycardia: Secondary | ICD-10-CM

## 2024-01-03 DIAGNOSIS — I480 Paroxysmal atrial fibrillation: Secondary | ICD-10-CM | POA: Diagnosis not present

## 2024-01-03 DIAGNOSIS — Z950 Presence of cardiac pacemaker: Secondary | ICD-10-CM

## 2024-01-03 DIAGNOSIS — I420 Dilated cardiomyopathy: Secondary | ICD-10-CM

## 2024-01-03 DIAGNOSIS — I10 Essential (primary) hypertension: Secondary | ICD-10-CM

## 2024-01-03 DIAGNOSIS — I2510 Atherosclerotic heart disease of native coronary artery without angina pectoris: Secondary | ICD-10-CM

## 2024-01-03 DIAGNOSIS — I428 Other cardiomyopathies: Secondary | ICD-10-CM | POA: Diagnosis not present

## 2024-01-03 LAB — CUP PACEART INCLINIC DEVICE CHECK
Battery Remaining Longevity: 56 mo
Battery Voltage: 2.99 V
Brady Statistic RA Percent Paced: 38 %
Brady Statistic RV Percent Paced: 99.98 %
Date Time Interrogation Session: 20250410122740
Implantable Lead Connection Status: 753985
Implantable Lead Connection Status: 753985
Implantable Lead Implant Date: 20220414
Implantable Lead Implant Date: 20220414
Implantable Lead Location: 753859
Implantable Lead Location: 753860
Implantable Lead Model: 1944
Implantable Pulse Generator Implant Date: 20220414
Lead Channel Impedance Value: 475 Ohm
Lead Channel Impedance Value: 575 Ohm
Lead Channel Pacing Threshold Amplitude: 1.125 V
Lead Channel Pacing Threshold Amplitude: 1.25 V
Lead Channel Pacing Threshold Amplitude: 1.25 V
Lead Channel Pacing Threshold Pulse Width: 0.5 ms
Lead Channel Pacing Threshold Pulse Width: 0.8 ms
Lead Channel Pacing Threshold Pulse Width: 0.8 ms
Lead Channel Sensing Intrinsic Amplitude: 12 mV
Lead Channel Sensing Intrinsic Amplitude: 2 mV
Lead Channel Setting Pacing Amplitude: 1.375
Lead Channel Setting Pacing Amplitude: 3 V
Lead Channel Setting Pacing Pulse Width: 0.5 ms
Lead Channel Setting Sensing Sensitivity: 2 mV
Pulse Gen Model: 2272
Pulse Gen Serial Number: 3920245

## 2024-01-03 MED ORDER — LOSARTAN POTASSIUM 25 MG PO TABS: 12.5000 mg | ORAL_TABLET | Freq: Every day | ORAL | 3 refills | Status: DC

## 2024-01-03 NOTE — Patient Instructions (Addendum)
 Medication Instructions:   START TAKING : LOSARTAN  ( HALF OF 25 MG TABLET )  12.5 MG ONCE  A DAY  STOP TAKING :  ENTRESTO  *If you need a refill on your cardiac medications before your next appointment, please call your pharmacy*  Lab Work: NONE ORDERED  TODAY   If you have labs (blood work) drawn today and your tests are completely normal, you will receive your results only by: MyChart Message (if you have MyChart) OR A paper copy in the mail If you have any lab test that is abnormal or we need to change your treatment, we will call you to review the results.   Testing/Procedures: Your physician has requested that you have an echocardiogram. Echocardiography is a painless test that uses sound waves to create images of your heart. It provides your doctor with information about the size and shape of your heart and how well your heart's chambers and valves are working. This procedure takes approximately one hour. There are no restrictions for this procedure. Please do NOT wear cologne, perfume, aftershave, or lotions (deodorant is allowed). Please arrive 15 minutes prior to your appointment time.  Please note: We ask at that you not bring children with you during ultrasound (echo/ vascular) testing. Due to room size and safety concerns, children are not allowed in the ultrasound rooms during exams. Our front office staff cannot provide observation of children in our lobby area while testing is being conducted. An adult accompanying a patient to their appointment will only be allowed in the ultrasound room at the discretion of the ultrasound technician under special circumstances. We apologize for any inconvenience.    Follow-Up: At [facility], you and your health needs are our priority.  As part of our continuing mission to provide you with exceptional heart care, our providers are all part of one team.  This team includes your primary Cardiologist (physician) and Advanced  Practice Providers or APPs (Physician Assistants and Nurse Practitioners) who all work together to provide you with the care you need, when you need it.  Your next appointment:    3 month(s)  Provider:    Elder Negus, MD    DR Lalla Brothers / Darla Lesches  IN ONE YEAR    We recommend signing up for the patient portal called "MyChart".  Sign up information is provided on this After Visit Summary.  MyChart is used to connect with patients for Virtual Visits (Telemedicine).  Patients are able to view lab/test results, encounter notes, upcoming appointments, etc.  Non-urgent messages can be sent to your provider as well.   To learn more about what you can do with MyChart, go to [email or web address].   Other Instructions       1st Floor: - Lobby - Registration  - Pharmacy  - Lab - Cafe  2nd Floor: - PV Lab - Diagnostic Testing (echo, CT, nuclear med)  3rd Floor: - Vacant  4th Floor: - TCTS (cardiothoracic surgery) - AFib Clinic - Structural Heart Clinic - Vascular Surgery  - Vascular Ultrasound  5th Floor: - HeartCare Cardiology (general and EP) - Clinical Pharmacy for coumadin, hypertension, lipid, weight-loss medications, and med management appointments    Valet parking services will be available as well.

## 2024-01-04 ENCOUNTER — Ambulatory Visit (INDEPENDENT_AMBULATORY_CARE_PROVIDER_SITE_OTHER): Payer: 59

## 2024-01-04 DIAGNOSIS — I420 Dilated cardiomyopathy: Secondary | ICD-10-CM | POA: Diagnosis not present

## 2024-01-05 ENCOUNTER — Encounter: Payer: Self-pay | Admitting: Cardiology

## 2024-01-05 LAB — CUP PACEART REMOTE DEVICE CHECK
Battery Remaining Longevity: 55 mo
Battery Remaining Percentage: 64 %
Battery Voltage: 2.99 V
Brady Statistic AP VP Percent: 26 %
Brady Statistic AP VS Percent: 0 %
Brady Statistic AS VP Percent: 74 %
Brady Statistic AS VS Percent: 1 %
Brady Statistic RA Percent Paced: 25 %
Brady Statistic RV Percent Paced: 99 %
Date Time Interrogation Session: 20250411020018
Implantable Lead Connection Status: 753985
Implantable Lead Connection Status: 753985
Implantable Lead Implant Date: 20220414
Implantable Lead Implant Date: 20220414
Implantable Lead Location: 753859
Implantable Lead Location: 753860
Implantable Lead Model: 1944
Implantable Pulse Generator Implant Date: 20220414
Lead Channel Impedance Value: 480 Ohm
Lead Channel Impedance Value: 560 Ohm
Lead Channel Pacing Threshold Amplitude: 1.125 V
Lead Channel Pacing Threshold Amplitude: 1.25 V
Lead Channel Pacing Threshold Pulse Width: 0.5 ms
Lead Channel Pacing Threshold Pulse Width: 0.8 ms
Lead Channel Sensing Intrinsic Amplitude: 12 mV
Lead Channel Sensing Intrinsic Amplitude: 2 mV
Lead Channel Setting Pacing Amplitude: 1.375
Lead Channel Setting Pacing Amplitude: 3 V
Lead Channel Setting Pacing Pulse Width: 0.5 ms
Lead Channel Setting Sensing Sensitivity: 2 mV
Pulse Gen Model: 2272
Pulse Gen Serial Number: 3920245

## 2024-02-06 ENCOUNTER — Ambulatory Visit (HOSPITAL_COMMUNITY): Attending: Cardiology

## 2024-02-06 DIAGNOSIS — I428 Other cardiomyopathies: Secondary | ICD-10-CM

## 2024-02-06 LAB — ECHOCARDIOGRAM COMPLETE: S' Lateral: 3.8 cm

## 2024-02-07 NOTE — Addendum Note (Signed)
 Addended by: Lott Rouleau A on: 02/07/2024 11:33 AM   Modules accepted: Orders

## 2024-02-07 NOTE — Progress Notes (Signed)
 Remote pacemaker transmission.

## 2024-02-12 ENCOUNTER — Ambulatory Visit: Payer: Self-pay | Admitting: Physician Assistant

## 2024-03-31 ENCOUNTER — Encounter: Payer: Self-pay | Admitting: Cardiology

## 2024-04-04 ENCOUNTER — Ambulatory Visit

## 2024-04-04 DIAGNOSIS — I420 Dilated cardiomyopathy: Secondary | ICD-10-CM | POA: Diagnosis not present

## 2024-04-04 LAB — CUP PACEART REMOTE DEVICE CHECK
Battery Remaining Longevity: 54 mo
Battery Remaining Percentage: 62 %
Battery Voltage: 2.99 V
Brady Statistic AP VP Percent: 8.7 %
Brady Statistic AP VS Percent: 1 %
Brady Statistic AS VP Percent: 91 %
Brady Statistic AS VS Percent: 1 %
Brady Statistic RA Percent Paced: 8.6 %
Brady Statistic RV Percent Paced: 99 %
Date Time Interrogation Session: 20250711020013
Implantable Lead Connection Status: 753985
Implantable Lead Connection Status: 753985
Implantable Lead Implant Date: 20220414
Implantable Lead Implant Date: 20220414
Implantable Lead Location: 753859
Implantable Lead Location: 753860
Implantable Lead Model: 1944
Implantable Pulse Generator Implant Date: 20220414
Lead Channel Impedance Value: 440 Ohm
Lead Channel Impedance Value: 550 Ohm
Lead Channel Pacing Threshold Amplitude: 1.25 V
Lead Channel Pacing Threshold Amplitude: 1.25 V
Lead Channel Pacing Threshold Pulse Width: 0.5 ms
Lead Channel Pacing Threshold Pulse Width: 0.8 ms
Lead Channel Sensing Intrinsic Amplitude: 12 mV
Lead Channel Sensing Intrinsic Amplitude: 2.5 mV
Lead Channel Setting Pacing Amplitude: 1.5 V
Lead Channel Setting Pacing Amplitude: 3 V
Lead Channel Setting Pacing Pulse Width: 0.5 ms
Lead Channel Setting Sensing Sensitivity: 2 mV
Pulse Gen Model: 2272
Pulse Gen Serial Number: 3920245

## 2024-04-05 ENCOUNTER — Ambulatory Visit: Payer: Self-pay | Admitting: Cardiology

## 2024-04-15 ENCOUNTER — Encounter: Payer: Self-pay | Admitting: Cardiology

## 2024-04-15 ENCOUNTER — Ambulatory Visit: Attending: Cardiology | Admitting: Cardiology

## 2024-04-15 VITALS — BP 132/84 | HR 96 | Ht 63.0 in | Wt 176.0 lb

## 2024-04-15 DIAGNOSIS — I255 Ischemic cardiomyopathy: Secondary | ICD-10-CM | POA: Insufficient documentation

## 2024-04-15 DIAGNOSIS — I25118 Atherosclerotic heart disease of native coronary artery with other forms of angina pectoris: Secondary | ICD-10-CM | POA: Diagnosis not present

## 2024-04-15 DIAGNOSIS — I480 Paroxysmal atrial fibrillation: Secondary | ICD-10-CM

## 2024-04-15 MED ORDER — SACUBITRIL-VALSARTAN 24-26 MG PO TABS: 1.0000 | ORAL_TABLET | Freq: Two times a day (BID) | ORAL | Status: AC

## 2024-04-15 NOTE — Patient Instructions (Addendum)
 Medication Instructions:  INCREASE Entresto  24/25 mg take one tablet by mouth twice daily   *If you need a refill on your cardiac medications before your next appointment, please call your pharmacy*    Follow-Up: At [facility], you and your health needs are our priority.  As part of our continuing mission to provide you with exceptional heart care, our providers are all part of one team.  This team includes your primary Cardiologist (physician) and Advanced Practice Providers or APPs (Physician Assistants and Nurse Practitioners) who all work together to provide you with the care you need, when you need it.  Your next appointment:   1 year(s)  Provider:   Newman JINNY Lawrence, MD

## 2024-04-15 NOTE — Progress Notes (Signed)
 Cardiology Office Note:  .   Date:  04/15/2024  ID:  Colleen [name], DOB 10-25-1965, MRN [id number] PCP: Colleen Senior, MD  [facility] Providers Cardiologist:  Newman Lawrence, MD PCP: Colleen Senior, MD  Chief Complaint  Patient presents with   Cardiomyopathy      History of Present Illness: Colleen [name] is a 58 y.o. female with hypertension, type 1 diabetes mellitus, CAD s/p CABG (LIMA-LAD, SVG-RCA, SVG-OM), and Maze, PAF s/p Afib ablation, ischemic cardiomyopathy with recovered LV and RV function, CKD IIIb  Patient is doing very well from cardiac standpoint.  She denies any chest pain or shortness of breath.  She does have mild leg edema.  She is currently taking Entresto  24-26 mg half tablet twice daily.  Reviewed recent echocardiogram results, details below.    Vitals:   04/15/24 1021  BP: 132/84  Pulse: 96  SpO2: 98%     ROS:  Review of Systems  Cardiovascular:  Positive for leg swelling (Occasional). Negative for chest pain, dyspnea on exertion, palpitations and syncope.     Studies Reviewed: SABRA        EKG 04/15/2024: Normal sinus rhythm Ventricular pacing When compared with ECG of 12-Jan-2023 09:39, Sinus rhythm has replaced atrial pacing    Labs 1-11/2023: Chol 210, TG 87, HDL 59, LDL 44 HbA1C 7.2% Hb 11.4 Cr 1.5 TSH 3.1  06/2023: Cr 1.68.  HbA1C 7.5%  Device monitoring 03/2024: Remote pacemaker interrogation. Presenting Rhythm:A-sensed V-paced. Battery and lead parameters stable with stable capture and sensing. Device programming is appropriate. Continue remote monitoring.   Echocardiogram 01/2024:  1. Left ventricular ejection fraction, by estimation, is 50 to 55%. Left  ventricular ejection fraction by 3D volume is 50 %. The left ventricle has  low normal function. The left ventricle has no regional wall motion  abnormalities. Left ventricular  diastolic parameters are consistent with Grade I diastolic dysfunction   (impaired relaxation).   2. Right ventricular systolic function is mildly reduced. The right  ventricular size is normal. There is normal pulmonary artery systolic  pressure. The estimated right ventricular systolic pressure is 24.7 mmHg.   3. The mitral valve is abnormal. Trivial mitral valve regurgitation.   4. The aortic valve is tricuspid. Aortic valve regurgitation is not  visualized.   5. The inferior vena cava is normal in size with greater than 50%  respiratory variability, suggesting right atrial pressure of 3 mmHg.   Comparison(s): Changes from prior study are noted. 12/19/2022: LVEF 40-45%.   Physical Exam:   Physical Exam Vitals and nursing note reviewed.  Constitutional:      General: She is not in acute distress. Neck:     Vascular: No JVD.  Cardiovascular:     Rate and Rhythm: Normal rate and regular rhythm.     Heart sounds: Normal heart sounds. No murmur heard. Pulmonary:     Effort: Pulmonary effort is normal.     Breath sounds: Normal breath sounds. No wheezing or rales.  Musculoskeletal:     Right lower leg: No edema.     Left lower leg: Edema (Trace) present.      VISIT DIAGNOSES:   ICD-10-CM   1. Ischemic cardiomyopathy  I25.5 EKG 12-Lead    2. Coronary artery disease of native artery of native heart with stable angina pectoris (HCC)  I25.118     3. Paroxysmal atrial fibrillation (HCC)  I48.0        ASSESSMENT AND PLAN: .  Colleen [name] is a 58 y.o. female with hypertension, type 1 diabetes mellitus, CAD s/p CABG (LIMA-LAD, SVG-RCA, SVG-OM), and Maze, PAF s/p Afib ablation, ischemic cardiomyopathy with recovered LV and RV function, CKD IIIb   CAD, ischemic cardiomyopathy: s/p CABGX3 (LIMA-LAD, SVG-RCA, SVG-OM), LAA clip, Maze at [facility] (11/2021) Significant improvement in symptoms, as well as LV/RV function on GDMT. Currently taking Entresto  24-26 mg half tablet twice daily, metoprolol  succinate 12.5 mg on some days. Recommend taking  Entresto  24-26 mg 1 tablet twice daily, and metoprolol  succinate 12.5 mg daily. Lipids very well-controlled on rosuvastatin  10 mg daily. Continue aspirin  81 mg daily.    Persistent A-fib: Prior history with no recent recurrence.   S/p LAA ligation w/no residual leak confirmed on TEE. S/p A-fib/flutter ablation 11/2022. Off Eliquis  with no recurrence of A-fib.     Complete AV block: Normal functioning pacemaker. Has follow up with EP.   CKD stage 3: Creatinine 1.5.  Continue follow-up with PCP/nephrology.  Type 1 DM: Continue insulin  pump.  Follow-up with Dr. Tommas    Meds ordered this encounter  Medications   sacubitril -valsartan  (ENTRESTO ) 24-26 MG    Sig: Take 1 tablet by mouth 2 (two) times daily.    I wished her happy early birthday.  F/u in 1 year  Signed, Newman Colleen Lawrence, MD

## 2024-05-28 ENCOUNTER — Encounter: Payer: Self-pay | Admitting: Cardiology

## 2024-05-30 ENCOUNTER — Telehealth: Payer: Self-pay | Admitting: *Deleted

## 2024-05-30 NOTE — Telephone Encounter (Signed)
 Sorry about the confusion.  Group will be back in the office on Monday.  We will close the loop early part of next week.  Thanks MJP

## 2024-05-30 NOTE — Telephone Encounter (Signed)
 Okay with me, continue to monitor vitals and for any symptoms of dyspnea etc.  Thanks MJP

## 2024-05-30 NOTE — Telephone Encounter (Signed)
 Pt sent a message through MyChart stating she passed out 3 or 4 days after restarting the Entresto  24-26 mg BID.  She has since restarted it at 1/2 tablet twice a day.  She reports her BP was low around 90/66 or sometimes lower. BS was OK  she reports BP is better now and staying in the 110s/70 range.  Pt denies any injury when she passed out but admits to some soreness afterwards.  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  Advised to continue to monitor BP/HR, notify the office with a call if this occurs again and I will forward this information to Dr Elmira for his knowledge.

## 2024-06-02 NOTE — Telephone Encounter (Signed)
 Message reviewed by patient on: Last read by Darice LABOR Sayres at 6:02PM on 05/30/2024.

## 2024-07-03 NOTE — Progress Notes (Signed)
 Remote PPM Transmission

## 2024-07-04 ENCOUNTER — Ambulatory Visit

## 2024-07-04 DIAGNOSIS — I420 Dilated cardiomyopathy: Secondary | ICD-10-CM | POA: Diagnosis not present

## 2024-07-04 LAB — CUP PACEART REMOTE DEVICE CHECK
Battery Remaining Longevity: 52 mo
Battery Remaining Percentage: 59 %
Battery Voltage: 2.99 V
Brady Statistic AP VP Percent: 9.3 %
Brady Statistic AP VS Percent: 1 %
Brady Statistic AS VP Percent: 91 %
Brady Statistic AS VS Percent: 1 %
Brady Statistic RA Percent Paced: 9.2 %
Brady Statistic RV Percent Paced: 99 %
Date Time Interrogation Session: 20251010020021
Implantable Lead Connection Status: 753985
Implantable Lead Connection Status: 753985
Implantable Lead Implant Date: 20220414
Implantable Lead Implant Date: 20220414
Implantable Lead Location: 753859
Implantable Lead Location: 753860
Implantable Lead Model: 1944
Implantable Pulse Generator Implant Date: 20220414
Lead Channel Impedance Value: 440 Ohm
Lead Channel Impedance Value: 580 Ohm
Lead Channel Pacing Threshold Amplitude: 1.25 V
Lead Channel Pacing Threshold Amplitude: 1.25 V
Lead Channel Pacing Threshold Pulse Width: 0.5 ms
Lead Channel Pacing Threshold Pulse Width: 0.8 ms
Lead Channel Sensing Intrinsic Amplitude: 12 mV
Lead Channel Sensing Intrinsic Amplitude: 2.8 mV
Lead Channel Setting Pacing Amplitude: 1.5 V
Lead Channel Setting Pacing Amplitude: 3 V
Lead Channel Setting Pacing Pulse Width: 0.5 ms
Lead Channel Setting Sensing Sensitivity: 2 mV
Pulse Gen Model: 2272
Pulse Gen Serial Number: 3920245

## 2024-07-07 ENCOUNTER — Ambulatory Visit: Payer: Self-pay | Admitting: Cardiology

## 2024-07-07 NOTE — Progress Notes (Signed)
 Remote PPM Transmission

## 2024-08-28 ENCOUNTER — Encounter: Payer: Self-pay | Admitting: Cardiology

## 2024-08-28 NOTE — Telephone Encounter (Signed)
 Please contact patient

## 2024-09-06 ENCOUNTER — Other Ambulatory Visit: Payer: Self-pay | Admitting: Cardiology

## 2024-10-03 ENCOUNTER — Ambulatory Visit

## 2024-10-03 DIAGNOSIS — I420 Dilated cardiomyopathy: Secondary | ICD-10-CM | POA: Diagnosis not present

## 2024-10-06 LAB — CUP PACEART REMOTE DEVICE CHECK
Battery Remaining Longevity: 49 mo
Battery Remaining Percentage: 56 %
Battery Voltage: 2.99 V
Brady Statistic AP VP Percent: 9.7 %
Brady Statistic AP VS Percent: 1 %
Brady Statistic AS VP Percent: 90 %
Brady Statistic AS VS Percent: 1 %
Brady Statistic RA Percent Paced: 9.5 %
Brady Statistic RV Percent Paced: 99 %
Date Time Interrogation Session: 20260109034413
Implantable Lead Connection Status: 753985
Implantable Lead Connection Status: 753985
Implantable Lead Implant Date: 20220414
Implantable Lead Implant Date: 20220414
Implantable Lead Location: 753859
Implantable Lead Location: 753860
Implantable Lead Model: 1944
Implantable Pulse Generator Implant Date: 20220414
Lead Channel Impedance Value: 440 Ohm
Lead Channel Impedance Value: 510 Ohm
Lead Channel Pacing Threshold Amplitude: 1.25 V
Lead Channel Pacing Threshold Amplitude: 1.25 V
Lead Channel Pacing Threshold Pulse Width: 0.5 ms
Lead Channel Pacing Threshold Pulse Width: 0.8 ms
Lead Channel Sensing Intrinsic Amplitude: 12 mV
Lead Channel Sensing Intrinsic Amplitude: 2.3 mV
Lead Channel Setting Pacing Amplitude: 1.5 V
Lead Channel Setting Pacing Amplitude: 3 V
Lead Channel Setting Pacing Pulse Width: 0.5 ms
Lead Channel Setting Sensing Sensitivity: 2 mV
Pulse Gen Model: 2272
Pulse Gen Serial Number: 3920245

## 2024-10-07 ENCOUNTER — Ambulatory Visit: Payer: Self-pay | Admitting: Cardiology

## 2024-10-07 NOTE — Progress Notes (Signed)
 Remote PPM Transmission

## 2025-01-02 ENCOUNTER — Encounter

## 2025-01-07 ENCOUNTER — Ambulatory Visit: Admitting: Student in an Organized Health Care Education/Training Program

## 2025-04-03 ENCOUNTER — Encounter
